# Patient Record
Sex: Female | Born: 1995 | Race: White | Hispanic: No | Marital: Married | Smoking: Never smoker
Health system: Southern US, Community
[De-identification: ages and names within clinical notes are randomized; demographics above are authoritative.]

## PROBLEM LIST (undated history)

## (undated) ENCOUNTER — Inpatient Hospital Stay (HOSPITAL_COMMUNITY): Payer: Self-pay

## (undated) DIAGNOSIS — D649 Anemia, unspecified: Secondary | ICD-10-CM

## (undated) DIAGNOSIS — E282 Polycystic ovarian syndrome: Secondary | ICD-10-CM

## (undated) DIAGNOSIS — E039 Hypothyroidism, unspecified: Secondary | ICD-10-CM

## (undated) DIAGNOSIS — F32A Depression, unspecified: Secondary | ICD-10-CM

## (undated) DIAGNOSIS — O3430 Maternal care for cervical incompetence, unspecified trimester: Secondary | ICD-10-CM

## (undated) DIAGNOSIS — F329 Major depressive disorder, single episode, unspecified: Secondary | ICD-10-CM

## (undated) DIAGNOSIS — O139 Gestational [pregnancy-induced] hypertension without significant proteinuria, unspecified trimester: Secondary | ICD-10-CM

## (undated) DIAGNOSIS — F419 Anxiety disorder, unspecified: Secondary | ICD-10-CM

## (undated) HISTORY — PX: CERVICAL CERCLAGE: SHX1329

## (undated) HISTORY — DX: Anxiety disorder, unspecified: F41.9

## (undated) HISTORY — DX: Depression, unspecified: F32.A

## (undated) HISTORY — PX: OTHER SURGICAL HISTORY: SHX169

## (undated) HISTORY — DX: Major depressive disorder, single episode, unspecified: F32.9

---
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
## 2015-03-19 ENCOUNTER — Encounter
Admit: 2015-03-19 | Disposition: A | Discharge status: Home / Self Care | Payer: Self-pay | Attending: Maternal & Fetal Medicine | Admitting: Maternal & Fetal Medicine

## 2015-03-29 NOTE — Consult Note (Signed)
Referral Information:  Reason for Referral 19 yo gravida 2 para 0111 is referred for preconceptional consultation regarding her history of 26 week preterm delivery.    At [redacted] weeks gestation in that pregnancy, the patient presented to [facility] in [place], [place], with a 2 day history of cramping.  On US, there was funneling with no measurable cervix.  The patient underwent rescue cerclage.  2 days later she ruptured her membranes.  She ultimately delivered at [redacted] weeks gestation.  Her daughter spent 5 and 1/2 months in the NICU, but has little residual - some chronic lung disease.   Referring Physician Dr. Nelson Bingharlie Pickens   Past Obstetrical Hx 03/19/2012 - Sab at "4 weeks" gestation.  No US confirmation.  No D & C 09/20/2015 - spontaneous vaginal delivery at [redacted] weeks gestation of 1'13oz (822 g) female infant.   Home Medications: Medication Instructions Status  metFORMIN 500 mg oral tablet 1 tab(s) orally 2 times a day Active  multivitamin, prenatal 1   once a day Active   Vital Signs/Notes:  Nursing Vital Signs: **Vital Signs.:   21-Apr-16 13:23  Vital Signs Type Routine  Temperature Temperature (F) 98.2  Celsius 36.7  Pulse Pulse 89  Respirations Respirations 18  Systolic BP Systolic BP 123  Diastolic BP (mmHg) Diastolic BP (mmHg) 82  Mean BP 95  Pulse Ox % Pulse Ox % 100  Pulse Ox Activity Level  At rest  Oxygen Delivery Room Air/ 21 %   Perinatal Consult:  PGyn Hx PCOS, oligomenorrhea, hirsutism   Past Medical History cont'd Thyroid disease.  TSH 6.41 on 01/15/2015.  Free T4 was 1.34.  03/03/2015 - TSH was 2.11 Seen by Dr. Tedd SiasSolum yesterday.  TPO antibody ordered  PCOS - Dr. Tedd SiasSolum started the patient on metformin 500 mg po bid yesterday. 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 History of 26 week preterm delivery.  Preterm cervical dilation, but after 2 days of cramping.  PPROM after rescue cerclage 2. PCOS on metformin 3. Obesity with BMI of 32 4. Euthyroid now, followed by Dr. Tedd SiasSolum, with TPO antibody pending   Recommendations 1. History of 26 week preterm delivery.  Preterm cervical dilation, but after 2 days of cramping.  PPROM after rescue cerclage -- I have recommended a uterine cavity evaluation.  Ideally, by sonohysterography (SIS).  Alternatively, by hysterosalpingogram.  I have left a message for Dr. Vergie LivingPickens to call me about the possibility of having this done at [facility].  Alternatively, [facility]. -- In a future pregnancy, I believe that a prophylactic cerclage between 13-14 weeks would be optional.  It is not clear whether cervical incompetence contributed  to her preterm delivery.  She will think about this. -- In a future pregnancy, whether or not she has a prophylactic cerclage, I would recommend serial endovaginal cervical length measurements starting at [redacted] weeks gestation and continuing until [redacted] weeks gestation.   -- We will be happy to see her again during the first trimester of a future pregnancy to review our recommendations -- In a future pregnancy, I would recommend 17OHP injections starting at 16 weeks and continuing until [redacted] weeks  gestation. 2. PCOS on metformin -- I would recommend she remain on metformin after conception -- I would recommend early and routine glucose screening in a future pregnancy 3. Obesity with BMI of 32 -- The patient was counseled by Dr. Tedd Sias and again by me about trying to lose weight  -- During a future pregnancy, weight gain should be limited to 10-15 lbs. 4. Euthyroid now, followed by Dr. Tedd Sias, with TPO antibody pending   Plan:  Comment/Plan Thank you for allowing [place] to participate in her care    Total Time Spent with Patient 45 minutes   >50% of visit spent in couseling/coordination of care yes   Office Use Only [address]  Level 3 ( ) NEW office consult detailed   Coding Description: MATERNAL CONDITIONS/HISTORY INDICATION(S).   History of prior preg w/ preterm labor or complicated by PTL.  Electronic Signatures: Lady Deutscher (MD)  (Signed 21-Apr-16 17:46)  Authored: Referral, Home Medications, Vital Signs/Notes, Consult, Exam, Impression, Plan, Billing, Coding Description   Last Updated: 21-Apr-16 17:46 by Lady Deutscher (MD)

## 2015-05-05 ENCOUNTER — Encounter (HOSPITAL_COMMUNITY): Payer: Self-pay | Admitting: Emergency Medicine

## 2015-05-05 ENCOUNTER — Emergency Department (HOSPITAL_COMMUNITY)
Admission: EM | Admit: 2015-05-05 | Discharge: 2015-05-05 | Disposition: A | Discharge status: Home / Self Care | Payer: Self-pay | Attending: Emergency Medicine | Admitting: Emergency Medicine

## 2015-05-05 DIAGNOSIS — Z8639 Personal history of other endocrine, nutritional and metabolic disease: Secondary | ICD-10-CM | POA: Insufficient documentation

## 2015-05-05 DIAGNOSIS — J039 Acute tonsillitis, unspecified: Secondary | ICD-10-CM | POA: Insufficient documentation

## 2015-05-05 HISTORY — DX: Polycystic ovarian syndrome: E28.2

## 2015-05-05 MED ORDER — AMOXICILLIN 500 MG PO CAPS: 500.0000 mg | ORAL_CAPSULE | Freq: Three times a day (TID) | ORAL | Status: DC

## 2015-05-05 NOTE — ED Provider Notes (Signed)
CSN: [id number]     Arrival date & time 05/05/15  1102 History   First MD Initiated Contact with Patient 05/05/15 1116     Chief Complaint  Patient presents with  . Sore Throat     (Consider location/radiation/quality/duration/timing/severity/associated sxs/prior Treatment) Patient is a 19 y.o. female presenting with pharyngitis. The history is provided by the patient. No language interpreter was used.  Sore Throat This is a new problem. The current episode started yesterday. The problem occurs constantly. The problem has been gradually worsening. Associated symptoms include a sore throat and swollen glands. Nothing aggravates the symptoms. She has tried nothing for the symptoms. The treatment provided moderate relief.    Past Medical History  Diagnosis Date  . PCOS (polycystic ovarian syndrome)    History reviewed. No pertinent past surgical history. History reviewed. No pertinent family history. History  Substance Use Topics  . Smoking status: Never Smoker   . Smokeless tobacco: Not on file  . Alcohol Use: No   OB History    Gravida Para Term Preterm AB TAB SAB Ectopic Multiple Living            1     Review of Systems  HENT: Positive for sore throat.   All other systems reviewed and are negative.     Allergies  Review of patient's allergies indicates no known allergies.  Home Medications   Prior to Admission medications   Medication Sig Start Date End Date Taking? Authorizing Provider  pseudoephedrine-acetaminophen (TYLENOL SINUS) 30-500 MG TABS Take 1 tablet by mouth every 4 (four) hours as needed (sinus congestion).   Yes Historical Provider, MD  amoxicillin (AMOXIL) 500 MG capsule Take 1 capsule (500 mg total) by mouth 3 (three) times daily. 05/05/15   Elson AreasLeslie K Sofia, PA-C   BP 114/79 mmHg  Pulse 118  Temp(Src) 98.3 F (36.8 C) (Oral)  Resp 18  Ht 4\' 11"  (1.499 m)  Wt 150 lb (68.04 kg)  BMI 30.28 kg/m2  SpO2 99%  LMP 04/21/2015 Physical Exam   Constitutional: She is oriented to person, place, and time.  HENT:  Head: Normocephalic.  Right Ear: External ear normal.  Left Ear: External ear normal.  Erythema tonsils and pharynx.  Eyes: Pupils are equal, round, and reactive to light.  Neck: Normal range of motion.  Cardiovascular: Normal rate.   Pulmonary/Chest: Effort normal.  Abdominal: Soft.  Musculoskeletal: Normal range of motion.  Neurological: She is alert and oriented to person, place, and time. She has normal reflexes.  Skin: Skin is warm.  Psychiatric: She has a normal mood and affect.  Nursing note and vitals reviewed.   ED Course  Procedures (including critical care time) Labs Review Labs Reviewed - No data to display  Imaging Review No results found.   EKG Interpretation None      MDM  Co worker had strep   Final diagnoses:  Tonsillitis    amoxicillian AVS    Lonia SkinnerLeslie K North GatesSofia, PA-C 05/05/15 1133  Gilda Creasehristopher J Pollina, MD 05/05/15 1320

## 2015-05-05 NOTE — ED Notes (Signed)
Sore throat since yesterday. With body aches. No NVD, Throat is red and pt sounds sl hoarse.

## 2015-05-05 NOTE — ED Notes (Signed)
PT c/o sore throat x1 day with body aches.

## 2015-05-05 NOTE — Discharge Instructions (Signed)

## 2015-11-25 ENCOUNTER — Emergency Department
Admission: EM | Admit: 2015-11-25 | Discharge: 2015-11-25 | Disposition: A | Discharge status: Home / Self Care | Payer: Self-pay | Attending: Emergency Medicine | Admitting: Emergency Medicine

## 2015-11-25 ENCOUNTER — Emergency Department: Payer: Self-pay

## 2015-11-25 DIAGNOSIS — Z792 Long term (current) use of antibiotics: Secondary | ICD-10-CM | POA: Insufficient documentation

## 2015-11-25 DIAGNOSIS — J029 Acute pharyngitis, unspecified: Secondary | ICD-10-CM

## 2015-11-25 DIAGNOSIS — J40 Bronchitis, not specified as acute or chronic: Secondary | ICD-10-CM | POA: Insufficient documentation

## 2015-11-25 DIAGNOSIS — R51 Headache: Secondary | ICD-10-CM

## 2015-11-25 DIAGNOSIS — R519 Headache, unspecified: Secondary | ICD-10-CM

## 2015-11-25 LAB — POCT RAPID STREP A: STREPTOCOCCUS, GROUP A SCREEN (DIRECT): NEGATIVE

## 2015-11-25 MED ORDER — BUTALBITAL-APAP-CAFFEINE 50-325-40 MG PO TABS
2.0000 | ORAL_TABLET | Freq: Once | ORAL | Status: AC
  Administered 2015-11-25: 2 via ORAL
  Filled 2015-11-25: qty 2

## 2015-11-25 MED ORDER — BUTALBITAL-APAP-CAFFEINE 50-325-40 MG PO TABS: 1.0000 | ORAL_TABLET | Freq: Four times a day (QID) | ORAL | Status: DC | PRN

## 2015-11-25 MED ORDER — BENZONATATE 100 MG PO CAPS: 100.0000 mg | ORAL_CAPSULE | Freq: Four times a day (QID) | ORAL | Status: DC | PRN

## 2015-11-25 MED ORDER — BENZONATATE 100 MG PO CAPS
100.0000 mg | ORAL_CAPSULE | Freq: Once | ORAL | Status: AC
  Administered 2015-11-25: 100 mg via ORAL
  Filled 2015-11-25: qty 1

## 2015-11-25 MED ORDER — ALBUTEROL SULFATE HFA 108 (90 BASE) MCG/ACT IN AERS: 2.0000 | INHALATION_SPRAY | Freq: Four times a day (QID) | RESPIRATORY_TRACT | Status: DC | PRN

## 2015-11-25 MED ORDER — KETOROLAC TROMETHAMINE 60 MG/2ML IM SOLN
60.0000 mg | Freq: Once | INTRAMUSCULAR | Status: AC
  Administered 2015-11-25: 60 mg via INTRAMUSCULAR
  Filled 2015-11-25: qty 2

## 2015-11-25 MED ORDER — IPRATROPIUM-ALBUTEROL 0.5-2.5 (3) MG/3ML IN SOLN
3.0000 mL | Freq: Once | RESPIRATORY_TRACT | Status: AC
  Administered 2015-11-25: 3 mL via RESPIRATORY_TRACT
  Filled 2015-11-25: qty 3

## 2015-11-25 NOTE — ED Notes (Signed)
Pt in with cough, congestion, bodyaches, headaches, since Friday.

## 2015-11-25 NOTE — ED Notes (Signed)
Patient transported to X-ray 

## 2015-11-25 NOTE — Discharge Instructions (Signed)
General Headache Without Cause A headache is pain or discomfort felt around the head or neck area. The specific cause of a headache may not be found. There are many causes and types of headaches. A few common ones are:  Tension headaches.  Migraine headaches.  Cluster headaches.  Chronic daily headaches. HOME CARE INSTRUCTIONS  Watch your condition for any changes. Take these steps to help with your condition: Managing Pain  Take over-the-counter and prescription medicines only as told by your health care provider.  Lie down in a dark, quiet room when you have a headache.  If directed, apply ice to the head and neck area:  Put ice in a plastic bag.  Place a towel between your skin and the bag.  Leave the ice on for 20 minutes, 2-3 times per day.  Use a heating pad or hot shower to apply heat to the head and neck area as told by your health care provider.  Keep lights dim if bright lights bother you or make your headaches worse. Eating and Drinking  Eat meals on a regular schedule.  Limit alcohol use.  Decrease the amount of caffeine you drink, or stop drinking caffeine. General Instructions  Keep all follow-up visits as told by your health care provider. This is important.  Keep a headache journal to help find out what may trigger your headaches. For example, write down:  What you eat and drink.  How much sleep you get.  Any change to your diet or medicines.  Try massage or other relaxation techniques.  Limit stress.  Sit up straight, and do not tense your muscles.  Do not use tobacco products, including cigarettes, chewing tobacco, or e-cigarettes. If you need help quitting, ask your health care provider.  Exercise regularly as told by your health care provider.  Sleep on a regular schedule. Get 7-9 hours of sleep, or the amount recommended by your health care provider. SEEK MEDICAL CARE IF:   Your symptoms are not helped by medicine.  You have a  headache that is different from the usual headache.  You have nausea or you vomit.  You have a fever. SEEK IMMEDIATE MEDICAL CARE IF:   Your headache becomes severe.  You have repeated vomiting.  You have a stiff neck.  You have a loss of vision.  You have problems with speech.  You have pain in the eye or ear.  You have muscular weakness or loss of muscle control.  You lose your balance or have trouble walking.  You feel faint or pass out.  You have confusion.   This information is not intended to replace advice given to you by your health care provider. Make sure you discuss any questions you have with your health care provider.   Document Released: 11/14/2005 Document Revised: 08/05/2015 Document Reviewed: 03/09/2015 Elsevier Interactive Patient Education 2016 Elsevier Inc.  Pharyngitis Pharyngitis is a sore throat (pharynx). There is redness, pain, and swelling of your throat. HOME CARE   Drink enough fluids to keep your pee (urine) clear or pale yellow.  Only take medicine as told by your doctor.  You may get sick again if you do not take medicine as told. Finish your medicines, even if you start to feel better.  Do not take aspirin.  Rest.  Rinse your mouth (gargle) with salt water ( tsp of salt per 1 qt of water) every 1-2 hours. This will help the pain.  If you are not at risk for choking, you  can suck on hard candy or sore throat lozenges. GET HELP IF:  You have large, tender lumps on your neck.  You have a rash.  You cough up green, yellow-brown, or bloody spit. GET HELP RIGHT AWAY IF:   You have a stiff neck.  You drool or cannot swallow liquids.  You throw up (vomit) or are not able to keep medicine or liquids down.  You have very bad pain that does not go away with medicine.  You have problems breathing (not from a stuffy nose). MAKE SURE YOU:   Understand these instructions.  Will watch your condition.  Will get help right away  if you are not doing well or get worse.   This information is not intended to replace advice given to you by your health care provider. Make sure you discuss any questions you have with your health care provider.   Document Released: 05/02/2008 Document Revised: 09/04/2013 Document Reviewed: 07/22/2013 Elsevier Interactive Patient Education 2016 Elsevier Inc.  Upper Respiratory Infection, Adult Most upper respiratory infections (URIs) are caused by a virus. A URI affects the nose, throat, and upper air passages. The most common type of URI is often called "the common cold." HOME CARE   Take medicines only as told by your doctor.  Gargle warm saltwater or take cough drops to comfort your throat as told by your doctor.  Use a warm mist humidifier or inhale steam from a shower to increase air moisture. This may make it easier to breathe.  Drink enough fluid to keep your pee (urine) clear or pale yellow.  Eat soups and other clear broths.  Have a healthy diet.  Rest as needed.  Go back to work when your fever is gone or your doctor says it is okay.  You may need to stay home longer to avoid giving your URI to others.  You can also wear a face mask and wash your hands often to prevent spread of the virus.  Use your inhaler more if you have asthma.  Do not use any tobacco products, including cigarettes, chewing tobacco, or electronic cigarettes. If you need help quitting, ask your doctor. GET HELP IF:  You are getting worse, not better.  Your symptoms are not helped by medicine.  You have chills.  You are getting more short of breath.  You have brown or red mucus.  You have yellow or brown discharge from your nose.  You have pain in your face, especially when you bend forward.  You have a fever.  You have puffy (swollen) neck glands.  You have pain while swallowing.  You have white areas in the back of your throat. GET HELP RIGHT AWAY IF:   You have very bad or  constant:  Headache.  Ear pain.  Pain in your forehead, behind your eyes, and over your cheekbones (sinus pain).  Chest pain.  You have long-lasting (chronic) lung disease and any of the following:  Wheezing.  Long-lasting cough.  Coughing up blood.  A change in your usual mucus.  You have a stiff neck.  You have changes in your:  Vision.  Hearing.  Thinking.  Mood. MAKE SURE YOU:   Understand these instructions.  Will watch your condition.  Will get help right away if you are not doing well or get worse.   This information is not intended to replace advice given to you by your health care provider. Make sure you discuss any questions you have with your health care  provider.   Document Released: 05/02/2008 Document Revised: 03/31/2015 Document Reviewed: 02/19/2014 Elsevier Interactive Patient Education [employer] Elsevier Inc.

## 2015-11-25 NOTE — ED Provider Notes (Signed)
[facility] Emergency Department Provider Note  ____________________________________________  Time seen: Approximately 6:22 AM  I have reviewed the triage vital signs and the nursing notes.   HISTORY  Chief Complaint Cough    HPI Tara Mcmahon is a 19 y.o. female who comes into the hospital today with cough congestion body aches and headaches. The patient reports that she's been having the symptoms since Friday which was approximately 5 days ago. She reports that she's been having coughing that is giving her chest pain when she coughs. The patient is coughing up some intermittent productive yellowish-green sputum. The patient is also had some headache and dizziness with hot flashes. The patient reports that she's felt warm but she has not taken her temperature so she is unsure she's had a fever. The patient has used Chloraseptic to ease her throat painbut has not taken any other medication. The patient reports that her daughter was sick recently. The patient reports that her headache and throat pain or 4 out of 10 in intensity. The patient has also had some shortness of breath with no vomiting. The patient was unsure what to do so she decided to come into the hospital for evaluation.   Past Medical History  Diagnosis Date  . PCOS (polycystic ovarian syndrome)     There are no active problems to display for this patient.   No past surgical history on file.  Current Outpatient Rx  Name  Route  Sig  Dispense  Refill  . albuterol (PROVENTIL HFA;VENTOLIN HFA) 108 (90 Base) MCG/ACT inhaler   Inhalation   Inhale 2 puffs into the lungs every 6 (six) hours as needed.   1 Inhaler   0   . amoxicillin (AMOXIL) 500 MG capsule   Oral   Take 1 capsule (500 mg total) by mouth 3 (three) times daily.   21 capsule   0   . benzonatate (TESSALON PERLES) 100 MG capsule   Oral   Take 1 capsule (100 mg total) by mouth every 6 (six) hours as needed for cough.   15  capsule   0   . butalbital-acetaminophen-caffeine (FIORICET) 50-325-40 MG tablet   Oral   Take 1-2 tablets by mouth every 6 (six) hours as needed for headache.   20 tablet   0   . pseudoephedrine-acetaminophen (TYLENOL SINUS) 30-500 MG TABS   Oral   Take 1 tablet by mouth every 4 (four) hours as needed (sinus congestion).           Allergies Review of patient's allergies indicates no known allergies.  No family history on file.  Social History Social History  Substance Use Topics  . Smoking status: Never Smoker   . Smokeless tobacco: Not on file  . Alcohol Use: No    Review of Systems Constitutional: Hot and cold flashes Eyes: No visual changes. ENT:  sore throat. Cardiovascular: chest pain. Respiratory: Cough and shortness of breath. Gastrointestinal: Nausea with No abdominal pain.   no vomiting.  No diarrhea.  No constipation. Genitourinary: Negative for dysuria. Musculoskeletal: Negative for back pain. Skin: Negative for rash. Neurological: Headache and dizziness  10-point ROS otherwise negative.  ____________________________________________   PHYSICAL EXAM:  VITAL SIGNS: ED Triage Vitals  Enc Vitals Group     BP 11/25/15 0611 120/78 mmHg     Pulse Rate 11/25/15 0611 94     Resp 11/25/15 0611 18     Temp 11/25/15 0611 98.1 F (36.7 C)     Temp Source 11/25/15  [id number] Oral     SpO2 11/25/15 0611 100 %     Weight 11/25/15 0611 160 lb (72.576 kg)     Height 11/25/15 0611 4\' 11"  (1.499 m)     Head Cir --      Peak Flow --      Pain Score 11/25/15 0611 3     Pain Loc --      Pain Edu? --      Excl. in GC? --     Constitutional: Alert and oriented. Well appearing and in mild distress. Eyes: Conjunctivae are normal. PERRL. EOMI. Head: Atraumatic. Nose: No congestion/rhinnorhea. Mouth/Throat: Mucous membranes are moist.  Oropharynx mildly erythematous. Cardiovascular: Normal rate, regular rhythm. Grossly normal heart sounds.  Good peripheral  circulation. Respiratory: Normal respiratory effort.  Coarse expiratory breath sounds throughout all lung fields. Gastrointestinal: Soft and nontender. No distention. Positive bowel sounds Musculoskeletal: No lower extremity tenderness nor edema.   Neurologic:  Normal speech and language.  Skin:  Skin is warm, dry and intact.  Psychiatric: Mood and affect are normal.   ____________________________________________   LABS (all labs ordered are listed, but only abnormal results are displayed)  Labs Reviewed  CULTURE, GROUP A STREP ([facility] ONLY)  POCT RAPID STREP A   ____________________________________________  EKG  None ____________________________________________  RADIOLOGY  Chest x-ray ____________________________________________   PROCEDURES  Procedure(s) performed: None  Critical Care performed: No  ____________________________________________   INITIAL IMPRESSION / ASSESSMENT AND PLAN / ED COURSE  Pertinent labs & imaging results that were available during my care of the patient were reviewed by me and considered in my medical decision making (see chart for details).  This is a 19 year old female who comes into the hospital today with some cough body aches headache and congestion. The patient has not been taking anything for the symptoms. I will give the patient a dose of Toradol, Fioricet and benzonatate. I will also give the patient a breathing treatment as she has some coarse breath sounds. I will check a chest x-ray and a strep test. I will reassess the patient when she's had her medications.  The patient's care will be signed out to Dr. Derrill KayGoodman who will follow up the results of the patient's CXR and reassess her symptoms. ____________________________________________   FINAL CLINICAL IMPRESSION(S) / ED DIAGNOSES  Final diagnoses:  Bronchitis  Pharyngitis  Acute nonintractable headache, unspecified headache type      Rebecka ApleyAllison P Webster, MD 11/25/15  [phone]

## 2015-11-27 LAB — CULTURE, GROUP A STREP (THRC)

## 2015-11-29 NOTE — L&D Delivery Note (Deleted)
Delivery Note At 9:49 PM a viable female was delivered via Vaginal, Spontaneous Delivery (Presentation: cephalic; OA).  APGAR: 6, 7; weight pending.   Placenta status: intact, delivered spontaneously.  Cord: 3 vessel with the following complications: none.  Cord pH: 7.11  Anesthesia: Epidural; local during repair Episiotomy: None Lacerations: 1st degree;Vaginal Suture Repair: 3.0 vicryl Est. Blood Loss (mL): 150  Mom to postpartum.  Baby to Couplet care / Skin to Skin.  Tara Mcmahon 08/30/2016, 10:40 PM

## 2015-11-29 NOTE — L&D Delivery Note (Signed)
Delivery Note At 9:49 PM a viable female was delivered via Vaginal, Spontaneous Delivery (Presentation: cephalic; OA).  APGAR: 6, 7; weight pending .   Placenta status: spontaneous, intact.  Cord: 3 vessel with the following complications: none.  Cord pH: 7.11  Anesthesia: Epidural Episiotomy: None Lacerations: 1st degree;Vaginal Suture Repair: 3.0 vicryl Est. Blood Loss (mL): 150  Mom to postpartum.  Baby to Couplet care / Skin to Skin.  Jinny BlossomKaty D Mayo 08/30/2016, 10:45 PM   OB FELLOW DELIVERY ATTESTATION  I was gloved and present for the delivery in its entirety, and I agree with the above resident's note.    Jen MowElizabeth Mumaw, DO OB Fellow

## 2015-12-27 ENCOUNTER — Emergency Department (HOSPITAL_COMMUNITY)
Admission: EM | Admit: 2015-12-27 | Discharge: 2015-12-27 | Disposition: A | Discharge status: Home / Self Care | Payer: Self-pay | Attending: Emergency Medicine | Admitting: Emergency Medicine

## 2015-12-27 ENCOUNTER — Encounter (HOSPITAL_COMMUNITY): Payer: Self-pay

## 2015-12-27 ENCOUNTER — Emergency Department (HOSPITAL_COMMUNITY): Payer: Self-pay

## 2015-12-27 DIAGNOSIS — R102 Pelvic and perineal pain: Secondary | ICD-10-CM

## 2015-12-27 DIAGNOSIS — Z3A01 Less than 8 weeks gestation of pregnancy: Secondary | ICD-10-CM | POA: Insufficient documentation

## 2015-12-27 DIAGNOSIS — R079 Chest pain, unspecified: Secondary | ICD-10-CM | POA: Insufficient documentation

## 2015-12-27 DIAGNOSIS — R51 Headache: Secondary | ICD-10-CM | POA: Insufficient documentation

## 2015-12-27 DIAGNOSIS — R112 Nausea with vomiting, unspecified: Secondary | ICD-10-CM | POA: Insufficient documentation

## 2015-12-27 DIAGNOSIS — R109 Unspecified abdominal pain: Secondary | ICD-10-CM | POA: Insufficient documentation

## 2015-12-27 DIAGNOSIS — O26899 Other specified pregnancy related conditions, unspecified trimester: Secondary | ICD-10-CM

## 2015-12-27 DIAGNOSIS — R1031 Right lower quadrant pain: Secondary | ICD-10-CM

## 2015-12-27 DIAGNOSIS — Z79899 Other long term (current) drug therapy: Secondary | ICD-10-CM | POA: Insufficient documentation

## 2015-12-27 DIAGNOSIS — O200 Threatened abortion: Secondary | ICD-10-CM | POA: Insufficient documentation

## 2015-12-27 DIAGNOSIS — M545 Low back pain: Secondary | ICD-10-CM | POA: Insufficient documentation

## 2015-12-27 DIAGNOSIS — Z8639 Personal history of other endocrine, nutritional and metabolic disease: Secondary | ICD-10-CM | POA: Insufficient documentation

## 2015-12-27 LAB — CBC WITH DIFFERENTIAL/PLATELET
BASOS PCT: 0 %
Basophils Absolute: 0 10*3/uL (ref 0.0–0.1)
Eosinophils Absolute: 0.1 10*3/uL (ref 0.0–0.7)
Eosinophils Relative: 1 %
HCT: 35.8 % — ABNORMAL LOW (ref 36.0–46.0)
Hemoglobin: 12.4 g/dL (ref 12.0–15.0)
Lymphocytes Relative: 20 %
Lymphs Abs: 1.9 10*3/uL (ref 0.7–4.0)
MCH: 29.2 pg (ref 26.0–34.0)
MCHC: 34.6 g/dL (ref 30.0–36.0)
MCV: 84.4 fL (ref 78.0–100.0)
MONO ABS: 0.6 10*3/uL (ref 0.1–1.0)
MONOS PCT: 6 %
NEUTROS PCT: 73 %
Neutro Abs: 7.1 10*3/uL (ref 1.7–7.7)
Platelets: 325 10*3/uL (ref 150–400)
RBC: 4.24 MIL/uL (ref 3.87–5.11)
RDW: 12.2 % (ref 11.5–15.5)
WBC: 9.7 10*3/uL (ref 4.0–10.5)

## 2015-12-27 LAB — URINALYSIS, ROUTINE W REFLEX MICROSCOPIC
Bilirubin Urine: NEGATIVE
GLUCOSE, UA: NEGATIVE mg/dL
Hgb urine dipstick: NEGATIVE
KETONES UR: NEGATIVE mg/dL
Leukocytes, UA: NEGATIVE
Nitrite: NEGATIVE
PROTEIN: NEGATIVE mg/dL
Specific Gravity, Urine: 1.02 (ref 1.005–1.030)
pH: 7 (ref 5.0–8.0)

## 2015-12-27 LAB — BASIC METABOLIC PANEL
Anion gap: 7 (ref 5–15)
BUN: 15 mg/dL (ref 6–20)
CALCIUM: 9 mg/dL (ref 8.9–10.3)
CO2: 25 mmol/L (ref 22–32)
Chloride: 105 mmol/L (ref 101–111)
Creatinine, Ser: 0.73 mg/dL (ref 0.44–1.00)
GFR calc non Af Amer: >60 mL/min (ref >60)
GLUCOSE: 112 mg/dL — AB (ref 65–99)
Potassium: 3.7 mmol/L (ref 3.5–5.1)
Sodium: 137 mmol/L (ref 135–145)

## 2015-12-27 LAB — HCG, QUANTITATIVE, PREGNANCY: HCG, BETA CHAIN, QUANT, S: 121 m[IU]/mL — AB (ref <5)

## 2015-12-27 LAB — POC URINE PREG, ED: Preg Test, Ur: POSITIVE — AB

## 2015-12-27 NOTE — Discharge Instructions (Signed)
Return for new or worse symptoms. Make an appointment to follow-up with OB/GYN. Continue prenatal vitamins.

## 2015-12-27 NOTE — ED Provider Notes (Signed)
CSN: [id number]     Arrival date & time 12/27/15  1021 History  By signing my name below, I, Gonzella Lex, attest that this documentation has been prepared under the direction and in the presence of Vanetta Mulders, MD. Electronically Signed: Gonzella Lex, Scribe. 12/27/2015. 11:12 AM.   Chief Complaint  Patient presents with  . Abdominal Pain   The history is provided by the patient. No language interpreter was used.   HPI Comments: Tara Mcmahon is a 20 y.o. female with a hx of PCOS, who is currently about [redacted] weeks pregnant and has a GPA score of 3-1-1 who presents to the Emergency Department complaining of sudden onset, 7/10, sharp RLQ abdominal pain which began three days ago. Pt also notes associated nausea, vomiting, HA, chest pain, and mild lower back pain. Her LNMP was  December 3rd, 2016. Pt reported to the ED at [facility] two days ago where they ordered a quantitative HCG which was read to be 70. They did not perform an ultrasound. Pt was given medication for her nausea which she does not take. She has instead been eating mints which mildly relieve her nausea. She also notes that she has been taking prenatal vitamins. Pt has not seen an OBGYN for her PCOS due to lack of insurance. She denies fever, cough, sore throat, rhinorrhea, vaginal discharge, vaginal bleeding, rash, hematuria, dysuria, leg swelling, SOB, and diarrhea.   Past Medical History  Diagnosis Date  . PCOS (polycystic ovarian syndrome)    History reviewed. No pertinent past surgical history. No family history on file. Social History  Substance Use Topics  . Smoking status: Never Smoker   . Smokeless tobacco: None  . Alcohol Use: No   OB History    Gravida Para Term Preterm AB TAB SAB Ectopic Multiple Living   Review of Systems  Constitutional: Negative for fever and chills.  HENT: Negative for rhinorrhea and sore throat.   Eyes: Negative for visual disturbance.  Respiratory:  Negative for cough and shortness of breath.   Cardiovascular: Positive for chest pain. Negative for leg swelling.  Gastrointestinal: Positive for nausea, vomiting and abdominal pain. Negative for diarrhea.  Genitourinary: Negative for dysuria, hematuria, vaginal bleeding and vaginal discharge.  Musculoskeletal: Positive for back pain.  Skin: Negative for rash.  Neurological: Positive for headaches.  Hematological: Does not bruise/bleed easily.  Psychiatric/Behavioral: Negative for confusion.  All other systems reviewed and are negative.  Allergies  Review of patient's allergies indicates no known allergies.  Home Medications   Prior to Admission medications   Medication Sig Start Date End Date Taking? Authorizing Provider  Prenatal Vit-Fe Fumarate-FA (PRENATAL MULTIVITAMIN) TABS tablet Take 1 tablet by mouth daily at 12 noon.   Yes Historical Provider, MD  albuterol (PROVENTIL HFA;VENTOLIN HFA) 108 (90 Base) MCG/ACT inhaler Inhale 2 puffs into the lungs every 6 (six) hours as needed. Patient not taking: Reported on 12/27/2015 11/25/15   Rebecka Apley, MD  benzonatate (TESSALON PERLES) 100 MG capsule Take 1 capsule (100 mg total) by mouth every 6 (six) hours as needed for cough. Patient not taking: Reported on 12/27/2015 11/25/15   Rebecka Apley, MD  butalbital-acetaminophen-caffeine (FIORICET) [phone] MG tablet Take 1-2 tablets by mouth every 6 (six) hours as needed for headache. Patient not taking: Reported on 12/27/2015 11/25/15 11/24/16  Rebecka Apley, MD   BP 128/69 mmHg  Pulse 102  Temp(Src) 98.5 F (  36.9 C) (Oral)  Resp 18  Ht  (1.499 m)  Wt 72.576 kg  BMI 32.30 kg/m2  SpO2 99%  LMP 10/31/2015 Physical Exam  Constitutional: She is oriented to person, place, and time. She appears well-developed and well-nourished. No distress.  HENT:  Head: Normocephalic and atraumatic.  Mouth/Throat: Oropharynx is clear and moist.  Mucous membranes are moist.   Eyes:  Conjunctivae and EOM are normal. Pupils are equal, round, and reactive to light. No scleral icterus.  Cardiovascular: Normal rate, regular rhythm and normal heart sounds.   Pulmonary/Chest: Effort normal and breath sounds normal.  Lungs clear bilaterally  Abdominal: Soft. Bowel sounds are normal. She exhibits no distension. There is no tenderness.  Musculoskeletal: She exhibits no edema.  No swelling in ankles  Neurological: She is alert and oriented to person, place, and time. She has normal reflexes. No cranial nerve deficit. She exhibits normal muscle tone. Coordination normal.  Skin: Skin is warm and dry.  Psychiatric: She has a normal mood and affect.  Nursing note and vitals reviewed.   ED Course  Procedures  DIAGNOSTIC STUDIES:    Oxygen Saturation is 99% on RA, normal by my interpretation.   COORDINATION OF CARE:  10:36 AM Will order ultra sound and quantitative HCG. Discussed treatment plan with pt at bedside and pt agreed to plan.   Results for orders placed or performed during the hospital encounter of 12/27/15  Urinalysis, Routine w reflex microscopic (not at [facility])  Result Value Ref Range   Color, Urine YELLOW YELLOW   APPearance CLEAR CLEAR   Specific Gravity, Urine 1.020 1.005 - 1.030   pH 7.0 5.0 - 8.0   Glucose, UA NEGATIVE NEGATIVE mg/dL   Hgb urine dipstick NEGATIVE NEGATIVE   Bilirubin Urine NEGATIVE NEGATIVE   Ketones, ur NEGATIVE NEGATIVE mg/dL   Protein, ur NEGATIVE NEGATIVE mg/dL   Nitrite NEGATIVE NEGATIVE   Leukocytes, UA NEGATIVE NEGATIVE  Basic metabolic panel  Result Value Ref Range   Sodium 137 135 - 145 mmol/L   Potassium 3.7 3.5 - 5.1 mmol/L   Chloride 105 101 - 111 mmol/L   CO2 25 22 - 32 mmol/L   Glucose, Bld 112 (H) 65 - 99 mg/dL   BUN 15 6 - 20 mg/dL   Creatinine, Ser 1.61 0.44 - 1.00 mg/dL   Calcium 9.0 8.9 - 09.6 mg/dL   GFR calc non Af Amer >60 >60 mL/min   GFR calc Af Amer >60 >60 mL/min   Anion gap 7 5 - 15  CBC with  Differential/Platelet  Result Value Ref Range   WBC 9.7 4.0 - 10.5 K/uL   RBC 4.24 3.87 - 5.11 MIL/uL   Hemoglobin 12.4 12.0 - 15.0 g/dL   HCT 04.5 (L) 40.9 - 81.1 %   MCV 84.4 78.0 - 100.0 fL   MCH 29.2 26.0 - 34.0 pg   MCHC 34.6 30.0 - 36.0 g/dL   RDW 91.4 78.2 - 95.6 %   Platelets 325 150 - 400 K/uL   Neutrophils Relative % 73 %   Neutro Abs 7.1 1.7 - 7.7 K/uL   Lymphocytes Relative 20 %   Lymphs Abs 1.9 0.7 - 4.0 K/uL   Monocytes Relative 6 %   Monocytes Absolute 0.6 0.1 - 1.0 K/uL   Eosinophils Relative 1 %   Eosinophils Absolute 0.1 0.0 - 0.7 K/uL   Basophils Relative 0 %   Basophils Absolute 0.0 0.0 - 0.1 K/uL  hCG, quantitative, pregnancy  Result Value  Ref Range   hCG, Beta Chain, Quant, S 121 (H) <5 mIU/mL  POC urine preg, ED (not at [facility])  Result Value Ref Range   Preg Test, Ur POSITIVE (A) NEGATIVE   US Ob Comp Less 14 Wks  12/27/2015  CLINICAL DATA:  Positive pregnancy test, right lower quadrant pain for 3 days, quantitative beta HCG 121. EXAM: OBSTETRIC <14 WK [place] AND TRANSVAGINAL OB US TECHNIQUE: Both transabdominal and transvaginal ultrasound examinations were performed for complete evaluation of the gestation as well as the maternal uterus, adnexal regions, and pelvic cul-de-sac. Transvaginal technique was performed to assess early pregnancy. COMPARISON:  None. FINDINGS: Intrauterine gestational sac: Not visualized Yolk sac:  Not visualized Embryo:  Not visualized Cardiac Activity: Not detected Subchorionic hemorrhage:  None visualized. Maternal uterus/adnexae: No visualized intrauterine pregnancy or gestational sac. Endometrium is thickened measured 22 mm. Ovaries appear normal. Right ovary measures 4.1 x 2.7 x 2.6 cm. Left ovary measures 3.4 x 2.3 x 2.0 cm. Small amount of free fluid in the cul-de-sac. No definite adnexal abnormality. IMPRESSION: No visualized intrauterine pregnancy or gestational sac. Thickened endometrium measuring 22 mm. Normal ovaries without adnexal  abnormality. Trace pelvic free fluid. Electronically Signed   By: Judie Petit.  Shick M.D.   On: 12/27/2015 12:12   US Ob Transvaginal  12/27/2015  CLINICAL DATA:  Positive pregnancy test, right lower quadrant pain for 3 days, quantitative beta HCG 121. EXAM: OBSTETRIC <14 WK [place] AND TRANSVAGINAL OB US TECHNIQUE: Both transabdominal and transvaginal ultrasound examinations were performed for complete evaluation of the gestation as well as the maternal uterus, adnexal regions, and pelvic cul-de-sac. Transvaginal technique was performed to assess early pregnancy. COMPARISON:  None. FINDINGS: Intrauterine gestational sac: Not visualized Yolk sac:  Not visualized Embryo:  Not visualized Cardiac Activity: Not detected Subchorionic hemorrhage:  None visualized. Maternal uterus/adnexae: No visualized intrauterine pregnancy or gestational sac. Endometrium is thickened measured 22 mm. Ovaries appear normal. Right ovary measures 4.1 x 2.7 x 2.6 cm. Left ovary measures 3.4 x 2.3 x 2.0 cm. Small amount of free fluid in the cul-de-sac. No definite adnexal abnormality. IMPRESSION: No visualized intrauterine pregnancy or gestational sac. Thickened endometrium measuring 22 mm. Normal ovaries without adnexal abnormality. Trace pelvic free fluid. Electronically Signed   By: Judie Petit.  Shick M.D.   On: 12/27/2015 12:12     MDM   Final diagnoses:  Threatened miscarriage in early pregnancy    The patient's quantitative hCG from [facility] from 2 days ago was 70. Today it's up to 121 suggestive of a developing pregnancy very early on. No evidence of any embryo visualization on ultrasound. Patient currently nontoxic no acute distress. Does have abdominal pain but has no abdominal tenderness. No vaginal bleeding. Patient given precautions referral to OB/GYN. Patient's already on prenatal vitamins. Patient understands an ectopic pregnancy has not been ruled out.  I personally performed the services described in this documentation, which was  scribed in my presence. The recorded information has been reviewed and is accurate.      Vanetta Mulders, MD 12/27/15 1319

## 2015-12-27 NOTE — ED Notes (Signed)
Pt reports that she is [redacted] weeks pregnant and started having sharp right sided abdominal pain. No bleeding noted . Vomiting and nausea present

## 2015-12-31 ENCOUNTER — Emergency Department (HOSPITAL_COMMUNITY)
Admission: EM | Admit: 2015-12-31 | Discharge: 2015-12-31 | Disposition: A | Discharge status: Home / Self Care | Payer: Medicaid Other | Attending: Emergency Medicine | Admitting: Emergency Medicine

## 2015-12-31 ENCOUNTER — Encounter (HOSPITAL_COMMUNITY): Payer: Self-pay | Admitting: *Deleted

## 2015-12-31 DIAGNOSIS — Z79899 Other long term (current) drug therapy: Secondary | ICD-10-CM | POA: Insufficient documentation

## 2015-12-31 DIAGNOSIS — Z8639 Personal history of other endocrine, nutritional and metabolic disease: Secondary | ICD-10-CM | POA: Insufficient documentation

## 2015-12-31 DIAGNOSIS — Z3201 Encounter for pregnancy test, result positive: Secondary | ICD-10-CM | POA: Insufficient documentation

## 2015-12-31 DIAGNOSIS — Z09 Encounter for follow-up examination after completed treatment for conditions other than malignant neoplasm: Secondary | ICD-10-CM | POA: Diagnosis present

## 2015-12-31 DIAGNOSIS — Z3490 Encounter for supervision of normal pregnancy, unspecified, unspecified trimester: Secondary | ICD-10-CM

## 2015-12-31 LAB — HCG, QUANTITATIVE, PREGNANCY: HCG, BETA CHAIN, QUANT, S: 562 m[IU]/mL — AB (ref <5)

## 2015-12-31 NOTE — ED Provider Notes (Signed)
CSN: [id number]     Arrival date & time 12/31/15  1355 History   First MD Initiated Contact with Patient 12/31/15 1412     Chief Complaint  Patient presents with  . Follow-up     (Consider location/radiation/quality/duration/timing/severity/associated sxs/prior Treatment) The history is provided by the patient.   20 year old female seen by me on January 29. At that time patient was diagnosed with early pregnancy but ultrasound was not able to distinguish interuterine pregnancy. So patient will have precautions for possible ectopic or threatened miscarriage. Patient's symptoms have resolved no abdominal pain at this point in time. No vaginal bleeding. Patient tried to get follow-up with her quantitative hCG by OB/GYN today and could not get in so she came here to have that checked.  Past Medical History  Diagnosis Date  . PCOS (polycystic ovarian syndrome)    History reviewed. No pertinent past surgical history. History reviewed. No pertinent family history. Social History  Substance Use Topics  . Smoking status: Never Smoker   . Smokeless tobacco: None  . Alcohol Use: No   OB History    Gravida Para Term Preterm AB TAB SAB Ectopic Multiple Living   Review of Systems  Constitutional: Negative for fever.  HENT: Negative for congestion.   Eyes: Negative for visual disturbance.  Respiratory: Negative for shortness of breath.   Cardiovascular: Negative for chest pain.  Gastrointestinal: Negative for nausea, vomiting and abdominal pain.  Genitourinary: Negative for dysuria, vaginal bleeding and vaginal discharge.  Musculoskeletal: Negative for back pain.  Hematological: Does not bruise/bleed easily.  Psychiatric/Behavioral: Negative for confusion.      Allergies  Review of patient's allergies indicates no known allergies.  Home Medications   Prior to Admission medications   Medication Sig Start Date End Date Taking? Authorizing Provider  Prenatal Vit-Fe  Fumarate-FA (PRENATAL MULTIVITAMIN) TABS tablet Take 1 tablet by mouth daily at 12 noon.   Yes Historical Provider, MD  albuterol (PROVENTIL HFA;VENTOLIN HFA) 108 (90 Base) MCG/ACT inhaler Inhale 2 puffs into the lungs every 6 (six) hours as needed. Patient not taking: Reported on 12/27/2015 11/25/15   Rebecka Apley, MD  benzonatate (TESSALON PERLES) 100 MG capsule Take 1 capsule (100 mg total) by mouth every 6 (six) hours as needed for cough. Patient not taking: Reported on 12/27/2015 11/25/15   Rebecka Apley, MD  butalbital-acetaminophen-caffeine (FIORICET) [phone] MG tablet Take 1-2 tablets by mouth every 6 (six) hours as needed for headache. Patient not taking: Reported on 12/27/2015 11/25/15 11/24/16  Rebecka Apley, MD   BP 122/65 mmHg  Pulse 75  Temp(Src) 98.1 F (36.7 C) (Oral)  Resp 16  Ht  (1.499 m)  Wt 72.576 kg  BMI 32.30 kg/m2  SpO2 100%  LMP 10/31/2015 Physical Exam  Constitutional: She is oriented to person, place, and time. She appears well-developed and well-nourished. No distress.  HENT:  Head: Normocephalic and atraumatic.  Mouth/Throat: Oropharynx is clear and moist.  Eyes: Conjunctivae and EOM are normal. Pupils are equal, round, and reactive to light.  Neck: Normal range of motion. Neck supple.  Cardiovascular: Normal rate, regular rhythm and normal heart sounds.   Pulmonary/Chest: Effort normal and breath sounds normal. No respiratory distress.  Abdominal: Soft. Bowel sounds are normal. There is no tenderness.  Musculoskeletal: Normal range of motion.  Neurological: She is alert and oriented to person, place, and time. No cranial nerve deficit. She exhibits normal  muscle tone. Coordination normal.  Skin: Skin is warm.  Nursing note and vitals reviewed.   ED Course  Procedures (including critical care time) Labs Review Labs Reviewed  HCG, QUANTITATIVE, PREGNANCY - Abnormal; Notable for the following:    hCG, Beta Chain, Quant, S 562 (*)     All other components within normal limits   Results for orders placed or performed during the hospital encounter of 12/31/15  hCG, quantitative, pregnancy  Result Value Ref Range   hCG, Beta Chain, Quant, S 562 (H) <5 mIU/mL     Imaging Review No results found. I have personally reviewed and evaluated these images and lab results as part of my medical decision-making.   EKG Interpretation None      MDM   Final diagnoses:  Pregnancy    The patient seen by me a few days ago. Patient returns here for follow-up quantitative hCG was unable to get into OB/GYN office locally. Patient without any vaginal bleeding no significant abdominal pain.  Repeat quantitative hCG shows that it's now 562 was 100 previously all suggestive of developing pregnancy early on. Patient here without any concerns clinically now for threatened miscarriage.    Vanetta Mulders, MD 12/31/15 1726

## 2015-12-31 NOTE — Discharge Instructions (Signed)
Principal hormone number is increasing. Suggestive of early developing pregnancy. As we discussed before return for any significant vaginal bleeding or development of severe abdominal pain. Make appointment to follow-up with the OB/GYN. Continue prenatal vitamins.

## 2015-12-31 NOTE — ED Notes (Signed)
Pt was told to come here for HCG rechecked, here on Sunday and had HCG checked then and was told it was low.  Denies pain or vaginal discharge or vaginal bleeding

## 2016-01-06 ENCOUNTER — Encounter (HOSPITAL_COMMUNITY): Payer: Self-pay | Admitting: Emergency Medicine

## 2016-01-06 ENCOUNTER — Emergency Department (HOSPITAL_COMMUNITY)
Admission: EM | Admit: 2016-01-06 | Discharge: 2016-01-06 | Disposition: A | Discharge status: Home / Self Care | Payer: Medicaid Other | Attending: Emergency Medicine | Admitting: Emergency Medicine

## 2016-01-06 DIAGNOSIS — Z3490 Encounter for supervision of normal pregnancy, unspecified, unspecified trimester: Secondary | ICD-10-CM

## 2016-01-06 DIAGNOSIS — R1030 Lower abdominal pain, unspecified: Secondary | ICD-10-CM | POA: Insufficient documentation

## 2016-01-06 DIAGNOSIS — Z3A01 Less than 8 weeks gestation of pregnancy: Secondary | ICD-10-CM | POA: Insufficient documentation

## 2016-01-06 DIAGNOSIS — R630 Anorexia: Secondary | ICD-10-CM | POA: Insufficient documentation

## 2016-01-06 DIAGNOSIS — O210 Mild hyperemesis gravidarum: Secondary | ICD-10-CM | POA: Diagnosis present

## 2016-01-06 DIAGNOSIS — R112 Nausea with vomiting, unspecified: Secondary | ICD-10-CM

## 2016-01-06 DIAGNOSIS — R102 Pelvic and perineal pain: Secondary | ICD-10-CM | POA: Diagnosis not present

## 2016-01-06 DIAGNOSIS — R6883 Chills (without fever): Secondary | ICD-10-CM | POA: Diagnosis not present

## 2016-01-06 DIAGNOSIS — Z79899 Other long term (current) drug therapy: Secondary | ICD-10-CM | POA: Insufficient documentation

## 2016-01-06 DIAGNOSIS — R197 Diarrhea, unspecified: Secondary | ICD-10-CM | POA: Insufficient documentation

## 2016-01-06 DIAGNOSIS — R39198 Other difficulties with micturition: Secondary | ICD-10-CM | POA: Diagnosis not present

## 2016-01-06 DIAGNOSIS — O9989 Other specified diseases and conditions complicating pregnancy, childbirth and the puerperium: Secondary | ICD-10-CM | POA: Diagnosis not present

## 2016-01-06 DIAGNOSIS — Z8639 Personal history of other endocrine, nutritional and metabolic disease: Secondary | ICD-10-CM | POA: Insufficient documentation

## 2016-01-06 DIAGNOSIS — R34 Anuria and oliguria: Secondary | ICD-10-CM | POA: Diagnosis not present

## 2016-01-06 LAB — BASIC METABOLIC PANEL
ANION GAP: 10 (ref 5–15)
BUN: 13 mg/dL (ref 6–20)
CALCIUM: 8.9 mg/dL (ref 8.9–10.3)
CO2: 23 mmol/L (ref 22–32)
Chloride: 105 mmol/L (ref 101–111)
Creatinine, Ser: 0.77 mg/dL (ref 0.44–1.00)
GFR calc Af Amer: >60 mL/min (ref >60)
GLUCOSE: 91 mg/dL (ref 65–99)
Potassium: 3.6 mmol/L (ref 3.5–5.1)
Sodium: 138 mmol/L (ref 135–145)

## 2016-01-06 LAB — CBC WITH DIFFERENTIAL/PLATELET
BASOS ABS: 0 10*3/uL (ref 0.0–0.1)
Basophils Relative: 0 %
EOS PCT: 0 %
Eosinophils Absolute: 0 10*3/uL (ref 0.0–0.7)
HEMATOCRIT: 38.8 % (ref 36.0–46.0)
HEMOGLOBIN: 13.5 g/dL (ref 12.0–15.0)
LYMPHS PCT: 10 %
Lymphs Abs: 1.2 10*3/uL (ref 0.7–4.0)
MCH: 29.2 pg (ref 26.0–34.0)
MCHC: 34.8 g/dL (ref 30.0–36.0)
MCV: 83.8 fL (ref 78.0–100.0)
MONO ABS: 0.8 10*3/uL (ref 0.1–1.0)
MONOS PCT: 7 %
NEUTROS ABS: 9.8 10*3/uL — AB (ref 1.7–7.7)
Neutrophils Relative %: 83 %
Platelets: 333 10*3/uL (ref 150–400)
RBC: 4.63 MIL/uL (ref 3.87–5.11)
RDW: 12.3 % (ref 11.5–15.5)
WBC: 11.9 10*3/uL — ABNORMAL HIGH (ref 4.0–10.5)

## 2016-01-06 LAB — URINALYSIS, ROUTINE W REFLEX MICROSCOPIC
GLUCOSE, UA: NEGATIVE mg/dL
Hgb urine dipstick: NEGATIVE
KETONES UR: 40 mg/dL — AB
Leukocytes, UA: NEGATIVE
Nitrite: NEGATIVE
PH: 6 (ref 5.0–8.0)
Protein, ur: NEGATIVE mg/dL
Specific Gravity, Urine: >1.03 — ABNORMAL HIGH (ref 1.005–1.030)

## 2016-01-06 LAB — HCG, QUANTITATIVE, PREGNANCY: hCG, Beta Chain, Quant, S: 4330 m[IU]/mL — ABNORMAL HIGH (ref <5)

## 2016-01-06 MED ORDER — PROMETHAZINE HCL 25 MG/ML IJ SOLN
12.5000 mg | Freq: Once | INTRAMUSCULAR | Status: AC
Start: 2016-01-06 — End: 2016-01-06
  Administered 2016-01-06: 12.5 mg via INTRAVENOUS
  Filled 2016-01-06: qty 1

## 2016-01-06 MED ORDER — SODIUM CHLORIDE 0.9 % IV BOLUS (SEPSIS)
1000.0000 mL | Freq: Once | INTRAVENOUS | Status: AC
Start: 2016-01-06 — End: 2016-01-06
  Administered 2016-01-06: 1000 mL via INTRAVENOUS

## 2016-01-06 MED ORDER — PROMETHAZINE HCL 25 MG PO TABS: 25.0000 mg | ORAL_TABLET | Freq: Four times a day (QID) | ORAL | Status: DC | PRN

## 2016-01-06 NOTE — ED Notes (Signed)
Patient given discharge instruction, verbalized understand. IV removed, band aid applied. Patient ambulatory out of the department.  

## 2016-01-06 NOTE — ED Notes (Addendum)
Patient complaining of vomiting starting at 0200 this morning. Patient states she is approximately [redacted] weeks pregnant. Patient states she is unable to keep any fluids down.

## 2016-01-06 NOTE — ED Provider Notes (Signed)
CSN: [id number]     Arrival date & time 01/06/16  1519 History   First MD Initiated Contact with Patient 01/06/16 1640     Chief Complaint  Patient presents with  . Emesis      Patient is a 20 y.o. female presenting with vomiting. The history is provided by the patient.  Emesis Associated symptoms: abdominal pain and chills    patient presents with nausea vomiting diarrhea. Began around 2 in the morning last night. She has lower abdominal cramping also. States she has had decreased oral intake. States she's  Had decreased urination. She is also by weeks pregnant. She was seen in the ER around 1 week ago twice. She had initial beta of 100 and an ultrasound that did not show anything intrauterine. She then had a repeat beta a few days later of 530. She states the GI bug is gone through her house.  Past Medical History  Diagnosis Date  . PCOS (polycystic ovarian syndrome)    History reviewed. No pertinent past surgical history. History reviewed. No pertinent family history. Social History  Substance Use Topics  . Smoking status: Never Smoker   . Smokeless tobacco: None  . Alcohol Use: No   OB History    Gravida Para Term Preterm AB TAB SAB Ectopic Multiple Living   Review of Systems  Constitutional: Positive for chills and appetite change.  Gastrointestinal: Positive for nausea, vomiting and abdominal pain.  Genitourinary: Positive for decreased urine volume and difficulty urinating. Negative for vaginal bleeding and menstrual problem.        Pelvic cramping      Allergies  Review of patient's allergies indicates no known allergies.  Home Medications   Prior to Admission medications   Medication Sig Start Date End Date Taking? Authorizing Provider  Prenatal Vit-Fe Fumarate-FA (PRENATAL MULTIVITAMIN) TABS tablet Take 1 tablet by mouth daily.    Yes Historical Provider, MD  albuterol (PROVENTIL HFA;VENTOLIN HFA) 108 (90 Base) MCG/ACT inhaler Inhale 2 puffs  into the lungs every 6 (six) hours as needed. Patient not taking: Reported on 12/27/2015 11/25/15   Rebecka Apley, MD  benzonatate (TESSALON PERLES) 100 MG capsule Take 1 capsule (100 mg total) by mouth every 6 (six) hours as needed for cough. Patient not taking: Reported on 12/27/2015 11/25/15   Rebecka Apley, MD  butalbital-acetaminophen-caffeine (FIORICET) [phone] MG tablet Take 1-2 tablets by mouth every 6 (six) hours as needed for headache. Patient not taking: Reported on 12/27/2015 11/25/15 11/24/16  Rebecka Apley, MD  promethazine (PHENERGAN) 25 MG tablet Take 1 tablet (25 mg total) by mouth every 6 (six) hours as needed for nausea. 01/06/16   Benjiman Core, MD   BP 121/66 mmHg  Pulse 90  Temp(Src) 99 F (37.2 C) (Oral)  Resp 18  Ht  (1.499 m)  Wt 160 lb (72.576 kg)  BMI 32.30 kg/m2  SpO2 99%  LMP 10/31/2015 Physical Exam  Constitutional:   Patient appears uncomfortable  Cardiovascular: Normal rate.   Pulmonary/Chest: Effort normal.  Abdominal: There is tenderness.   Suprapubic and inferior abdominal tenderness.  Musculoskeletal: Normal range of motion.  Neurological: She is alert.    ED Course  Procedures (including critical care time) Labs Review Labs Reviewed  URINALYSIS, ROUTINE W REFLEX MICROSCOPIC (NOT AT [facility]) - Abnormal; Notable for the following:    Specific Gravity, Urine >1.030 (*)    Bilirubin Urine SMALL (*)  Ketones, ur 40 (*)    All other components within normal limits  CBC WITH DIFFERENTIAL/PLATELET - Abnormal; Notable for the following:    WBC 11.9 (*)    Neutro Abs 9.8 (*)    All other components within normal limits  HCG, QUANTITATIVE, PREGNANCY - Abnormal; Notable for the following:    hCG, Beta Chain, Quant, S 4330 (*)    All other components within normal limits  BASIC METABOLIC PANEL    Imaging Review No results found. I have personally reviewed and evaluated these images and lab results as part of my medical  decision-making.   EKG Interpretation None      MDM   Final diagnoses:  Nausea vomiting and diarrhea  Pregnant     patient with nausea vomiting diarrhea. Likely gastroenteritis. She is pregnant and her beta appears to be going up appropriately. hAs tolerated orals here discharge home.    Benjiman Core, MD 01/06/16 2202

## 2016-01-06 NOTE — Discharge Instructions (Signed)

## 2016-01-18 ENCOUNTER — Emergency Department (HOSPITAL_COMMUNITY)
Admission: EM | Admit: 2016-01-18 | Discharge: 2016-01-19 | Disposition: A | Discharge status: Home / Self Care | Payer: Medicaid Other | Attending: Emergency Medicine | Admitting: Emergency Medicine

## 2016-01-18 ENCOUNTER — Encounter (HOSPITAL_COMMUNITY): Payer: Self-pay | Admitting: Emergency Medicine

## 2016-01-18 DIAGNOSIS — W010XXA Fall on same level from slipping, tripping and stumbling without subsequent striking against object, initial encounter: Secondary | ICD-10-CM | POA: Insufficient documentation

## 2016-01-18 DIAGNOSIS — Z8639 Personal history of other endocrine, nutritional and metabolic disease: Secondary | ICD-10-CM | POA: Diagnosis not present

## 2016-01-18 DIAGNOSIS — Y9389 Activity, other specified: Secondary | ICD-10-CM | POA: Diagnosis not present

## 2016-01-18 DIAGNOSIS — Y998 Other external cause status: Secondary | ICD-10-CM | POA: Diagnosis not present

## 2016-01-18 DIAGNOSIS — Z3A01 Less than 8 weeks gestation of pregnancy: Secondary | ICD-10-CM | POA: Diagnosis not present

## 2016-01-18 DIAGNOSIS — O9A211 Injury, poisoning and certain other consequences of external causes complicating pregnancy, first trimester: Secondary | ICD-10-CM | POA: Diagnosis not present

## 2016-01-18 DIAGNOSIS — S3991XA Unspecified injury of abdomen, initial encounter: Secondary | ICD-10-CM | POA: Insufficient documentation

## 2016-01-18 DIAGNOSIS — S39012A Strain of muscle, fascia and tendon of lower back, initial encounter: Secondary | ICD-10-CM | POA: Diagnosis not present

## 2016-01-18 DIAGNOSIS — Y9289 Other specified places as the place of occurrence of the external cause: Secondary | ICD-10-CM | POA: Insufficient documentation

## 2016-01-18 NOTE — ED Notes (Signed)
Pt tripped and fell, she c/o lower back pain and states she is [redacted] weeks pregnant.

## 2016-01-18 NOTE — Discharge Instructions (Signed)

## 2016-01-19 ENCOUNTER — Ambulatory Visit (INDEPENDENT_AMBULATORY_CARE_PROVIDER_SITE_OTHER): Payer: Medicaid Other

## 2016-01-19 ENCOUNTER — Other Ambulatory Visit: Payer: Self-pay | Admitting: Obstetrics & Gynecology

## 2016-01-19 DIAGNOSIS — O3680X0 Pregnancy with inconclusive fetal viability, not applicable or unspecified: Secondary | ICD-10-CM

## 2016-01-19 DIAGNOSIS — Z3A08 8 weeks gestation of pregnancy: Secondary | ICD-10-CM | POA: Diagnosis not present

## 2016-01-19 NOTE — Progress Notes (Signed)
[place] 7+3wks,single IUP w/ys, pos fht 143 bpm,normal rt ov,unable to see lt ov,crl 12.2 mm

## 2016-01-22 NOTE — ED Provider Notes (Signed)
CSN: [id number]     Arrival date & time 01/18/16  2056 History   First MD Initiated Contact with Patient 01/18/16 2210     Chief Complaint  Patient presents with  . Back Pain     (Consider location/radiation/quality/duration/timing/severity/associated sxs/prior Treatment) HPI  Karlea Mckibbin is a 20 y.o. female who presents to the Emergency Department complaining of low back pain after a fall down several carpeted in-door steps.  Pt is [redacted] weeks pregnant. She reports pain across her lower back.  She has c/o cramping to her lower abdomen.  She was seen here nearly two weeks ago for abdominal pain.  She denies injury to her abdomen, numbness or weakness of the lower extremities, urine or bowel changes, pelvic pain and vaginal bleeding. Also denies head injury or LOC     Past Medical History  Diagnosis Date  . PCOS (polycystic ovarian syndrome)    History reviewed. No pertinent past surgical history. History reviewed. No pertinent family history. Social History  Substance Use Topics  . Smoking status: Never Smoker   . Smokeless tobacco: None  . Alcohol Use: No   OB History    Gravida Para Term Preterm AB TAB SAB Ectopic Multiple Living   Review of Systems  Constitutional: Negative for fever.  Respiratory: Negative for shortness of breath.   Cardiovascular: Negative for chest pain.  Gastrointestinal: Negative for vomiting, abdominal pain (abdominal cramping) and constipation.  Genitourinary: Negative for dysuria, hematuria, flank pain, decreased urine volume, vaginal bleeding, difficulty urinating, vaginal pain and pelvic pain.  Musculoskeletal: Positive for back pain. Negative for joint swelling.  Skin: Negative for rash.  Neurological: Negative for weakness and numbness.  All other systems reviewed and are negative.     Allergies  Review of patient's allergies indicates no known allergies.  Home Medications   Prior to Admission medications    Medication Sig Start Date End Date Taking? Authorizing Provider  Prenatal Vit-Fe Fumarate-FA (PRENATAL MULTIVITAMIN) TABS tablet Take 1 tablet by mouth daily.    Yes Historical Provider, MD  promethazine (PHENERGAN) 25 MG tablet Take 1 tablet (25 mg total) by mouth every 6 (six) hours as needed for nausea. 01/06/16  Yes Benjiman Core, MD  albuterol (PROVENTIL HFA;VENTOLIN HFA) 108 (90 Base) MCG/ACT inhaler Inhale 2 puffs into the lungs every 6 (six) hours as needed. Patient not taking: Reported on 12/27/2015 11/25/15   Rebecka Apley, MD  benzonatate (TESSALON PERLES) 100 MG capsule Take 1 capsule (100 mg total) by mouth every 6 (six) hours as needed for cough. Patient not taking: Reported on 12/27/2015 11/25/15   Rebecka Apley, MD  butalbital-acetaminophen-caffeine (FIORICET) [phone] MG tablet Take 1-2 tablets by mouth every 6 (six) hours as needed for headache. Patient not taking: Reported on 12/27/2015 11/25/15 11/24/16  Rebecka Apley, MD   BP 105/56 mmHg  Pulse 96  Temp(Src) 98.1 F (36.7 C) (Oral)  Resp 18  Ht  (1.499 m)  Wt 72.576 kg  BMI 32.30 kg/m2  SpO2 100%  LMP 10/31/2015 Physical Exam  Constitutional: She is oriented to person, place, and time. She appears well-developed and well-nourished. No distress.  HENT:  Head: Normocephalic and atraumatic.  Neck: Normal range of motion. Neck supple.  Cardiovascular: Normal rate, regular rhythm, normal heart sounds and intact distal pulses.   No murmur heard. Pulmonary/Chest: Effort normal and breath sounds normal. No respiratory distress.  Abdominal: Soft. She exhibits  no distension. There is tenderness. There is no guarding.  Mild diffuse tenderness to lower abdomen.  No guarding .    Musculoskeletal: She exhibits tenderness. She exhibits no edema.       Lumbar back: She exhibits tenderness and pain. She exhibits normal range of motion, no swelling, no deformity, no laceration and normal pulse.  ttp of the  bilateral lumbar paraspinal muscles.  No spinal tenderness.  DP pulses are brisk and symmetrical.  Distal sensation intact.  Pt has 5/5 strength against resistance of bilateral lower extremities.     Neurological: She is alert and oriented to person, place, and time. She has normal strength. No sensory deficit. She exhibits normal muscle tone. Coordination and gait normal.  Reflex Scores:      Patellar reflexes are 2+ on the right side and 2+ on the left side.      Achilles reflexes are 2+ on the right side and 2+ on the left side. Skin: Skin is warm and dry. No rash noted.  Nursing note and vitals reviewed.   ED Course  Procedures (including critical care time) Labs Review Labs Reviewed - No data to display  Imaging Review No results found. I have personally reviewed and evaluated these images and lab results as part of my medical decision-making.   EKG Interpretation None      MDM   Final diagnoses:  Lumbar strain, initial encounter   Pt well appearing.  No focal neuro deficits. Ambulates with a steady gait.  No concerning sx's for emergent neurological process.  Likely lumbar strain.  No bony tenderness.  Pt is [redacted] weeks pregnant, denies vaginal bleeding, she has crampy lower pain. i have discussed pt hx with Dr. Fayrene Fearing.  I have also discussed possibility of threatened AB with the patient and she verbalized understanding.  No indication for emergent [place].  She agrees to close f/u with OB and advised to return for any worsening sx's.    Pt agrees to symptomatic tx and close PMD f/u.  She appears stable for d/c and agrees to plan    Pauline Aus, PA-C 01/22/16 0039  Rolland Porter, MD 01/31/16 [phone]

## 2016-01-26 ENCOUNTER — Telehealth: Payer: Self-pay | Admitting: Obstetrics & Gynecology

## 2016-01-26 NOTE — Telephone Encounter (Signed)
Pt called stating that she is 8 weeks preg. And is having a lot of swelling. Pt states she did not have this the last time she was preg. And is very concerned. Please contact

## 2016-01-26 NOTE — Telephone Encounter (Signed)
Pt c/o lose stools x 1 week, lose stool x 1-2 times daily. Pt informed to push fluids and keep her appt for tomorrow morning with Joellyn Haff, CNM. Pt verbalized understanding.

## 2016-01-27 ENCOUNTER — Ambulatory Visit (INDEPENDENT_AMBULATORY_CARE_PROVIDER_SITE_OTHER): Payer: Medicaid Other | Admitting: Women's Health

## 2016-01-27 ENCOUNTER — Other Ambulatory Visit (HOSPITAL_COMMUNITY)
Admission: RE | Admit: 2016-01-27 | Discharge: 2016-01-27 | Disposition: A | Discharge status: Home / Self Care | Payer: Medicaid Other | Source: Ambulatory Visit | Attending: Obstetrics & Gynecology | Admitting: Obstetrics & Gynecology

## 2016-01-27 ENCOUNTER — Encounter: Payer: Self-pay | Admitting: Women's Health

## 2016-01-27 VITALS — BP 124/68 | HR 72 | Wt 169.0 lb

## 2016-01-27 DIAGNOSIS — Z331 Pregnant state, incidental: Secondary | ICD-10-CM

## 2016-01-27 DIAGNOSIS — E039 Hypothyroidism, unspecified: Secondary | ICD-10-CM

## 2016-01-27 DIAGNOSIS — Z3A09 9 weeks gestation of pregnancy: Secondary | ICD-10-CM | POA: Diagnosis not present

## 2016-01-27 DIAGNOSIS — O09891 Supervision of other high risk pregnancies, first trimester: Secondary | ICD-10-CM

## 2016-01-27 DIAGNOSIS — Z8269 Family history of other diseases of the musculoskeletal system and connective tissue: Secondary | ICD-10-CM | POA: Insufficient documentation

## 2016-01-27 DIAGNOSIS — O09211 Supervision of pregnancy with history of pre-term labor, first trimester: Secondary | ICD-10-CM

## 2016-01-27 DIAGNOSIS — Z124 Encounter for screening for malignant neoplasm of cervix: Secondary | ICD-10-CM

## 2016-01-27 DIAGNOSIS — Z113 Encounter for screening for infections with a predominantly sexual mode of transmission: Secondary | ICD-10-CM | POA: Insufficient documentation

## 2016-01-27 DIAGNOSIS — Z1389 Encounter for screening for other disorder: Secondary | ICD-10-CM

## 2016-01-27 DIAGNOSIS — Z3481 Encounter for supervision of other normal pregnancy, first trimester: Secondary | ICD-10-CM

## 2016-01-27 DIAGNOSIS — Z0283 Encounter for blood-alcohol and blood-drug test: Secondary | ICD-10-CM

## 2016-01-27 DIAGNOSIS — F418 Other specified anxiety disorders: Secondary | ICD-10-CM | POA: Insufficient documentation

## 2016-01-27 DIAGNOSIS — O09291 Supervision of pregnancy with other poor reproductive or obstetric history, first trimester: Secondary | ICD-10-CM

## 2016-01-27 DIAGNOSIS — Z840 Family history of diseases of the skin and subcutaneous tissue: Secondary | ICD-10-CM

## 2016-01-27 DIAGNOSIS — Z3491 Encounter for supervision of normal pregnancy, unspecified, first trimester: Secondary | ICD-10-CM

## 2016-01-27 DIAGNOSIS — Z01419 Encounter for gynecological examination (general) (routine) without abnormal findings: Secondary | ICD-10-CM | POA: Diagnosis not present

## 2016-01-27 DIAGNOSIS — Z3490 Encounter for supervision of normal pregnancy, unspecified, unspecified trimester: Secondary | ICD-10-CM | POA: Insufficient documentation

## 2016-01-27 DIAGNOSIS — E282 Polycystic ovarian syndrome: Secondary | ICD-10-CM | POA: Insufficient documentation

## 2016-01-27 DIAGNOSIS — Z369 Encounter for antenatal screening, unspecified: Secondary | ICD-10-CM

## 2016-01-27 DIAGNOSIS — Z3682 Encounter for antenatal screening for nuchal translucency: Secondary | ICD-10-CM

## 2016-01-27 DIAGNOSIS — E038 Other specified hypothyroidism: Secondary | ICD-10-CM

## 2016-01-27 LAB — POCT URINALYSIS DIPSTICK
Blood, UA: NEGATIVE
Glucose, UA: NEGATIVE
KETONES UA: NEGATIVE
Leukocytes, UA: NEGATIVE
Nitrite, UA: NEGATIVE
PROTEIN UA: NEGATIVE

## 2016-01-27 NOTE — Progress Notes (Addendum)
Subjective:  Tara Mcmahon is a 20 y.o. [phone] Caucasian female at [redacted]w[redacted]d by 7wk u/s, being seen today for her first obstetrical visit.  Her obstetrical history is significant for 26wk PTB: received rescue cerclage at 21.5wks d/t cervical incompetence/bbow, 2d later pprom, began bleeding and laboring @ 26wks and delivered baby w/in , suspected abruption, path of placenta also revealed [place] (in [place]), sab x 1.  Child is 3 now and has no deficits. Pregnancy history fully reviewed. Reports h/o PCOS, depression/anxiety- no meds in few years- was on zoloft  daily and ativan- requests referral to counseling for anxiety d/t feelings towards last birth experience. Denies depression, SI/HI at present time.  States they were going to try to conceive last year, so talked to perinatologist d/t hx and they recommended serial monitoring for SLE during pregnancy (mom and 2 maternal aunts have SLE), serial CL checks, 17P, referral to endocrinologist d/t h/o PCOS, did not recommend prophylactic cerclage.   Patient reports occ loose stools, maybe one/day or every other day. Some nausea, no vomiting. Denies vb, cramping, uti s/s, abnormal/malodorous vag d/c, or vulvovaginal itching/irritation.  BP 124/68 mmHg  Pulse 72  Wt 169 lb (76.658 kg)  LMP 10/31/2015  HISTORY: OB History  Gravida Para Term Preterm AB SAB TAB Ectopic Multiple Living  # Outcome Date GA Lbr Len/2nd Weight Sex Delivery Anes PTL Lv  3 Current           2 Preterm 09/19/12 [redacted]w[redacted]d  1 lb 13 oz (0.822 kg) F Vag-Spont  Y Y  1 SAB              Past Medical History  Diagnosis Date  . PCOS (polycystic ovarian syndrome)   . Depression   . Anxiety    Past Surgical History  Procedure Laterality Date  . Frenulectomy, lower labial    . Cervical cerclage     Family History  Problem Relation Age of Onset  . Arthritis Mother   . Lupus Mother   . Autoimmune disease Mother   . Cancer Maternal Grandmother      breast  . Heart disease Maternal Grandfather   . Heart disease Paternal Grandfather   . Hypertension Paternal Grandfather     Exam   System:     General: Well developed & nourished, no acute distress   Skin: Warm & dry, normal coloration and turgor, no rashes   Neurologic: Alert & oriented, normal mood   Cardiovascular: Regular rate & rhythm   Respiratory: Effort & rate normal, LCTAB, acyanotic   Abdomen: Soft, non tender   Extremities: normal strength, tone   Pelvic Exam:    Perineum: Normal perineum   Vulva: Normal, no lesions   Vagina:  Normal mucosa, normal discharge   Cervix: Normal, bulbous, appears closed   Uterus: Normal size/shape/contour for GA   Thin prep pap smear obtained w/ reflex high risk HPV cotesting FHR: 150s via informal u/s   Assessment:   Pregnancy: [id number] Patient Active Problem List   Diagnosis Date Noted  . Supervision of normal pregnancy 01/27/2016    Priority: High  . History of preterm delivery, currently pregnant 01/27/2016    [redacted]w[redacted]d [id number] New OB visit H/O 26wk PTB d/t cervical incompetence/pprom/labor/abruption/infection Loose stools, not diarrhea PCOS  Plan:  Initial labs drawn Continue prenatal vitamins Problem list reviewed and updated Reviewed n/v relief measures and warning s/s to report Reviewed recommended weight gain based  on pre-gravid BMI Encouraged well-balanced diet Genetic Screening discussed Integrated Screen: requested Cystic fibrosis screening discussed declined Ultrasound discussed; fetal survey: requested Follow up in 4 weeks for 1st IT/NT and visit [facility] completed Offered flu shot, not comfortable taking during 1st trimester- wants at next visit Offered Makena, accepted, Mcaid still pending- will order at next visit (had her sign order form today) Will discuss serial SLE testing and referral to endocrinologist w/ our MDs  Can take GI probiotic for loose stools  Marge Duncans CNM,  [facility] 01/27/2016 9:22 AM     Discussed all w/ [facility], recommends prophylactic cerclage @ 14wks, adding lupus anticoagulant, ANA, and A1C to labs. Does not recommend endocrinologist referral. Called pt and notified- states she also has h/o 'fluctuation in thyroid levels' and was told she had subclinical hypothyroidism, so will also add TSH. Per lab, will need to come back in to have drawn as need different tube for lupus anticoagulant. Pt plans to come today.  Cheral Marker, CNM, [facility] 01/27/2016 3:14 PM

## 2016-01-27 NOTE — Patient Instructions (Signed)

## 2016-01-28 ENCOUNTER — Other Ambulatory Visit: Payer: Self-pay

## 2016-01-28 ENCOUNTER — Telehealth: Payer: Self-pay | Admitting: *Deleted

## 2016-01-28 LAB — URINALYSIS, ROUTINE W REFLEX MICROSCOPIC
BILIRUBIN UA: NEGATIVE
GLUCOSE, UA: NEGATIVE
KETONES UA: NEGATIVE
Leukocytes, UA: NEGATIVE
NITRITE UA: NEGATIVE
Protein, UA: NEGATIVE
RBC, UA: NEGATIVE
SPEC GRAV UA: 1.02 (ref 1.005–1.030)
UUROB: 0.2 mg/dL (ref 0.2–1.0)
pH, UA: 6 (ref 5.0–7.5)

## 2016-01-28 LAB — ABO/RH: Rh Factor: POSITIVE

## 2016-01-28 LAB — CBC
Hematocrit: 37.8 % (ref 34.0–46.6)
Hemoglobin: 12.8 g/dL (ref 11.1–15.9)
MCH: 29.2 pg (ref 26.6–33.0)
MCHC: 33.9 g/dL (ref 31.5–35.7)
MCV: 86 fL (ref 79–97)
PLATELETS: 375 10*3/uL (ref 150–379)
RBC: 4.39 x10E6/uL (ref 3.77–5.28)
RDW: 12.7 % (ref 12.3–15.4)
WBC: 12.1 10*3/uL — ABNORMAL HIGH (ref 3.4–10.8)

## 2016-01-28 LAB — URINE CULTURE: ORGANISM ID, BACTERIA: NO GROWTH

## 2016-01-28 LAB — PMP SCREEN PROFILE (10S), URINE
Amphetamine Screen, Ur: NEGATIVE ng/mL
BARBITURATE SCRN UR: NEGATIVE ng/mL
Benzodiazepine Screen, Urine: NEGATIVE ng/mL
CREATININE(CRT), U: 163.7 mg/dL (ref 20.0–300.0)
Cannabinoids Ur Ql Scn: NEGATIVE ng/mL
Cocaine(Metab.)Screen, Urine: NEGATIVE ng/mL
METHADONE SCREEN, URINE: NEGATIVE ng/mL
Opiate Scrn, Ur: NEGATIVE ng/mL
Oxycodone+Oxymorphone Ur Ql Scn: NEGATIVE ng/mL
PCP Scrn, Ur: NEGATIVE ng/mL
PROPOXYPHENE SCREEN: NEGATIVE ng/mL
Ph of Urine: 5.5 (ref 4.5–8.9)

## 2016-01-28 LAB — VARICELLA ZOSTER ANTIBODY, IGG: VARICELLA: 654 {index} (ref >165)

## 2016-01-28 LAB — ANTIBODY SCREEN: Antibody Screen: NEGATIVE

## 2016-01-28 LAB — RPR: RPR Ser Ql: NONREACTIVE

## 2016-01-28 LAB — HEPATITIS B SURFACE ANTIGEN: HEP B S AG: NEGATIVE

## 2016-01-28 LAB — HIV ANTIBODY (ROUTINE TESTING W REFLEX): HIV Screen 4th Generation wRfx: NONREACTIVE

## 2016-01-28 LAB — RUBELLA SCREEN: Rubella Antibodies, IGG: 1.17 index (ref >0.99)

## 2016-01-28 NOTE — Telephone Encounter (Signed)
Pt states very concerned about getting a prophylactic cerclage for this pregnancy. Pt states she has multiple questions about the risk and benefits. Pt given an appt with Dr. Despina Hidden for tomorrow to discuss in detail.

## 2016-01-29 ENCOUNTER — Encounter: Payer: Self-pay | Admitting: Obstetrics & Gynecology

## 2016-01-29 ENCOUNTER — Other Ambulatory Visit: Payer: Self-pay

## 2016-01-29 LAB — CYTOLOGY - PAP

## 2016-01-29 LAB — HEMOGLOBIN A1C
ESTIMATED AVERAGE GLUCOSE: 105 mg/dL
Hgb A1c MFr Bld: 5.3 % (ref 4.8–5.6)

## 2016-01-29 LAB — LUPUS ANTICOAGULANT PANEL
Dilute Viper Venom Time: 35 s (ref 0.0–44.0)
PTT Lupus Anticoagulant: 35 s (ref 0.0–43.6)

## 2016-01-29 LAB — ANA: Anti Nuclear Antibody(ANA): NEGATIVE

## 2016-01-29 LAB — TSH: TSH: 3.97 u[IU]/mL (ref 0.450–4.500)

## 2016-02-03 ENCOUNTER — Other Ambulatory Visit: Payer: Self-pay | Admitting: Women's Health

## 2016-02-03 ENCOUNTER — Encounter: Payer: Self-pay | Admitting: Women's Health

## 2016-02-03 ENCOUNTER — Ambulatory Visit (INDEPENDENT_AMBULATORY_CARE_PROVIDER_SITE_OTHER): Payer: Medicaid Other | Admitting: Women's Health

## 2016-02-03 ENCOUNTER — Telehealth: Payer: Self-pay | Admitting: *Deleted

## 2016-02-03 VITALS — BP 122/66 | HR 80 | Wt 169.0 lb

## 2016-02-03 DIAGNOSIS — Z331 Pregnant state, incidental: Secondary | ICD-10-CM | POA: Diagnosis not present

## 2016-02-03 DIAGNOSIS — Z3A10 10 weeks gestation of pregnancy: Secondary | ICD-10-CM | POA: Diagnosis not present

## 2016-02-03 DIAGNOSIS — O99341 Other mental disorders complicating pregnancy, first trimester: Secondary | ICD-10-CM

## 2016-02-03 DIAGNOSIS — Z3481 Encounter for supervision of other normal pregnancy, first trimester: Secondary | ICD-10-CM

## 2016-02-03 DIAGNOSIS — O99281 Endocrine, nutritional and metabolic diseases complicating pregnancy, first trimester: Secondary | ICD-10-CM | POA: Diagnosis not present

## 2016-02-03 DIAGNOSIS — E079 Disorder of thyroid, unspecified: Secondary | ICD-10-CM

## 2016-02-03 DIAGNOSIS — Z1389 Encounter for screening for other disorder: Secondary | ICD-10-CM | POA: Diagnosis not present

## 2016-02-03 DIAGNOSIS — F418 Other specified anxiety disorders: Secondary | ICD-10-CM

## 2016-02-03 DIAGNOSIS — Z3491 Encounter for supervision of normal pregnancy, unspecified, first trimester: Secondary | ICD-10-CM

## 2016-02-03 DIAGNOSIS — Z8659 Personal history of other mental and behavioral disorders: Secondary | ICD-10-CM | POA: Insufficient documentation

## 2016-02-03 LAB — POCT URINALYSIS DIPSTICK
Blood, UA: NEGATIVE
GLUCOSE UA: NEGATIVE
KETONES UA: NEGATIVE
Leukocytes, UA: NEGATIVE
Nitrite, UA: NEGATIVE
Protein, UA: NEGATIVE

## 2016-02-03 MED ORDER — LEVOTHYROXINE SODIUM 50 MCG PO TABS: 50.0000 ug | ORAL_TABLET | Freq: Every day | ORAL | Status: DC

## 2016-02-03 MED ORDER — SERTRALINE HCL 25 MG PO TABS: 25.0000 mg | ORAL_TABLET | Freq: Every day | ORAL | Status: DC

## 2016-02-03 NOTE — Progress Notes (Signed)
Work-in Low-risk OB appointment [id number] 5976w4d Estimated Date of Delivery: 09/03/16 BP 122/66 mmHg  Pulse 80  Wt 169 lb (76.658 kg)  LMP 10/31/2015  BP, weight, and urine reviewed.  Refer to obstetrical flow sheet for FH & FHR.  No fm yet. Denies cramping, lof, vb, or uti s/s. States she is concerned b/c her TSH is elevated (3.970) and she has read on internet that it can cause miscarriages, and she has lost all of pregnancy sx since last week. Pt crying/tearful and visually anxious. Does have anxiety/depression- we discussed referral to counselor at last visit, pregnancy Mcaid still pending. Discussed option of medication, states she was put on zoloft after her 26wk delivery d/t severe PPD and suicidal ideations. Denies SI/HI at present, but would like to start on a low dose of zoloft. Rx zoloft 25mg  daily.  Discussed that we will check free T4 today, if low- has hypothyroidism, if normal has subclinical hypothyroidism. Reviewed w/ JVF- will begin synthroid 50mcg daily today b/c we will treat even if subclinical and aim for TSH in 1-2 range. TSH printed off per pt request so she can give to her endocrinologist.  Informal u/s reveals active fetus w/ FHR 169- pt reassured  Reviewed warning s/s to report. Plan:  Continue routine obstetrical care  F/U as scheduled for 1st it/nt and OB appointment w/ [facility] to discuss prophylactic cerclage

## 2016-02-03 NOTE — Telephone Encounter (Signed)
Pt informed of WNL lab results (Lupus Panel, TSH, HgB A1C, Varicella) from 01/27/2016. Pt reviewed lab results on MyChart and these labs were the only labs she had questions about.

## 2016-02-04 ENCOUNTER — Encounter: Payer: Self-pay | Admitting: Women's Health

## 2016-02-04 DIAGNOSIS — E039 Hypothyroidism, unspecified: Secondary | ICD-10-CM | POA: Insufficient documentation

## 2016-02-04 DIAGNOSIS — E038 Other specified hypothyroidism: Secondary | ICD-10-CM | POA: Insufficient documentation

## 2016-02-04 LAB — T4, FREE: FREE T4: 1.12 ng/dL (ref 0.82–1.77)

## 2016-02-18 ENCOUNTER — Telehealth: Payer: Self-pay | Admitting: Advanced Practice Midwife

## 2016-02-18 NOTE — Telephone Encounter (Signed)
Spoke with pt. Pt has a sore throat and stuffy nose. No fever. I advised to gargle warm salt water and can take Benadryl, drowsy precautions. Advised to let us know if this didn't help or if she got worse. Pt voiced understanding. JSY

## 2016-02-24 ENCOUNTER — Ambulatory Visit (INDEPENDENT_AMBULATORY_CARE_PROVIDER_SITE_OTHER): Payer: Medicaid Other | Admitting: Obstetrics & Gynecology

## 2016-02-24 ENCOUNTER — Encounter: Payer: Self-pay | Admitting: Obstetrics & Gynecology

## 2016-02-24 ENCOUNTER — Ambulatory Visit (INDEPENDENT_AMBULATORY_CARE_PROVIDER_SITE_OTHER): Payer: Medicaid Other

## 2016-02-24 VITALS — BP 118/80 | HR 88 | Wt 166.4 lb

## 2016-02-24 DIAGNOSIS — Z3682 Encounter for antenatal screening for nuchal translucency: Secondary | ICD-10-CM

## 2016-02-24 DIAGNOSIS — Z3481 Encounter for supervision of other normal pregnancy, first trimester: Secondary | ICD-10-CM | POA: Diagnosis not present

## 2016-02-24 DIAGNOSIS — O09291 Supervision of pregnancy with other poor reproductive or obstetric history, first trimester: Secondary | ICD-10-CM | POA: Diagnosis not present

## 2016-02-24 DIAGNOSIS — O09292 Supervision of pregnancy with other poor reproductive or obstetric history, second trimester: Secondary | ICD-10-CM

## 2016-02-24 DIAGNOSIS — Z1389 Encounter for screening for other disorder: Secondary | ICD-10-CM | POA: Diagnosis not present

## 2016-02-24 DIAGNOSIS — O3431 Maternal care for cervical incompetence, first trimester: Secondary | ICD-10-CM

## 2016-02-24 DIAGNOSIS — Z36 Encounter for antenatal screening of mother: Secondary | ICD-10-CM | POA: Diagnosis not present

## 2016-02-24 DIAGNOSIS — Z331 Pregnant state, incidental: Secondary | ICD-10-CM | POA: Diagnosis not present

## 2016-02-24 DIAGNOSIS — Z3491 Encounter for supervision of normal pregnancy, unspecified, first trimester: Secondary | ICD-10-CM

## 2016-02-24 DIAGNOSIS — Z3A13 13 weeks gestation of pregnancy: Secondary | ICD-10-CM | POA: Diagnosis not present

## 2016-02-24 DIAGNOSIS — Z3492 Encounter for supervision of normal pregnancy, unspecified, second trimester: Secondary | ICD-10-CM

## 2016-02-24 LAB — POCT URINALYSIS DIPSTICK
Blood, UA: NEGATIVE
GLUCOSE UA: NEGATIVE
Ketones, UA: NEGATIVE
Leukocytes, UA: NEGATIVE
NITRITE UA: NEGATIVE
Protein, UA: NEGATIVE

## 2016-02-24 MED ORDER — PROGESTERONE MICRONIZED 200 MG PO CAPS: ORAL_CAPSULE | ORAL | Status: DC

## 2016-02-24 NOTE — Progress Notes (Signed)
[id number] 6768w4d Estimated Date of Delivery: 09/03/16  Blood pressure 118/80, pulse 88, weight 166 lb 6.4 oz (75.479 kg), last menstrual period 10/31/2015.   BP weight and urine results all reviewed and noted.  Please refer to the obstetrical flow sheet for the fundal height and fetal heart rate documentation:  Patient reports good fetal movement, denies any bleeding and no rupture of membranes symptoms or regular contractions. Patient is without complaints. All questions were answered.  Orders Placed This Encounter  Procedures  . Maternal Screen, Integrated #1    Plan:  Continued routine obstetrical care, 1st IT today, cervical length 4 weeks  Return in about 4 weeks (around 03/23/2016) for cervical length sonogram + , HROB, with Dr Despina HiddenEure.

## 2016-02-24 NOTE — Addendum Note (Signed)
Addended by: Federico FlakeNES, PEGGY A on: 02/24/2016 04:23 PM   Modules accepted: Orders

## 2016-02-24 NOTE — Progress Notes (Signed)
US 12+4 wks,cx 4 cm,post pl gr 0,crl 64.51mm,normal ov's bilat,fhr 156 bpm,NB present,NT 1.377mm,measurements c/w dates

## 2016-02-25 ENCOUNTER — Encounter: Payer: Self-pay | Admitting: Obstetrics & Gynecology

## 2016-02-25 ENCOUNTER — Telehealth: Payer: Self-pay | Admitting: Obstetrics & Gynecology

## 2016-02-25 NOTE — Telephone Encounter (Signed)
Pt called stating that Also, if I opt for the cerclage would a round of preventative antibiotics be possible for during recovery? Please contact pt

## 2016-02-26 LAB — MATERNAL SCREEN, INTEGRATED #1
CROWN RUMP LENGTH MAT SCREEN: 64.1 mm
Gest. Age on Collection Date: 12.7 weeks
Maternal Age at EDD: 22.2 years
Nuchal Translucency (NT): 1.7 mm
Number of Fetuses: 1
PAPP-A Value: 556.3 ng/mL
Weight: 166 [lb_av]

## 2016-03-03 ENCOUNTER — Encounter: Payer: Self-pay | Admitting: Obstetrics & Gynecology

## 2016-03-03 ENCOUNTER — Ambulatory Visit (INDEPENDENT_AMBULATORY_CARE_PROVIDER_SITE_OTHER): Payer: Medicaid Other | Admitting: Obstetrics & Gynecology

## 2016-03-03 VITALS — BP 120/80 | HR 76 | Wt 169.0 lb

## 2016-03-03 DIAGNOSIS — Z3685 Encounter for antenatal screening for Streptococcus B: Secondary | ICD-10-CM

## 2016-03-03 DIAGNOSIS — O09292 Supervision of pregnancy with other poor reproductive or obstetric history, second trimester: Secondary | ICD-10-CM

## 2016-03-03 DIAGNOSIS — O3432 Maternal care for cervical incompetence, second trimester: Secondary | ICD-10-CM | POA: Diagnosis not present

## 2016-03-03 DIAGNOSIS — O09212 Supervision of pregnancy with history of pre-term labor, second trimester: Secondary | ICD-10-CM | POA: Diagnosis not present

## 2016-03-03 DIAGNOSIS — Z3A14 14 weeks gestation of pregnancy: Secondary | ICD-10-CM

## 2016-03-03 DIAGNOSIS — Z01818 Encounter for other preprocedural examination: Secondary | ICD-10-CM

## 2016-03-03 DIAGNOSIS — Z3492 Encounter for supervision of normal pregnancy, unspecified, second trimester: Secondary | ICD-10-CM

## 2016-03-03 DIAGNOSIS — Z331 Pregnant state, incidental: Secondary | ICD-10-CM | POA: Diagnosis not present

## 2016-03-03 DIAGNOSIS — Z1389 Encounter for screening for other disorder: Secondary | ICD-10-CM | POA: Diagnosis not present

## 2016-03-03 LAB — POCT URINALYSIS DIPSTICK
Glucose, UA: NEGATIVE
Ketones, UA: NEGATIVE
Leukocytes, UA: NEGATIVE
NITRITE UA: NEGATIVE
PROTEIN UA: NEGATIVE
RBC UA: NEGATIVE

## 2016-03-03 NOTE — Addendum Note (Signed)
Addended by: Federico FlakeNES, Orva Gwaltney A on: 03/03/2016 02:35 PM   Modules accepted: Orders

## 2016-03-03 NOTE — Progress Notes (Signed)
Preoperative History and Physical  Tara Mcmahon is a 20 y.o. [phone] with Patient's last menstrual period was 10/31/2015. admitted for a placement of McDonald cerclage due to history of incompetent cervix.    PMH:    Past Medical History  Diagnosis Date  . PCOS (polycystic ovarian syndrome)   . Depression   . Anxiety     PSH:     Past Surgical History  Procedure Laterality Date  . Frenulectomy, lower labial    . Cervical cerclage      POb/GynH:      OB History    Gravida Para Term Preterm AB TAB SAB Ectopic Multiple Living   SH:   Social History  Substance Use Topics  . Smoking status: Never Smoker   . Smokeless tobacco: Never Used  . Alcohol Use: No    FH:    Family History  Problem Relation Age of Onset  . Arthritis Mother   . Lupus Mother   . Autoimmune disease Mother   . Cancer Maternal Grandmother     breast  . Heart disease Maternal Grandfather   . Heart disease Paternal Grandfather   . Hypertension Paternal Grandfather      Allergies: No Known Allergies  Medications:       Current outpatient prescriptions:  .  levothyroxine (SYNTHROID) 50 MCG tablet, Take 1 tablet (50 mcg total) by mouth daily before breakfast., Disp: 30 tablet, Rfl: 3 .  Prenatal Vit-Fe Fumarate-FA (PRENATAL MULTIVITAMIN) TABS tablet, Take 1 tablet by mouth daily. , Disp: , Rfl:  .  progesterone (PROMETRIUM) 200 MG capsule, Place 1 capsule nightly, Disp: 30 capsule, Rfl: 11  Review of Systems:   Review of Systems  Constitutional: Negative for fever, chills, weight loss, malaise/fatigue and diaphoresis.  HENT: Negative for hearing loss, ear pain, nosebleeds, congestion, sore throat, neck pain, tinnitus and ear discharge.   Eyes: Negative for blurred vision, double vision, photophobia, pain, discharge and redness.  Respiratory: Negative for cough, hemoptysis, sputum production, shortness of breath, wheezing and stridor.   Cardiovascular: Negative for chest  pain, palpitations, orthopnea, claudication, leg swelling and PND.  Gastrointestinal: Positive for abdominal pain. Negative for heartburn, nausea, vomiting, diarrhea, constipation, blood in stool and melena.  Genitourinary: Negative for dysuria, urgency, frequency, hematuria and flank pain.  Musculoskeletal: Negative for myalgias, back pain, joint pain and falls.  Skin: Negative for itching and rash.  Neurological: Negative for dizziness, tingling, tremors, sensory change, speech change, focal weakness, seizures, loss of consciousness, weakness and headaches.  Endo/Heme/Allergies: Negative for environmental allergies and polydipsia. Does not bruise/bleed easily.  Psychiatric/Behavioral: Negative for depression, suicidal ideas, hallucinations, memory loss and substance abuse. The patient is not nervous/anxious and does not have insomnia.      PHYSICAL EXAM:  Blood pressure 120/80, pulse 76, weight 169 lb (76.658 kg), last menstrual period 10/31/2015.    Vitals reviewed. Constitutional: She is oriented to person, place, and time. She appears well-developed and well-nourished.  HENT:  Head: Normocephalic and atraumatic.  Right Ear: External ear normal.  Left Ear: External ear normal.  Nose: Nose normal.  Mouth/Throat: Oropharynx is clear and moist.  Eyes: Conjunctivae and EOM are normal. Pupils are equal, round, and reactive to light. Right eye exhibits no discharge. Left eye exhibits no discharge. No scleral icterus.  Neck: Normal range of motion. Neck supple. No tracheal deviation present. No thyromegaly present.  Cardiovascular: Normal rate, regular rhythm, normal  heart sounds and intact distal pulses.  Exam reveals no gallop and no friction rub.   No murmur heard. Respiratory: Effort normal and breath sounds normal. No respiratory distress. She has no wheezes. She has no rales. She exhibits no tenderness.  GI: Soft. Bowel sounds are normal. She exhibits no distension and no mass. There  is tenderness. There is no rebound and no guarding.  Genitourinary:       Vulva is normal without lesions Vagina is pink moist without discharge Cervix normal in appearance and pap is normal Uterus is 14 weeks size uterus Adnexa is negative with normal sized ovaries by sonogram  Musculoskeletal: Normal range of motion. She exhibits no edema and no tenderness.  Neurological: She is alert and oriented to person, place, and time. She has normal reflexes. She displays normal reflexes. No cranial nerve deficit. She exhibits normal muscle tone. Coordination normal.  Skin: Skin is warm and dry. No rash noted. No erythema. No pallor.  Psychiatric: She has a normal mood and affect. Her behavior is normal. Judgment and thought content normal.    Labs: Results for orders placed or performed in visit on 03/03/16 (from the past 336 hour(s))  POCT urinalysis dipstick   Collection Time: 03/03/16  1:51 PM  Result Value Ref Range   Color, UA     Clarity, UA     Glucose, UA neg    Bilirubin, UA     Ketones, UA neg    Spec Grav, UA     Blood, UA neg    pH, UA     Protein, UA neg    Urobilinogen, UA     Nitrite, UA neg    Leukocytes, UA Negative Negative  Results for orders placed or performed in visit on 02/24/16 (from the past 336 hour(s))  POCT urinalysis dipstick   Collection Time: 02/24/16  4:22 PM  Result Value Ref Range   Color, UA     Clarity, UA     Glucose, UA neg    Bilirubin, UA     Ketones, UA neg    Spec Grav, UA     Blood, UA neg    pH, UA     Protein, UA neg    Urobilinogen, UA     Nitrite, UA neg    Leukocytes, UA Negative Negative  Maternal Screen, Integrated #1   Collection Time: 02/24/16  4:28 PM  Result Value Ref Range   Results Report    Test Results: Comment    Submit Part 2 Sample Using Date:    Crown Rump Length 64.1 mm   CRL Scan Date:    Sonographer ID# [id number]    Gest. Age on Collection Date 12.7 weeks   [age]D 20.2 [age]   Race Caucasian     Weight 166 lbs   Number of Fetuses 1    Nuchal Translucency (NT) 1.7 mm   PAPP-A Value 556.3 ng/mL   Comments: Comment    Note: Comment     EKG: No orders found for this or any previous visit.  Imaging Studies: [place] Ob Limited  02/24/2016  NUCHAL TRANSLUCENCY FOR INTEGRATED TESTING Shannan Harpershleigh Tuggle is in the office for nuchal translucency sonogram as part of an integrated screen. She is a 20 y.o. year old G3P0111 with Estimated Date of Delivery: 09/03/16 by early ultrasound now at  3160w4d weeks gestation. Thus far the pregnancy has been complicated by poor OB history (cervical insufficiency, PROM, PTD). GESTATION: SINGLETON FETAL ACTIVITY:  Heart rate         156 bpm          The fetus is active. CERVIX: Measures 4.0 cm, closed ADNEXA: The ovaries are normal. GESTATIONAL AGE AND  BIOMETRICS: Gestational criteria: Estimated Date of Delivery: 09/03/16 by early ultrasound now at [redacted]w[redacted]d Previous Scans:1    CROWN RUMP LENGTH           64.1 mm         12+5 weeks NUCHAL TRANSLUCENCY           1.7 mm         normal                                                                   AVERAGE EGA(BY THIS SCAN):  12+5 weeks The fetal nasal bone is identified.  TECHNOLOGIST COMMENTS: [place] 12+4 wks,cx 4 cm,post pl gr 0,crl 64.70mm,normal ov's bilat,fhr 156 bpm,NB present,NT 1.56mm,measurements c/w dates The patient will have the first blood draw of her integrated screening today and the second draw in approximately 4 weeks. Oleh Genin. Stalter, BS, RDMS, RVT 02/24/2016 4:46 PM                                                  US Fetal Nuchal Translucency Measurement  02/24/2016  NUCHAL TRANSLUCENCY FOR INTEGRATED TESTING Baelynn Schmuhl is in the office for nuchal translucency sonogram as part of an integrated screen. She is a 20 y.o. year old G3P0111 with Estimated Date of Delivery: 09/03/16 by early ultrasound now at  [redacted]w[redacted]d weeks gestation. Thus far the pregnancy has been complicated by poor OB history (cervical  insufficiency, PROM, PTD). GESTATION: SINGLETON FETAL ACTIVITY:          Heart rate         156 bpm          The fetus is active. CERVIX: Measures 4.0 cm, closed ADNEXA: The ovaries are normal. GESTATIONAL AGE AND  BIOMETRICS: Gestational criteria: Estimated Date of Delivery: 09/03/16 by early ultrasound now at [redacted]w[redacted]d Previous Scans:1    CROWN RUMP LENGTH           64.1 mm         12+5 weeks NUCHAL TRANSLUCENCY           1.7 mm         normal                                                                   AVERAGE EGA(BY THIS SCAN):  12+5 weeks The fetal nasal bone is identified.  TECHNOLOGIST COMMENTS: [place] 12+4 wks,cx 4 cm,post pl gr 0,crl 64.45mm,normal ov's bilat,fhr 156 bpm,NB present,NT 1.77mm,measurements c/w dates The patient will have the first blood draw of her integrated screening today and the second draw in approximately 4 weeks. Oleh Genin. Stalter, BS, RDMS, RVT 02/24/2016 4:46 PM  Assessment: [redacted]w[redacted]d Estimated Date of Delivery: 09/03/16  Cervical incompetence Patient Active Problem List   Diagnosis Date Noted  . Subclinical hypothyroidism 02/04/2016  . History of suicidal ideation 02/03/2016  . Supervision of normal pregnancy 01/27/2016  . History of preterm delivery, currently pregnant 01/27/2016  . PCOS (polycystic ovarian syndrome) 01/27/2016  . Depression with anxiety 01/27/2016  . Family history of systemic lupus erythematosus (SLE) in mother 01/27/2016    Plan: Placement of McDonald Cerclage  EURE,LUTHER H 03/03/2016 2:25 PM

## 2016-03-04 NOTE — Patient Instructions (Signed)
Tara Mcmahon  03/04/2016     @PREFPERIOPPHARMACY @   Your procedure is scheduled on 03/09/2016.  [facility] at [facility].  Call this number if you have problems the morning of surgery:  [phone]   Remember:  Do not eat food or drink liquids after midnight.  Take these medicines the morning of surgery with A SIP OF WATER    Do not wear jewelry, make-up or nail polish.  Do not wear lotions, powders, or perfumes.  You may wear deodorant.  Do not shave 48 hours prior to surgery.  Men may shave face and neck.  Do not bring valuables to the hospital.  [facility] is not responsible for any belongings or valuables.  Contacts, dentures or bridgework may not be worn into surgery.  Leave your suitcase in the car.  After surgery it may be brought to your room.  For patients admitted to the hospital, discharge time will be determined by your treatment team.  Patients discharged the day of surgery will not be allowed to drive home.   Name and phone number of your driver:   family Special instructions:  none  Please read over the following fact sheets that you were given. Care and Recovery After Surgery    Cervical Cerclage Cervical cerclage is a surgical procedure for an incompetent cervix. An incompetent cervix is a weak cervix that opens up before labor begins. Cervical cerclage is a procedure in which the cervix is sewn closed during pregnancy.  LET [facility] PROVID[facility] KNOW ABOUT:   Any allergies you have.  All medicines you are taking, including vitamins, herbs, eye drops, creams, and over-the-counter medicines.  Previous problems you or members of your family have had with the use of anesthetics.  Any blood disorders you have.  Previous surgeries you have had.  Medical conditions you have.  Any recent colds or infections. RISKS AND COMPLICATIONS  Generally, this is a safe procedure. However, as with any procedure, problems can occur. Possible problems  include:  Infection.  Bleeding.  Rupturing the amniotic sac (membranes).  Going into early labor and delivery.  Problems with the anesthetics.  Infection of the amniotic sac. BEFORE THE PROCEDURE   Ask your health care provider about changing or stopping your medicines.  Do not eat or drink anything for 6-8 hours before the procedure.  Arrange for someone to drive you home after the procedure. PROCEDURE   An IV tube will be placed in your vein. You will be given a sedative to help you relax.  You will be given a medicine that makes you sleep through the procedure (general anesthetic) or a medicine injected into your spine that numbs your body below the waist (spinal or epidural anesthetic). You will be asleep or be numbed through the entire procedure.  A speculum will be placed in your vagina to visualize your cervix.  The cervix is then grasped and stitched closed tightly.  Ultrasound may be used to guide the procedure and monitor the baby. AFTER THE PROCEDURE   You will go to a recovery room where you and your unborn baby are monitored. Once you are awake, stable, and taking fluids well, you will be allowed to return to your room.  You will usually stay in the hospital overnight.  You may get an injection of progesterone to prevent uterine contractions.  You may be given pain-relieving medicines to take with you when you go home.  Have someone drive you home  and stay with you for up to 2 days.   This information is not intended to replace advice given to you by your health care provider. Make sure you discuss any questions you have with your health care provider.   Document Released: 10/27/2008 Document Revised: 11/19/2013 Document Reviewed: 06/05/2013 Elsevier Interactive Patient Education [employer]

## 2016-03-05 LAB — STREP GP B NAA: STREP GROUP B AG: NEGATIVE

## 2016-03-06 ENCOUNTER — Encounter: Payer: Self-pay | Admitting: Obstetrics & Gynecology

## 2016-03-06 ENCOUNTER — Encounter (HOSPITAL_COMMUNITY): Payer: Self-pay | Admitting: *Deleted

## 2016-03-06 ENCOUNTER — Inpatient Hospital Stay (HOSPITAL_COMMUNITY)
Admission: AD | Admit: 2016-03-06 | Discharge: 2016-03-06 | Disposition: A | Discharge status: Home / Self Care | Payer: Medicaid Other | Source: Ambulatory Visit | Attending: Obstetrics & Gynecology | Admitting: Obstetrics & Gynecology

## 2016-03-06 ENCOUNTER — Inpatient Hospital Stay (HOSPITAL_COMMUNITY): Payer: Medicaid Other

## 2016-03-06 ENCOUNTER — Emergency Department (HOSPITAL_COMMUNITY)
Admission: EM | Admit: 2016-03-06 | Discharge: 2016-03-06 | Discharge status: Against Medical Advice | Payer: Medicaid Other | Source: Home / Self Care | Attending: Emergency Medicine | Admitting: Emergency Medicine

## 2016-03-06 DIAGNOSIS — M549 Dorsalgia, unspecified: Secondary | ICD-10-CM

## 2016-03-06 DIAGNOSIS — R1032 Left lower quadrant pain: Secondary | ICD-10-CM | POA: Insufficient documentation

## 2016-03-06 DIAGNOSIS — N949 Unspecified condition associated with female genital organs and menstrual cycle: Secondary | ICD-10-CM

## 2016-03-06 DIAGNOSIS — O26899 Other specified pregnancy related conditions, unspecified trimester: Secondary | ICD-10-CM

## 2016-03-06 DIAGNOSIS — E039 Hypothyroidism, unspecified: Secondary | ICD-10-CM | POA: Insufficient documentation

## 2016-03-06 DIAGNOSIS — Z79899 Other long term (current) drug therapy: Secondary | ICD-10-CM | POA: Diagnosis not present

## 2016-03-06 DIAGNOSIS — F419 Anxiety disorder, unspecified: Secondary | ICD-10-CM | POA: Insufficient documentation

## 2016-03-06 DIAGNOSIS — O9989 Other specified diseases and conditions complicating pregnancy, childbirth and the puerperium: Secondary | ICD-10-CM

## 2016-03-06 DIAGNOSIS — R102 Pelvic and perineal pain: Secondary | ICD-10-CM | POA: Diagnosis not present

## 2016-03-06 DIAGNOSIS — Z3492 Encounter for supervision of normal pregnancy, unspecified, second trimester: Secondary | ICD-10-CM

## 2016-03-06 DIAGNOSIS — R109 Unspecified abdominal pain: Secondary | ICD-10-CM

## 2016-03-06 DIAGNOSIS — Z3A14 14 weeks gestation of pregnancy: Secondary | ICD-10-CM | POA: Insufficient documentation

## 2016-03-06 DIAGNOSIS — O26892 Other specified pregnancy related conditions, second trimester: Secondary | ICD-10-CM | POA: Insufficient documentation

## 2016-03-06 DIAGNOSIS — F329 Major depressive disorder, single episode, unspecified: Secondary | ICD-10-CM | POA: Insufficient documentation

## 2016-03-06 HISTORY — DX: Maternal care for cervical incompetence, unspecified trimester: O34.30

## 2016-03-06 LAB — URINALYSIS, ROUTINE W REFLEX MICROSCOPIC
BILIRUBIN URINE: NEGATIVE
GLUCOSE, UA: NEGATIVE mg/dL
HGB URINE DIPSTICK: NEGATIVE
KETONES UR: NEGATIVE mg/dL
LEUKOCYTES UA: NEGATIVE
Nitrite: NEGATIVE
PH: 7 (ref 5.0–8.0)
Protein, ur: NEGATIVE mg/dL
Specific Gravity, Urine: <1.005 — ABNORMAL LOW (ref 1.005–1.030)

## 2016-03-06 NOTE — ED Notes (Addendum)
Pt c/o lower abdominal pain and lower back pain x 1 hr. Pt states she is [redacted] wks pregnant. EDD 09-03-2016. Pt states she has cervical incompetence and is having a cervical cerclage on Wednesday. Pt reports "standard pregnancy" nausea. This is not a new symptom.

## 2016-03-06 NOTE — MAU Note (Signed)
Started having pelvic pressure and pain after relaxing all day. History of early delivery at 26 weeks and m/c

## 2016-03-06 NOTE — Discharge Instructions (Signed)

## 2016-03-06 NOTE — ED Notes (Addendum)
Pt stated that she may leave AMA once her urine results come back.  Pt initially asking how long it will take and explained to pt about 30 minutes.  Pt stated that since we dont have u/s here to check her cervix that she will call her doctor first thing in morning about it.  EDP made aware of pt may leave AMA.

## 2016-03-06 NOTE — ED Provider Notes (Signed)
CSN: [id number]     Arrival date & time 03/06/16  1956 History  By signing my name below, I, Tara Mcmahon, attest that this documentation has been prepared under the direction and in the presence of Bethann Berkshire, MD. Electronically Signed: Phillis Mcmahon, ED Scribe. 03/06/2016. 8:27 PM.    Chief Complaint  Patient presents with  . Abdominal Pain   Patient is a 20 y.o. female presenting with abdominal pain. The history is provided by the patient. No language interpreter was used.  Abdominal Pain Pain location:  LLQ Pain quality: cramping   Pain radiates to:  Back Pain severity:  Moderate Onset quality:  Sudden Duration:  1 hour Timing:  Constant Progression:  Worsening Chronicity:  New Ineffective treatments:  None tried Associated symptoms: no chest pain, no cough, no diarrhea, no fatigue, no hematuria and no vaginal bleeding   Risk factors: pregnancy   HPI Comments: Tara Mcmahon is a G27P1 20 y.o. Female with a hx of PCOS, cervical incompetence, and preterm birth who is currently [redacted] weeks pregnant who presents to the Emergency Department complaining of cramping lower abdominal pain and lower back pain onset one hour ago. Pt has been evaluated by OB/GYN since being pregnant. She was referred to the ED due to her past pregnancy hx. She denies vaginal bleeding.   Past Medical History  Diagnosis Date  . PCOS (polycystic ovarian syndrome)   . Depression   . Anxiety   . Cervical incompetence during pregnancy    Past Surgical History  Procedure Laterality Date  . Frenulectomy, lower labial    . Cervical cerclage     Family History  Problem Relation Age of Onset  . Arthritis Mother   . Lupus Mother   . Autoimmune disease Mother   . Cancer Maternal Grandmother     breast  . Heart disease Maternal Grandfather   . Heart disease Paternal Grandfather   . Hypertension Paternal Grandfather    Social History  Substance Use Topics  . Smoking status: Never Smoker   . Smokeless  tobacco: Never Used  . Alcohol Use: No   OB History    Gravida Para Term Preterm AB TAB SAB Ectopic Multiple Living   Review of Systems  Constitutional: Negative for appetite change and fatigue.  HENT: Negative for congestion, ear discharge and sinus pressure.   Eyes: Negative for discharge.  Respiratory: Negative for cough.   Cardiovascular: Negative for chest pain.  Gastrointestinal: Positive for abdominal pain. Negative for diarrhea.  Genitourinary: Negative for frequency, hematuria and vaginal bleeding.  Musculoskeletal: Positive for back pain.  Skin: Negative for rash.  Neurological: Negative for seizures and headaches.  Psychiatric/Behavioral: Negative for hallucinations.      Allergies  Review of patient's allergies indicates no known allergies.  Home Medications   Prior to Admission medications   Medication Sig Start Date End Date Taking? Authorizing Provider  levothyroxine (SYNTHROID) 50 MCG tablet Take 1 tablet (50 mcg total) by mouth daily before breakfast. 02/03/16   Cheral Marker, CNM  Prenatal Vit-Fe Fumarate-FA (PRENATAL MULTIVITAMIN) TABS tablet Take 1 tablet by mouth daily.     Historical Provider, MD  progesterone (PROMETRIUM) 200 MG capsule Place 1 capsule nightly 02/24/16   Lazaro Arms, MD   BP 136/82 mmHg  Pulse 113  Temp(Src) 98.4 F (36.9 C) (Oral)  Resp 16  Ht  (1.499 m)  Wt 166 lb (75.297 kg)  BMI 33.51 kg/m2  SpO2 100%  LMP 10/31/2015 Physical Exam  Constitutional: She is oriented to person, place, and time. She appears well-developed.  HENT:  Head: Normocephalic.  Eyes: Conjunctivae and EOM are normal. No scleral icterus.  Neck: Neck supple. No thyromegaly present.  Cardiovascular: Normal rate and regular rhythm.  Exam reveals no gallop and no friction rub.   No murmur heard. Pulmonary/Chest: No stridor. She has no wheezes. She has no rales. She exhibits no tenderness.  Abdominal: She exhibits no distension.  There is tenderness in the suprapubic area. There is no rebound.  Mild suprapubic tenderness  Musculoskeletal: Normal range of motion. She exhibits no edema.  Lymphadenopathy:    She has no cervical adenopathy.  Neurological: She is oriented to person, place, and time. She exhibits normal muscle tone. Coordination normal.  Skin: No rash noted. No erythema.  Psychiatric: She has a normal mood and affect. Her behavior is normal.    ED Course  Procedures (including critical care time) DIAGNOSTIC STUDIES: Oxygen Saturation is 100% on RA, normal by my interpretation.    COORDINATION OF CARE: 8:23 PM-Discussed treatment plan which includes IV fluids, labs, and pelvic exam with pt at bedside and pt agreed to plan.    Labs Review Labs Reviewed  URINALYSIS, ROUTINE W REFLEX MICROSCOPIC (NOT AT [facility])    Imaging Review No results found. I have personally reviewed and evaluated these images and lab results as part of my medical decision-making.   EKG Interpretation None      MDM   Final diagnoses:  None   The patient is [redacted] weeks pregnant and having suprapubic pain. She has lost a pregnancy before and is scheduled for cerclage in a couple days. The patient wanted an ultrasound of her cervix done tonight total we didn't have that available. I told her we would check urine and blood work and I would call the GYN doctor speak to him. The GYN doctor on-call is her doctor. The patient decided she would leave AMA   The chart was scribed for me under my direct supervision.  I personally performed the history, physical, and medical decision making and all procedures in the evaluation of this patient..  The chart was scribed for me under my direct supervision.  I personally performed the history, physical, and medical decision making and all procedures in the evaluation of this patient.Bethann Berkshire.   Dontea Corlew, MD 03/06/16 2055

## 2016-03-06 NOTE — MAU Provider Note (Signed)
History   [id number]   Chief Complaint  Patient presents with  . Abdominal Pain    HPI Tara Mcmahon is a 20 y.o. female  [id number] at 4747w1d IUP here with report of lower pelvic intermittent pain that started yesterday.  Also reports feeling increased "air" in vagina.  "Feels like something there".  Denies vaginal bleeding or abnormal discharge.  Hx of incompetent cervix with rescue cerclage placed at 21 weeks, with PPROM two days later.  Delivered at 26 weeks.  Cerclage scheduled to be placed on 03/09/16 by Dr. Despina HiddenEure.    Patient's last menstrual period was 10/31/2015.  OB History  Gravida Para Term Preterm AB SAB TAB Ectopic Multiple Living  3 1  1 1 1    1     # Outcome Date GA Lbr Len/2nd Weight Sex Delivery Anes PTL Lv  3 Current           2 Preterm 09/19/12 162w0d  0.822 kg (1 lb 13 oz) F Vag-Spont  Y Y  1 SAB               Past Medical History  Diagnosis Date  . PCOS (polycystic ovarian syndrome)   . Depression   . Anxiety   . Cervical incompetence during pregnancy     Family History  Problem Relation Age of Onset  . Arthritis Mother   . Lupus Mother   . Autoimmune disease Mother   . Cancer Maternal Grandmother     breast  . Heart disease Maternal Grandfather   . Heart disease Paternal Grandfather   . Hypertension Paternal Grandfather     Social History   Social History  . Marital Status: Married    Spouse Name: N/A  . Number of Children: N/A  . Years of Education: N/A   Social History Main Topics  . Smoking status: Never Smoker   . Smokeless tobacco: Never Used  . Alcohol Use: No  . Drug Use: No  . Sexual Activity: Not Currently   Other Topics Concern  . None   Social History Narrative    No Known Allergies  No current facility-administered medications on file prior to encounter.   Current Outpatient Prescriptions on File Prior to Encounter  Medication Sig Dispense Refill  . levothyroxine (SYNTHROID) 50 MCG tablet Take 1 tablet (50 mcg total)  by mouth daily before breakfast. 30 tablet 3  . Prenatal Vit-Fe Fumarate-FA (PRENATAL MULTIVITAMIN) TABS tablet Take 1 tablet by mouth daily.     . progesterone (PROMETRIUM) 200 MG capsule Place 1 capsule nightly 30 capsule 11     Review of Systems  Genitourinary: Positive for pelvic pain (intermittent sharp pain; pelvic pressure). Negative for vaginal bleeding and vaginal discharge.  All other systems reviewed and are negative.    Physical Exam   Filed Vitals:   03/06/16 2150  BP: 136/77  Pulse: 114  Temp: 98.7 F (37.1 C)  Resp: 18  Height: 4\' 11"  (1.499 m)  Weight: 77.111 kg (170 lb)    Physical Exam  Constitutional: She is oriented to person, place, and time. She appears well-developed and well-nourished. No distress.  HENT:  Head: Normocephalic.  Neck: Normal range of motion. Neck supple.  Cardiovascular: Normal rate, regular rhythm and normal heart sounds.   Respiratory: Effort normal and breath sounds normal. No respiratory distress.  GI: Soft. She exhibits no mass. There is no tenderness. There is no rebound and no guarding.  Genitourinary: Uterus is enlarged. Right adnexum displays no mass, no  tenderness and no fullness. Left adnexum displays no mass, no tenderness and no fullness. No bleeding in the vagina. Vaginal discharge (mucus discharge) found.  Nothing visually seen in vagina  Musculoskeletal: Normal range of motion.  Neurological: She is alert and oriented to person, place, and time.  Skin: Skin is warm and dry.    MAU Course  Procedures Results for orders placed or performed during the hospital encounter of 03/06/16 (from the past 24 hour(s))  Urinalysis, Routine w reflex microscopic (not at [facility])     Status: Abnormal   Collection Time: 03/06/16  8:18 PM  Result Value Ref Range   Color, Urine YELLOW YELLOW   APPearance CLEAR CLEAR   Specific Gravity, Urine <1.005 (L) 1.005 - 1.030   pH 7.0 5.0 - 8.0   Glucose, UA NEGATIVE NEGATIVE mg/dL   Hgb urine  dipstick NEGATIVE NEGATIVE   Bilirubin Urine NEGATIVE NEGATIVE   Ketones, ur NEGATIVE NEGATIVE mg/dL   Protein, ur NEGATIVE NEGATIVE mg/dL   Nitrite NEGATIVE NEGATIVE   Leukocytes, UA NEGATIVE NEGATIVE   Ultrasound (preliminary): Cervical length 3.4 cm  Assessment and Plan  19 y.o. [id number] at [redacted]w[redacted]d IUP  Pelvic Pain in Pregnancy - normal exam/ultrasound  Plan: Discharge home Provided reassurance Cerclage placement on 03/09/16 Reviewed pregnancy precautions  [name], CNM 03/06/2016 11:27 PM

## 2016-03-06 NOTE — ED Notes (Signed)
Pt. Refused to have labs drawn, went in room and pt was getting dressed. Stated she was leaving [facility]

## 2016-03-06 NOTE — ED Notes (Signed)
MD at bedside. 

## 2016-03-07 ENCOUNTER — Encounter (HOSPITAL_COMMUNITY)
Admission: RE | Admit: 2016-03-07 | Discharge: 2016-03-07 | Disposition: A | Discharge status: Home / Self Care | Payer: Medicaid Other | Source: Ambulatory Visit | Attending: Obstetrics & Gynecology | Admitting: Obstetrics & Gynecology

## 2016-03-07 ENCOUNTER — Encounter (HOSPITAL_COMMUNITY): Payer: Self-pay

## 2016-03-07 DIAGNOSIS — Z01812 Encounter for preprocedural laboratory examination: Secondary | ICD-10-CM | POA: Diagnosis present

## 2016-03-07 HISTORY — DX: Hypothyroidism, unspecified: E03.9

## 2016-03-07 LAB — COMPREHENSIVE METABOLIC PANEL
ALBUMIN: 3.4 g/dL — AB (ref 3.5–5.0)
ALT: 29 U/L (ref 14–54)
AST: 25 U/L (ref 15–41)
Alkaline Phosphatase: 47 U/L (ref 38–126)
Anion gap: 8 (ref 5–15)
BUN: 7 mg/dL (ref 6–20)
CHLORIDE: 104 mmol/L (ref 101–111)
CO2: 22 mmol/L (ref 22–32)
CREATININE: 0.52 mg/dL (ref 0.44–1.00)
Calcium: 8.6 mg/dL — ABNORMAL LOW (ref 8.9–10.3)
GFR calc Af Amer: >60 mL/min (ref >60)
GFR calc non Af Amer: >60 mL/min (ref >60)
GLUCOSE: 114 mg/dL — AB (ref 65–99)
POTASSIUM: 3.9 mmol/L (ref 3.5–5.1)
Sodium: 134 mmol/L — ABNORMAL LOW (ref 135–145)
Total Bilirubin: 0.3 mg/dL (ref 0.3–1.2)
Total Protein: 6.6 g/dL (ref 6.5–8.1)

## 2016-03-07 LAB — URINALYSIS, ROUTINE W REFLEX MICROSCOPIC
BILIRUBIN URINE: NEGATIVE
GLUCOSE, UA: NEGATIVE mg/dL
HGB URINE DIPSTICK: NEGATIVE
Ketones, ur: NEGATIVE mg/dL
Leukocytes, UA: NEGATIVE
Nitrite: NEGATIVE
PH: 5.5 (ref 5.0–8.0)
Protein, ur: NEGATIVE mg/dL

## 2016-03-07 LAB — CBC
HEMATOCRIT: 29.9 % — AB (ref 36.0–46.0)
Hemoglobin: 10.7 g/dL — ABNORMAL LOW (ref 12.0–15.0)
MCH: 29.8 pg (ref 26.0–34.0)
MCHC: 35.8 g/dL (ref 30.0–36.0)
MCV: 83.3 fL (ref 78.0–100.0)
PLATELETS: 314 10*3/uL (ref 150–400)
RBC: 3.59 MIL/uL — AB (ref 3.87–5.11)
RDW: 12.8 % (ref 11.5–15.5)
WBC: 10.8 10*3/uL — AB (ref 4.0–10.5)

## 2016-03-07 NOTE — Pre-Procedure Instructions (Signed)
Patient given information to sign up for my chart at home. 

## 2016-03-09 ENCOUNTER — Encounter (HOSPITAL_COMMUNITY): Payer: Self-pay | Admitting: *Deleted

## 2016-03-09 ENCOUNTER — Ambulatory Visit (HOSPITAL_COMMUNITY): Payer: Medicaid Other | Admitting: Anesthesiology

## 2016-03-09 ENCOUNTER — Ambulatory Visit (HOSPITAL_COMMUNITY)
Admission: RE | Admit: 2016-03-09 | Discharge: 2016-03-09 | Disposition: A | Discharge status: Home / Self Care | Payer: Medicaid Other | Source: Ambulatory Visit | Attending: Obstetrics & Gynecology | Admitting: Obstetrics & Gynecology

## 2016-03-09 ENCOUNTER — Encounter (HOSPITAL_COMMUNITY)
Admission: RE | Disposition: A | Discharge status: Home / Self Care | Payer: Self-pay | Source: Ambulatory Visit | Attending: Obstetrics & Gynecology

## 2016-03-09 DIAGNOSIS — F329 Major depressive disorder, single episode, unspecified: Secondary | ICD-10-CM | POA: Insufficient documentation

## 2016-03-09 DIAGNOSIS — N883 Incompetence of cervix uteri: Secondary | ICD-10-CM | POA: Diagnosis not present

## 2016-03-09 DIAGNOSIS — F419 Anxiety disorder, unspecified: Secondary | ICD-10-CM | POA: Insufficient documentation

## 2016-03-09 DIAGNOSIS — O3432 Maternal care for cervical incompetence, second trimester: Secondary | ICD-10-CM | POA: Diagnosis present

## 2016-03-09 DIAGNOSIS — Z79899 Other long term (current) drug therapy: Secondary | ICD-10-CM | POA: Insufficient documentation

## 2016-03-09 DIAGNOSIS — Z8759 Personal history of other complications of pregnancy, childbirth and the puerperium: Secondary | ICD-10-CM | POA: Insufficient documentation

## 2016-03-09 DIAGNOSIS — E039 Hypothyroidism, unspecified: Secondary | ICD-10-CM | POA: Diagnosis not present

## 2016-03-09 HISTORY — PX: CERVICAL CERCLAGE: SHX1329

## 2016-03-09 SURGERY — CERCLAGE, CERVIX, VAGINAL APPROACH
Anesthesia: Spinal | Site: Cervix

## 2016-03-09 MED ORDER — FENTANYL CITRATE (PF) 100 MCG/2ML IJ SOLN
INTRAMUSCULAR | Status: AC
  Filled 2016-03-09: qty 2

## 2016-03-09 MED ORDER — SODIUM CHLORIDE 0.9 % IJ SOLN
INTRAMUSCULAR | Status: AC
  Filled 2016-03-09: qty 10

## 2016-03-09 MED ORDER — FENTANYL CITRATE (PF) 100 MCG/2ML IJ SOLN
INTRAMUSCULAR | Status: DC | PRN
  Administered 2016-03-09 (×2): 25 ug via INTRAVENOUS

## 2016-03-09 MED ORDER — ONDANSETRON HCL 4 MG/2ML IJ SOLN: 4.0000 mg | Freq: Once | INTRAMUSCULAR | Status: DC | PRN

## 2016-03-09 MED ORDER — MIDAZOLAM HCL 2 MG/2ML IJ SOLN
INTRAMUSCULAR | Status: AC
  Filled 2016-03-09: qty 2

## 2016-03-09 MED ORDER — BUPIVACAINE IN DEXTROSE 0.75-8.25 % IT SOLN
INTRATHECAL | Status: AC
  Filled 2016-03-09: qty 2

## 2016-03-09 MED ORDER — MIDAZOLAM HCL 2 MG/2ML IJ SOLN
1.0000 mg | INTRAMUSCULAR | Status: DC | PRN
  Administered 2016-03-09 (×2): 1 mg via INTRAVENOUS

## 2016-03-09 MED ORDER — CEFAZOLIN SODIUM-DEXTROSE 2-4 GM/100ML-% IV SOLN
INTRAVENOUS | Status: AC
  Filled 2016-03-09: qty 100

## 2016-03-09 MED ORDER — ONDANSETRON HCL 8 MG PO TABS: 8.0000 mg | ORAL_TABLET | Freq: Three times a day (TID) | ORAL | Status: DC | PRN

## 2016-03-09 MED ORDER — FENTANYL CITRATE (PF) 100 MCG/2ML IJ SOLN
25.0000 ug | INTRAMUSCULAR | Status: AC
  Administered 2016-03-09: 25 ug via INTRAVENOUS

## 2016-03-09 MED ORDER — CEFAZOLIN SODIUM-DEXTROSE 2-4 GM/100ML-% IV SOLN
2.0000 g | INTRAVENOUS | Status: AC
  Administered 2016-03-09: 2 g via INTRAVENOUS

## 2016-03-09 MED ORDER — FENTANYL CITRATE (PF) 100 MCG/2ML IJ SOLN: 25.0000 ug | INTRAMUSCULAR | Status: DC | PRN

## 2016-03-09 MED ORDER — 0.9 % SODIUM CHLORIDE (POUR BTL) OPTIME
TOPICAL | Status: DC | PRN
  Administered 2016-03-09: 1000 mL

## 2016-03-09 MED ORDER — EPHEDRINE SULFATE 50 MG/ML IJ SOLN
INTRAMUSCULAR | Status: AC
  Filled 2016-03-09: qty 1

## 2016-03-09 MED ORDER — MIDAZOLAM HCL 5 MG/5ML IJ SOLN
INTRAMUSCULAR | Status: DC | PRN
  Administered 2016-03-09: 1 mg via INTRAVENOUS

## 2016-03-09 MED ORDER — PROPOFOL 10 MG/ML IV BOLUS
INTRAVENOUS | Status: AC
  Filled 2016-03-09: qty 20

## 2016-03-09 MED ORDER — LACTATED RINGERS IV SOLN
INTRAVENOUS | Status: DC
  Administered 2016-03-09: 11:00:00 via INTRAVENOUS

## 2016-03-09 MED ORDER — HYDROCODONE-ACETAMINOPHEN 5-325 MG PO TABS: 1.0000 | ORAL_TABLET | Freq: Four times a day (QID) | ORAL | Status: DC | PRN

## 2016-03-09 SURGICAL SUPPLY — 24 items
BAG HAMPER (MISCELLANEOUS) ×2 IMPLANT
CLOTH BEACON ORANGE TIMEOUT ST (SAFETY) ×2 IMPLANT
COVER LIGHT HANDLE STERIS (MISCELLANEOUS) ×4 IMPLANT
GAUZE SPONGE 4X4 16PLY XRAY LF (GAUZE/BANDAGES/DRESSINGS) ×2 IMPLANT
GLOVE BIOGEL PI IND STRL 7.0 (GLOVE) ×2 IMPLANT
GLOVE BIOGEL PI IND STRL 8 (GLOVE) ×1 IMPLANT
GLOVE BIOGEL PI INDICATOR 7.0 (GLOVE) ×2
GLOVE BIOGEL PI INDICATOR 8 (GLOVE) ×1
GLOVE ECLIPSE 6.5 STRL STRAW (GLOVE) ×2 IMPLANT
GLOVE ECLIPSE 8.0 STRL XLNG CF (GLOVE) ×2 IMPLANT
GLOVE EXAM NITRILE MD LF STRL (GLOVE) ×2 IMPLANT
GOWN STRL REUS W/TWL LRG LVL3 (GOWN DISPOSABLE) ×6 IMPLANT
GOWN STRL REUS W/TWL XL LVL3 (GOWN DISPOSABLE) ×2 IMPLANT
KIT ROOM TURNOVER APOR (KITS) ×2 IMPLANT
MANIFOLD NEPTUNE II (INSTRUMENTS) ×2 IMPLANT
NS IRRIG 1000ML POUR BTL (IV SOLUTION) ×2 IMPLANT
PACK PERI GYN (CUSTOM PROCEDURE TRAY) ×2 IMPLANT
PAD ARMBOARD 7.5X6 YLW CONV (MISCELLANEOUS) ×2 IMPLANT
SET BASIN LINEN APH (SET/KITS/TRAYS/PACK) ×2 IMPLANT
SUT MERSILENE FIBER S 5 CTX 12 (SUTURE) ×2 IMPLANT
SUT PROLENE 1 CT 1 30 (SUTURE) ×2 IMPLANT
SUT SILK 2 0 SH (SUTURE) ×2 IMPLANT
TOWEL OR 17X26 4PK STRL BLUE (TOWEL DISPOSABLE) ×2 IMPLANT
TRAY FOLEY CATH SILVER 16FR (SET/KITS/TRAYS/PACK) ×2 IMPLANT

## 2016-03-09 NOTE — Transfer of Care (Signed)
Immediate Anesthesia Transfer of Care Note  Patient: Tara Mcmahon  Procedure(s) Performed: Procedure(s): MCDONALD CERCLAGE PLACEMENT (N/A)  Patient Location: PACU  Anesthesia Type:General  Level of Consciousness: awake, alert  and oriented  Airway & Oxygen Therapy: Patient Spontanous Breathing  Post-op Assessment: Report given to RN  Post vital signs: Reviewed and stable  Last Vitals:  Filed Vitals:   03/09/16 1105 03/09/16 1110  BP: 107/62 102/65  Pulse:    Temp:    Resp: 19 14    Complications: No apparent anesthesia complications

## 2016-03-09 NOTE — Anesthesia Postprocedure Evaluation (Signed)
Anesthesia Post Note  Patient: Public house managerAshleigh Brookover  Procedure(s) Performed: Procedure(s) (LRB): MCDONALD CERCLAGE PLACEMENT (N/A)  Patient location during evaluation: PACU Anesthesia Type: Spinal Level of consciousness: awake and alert and oriented Pain management: pain level controlled Vital Signs Assessment: post-procedure vital signs reviewed and stable Respiratory status: spontaneous breathing Cardiovascular status: blood pressure returned to baseline Postop Assessment: patient able to bend at knees, no signs of nausea or vomiting and no headache Anesthetic complications: no    Last Vitals:  Filed Vitals:   03/09/16 1300 03/09/16 1315  BP: 115/72 106/62  Pulse: 83 87  Temp:    Resp: 12 13    Last Pain: There were no vitals filed for this visit.               Liandro Thelin

## 2016-03-09 NOTE — Progress Notes (Signed)
Able to stand and take steps without difficulty.

## 2016-03-09 NOTE — Progress Notes (Signed)
FHR 148

## 2016-03-09 NOTE — Progress Notes (Addendum)
FHR 147 per Dr Despina HiddenEure.

## 2016-03-09 NOTE — Op Note (Signed)
Preoperative diagnosis:  1.  Intrauterine pregnancy at 8768w4d  weeks gestation                                         2.  history of cervical incompetence with previous pregnancy, opts for elective Cerclage placement  Postoperative diagnosis:  Same as above  Procedure:  Placement of a McDonald cerclage, using 5 mm Mersilene tape  Surgeon:  Rockne CoonsLuther H Madysun Thall Jr MD  Anesthesia:  Spinal using Marcaine  Findings:  Tara HarperAshleigh Imm  is 568w4d  weeks pregnant with her second pregnancy.  She experienced cervical incompetence with her last pregnancy resulting in an emergency cerclage placement and ultimately PPROM with delivery.   Today intraoperatively her cervix is patulous soft mucousy and bleeds easily.  It does appear shortened at least to the level of the bladder reflection.  I was able to get a good 3 length circumferentially tied down to a tight fingertip.  Description of operation:  Patient was taken to the operating room where she underwent a spinal anesthetic in the sitting position.  She was then placed in the dorsal lithotomy position using candycane stirrups.  I then prepped and draped in the lower abdomen inner thighs perineum perirectal and vaginal area.  A Foley catheter was placed.  A medium length weighted speculum was placed posteriorly.  2 Deaver retractors were used anteriorly and laterally for retraction and visualization.  A 5 mm Mersilene tape was employed.  It was placed in a counterclockwise fashion at 5 points circumferentially about the cervix.  It was then tied down to a tight fingertip tension at 12:00.  To facilitate future removal a 2-0 silk suture was placed above the knot and tied down.  The Mersilene tape and were then cut.  Because her cervix was  patulous it bled easily.  Pressure and was used to control the bleeding.  It was all from the needle points of placement   The patient tolerated the procedure well.  She experienced approximately 25 cc of blood loss.  She was taken  to the PACU in good stable condition with all counts being correct.  She received a gram of Ancef preoperatively prophylactically. She will be seen in the office next week for her postoperative visit  Lazaro ArmsEURE,Marris Frontera H, MD Attending Physician for the [facility] 03/09/2016 12:14 PM

## 2016-03-09 NOTE — Anesthesia Preprocedure Evaluation (Addendum)
Anesthesia Evaluation  Patient identified by MRN, date of birth, ID band Patient awake    Reviewed: Allergy & Precautions, NPO status , Patient's Chart, lab work & pertinent test results  Airway Mallampati: II  TM Distance: >3 FB     Dental  (+) Teeth Intact, Dental Advisory Given   Pulmonary neg pulmonary ROS,    breath sounds clear to auscultation       Cardiovascular negative cardio ROS   Rhythm:Regular Rate:Normal     Neuro/Psych PSYCHIATRIC DISORDERS Anxiety Depression    GI/Hepatic negative GI ROS,   Endo/Other  Hypothyroidism   Renal/GU      Musculoskeletal   Abdominal   Peds  Hematology   Anesthesia Other Findings   Reproductive/Obstetrics (+) Pregnancy (IUG at 14 wks)                            Anesthesia Physical Anesthesia Plan  ASA: II  Anesthesia Plan: Spinal   Post-op Pain Management:    Induction:   Airway Management Planned: Simple Face Mask  Additional Equipment:   Intra-op Plan:   Post-operative Plan:   Informed Consent: I have reviewed the patients History and Physical, chart, labs and discussed the procedure including the risks, benefits and alternatives for the proposed anesthesia with the patient or authorized representative who has indicated his/her understanding and acceptance.     Plan Discussed with:   Anesthesia Plan Comments:         Anesthesia Quick Evaluation

## 2016-03-09 NOTE — Discharge Instructions (Signed)
Cervical Cerclage, Care After °Refer to this sheet in the next few weeks. These instructions provide you with information on caring for yourself after your procedure. Your health care provider may also give you more specific instructions. Your treatment has been planned according to current medical practices, but problems sometimes occur. Call your health care provider if you have any problems or questions after your procedure. °WHAT TO EXPECT AFTER THE PROCEDURE  °After your procedure, it is typical to have the following: °· Abdominal cramping. °· Vaginal spotting. °HOME CARE INSTRUCTIONS  °· Only take over-the-counter or prescription medicines for pain, discomfort, or fever as directed by your health care provider. °· Avoid physical activities and exercise until your health care provider says it is okay. °· Do not douche or have sexual intercourse until your health care provider tells you it is okay. °· Keep your follow-up surgical and prenatal appointments with your health care provider. °SEEK MEDICAL CARE IF:  °· You have abnormal vaginal discharge. °· You have a rash. °· You become lightheaded or feel faint. °· You have abdominal pain that is not controlled with pain medicine. °SEEK IMMEDIATE MEDICAL CARE IF:  °· You develop vaginal bleeding. °· You are leaking fluid or have a gush of fluid from the vagina. °· You have a fever. °· You faint. °· You have uterine contractions. °· You feel your baby is not moving as much as usual, or you cannot feel your baby move. °· You have chest pain or shortness of breath. °  °This information is not intended to replace advice given to you by your health care provider. Make sure you discuss any questions you have with your health care provider. °  °Document Released: 09/04/2013 Document Revised: 11/19/2013 Document Reviewed: 09/04/2013 °Elsevier Interactive Patient Education ©2016 [employer] ° °

## 2016-03-09 NOTE — OR Nursing (Signed)
Heart ate 125 via ultrasound doppler  ,  Rate 125,  Reported to Dr. Despina HiddenEure

## 2016-03-09 NOTE — Progress Notes (Signed)
FHR144

## 2016-03-09 NOTE — Progress Notes (Addendum)
Able to stand without assistance. Right leg strong but left leg still weak. Assisted back to stretcher. FHR 147.

## 2016-03-09 NOTE — H&P (Signed)
Preoperative History and Physical  Tara Mcmahon is a 20 y.o. [phone] with Patient's last menstrual period was 10/31/2015. admitted for a placement of McDonald cerclage due to history of incompetent cervix.    PMH:  Past Medical History  Diagnosis Date  . PCOS (polycystic ovarian syndrome)   . Depression   . Anxiety     PSH:  Past Surgical History  Procedure Laterality Date  . Frenulectomy, lower labial    . Cervical cerclage      POb/GynH:  OB History    Gravida Para Term Preterm AB TAB SAB Ectopic Multiple Living   SH:  Social History  Substance Use Topics  . Smoking status: Never Smoker   . Smokeless tobacco: Never Used  . Alcohol Use: No    FH:  Family History  Problem Relation Age of Onset  . Arthritis Mother   . Lupus Mother   . Autoimmune disease Mother   . Cancer Maternal Grandmother     breast  . Heart disease Maternal Grandfather   . Heart disease Paternal Grandfather   . Hypertension Paternal Grandfather      Allergies: No Known Allergies  Medications:  Current outpatient prescriptions:  . levothyroxine (SYNTHROID) 50 MCG tablet, Take 1 tablet (50 mcg total) by mouth daily before breakfast., Disp: 30 tablet, Rfl: 3 . Prenatal Vit-Fe Fumarate-FA (PRENATAL MULTIVITAMIN) TABS tablet, Take 1 tablet by mouth daily. , Disp: , Rfl:  . progesterone (PROMETRIUM) 200 MG capsule, Place 1 capsule nightly, Disp: 30 capsule, Rfl: 11  Review of Systems:   Review of Systems  Constitutional: Negative for fever, chills, weight loss, malaise/fatigue and diaphoresis.  HENT: Negative for hearing loss, ear pain, nosebleeds, congestion, sore throat, neck pain, tinnitus and ear discharge.  Eyes: Negative for blurred vision, double vision, photophobia, pain, discharge and redness.  Respiratory: Negative for cough, hemoptysis,  sputum production, shortness of breath, wheezing and stridor.  Cardiovascular: Negative for chest pain, palpitations, orthopnea, claudication, leg swelling and PND.  Gastrointestinal: Positive for abdominal pain. Negative for heartburn, nausea, vomiting, diarrhea, constipation, blood in stool and melena.  Genitourinary: Negative for dysuria, urgency, frequency, hematuria and flank pain.  Musculoskeletal: Negative for myalgias, back pain, joint pain and falls.  Skin: Negative for itching and rash.  Neurological: Negative for dizziness, tingling, tremors, sensory change, speech change, focal weakness, seizures, loss of consciousness, weakness and headaches.  Endo/Heme/Allergies: Negative for environmental allergies and polydipsia. Does not bruise/bleed easily.  Psychiatric/Behavioral: Negative for depression, suicidal ideas, hallucinations, memory loss and substance abuse. The patient is not nervous/anxious and does not have insomnia.     PHYSICAL EXAM:  Blood pressure 120/80, pulse 76, weight 169 lb (76.658 kg), last menstrual period 10/31/2015.   Vitals reviewed. Constitutional: She is oriented to person, place, and time. She appears well-developed and well-nourished.  HENT:  Head: Normocephalic and atraumatic.  Right Ear: External ear normal.  Left Ear: External ear normal.  Nose: Nose normal.  Mouth/Throat: Oropharynx is clear and moist.  Eyes: Conjunctivae and EOM are normal. Pupils are equal, round, and reactive to light. Right eye exhibits no discharge. Left eye exhibits no discharge. No scleral icterus.  Neck: Normal range of motion. Neck supple. No tracheal deviation present. No thyromegaly present.  Cardiovascular: Normal rate, regular rhythm, normal heart sounds and intact distal pulses. Exam reveals no gallop and no friction rub.  No murmur heard. Respiratory: Effort normal and breath sounds  normal. No respiratory distress. She has no wheezes. She has no rales. She  exhibits no tenderness.  GI: Soft. Bowel sounds are normal. She exhibits no distension and no mass. There is tenderness. There is no rebound and no guarding.  Genitourinary:   Vulva is normal without lesions Vagina is pink moist without discharge Cervix normal in appearance and pap is normal Uterus is 14 weeks size uterus Adnexa is negative with normal sized ovaries by sonogram  Musculoskeletal: Normal range of motion. She exhibits no edema and no tenderness.  Neurological: She is alert and oriented to person, place, and time. She has normal reflexes. She displays normal reflexes. No cranial nerve deficit. She exhibits normal muscle tone. Coordination normal.  Skin: Skin is warm and dry. No rash noted. No erythema. No pallor.  Psychiatric: She has a normal mood and affect. Her behavior is normal. Judgment and thought content normal.    Labs: Results for orders placed or performed in visit on 03/03/16 (from the past 336 hour(s))  POCT urinalysis dipstick   Collection Time: 03/03/16 1:51 PM  Result Value Ref Range   Color, UA     Clarity, UA     Glucose, UA neg    Bilirubin, UA     Ketones, UA neg    Spec Grav, UA     Blood, UA neg    pH, UA     Protein, UA neg    Urobilinogen, UA     Nitrite, UA neg    Leukocytes, UA Negative Negative  Results for orders placed or performed in visit on 02/24/16 (from the past 336 hour(s))  POCT urinalysis dipstick   Collection Time: 02/24/16 4:22 PM  Result Value Ref Range   Color, UA     Clarity, UA     Glucose, UA neg    Bilirubin, UA     Ketones, UA neg    Spec Grav, UA     Blood, UA neg    pH, UA     Protein, UA neg    Urobilinogen, UA     Nitrite, UA neg    Leukocytes, UA Negative Negative  Maternal Screen, Integrated #1   Collection Time: 02/24/16 4:28 PM  Result Value Ref Range   Results Report     Test Results: Comment    Submit Part 2 Sample Using Date:    Crown Rump Length 64.1 mm   CRL Scan Date:    Sonographer ID# [id number]    Gest. Age on Collection Date 12.7 weeks   Maternal [age] at EDD 22.2 years   Race Caucasian    Weight 166 lbs   Number of Fetuses 1    Nuchal Translucency (NT) 1.7 mm   PAPP-A Value 556.3 ng/mL   Comments: Comment    Note: Comment     EKG: No orders found for this or any previous visit.  Imaging Studies:  Imaging Results    US Ob Limited  02/24/2016 NUCHAL TRANSLUCENCY FOR INTEGRATED TESTING Devika Dragovich is in the office for nuchal translucency sonogram as part of an integrated screen. She is a 21 y.o. year old G3P0111 with Estimated Date of Delivery: 09/03/16 by early ultrasound now at [redacted]w[redacted]d weeks gestation. Thus far the pregnancy has been complicated by poor OB history (cervical insufficiency, PROM, PTD). GESTATION: SINGLETON FETAL ACTIVITY: Heart rate 156 bpm The fetus is active. CERVIX: Measures 4.0 cm, closed ADNEXA: The ovaries are normal. GESTATIONAL AGE AND BIOMETRICS: Gestational criteria: Estimated Date of Delivery: 09/03/16  by early ultrasound now at 5472w4d Previous Scans:1 CROWN RUMP LENGTH 64.1 mm 12+5 weeks NUCHAL TRANSLUCENCY 1.7 mm normal  AVERAGE EGA(BY THIS SCAN): 12+5 weeks The fetal nasal bone is identified. TECHNOLOGIST COMMENTS: US 12+4 wks,cx 4 cm,post pl gr 0,crl 64.241mm,normal ov's bilat,fhr 156 bpm,NB present,NT 1.27mm,measurements c/w dates The patient will have the first blood draw of her integrated screening today and the second draw in approximately 4 weeks. Oleh Geninarrie M. Stalter, BS, RDMS, RVT 02/24/2016 4:46 PM   [place] Fetal Nuchal Translucency Measurement  02/24/2016 NUCHAL TRANSLUCENCY FOR INTEGRATED  TESTING Shannan Harpershleigh Tanksley is in the office for nuchal translucency sonogram as part of an integrated screen. She is a 20 y.o. year old G3P0111 with Estimated Date of Delivery: 09/03/16 by early ultrasound now at 6972w4d weeks gestation. Thus far the pregnancy has been complicated by poor OB history (cervical insufficiency, PROM, PTD). GESTATION: SINGLETON FETAL ACTIVITY: Heart rate 156 bpm The fetus is active. CERVIX: Measures 4.0 cm, closed ADNEXA: The ovaries are normal. GESTATIONAL AGE AND BIOMETRICS: Gestational criteria: Estimated Date of Delivery: 09/03/16 by early ultrasound now at 4572w4d Previous Scans:1 CROWN RUMP LENGTH 64.1 mm 12+5 weeks NUCHAL TRANSLUCENCY 1.7 mm normal  AVERAGE EGA(BY THIS SCAN): 12+5 weeks The fetal nasal bone is identified. TECHNOLOGIST COMMENTS: US 12+4 wks,cx 4 cm,post pl gr 0,crl 64.361mm,normal ov's bilat,fhr 156 bpm,NB present,NT 1.317mm,measurements c/w dates The patient will have the first blood draw of her integrated screening today and the second draw in approximately 4 weeks. Oleh Geninarrie M. Stalter, BS, RDMS, RVT 02/24/2016 4:46 PM       Assessment: 6974w5d Estimated Date of Delivery: 09/03/16  Cervical incompetence Patient Active Problem List   Diagnosis Date Noted  . Subclinical hypothyroidism 02/04/2016  . History of suicidal ideation 02/03/2016  . Supervision of normal pregnancy 01/27/2016  . History of preterm delivery, currently pregnant 01/27/2016  . PCOS (polycystic ovarian syndrome) 01/27/2016  . Depression with anxiety 01/27/2016  . Family history of systemic lupus erythematosus (SLE) in mother 01/27/2016    Plan: Placement of McDonald Cerclage  EURE,LUTHER H 03/03/2016 2:25 PM

## 2016-03-09 NOTE — Interval H&P Note (Signed)
History and Physical Interval Note:  03/09/2016 11:05 AM  Tara Mcmahon  has presented today for surgery, with the diagnosis of incompetent cervix  The various methods of treatment have been discussed with the patient and family. After consideration of risks, benefits and other options for treatment, the patient has consented to  Procedure(s): CERCLAGE CERVICAL (N/A) as a surgical intervention .  The patient's history has been reviewed, patient examined, no change in status, stable for surgery.  I have reviewed the patient's chart and labs.  Questions were answered to the patient's satisfaction.     Orah Sonnen H

## 2016-03-09 NOTE — Anesthesia Procedure Notes (Addendum)
Spinal Patient location during procedure: OR Start time: 03/09/2016 11:30 AM Staffing Resident/CRNA: Glynn OctaveANIEL, Cain Fitzhenry E Preanesthetic Checklist Completed: patient identified, site marked, surgical consent, pre-op evaluation, timeout performed, IV checked, risks and benefits discussed and monitors and equipment checked Spinal Block Patient position: sitting Prep: DuraPrep Patient monitoring: heart rate, cardiac monitor, continuous pulse ox and blood pressure Approach: midline Location: L3-4 Injection technique: single-shot Needle Needle type: Sprotte  Needle gauge: 24 G Needle length: 9 cm Assessment Sensory level: T4 Additional Notes Tray #[id number] Expires12/2017 CRNA attempted x1, Dr. Jayme CloudGonzalez successful with first attempt.

## 2016-03-10 ENCOUNTER — Encounter (HOSPITAL_COMMUNITY): Payer: Self-pay | Admitting: Obstetrics & Gynecology

## 2016-03-10 ENCOUNTER — Encounter: Payer: Self-pay | Admitting: Obstetrics & Gynecology

## 2016-03-14 ENCOUNTER — Telehealth: Payer: Self-pay | Admitting: Women's Health

## 2016-03-14 NOTE — Telephone Encounter (Signed)
Pt informed per Dr.Eure, normal to have the watery discharge due to cerclage (foreign body) inserted 03/09/2016. Discharge should improve with time, pt to keep her appt for 03/16/2016 at 2:30 pm.

## 2016-03-14 NOTE — Telephone Encounter (Signed)
Per our conversation.

## 2016-03-14 NOTE — Telephone Encounter (Signed)
Pt states had Cerclage procedure last Wednesday, vaginal bleeding resolved but c/o watery discharge. Pt states "not  enough watery discharge to wear a pany liner but is enough to cause discomfort." Pt states is taking the Prometrium and did not have as much discharge with this med before Cerclage. Please advise.

## 2016-03-16 ENCOUNTER — Ambulatory Visit (INDEPENDENT_AMBULATORY_CARE_PROVIDER_SITE_OTHER): Payer: Medicaid Other | Admitting: Obstetrics & Gynecology

## 2016-03-16 ENCOUNTER — Encounter: Payer: Self-pay | Admitting: Obstetrics & Gynecology

## 2016-03-16 VITALS — BP 110/60 | HR 80 | Wt 168.0 lb

## 2016-03-16 DIAGNOSIS — O09292 Supervision of pregnancy with other poor reproductive or obstetric history, second trimester: Secondary | ICD-10-CM

## 2016-03-16 DIAGNOSIS — Z1389 Encounter for screening for other disorder: Secondary | ICD-10-CM

## 2016-03-16 DIAGNOSIS — Z331 Pregnant state, incidental: Secondary | ICD-10-CM

## 2016-03-16 DIAGNOSIS — O0992 Supervision of high risk pregnancy, unspecified, second trimester: Secondary | ICD-10-CM

## 2016-03-16 LAB — POCT URINALYSIS DIPSTICK
GLUCOSE UA: NEGATIVE
Ketones, UA: NEGATIVE
Leukocytes, UA: NEGATIVE
Nitrite, UA: NEGATIVE
PROTEIN UA: NEGATIVE
RBC UA: NEGATIVE

## 2016-03-16 NOTE — Progress Notes (Signed)
Fetal Surveillance Testing today:  FHR   High Risk Pregnancy Diagnosis(es):   Incompetent cervix  [id number] 1854w4d Estimated Date of Delivery: 09/03/16  Blood pressure 110/60, pulse 80, weight 168 lb (76.204 kg), last menstrual period 10/31/2015.  Urinalysis: Negative   HPI: The patient is being seen today for ongoing management of incompetent cervix. Today she reports vaginal discharge   BP weight and urine results all reviewed and noted. Patient reports good fetal movement, denies any bleeding and no rupture of membranes symptoms or regular contractions.  Fundal Height:  na Fetal Heart rate:  165 Edema:  none  Patient is without complaints other than noted in her HPI. All questions were answered.  All lab and sonogram results have been reviewed. Comments:    Assessment:  1.  Pregnancy at 7954w4d,  Estimated Date of Delivery: 09/03/16 :                          2.  Cervical incompetency, s/p elective cerclage placement                        3.    Medication(s) Plans:  prometrium qhs, begin 17P next week  Treatment Plan:  cervical length baseline next week then will recheck in 2 week intervals  No Follow-up on file. for appointment for high risk OB care  No orders of the defined types were placed in this encounter.   Orders Placed This Encounter  Procedures  . POCT urinalysis dipstick

## 2016-03-21 ENCOUNTER — Encounter (HOSPITAL_COMMUNITY): Payer: Self-pay | Admitting: *Deleted

## 2016-03-21 ENCOUNTER — Telehealth: Payer: Self-pay | Admitting: *Deleted

## 2016-03-21 ENCOUNTER — Other Ambulatory Visit: Payer: Self-pay | Admitting: Obstetrics & Gynecology

## 2016-03-21 ENCOUNTER — Inpatient Hospital Stay (HOSPITAL_COMMUNITY)
Admission: AD | Admit: 2016-03-21 | Discharge: 2016-03-21 | Disposition: A | Discharge status: Home / Self Care | Payer: Medicaid Other | Source: Ambulatory Visit | Attending: Obstetrics & Gynecology | Admitting: Obstetrics & Gynecology

## 2016-03-21 DIAGNOSIS — O99612 Diseases of the digestive system complicating pregnancy, second trimester: Secondary | ICD-10-CM | POA: Diagnosis not present

## 2016-03-21 DIAGNOSIS — K6289 Other specified diseases of anus and rectum: Secondary | ICD-10-CM | POA: Diagnosis present

## 2016-03-21 DIAGNOSIS — O99282 Endocrine, nutritional and metabolic diseases complicating pregnancy, second trimester: Secondary | ICD-10-CM | POA: Insufficient documentation

## 2016-03-21 DIAGNOSIS — Z3A16 16 weeks gestation of pregnancy: Secondary | ICD-10-CM | POA: Diagnosis not present

## 2016-03-21 DIAGNOSIS — O3432 Maternal care for cervical incompetence, second trimester: Secondary | ICD-10-CM | POA: Insufficient documentation

## 2016-03-21 DIAGNOSIS — K5902 Outlet dysfunction constipation: Secondary | ICD-10-CM | POA: Diagnosis not present

## 2016-03-21 DIAGNOSIS — E282 Polycystic ovarian syndrome: Secondary | ICD-10-CM | POA: Diagnosis not present

## 2016-03-21 DIAGNOSIS — E039 Hypothyroidism, unspecified: Secondary | ICD-10-CM | POA: Insufficient documentation

## 2016-03-21 DIAGNOSIS — O26892 Other specified pregnancy related conditions, second trimester: Secondary | ICD-10-CM | POA: Diagnosis not present

## 2016-03-21 DIAGNOSIS — O99342 Other mental disorders complicating pregnancy, second trimester: Secondary | ICD-10-CM | POA: Diagnosis not present

## 2016-03-21 DIAGNOSIS — F418 Other specified anxiety disorders: Secondary | ICD-10-CM | POA: Insufficient documentation

## 2016-03-21 LAB — WET PREP, GENITAL
CLUE CELLS WET PREP: NONE SEEN
Sperm: NONE SEEN
Trich, Wet Prep: NONE SEEN
YEAST WET PREP: NONE SEEN

## 2016-03-21 MED ORDER — DOCUSATE SODIUM 250 MG PO CAPS: 250.0000 mg | ORAL_CAPSULE | Freq: Two times a day (BID) | ORAL | Status: DC

## 2016-03-21 NOTE — Discharge Instructions (Signed)

## 2016-03-21 NOTE — Telephone Encounter (Signed)
Pt c/o rectal pressure starting this am, no vaginal bleeding, no gush of fluids. Pt states she was on her feet more this past weekend. Per Joellyn HaffKim Booker, CNM, pt can be given an appt for tomorrow [facility] Pt states she felt it would be ok to wait and be seen at our office tomorrow. Pt advised if gush of fluids, vaginal bleeding, or rectal pressure increase between now and her scheduled appt tomorrow [facility]. Pt verbalized understanding.

## 2016-03-21 NOTE — MAU Note (Signed)
Feeling pressure and pain  in pelvis/rectum- started this morning..  Had a cerclage placed on 04/12. No bleeding or leaking.

## 2016-03-21 NOTE — MAU Provider Note (Signed)
History   [id number] @ 16.2 wks in with c/o rectal pressure. Pt has circlauge placed on 4/12 for incompetent cervix.   CSN: [id number]  Arrival date & time 03/21/16  1814   First Provider Initiated Contact with Patient 03/21/16 1914      Chief Complaint  Patient presents with  . Rectal Pain    HPI  Past Medical History  Diagnosis Date  . PCOS (polycystic ovarian syndrome)   . Depression   . Anxiety   . Cervical incompetence during pregnancy   . Hypothyroidism     Past Surgical History  Procedure Laterality Date  . Cervical cerclage    . Labial reduction    . Cervical cerclage N/A 03/09/2016    [facility];  Surgeon: Lazaro ArmsLuther H Eure, MD;  Location: [facility];  Service: Gynecology;  Laterality: N/A;    Family History  Problem Relation Age of Onset  . Arthritis Mother   . Lupus Mother   . Autoimmune disease Mother   . Cancer Maternal Grandmother     breast  . Heart disease Maternal Grandfather   . Heart disease Paternal Grandfather   . Hypertension Paternal Grandfather     Social History  Substance Use Topics  . Smoking status: Never Smoker   . Smokeless tobacco: Never Used  . Alcohol Use: No    OB History    Gravida Para Term Preterm AB TAB SAB Ectopic Multiple Living   3 1  1 1  1   1       Review of Systems  Constitutional: Negative.   HENT: Negative.   Eyes: Negative.   Respiratory: Negative.   Cardiovascular: Negative.   Gastrointestinal:       Rectal pressure  Endocrine: Negative.   Genitourinary: Negative.   Musculoskeletal: Negative.   Skin: Negative.   Allergic/Immunologic: Negative.   Neurological: Negative.   Hematological: Negative.     Allergies  Review of patient's allergies indicates no known allergies.  Home Medications  No current outpatient prescriptions on file.  BP 126/67 mmHg  Pulse 85  Temp(Src) 98.2 F (36.8 C) (Oral)  Resp 20  LMP 10/31/2015  Physical Exam  Constitutional: She is oriented to  person, place, and time. She appears well-developed and well-nourished.  HENT:  Head: Normocephalic.  Eyes: Pupils are equal, round, and reactive to light.  Neck: Normal range of motion.  Cardiovascular: Normal rate, regular rhythm, normal heart sounds and intact distal pulses.   Pulmonary/Chest: Effort normal and breath sounds normal.  Abdominal: Soft. Bowel sounds are normal.  Genitourinary: Vagina normal and uterus normal.  Musculoskeletal: Normal range of motion.  Neurological: She is alert and oriented to person, place, and time. She has normal reflexes.  Skin: Skin is warm and dry.  Psychiatric: She has a normal mood and affect. Her behavior is normal. Judgment and thought content normal.    MAU Course  Procedures (including critical care time)  Labs Reviewed  WET PREP, GENITAL  GC/CHLAMYDIA PROBE AMP () NOT [facility]   No results found.   No diagnosis found.    MDM  Wet prep, chla and GC obtained. SVE no tension on stitch, cervix still long and stitch intact. Rectum noted to be full of stool with vag exam. Discussed importance of not getting constipated. Will start stool softners. D/c home

## 2016-03-22 ENCOUNTER — Encounter: Payer: Self-pay | Admitting: Obstetrics & Gynecology

## 2016-03-22 LAB — GC/CHLAMYDIA PROBE AMP (~~LOC~~) NOT AT ARMC
CHLAMYDIA, DNA PROBE: NEGATIVE
NEISSERIA GONORRHEA: NEGATIVE

## 2016-03-23 ENCOUNTER — Encounter: Payer: Self-pay | Admitting: Obstetrics & Gynecology

## 2016-03-23 ENCOUNTER — Ambulatory Visit (INDEPENDENT_AMBULATORY_CARE_PROVIDER_SITE_OTHER): Payer: Medicaid Other

## 2016-03-23 ENCOUNTER — Ambulatory Visit (INDEPENDENT_AMBULATORY_CARE_PROVIDER_SITE_OTHER): Payer: Medicaid Other | Admitting: Obstetrics & Gynecology

## 2016-03-23 VITALS — BP 120/60 | HR 98 | Wt 167.0 lb

## 2016-03-23 DIAGNOSIS — Z3A17 17 weeks gestation of pregnancy: Secondary | ICD-10-CM

## 2016-03-23 DIAGNOSIS — O3432 Maternal care for cervical incompetence, second trimester: Secondary | ICD-10-CM | POA: Diagnosis not present

## 2016-03-23 DIAGNOSIS — Z1389 Encounter for screening for other disorder: Secondary | ICD-10-CM | POA: Diagnosis not present

## 2016-03-23 DIAGNOSIS — O09292 Supervision of pregnancy with other poor reproductive or obstetric history, second trimester: Secondary | ICD-10-CM | POA: Diagnosis not present

## 2016-03-23 DIAGNOSIS — Z3491 Encounter for supervision of normal pregnancy, unspecified, first trimester: Secondary | ICD-10-CM

## 2016-03-23 DIAGNOSIS — Z331 Pregnant state, incidental: Secondary | ICD-10-CM | POA: Diagnosis not present

## 2016-03-23 DIAGNOSIS — O09212 Supervision of pregnancy with history of pre-term labor, second trimester: Secondary | ICD-10-CM | POA: Diagnosis not present

## 2016-03-23 DIAGNOSIS — O0992 Supervision of high risk pregnancy, unspecified, second trimester: Secondary | ICD-10-CM

## 2016-03-23 DIAGNOSIS — Z369 Encounter for antenatal screening, unspecified: Secondary | ICD-10-CM

## 2016-03-23 LAB — POCT URINALYSIS DIPSTICK
Blood, UA: NEGATIVE
GLUCOSE UA: NEGATIVE
KETONES UA: NEGATIVE
LEUKOCYTES UA: NEGATIVE
Nitrite, UA: NEGATIVE
Protein, UA: NEGATIVE

## 2016-03-23 MED ORDER — HYDROXYPROGESTERONE CAPROATE 250 MG/ML IM OIL
250.0000 mg | TOPICAL_OIL | Freq: Once | INTRAMUSCULAR | Status: AC
  Administered 2016-03-23: 250 mg via INTRAMUSCULAR

## 2016-03-23 NOTE — Addendum Note (Signed)
Addended by: Federico FlakeNES, PEGGY A on: 03/23/2016 04:02 PM   Modules accepted: Orders

## 2016-03-23 NOTE — Addendum Note (Signed)
Addended by: Federico FlakeNES, PEGGY A on: 03/23/2016 04:17 PM   Modules accepted: Orders

## 2016-03-23 NOTE — Addendum Note (Signed)
Addended by: Federico FlakeNES, PEGGY A on: 03/23/2016 04:24 PM   Modules accepted: Orders

## 2016-03-23 NOTE — Progress Notes (Signed)
[id number] 5663w4d Estimated Date of Delivery: 09/03/16  Blood pressure 120/60, pulse 98, weight 167 lb (75.751 kg), last menstrual period 10/31/2015.   BP weight and urine results all reviewed and noted.  Please refer to the obstetrical flow sheet for the fundal height and fetal heart rate documentation:  Patient reports good fetal movement, denies any bleeding and no rupture of membranes symptoms or regular contractions. Patient is without complaints. All questions were answered.  Orders Placed This Encounter  Procedures  . POCT urinalysis dipstick    Plan:  Continued routine obstetrical care,   Cerclage in place 17P given 2nd IT today 20 week scn in 3 weeks  No Follow-up on file.

## 2016-03-23 NOTE — Progress Notes (Signed)
US TV 16+4 wks,post pl gr 0, cx 4.4 cm to cerclage w and w/o pressure,svp of fluid 4.2 cm,normal ov's bilat,fhr 149 bpm,

## 2016-03-25 LAB — MATERNAL SCREEN, INTEGRATED #2
ADSF: 1.38
AFP MoM: 0.98
Alpha-Fetoprotein: 31.2 ng/mL
CROWN RUMP LENGTH: 64.1 mm
DIA MOM: 1.6
DIA VALUE: 260.6 pg/mL
ESTRIOL UNCONJUGATED: 1.27 ng/mL
Gest. Age on Collection Date: 12.7 weeks
Gestational Age: 16.7 weeks
Maternal Age at EDD: 22.2 years
NUCHAL TRANSLUCENCY (NT): 1.7 mm
NUCHAL TRANSLUCENCY MOM: 1.06
NUMBER OF FETUSES: 1
PAPP-A MoM: 0.61
PAPP-A VALUE: 556.3 ng/mL
TEST RESULTS: NEGATIVE
WEIGHT: 166 [lb_av]
Weight: 166 [lb_av]
hCG MoM: 2.12
hCG Value: 61.2 IU/mL

## 2016-03-30 ENCOUNTER — Encounter: Payer: Self-pay | Admitting: Obstetrics and Gynecology

## 2016-03-30 ENCOUNTER — Encounter: Payer: Self-pay | Admitting: *Deleted

## 2016-03-30 ENCOUNTER — Ambulatory Visit (INDEPENDENT_AMBULATORY_CARE_PROVIDER_SITE_OTHER): Payer: Medicaid Other | Admitting: Obstetrics and Gynecology

## 2016-03-30 ENCOUNTER — Ambulatory Visit (INDEPENDENT_AMBULATORY_CARE_PROVIDER_SITE_OTHER): Payer: Medicaid Other | Admitting: *Deleted

## 2016-03-30 VITALS — BP 112/60 | HR 98 | Wt 170.0 lb

## 2016-03-30 VITALS — BP 112/60 | HR 98 | Ht 59.0 in | Wt 170.0 lb

## 2016-03-30 DIAGNOSIS — O0992 Supervision of high risk pregnancy, unspecified, second trimester: Secondary | ICD-10-CM

## 2016-03-30 DIAGNOSIS — Z3491 Encounter for supervision of normal pregnancy, unspecified, first trimester: Secondary | ICD-10-CM

## 2016-03-30 DIAGNOSIS — O09212 Supervision of pregnancy with history of pre-term labor, second trimester: Secondary | ICD-10-CM | POA: Diagnosis not present

## 2016-03-30 DIAGNOSIS — O2340 Unspecified infection of urinary tract in pregnancy, unspecified trimester: Secondary | ICD-10-CM | POA: Insufficient documentation

## 2016-03-30 DIAGNOSIS — O3432 Maternal care for cervical incompetence, second trimester: Secondary | ICD-10-CM

## 2016-03-30 DIAGNOSIS — N898 Other specified noninflammatory disorders of vagina: Secondary | ICD-10-CM

## 2016-03-30 DIAGNOSIS — Z331 Pregnant state, incidental: Secondary | ICD-10-CM | POA: Diagnosis not present

## 2016-03-30 DIAGNOSIS — O2342 Unspecified infection of urinary tract in pregnancy, second trimester: Secondary | ICD-10-CM

## 2016-03-30 DIAGNOSIS — Z1389 Encounter for screening for other disorder: Secondary | ICD-10-CM

## 2016-03-30 DIAGNOSIS — O09892 Supervision of other high risk pregnancies, second trimester: Secondary | ICD-10-CM

## 2016-03-30 LAB — POCT WET PREP WITH KOH
KOH PREP POC: NEGATIVE
TRICHOMONAS UA: NEGATIVE

## 2016-03-30 LAB — POCT URINALYSIS DIPSTICK
Glucose, UA: NEGATIVE
KETONES UA: NEGATIVE
Leukocytes, UA: NEGATIVE
Nitrite, UA: NEGATIVE

## 2016-03-30 MED ORDER — NITROFURANTOIN MONOHYD MACRO 100 MG PO CAPS: 100.0000 mg | ORAL_CAPSULE | Freq: Two times a day (BID) | ORAL | Status: DC

## 2016-03-30 MED ORDER — HYDROXYPROGESTERONE CAPROATE 250 MG/ML IM OIL
250.0000 mg | TOPICAL_OIL | Freq: Once | INTRAMUSCULAR | Status: AC
  Administered 2016-03-30: 250 mg via INTRAMUSCULAR

## 2016-03-30 NOTE — Progress Notes (Signed)
Pt here for 17P. Pt tolerated shot well. Pt complains of burning with urination, back pain and cramping x 2 days. Pt has cerclage. Pt wanted to be seen. Pt to be worked in to see Dr. Emelda FearFerguson. JSY

## 2016-03-30 NOTE — Progress Notes (Addendum)
Patient ID: Tara Mcmahon, female   DOB: 10/02/1996, 20 y.o.   MRN: [id number]  [place]  [id number] 2514w4d Estimated Date of Delivery: 09/03/16  Blood pressure 112/60, pulse 98, weight 170 lb (77.111 kg), last menstrual period 10/31/2015.   refer to the ob flow sheet for FH and FHR, also BP, Wt, Urine results:notable for trace blood, trace protein  Patient reports  + good fetal movement, denies any bleeding and no rupture of membranes symptoms or regular contractions.  Patient complaints: Dysuria for 2 days with associated lower back pain. Denies fever, chills, nausea, emesis, vaginal itching. Pt reports h/o silent yeast with oldest child, delivered preterm.   Bedside transvaginal US shows nearly 5cm cervix with 3cm closed cervix above the cerclage. No funneling.  Cervix: long, closed, firm tissue texture   Questions were answered. Assessment: LROB [id number] @ 3714w4d  Cerclage in place, NO cervical changes 17P given  Wet prep/KOH obtained; negative aside from moderate amount of WBCs   Plan:   Continued routine obstetrical care Will rx 100mg  bid Macrobid   And send culture.  F/u in 2 weeks for 20 week scan, as scheduled     By signing my name below, I, Doreatha MartinEva Mathews, attest that this documentation has been prepared under the direction and in the presence of Tilda BurrowJohn Marvene Strohm V, MD. Electronically Signed: Doreatha MartinEva Mathews, ED Scribe. 03/30/2016. 10:42 AM.  I personally performed the services described in this documentation, which was SCRIBED in my presence. The recorded information has been reviewed and considered accurate. It has been edited as necessary during review. Tilda BurrowFERGUSON,Nekhi Liwanag V, MD

## 2016-03-30 NOTE — Progress Notes (Signed)
TRANSVAGINAL ULTRASOUND BY JVF: Cervical length by u/s shows cervix 5.0 cm length , curved with over 2.6 cm of completely closed cervix above the McDonald Cerclage which remains securely in place. Breech presentation, normal fluid volume

## 2016-03-30 NOTE — Addendum Note (Signed)
Addended by: Richardson ChiquitoRAVIS, Jacen Carlini M on: 03/30/2016 11:13 AM   Modules accepted: Orders

## 2016-03-31 LAB — URINE CULTURE

## 2016-04-02 ENCOUNTER — Encounter (HOSPITAL_COMMUNITY): Payer: Self-pay | Admitting: *Deleted

## 2016-04-02 ENCOUNTER — Inpatient Hospital Stay (HOSPITAL_COMMUNITY)
Admission: AD | Admit: 2016-04-02 | Discharge: 2016-04-02 | Disposition: A | Discharge status: Home / Self Care | Payer: Medicaid Other | Source: Ambulatory Visit | Attending: Obstetrics & Gynecology | Admitting: Obstetrics & Gynecology

## 2016-04-02 DIAGNOSIS — F419 Anxiety disorder, unspecified: Secondary | ICD-10-CM | POA: Insufficient documentation

## 2016-04-02 DIAGNOSIS — O99282 Endocrine, nutritional and metabolic diseases complicating pregnancy, second trimester: Secondary | ICD-10-CM | POA: Insufficient documentation

## 2016-04-02 DIAGNOSIS — F329 Major depressive disorder, single episode, unspecified: Secondary | ICD-10-CM | POA: Diagnosis not present

## 2016-04-02 DIAGNOSIS — O99342 Other mental disorders complicating pregnancy, second trimester: Secondary | ICD-10-CM | POA: Insufficient documentation

## 2016-04-02 DIAGNOSIS — Z803 Family history of malignant neoplasm of breast: Secondary | ICD-10-CM | POA: Diagnosis not present

## 2016-04-02 DIAGNOSIS — Z3A18 18 weeks gestation of pregnancy: Secondary | ICD-10-CM | POA: Diagnosis not present

## 2016-04-02 DIAGNOSIS — Z8489 Family history of other specified conditions: Secondary | ICD-10-CM | POA: Insufficient documentation

## 2016-04-02 DIAGNOSIS — Z8249 Family history of ischemic heart disease and other diseases of the circulatory system: Secondary | ICD-10-CM | POA: Diagnosis not present

## 2016-04-02 DIAGNOSIS — E039 Hypothyroidism, unspecified: Secondary | ICD-10-CM | POA: Diagnosis not present

## 2016-04-02 DIAGNOSIS — O3432 Maternal care for cervical incompetence, second trimester: Secondary | ICD-10-CM

## 2016-04-02 DIAGNOSIS — Z8261 Family history of arthritis: Secondary | ICD-10-CM | POA: Diagnosis not present

## 2016-04-02 DIAGNOSIS — R109 Unspecified abdominal pain: Secondary | ICD-10-CM | POA: Diagnosis present

## 2016-04-02 LAB — AMNISURE RUPTURE OF MEMBRANE (ROM) NOT AT ARMC: Amnisure ROM: NEGATIVE

## 2016-04-02 NOTE — MAU Note (Signed)
Pt states she was having some back pain and cramping around 1400.  Pt states she has a cerclage placed on April 12th.  Pt states she started leaking around 1500.  Pt states she has not seen any leaking since so she is unsure if it may have been urine.

## 2016-04-02 NOTE — MAU Provider Note (Signed)
History     CSN: [id number]  Arrival date and time: 04/02/16 1555   First Provider Initiated Contact with Patient 04/02/16 1642      Chief Complaint  Patient presents with  . Abdominal Cramping  . Rupture of Membranes   HPI Tara Mcmahon 20 y.o. 7660w0d  Client has a cerclage and uses progesterone suppositories.  Thought today she might have leaking and is very worried that her water has broken.  Is tearful.  Has not had a big gush of fluid and has considered she is leaking urine but has not done that before.  Denies any fever or lower abdominal pain.  Reports she was seen on Wednesday in the office and the cerclage was stable and her cervix was 5 cm in length.    OB History    Gravida Para Term Preterm AB TAB SAB Ectopic Multiple Living   3 1  1 1  1   1       Past Medical History  Diagnosis Date  . PCOS (polycystic ovarian syndrome)   . Depression   . Anxiety   . Cervical incompetence during pregnancy   . Hypothyroidism     Past Surgical History  Procedure Laterality Date  . Cervical cerclage    . Labial reduction    . Cervical cerclage N/A 03/09/2016    [facility];  Surgeon: Lazaro ArmsLuther H Eure, MD;  Location: [facility];  Service: Gynecology;  Laterality: N/A;    Family History  Problem Relation Age of Onset  . Arthritis Mother   . Lupus Mother   . Autoimmune disease Mother   . Cancer Maternal Grandmother     breast  . Heart disease Maternal Grandfather   . Heart disease Paternal Grandfather   . Hypertension Paternal Grandfather     Social History  Substance Use Topics  . Smoking status: Never Smoker   . Smokeless tobacco: Never Used  . Alcohol Use: No    Allergies: No Known Allergies  Prescriptions prior to admission  Medication Sig Dispense Refill Last Dose  . docusate sodium (COLACE) 250 MG capsule Take 250 mg by mouth 2 (two) times daily as needed for constipation.   Past Week at Unknown time  . levothyroxine (SYNTHROID) 50 MCG  tablet Take 1 tablet (50 mcg total) by mouth daily before breakfast. 30 tablet 3 04/01/2016 at Unknown time  . nitrofurantoin, macrocrystal-monohydrate, (MACROBID) 100 MG capsule Take 1 capsule (100 mg total) by mouth 2 (two) times daily. For uti 14 capsule 0 04/02/2016 at Unknown time  . Prenatal Vit-Fe Fumarate-FA (PRENATAL MULTIVITAMIN) TABS tablet Take 1 tablet by mouth at bedtime.    04/01/2016 at Unknown time  . progesterone (PROMETRIUM) 200 MG capsule Place 200 mg vaginally at bedtime.   04/01/2016 at Unknown time    Review of Systems  Constitutional: Negative for fever.  Gastrointestinal: Negative for nausea, vomiting and abdominal pain.  Genitourinary:       No vaginal discharge. No vaginal bleeding. No dysuria. Leaking of fluid?   Physical Exam   Blood pressure 131/88, pulse 105, temperature 98.1 F (36.7 C), temperature source Oral, resp. rate 18, height 4\' 11"  (1.499 m), weight 169 lb 3.2 oz (76.749 kg), last menstrual period 10/31/2015.  Physical Exam  Nursing note and vitals reviewed. Constitutional: She is oriented to person, place, and time. She appears well-developed and well-nourished.  HENT:  Head: Normocephalic.  Eyes: EOM are normal.  Neck: Neck supple.  GI: Soft. There is no tenderness.  Genitourinary:  Speculum exam: Perineum - dry, no fluid seen leaking Vagina - Small amount of creamy discharge, no odor, no pooling of fluid Cervix - No contact bleeding, no active leaking, cerclage appears intact, cervix appears closed Amnisure done Chaperone present for exam.  Musculoskeletal: Normal range of motion.  Neurological: She is alert and oriented to person, place, and time.  Skin: Skin is warm and dry.  Psychiatric: She has a normal mood and affect.    MAU Course  Procedures Results for orders placed or performed during the hospital encounter of 04/02/16 (from the past 24 hour(s))  Amnisure rupture of membrane (rom)not at [facility]     Status: None   Collection Time:  04/02/16  4:45 PM  Result Value Ref Range   Amnisure ROM NEGATIVE     MDM   Assessment and Plan  Cervical cerclage in place, second trimester  Plan Keep the appointments you have in the office Return if you develop fever or further leaking.   BURLESON,TERRI 04/02/2016, 5:18 PM

## 2016-04-02 NOTE — Discharge Instructions (Signed)
Keep your appointments as scheduled. Return if you develop fever, body aches, or have continued leaking of fluid. Keep all of your appointments as scheduled.

## 2016-04-06 ENCOUNTER — Ambulatory Visit (INDEPENDENT_AMBULATORY_CARE_PROVIDER_SITE_OTHER): Payer: Medicaid Other | Admitting: *Deleted

## 2016-04-06 ENCOUNTER — Ambulatory Visit: Payer: Medicaid Other

## 2016-04-06 ENCOUNTER — Encounter: Payer: Self-pay | Admitting: *Deleted

## 2016-04-06 VITALS — BP 130/70 | HR 102 | Ht 59.0 in | Wt 169.5 lb

## 2016-04-06 DIAGNOSIS — O09212 Supervision of pregnancy with history of pre-term labor, second trimester: Secondary | ICD-10-CM | POA: Diagnosis not present

## 2016-04-06 DIAGNOSIS — Z1389 Encounter for screening for other disorder: Secondary | ICD-10-CM | POA: Diagnosis not present

## 2016-04-06 DIAGNOSIS — Z331 Pregnant state, incidental: Secondary | ICD-10-CM | POA: Diagnosis not present

## 2016-04-06 DIAGNOSIS — Z3A19 19 weeks gestation of pregnancy: Secondary | ICD-10-CM

## 2016-04-06 DIAGNOSIS — Z3491 Encounter for supervision of normal pregnancy, unspecified, first trimester: Secondary | ICD-10-CM

## 2016-04-06 DIAGNOSIS — O09892 Supervision of other high risk pregnancies, second trimester: Secondary | ICD-10-CM

## 2016-04-06 LAB — POCT URINALYSIS DIPSTICK
Blood, UA: NEGATIVE
Glucose, UA: NEGATIVE
KETONES UA: NEGATIVE
Leukocytes, UA: NEGATIVE
Nitrite, UA: NEGATIVE
PROTEIN UA: NEGATIVE

## 2016-04-06 MED ORDER — HYDROXYPROGESTERONE CAPROATE 250 MG/ML IM OIL
250.0000 mg | TOPICAL_OIL | Freq: Once | INTRAMUSCULAR | Status: AC
  Administered 2016-04-06: 250 mg via INTRAMUSCULAR

## 2016-04-06 NOTE — Progress Notes (Signed)
Pt here for 17P. Pt tolerated shot well. Return in 1 week for next shot. JSY 

## 2016-04-07 ENCOUNTER — Ambulatory Visit: Payer: Medicaid Other

## 2016-04-09 ENCOUNTER — Encounter (HOSPITAL_COMMUNITY): Payer: Self-pay | Admitting: *Deleted

## 2016-04-09 ENCOUNTER — Inpatient Hospital Stay (HOSPITAL_COMMUNITY)
Admission: AD | Admit: 2016-04-09 | Discharge: 2016-04-09 | Disposition: A | Discharge status: Home / Self Care | Payer: Medicaid Other | Source: Ambulatory Visit | Attending: Obstetrics & Gynecology | Admitting: Obstetrics & Gynecology

## 2016-04-09 DIAGNOSIS — W109XXA Fall (on) (from) unspecified stairs and steps, initial encounter: Secondary | ICD-10-CM | POA: Insufficient documentation

## 2016-04-09 DIAGNOSIS — O9A219 Injury, poisoning and certain other consequences of external causes complicating pregnancy, unspecified trimester: Secondary | ICD-10-CM

## 2016-04-09 DIAGNOSIS — R109 Unspecified abdominal pain: Secondary | ICD-10-CM | POA: Insufficient documentation

## 2016-04-09 DIAGNOSIS — Z3A19 19 weeks gestation of pregnancy: Secondary | ICD-10-CM | POA: Insufficient documentation

## 2016-04-09 DIAGNOSIS — Z8249 Family history of ischemic heart disease and other diseases of the circulatory system: Secondary | ICD-10-CM | POA: Insufficient documentation

## 2016-04-09 DIAGNOSIS — Z3491 Encounter for supervision of normal pregnancy, unspecified, first trimester: Secondary | ICD-10-CM

## 2016-04-09 DIAGNOSIS — O9A212 Injury, poisoning and certain other consequences of external causes complicating pregnancy, second trimester: Secondary | ICD-10-CM

## 2016-04-09 DIAGNOSIS — W108XXA Fall (on) (from) other stairs and steps, initial encounter: Secondary | ICD-10-CM

## 2016-04-09 DIAGNOSIS — Z8261 Family history of arthritis: Secondary | ICD-10-CM | POA: Diagnosis not present

## 2016-04-09 DIAGNOSIS — S3992XA Unspecified injury of lower back, initial encounter: Secondary | ICD-10-CM | POA: Diagnosis not present

## 2016-04-09 DIAGNOSIS — Z803 Family history of malignant neoplasm of breast: Secondary | ICD-10-CM | POA: Diagnosis not present

## 2016-04-09 DIAGNOSIS — E039 Hypothyroidism, unspecified: Secondary | ICD-10-CM | POA: Diagnosis not present

## 2016-04-09 DIAGNOSIS — E282 Polycystic ovarian syndrome: Secondary | ICD-10-CM | POA: Insufficient documentation

## 2016-04-09 LAB — URINE MICROSCOPIC-ADD ON

## 2016-04-09 LAB — URINALYSIS, ROUTINE W REFLEX MICROSCOPIC
BILIRUBIN URINE: NEGATIVE
Glucose, UA: NEGATIVE mg/dL
Hgb urine dipstick: NEGATIVE
KETONES UR: NEGATIVE mg/dL
Nitrite: NEGATIVE
Protein, ur: NEGATIVE mg/dL
Specific Gravity, Urine: 1.015 (ref 1.005–1.030)
pH: 7 (ref 5.0–8.0)

## 2016-04-09 NOTE — Discharge Instructions (Signed)
What Do I Need to Know About Injuries During Pregnancy? °Injuries can happen during pregnancy. Minor falls and accidents usually do not harm you or your baby. However, any injury should be reported to your doctor. °WHAT CAN I DO TO PROTECT MYSELF FROM INJURIES? °· Remove rugs and loose objects on the floor. °· Wear comfortable shoes that have a good grip. Do not wear high-heeled shoes. °· Always wear your seat belt. The lap belt should be below your belly. Always practice safe driving. °· Do not ride on a motorcycle. °· Do not participate in high-impact activities or sports. °· Avoid: °¨ Walking on wet or slippery floors. °¨ Fires. °¨ Starting fires. °¨ Lifting heavy pots of boiling or hot liquids. °¨ Fixing electrical problems. °· Only take medicine as told by your doctor. °· Know your blood type and the blood type of the baby's father. °· Call your local emergency services (911 in the [place] if you are a victim of domestic violence or assault. For help and support, contact the National Domestic Violence Hotline. °WHEN SHOULD I GET HELP RIGHT AWAY? °· You fall on your belly or have any high-impact accident or injury. °· You have been a victim of domestic violence or any kind of violence. °· You have been in a car accident. °· You have bleeding from your vagina. °· Fluid is leaking from your vagina. °· You start to have belly cramping (contractions) or pain. °· You feel weak or pass out (faint). °· You start to throw up (vomit) after an injury. °· You have been burned. °· You have a stiff neck or neck pain. °· You get a headache or have vision problems after an injury. °· You do not feel the baby move or the baby is not moving as much as normal. °  °This information is not intended to replace advice given to you by your health care provider. Make sure you discuss any questions you have with your health care provider. °  °Document Released: 12/17/2010 Document Revised: 12/05/2014 Document Reviewed:  08/21/2013 °Elsevier Interactive Patient Education ©2016 [employer] ° °

## 2016-04-09 NOTE — MAU Provider Note (Signed)
MAU HISTORY AND PHYSICAL  Chief Complaint:  fall  Tara Mcmahon is a 20 y.o.  [phone] with IUP at [redacted]w[redacted]d presenting for fall.  Happened noon today. Fell down carpeted stairs. Fell down 6 stairs. Hit tailbone and then slid down. No head trauma or loc, no belly trauma. Pain low back. Initially had abdominal cramping, that is resolved. No vaginal bleeding, no lof. No weakness or numbness, no loss of bowel or bladder.    Past Medical History  Diagnosis Date  . PCOS (polycystic ovarian syndrome)   . Depression   . Anxiety   . Cervical incompetence during pregnancy   . Hypothyroidism     Past Surgical History  Procedure Laterality Date  . Cervical cerclage    . Labial reduction    . Cervical cerclage N/A 03/09/2016    Procedure: [facility] CERCLAGE PLACEMENT;  Surgeon: Lazaro Arms, MD;  Location: [facility];  Service: Gynecology;  Laterality: N/A;    Family History  Problem Relation Age of Onset  . Arthritis Mother   . Lupus Mother   . Autoimmune disease Mother   . Cancer Maternal Grandmother     breast  . Heart disease Maternal Grandfather   . Heart disease Paternal Grandfather   . Hypertension Paternal Grandfather     Social History  Substance Use Topics  . Smoking status: Never Smoker   . Smokeless tobacco: Never Used  . Alcohol Use: No    No Known Allergies  Prescriptions prior to admission  Medication Sig Dispense Refill Last Dose  . docusate sodium (COLACE) 250 MG capsule Take 250 mg by mouth 2 (two) times daily as needed for constipation.   Past Month at Unknown time  . HYDROXYprogesterone Caproate (MAKENA IM) Inject 1 mL into the muscle.   Past Week at Unknown time  . levothyroxine (SYNTHROID) 50 MCG tablet Take 1 tablet (50 mcg total) by mouth daily before breakfast. 30 tablet 3 04/08/2016 at Unknown time  . Prenatal Vit-Fe Fumarate-FA (PRENATAL MULTIVITAMIN) TABS tablet Take 1 tablet by mouth at bedtime.    04/08/2016 at Unknown time  . progesterone (PROMETRIUM)  200 MG capsule Place 200 mg vaginally at bedtime.   04/08/2016 at Unknown time  . nitrofurantoin, macrocrystal-monohydrate, (MACROBID) 100 MG capsule Take 1 capsule (100 mg total) by mouth 2 (two) times daily. For uti (Patient not taking: Reported on 04/09/2016) 14 capsule 0 Completed Course at Unknown time    Review of Systems - Negative except for what is mentioned in HPI.  Physical Exam  Blood pressure 129/74, pulse 106, temperature 98.1 F (36.7 C), temperature source Oral, resp. rate 18, height  (1.499 m), weight 171 lb (77.565 kg), last menstrual period 10/31/2015. GENERAL: Well-developed, well-nourished female in no acute distress.  LUNGS: Clear to auscultation bilaterally.  HEART: Regular rate and rhythm. ABDOMEN: Soft, nontender, nondistended, gravid.  EXTREMITIES: Nontender, no edema, 2+ distal pulses. Cervical Exam: deferred FHT:  140s   Labs: Results for orders placed or performed during the hospital encounter of 04/09/16 (from the past 24 hour(s))  Urinalysis, Routine w reflex microscopic (not at [facility])   Collection Time: 04/09/16  1:15 PM  Result Value Ref Range   Color, Urine YELLOW YELLOW   APPearance CLEAR CLEAR   Specific Gravity, Urine 1.015 1.005 - 1.030   pH 7.0 5.0 - 8.0   Glucose, UA NEGATIVE NEGATIVE mg/dL   Hgb urine dipstick NEGATIVE NEGATIVE   Bilirubin Urine NEGATIVE NEGATIVE   Ketones, ur NEGATIVE NEGATIVE mg/dL  Protein, ur NEGATIVE NEGATIVE mg/dL   Nitrite NEGATIVE NEGATIVE   Leukocytes, UA SMALL (A) NEGATIVE  Urine microscopic-add on   Collection Time: 04/09/16  1:15 PM  Result Value Ref Range   Squamous Epithelial / LPF 6-30 (A) NONE SEEN   WBC, UA 6-30 0 - 5 WBC/hpf   RBC / HPF 0-5 0 - 5 RBC/hpf   Bacteria, UA MANY (A) NONE SEEN    Imaging Studies:  [place] Ob Transvaginal  03/23/2016  FOLLOW UP SONOGRAM Tara Mcmahon is in the office for a follow up sonogram for CX length. She is a 20 y.o. year old G3P0111 with Estimated Date of  Delivery: 09/03/16 by early ultrasound now at  843w4d weeks gestation. Thus far the pregnancy has been complicated by PCOS,hypothyroidism,hx of preterm delivery..s/p cerclage GESTATION: SINGLETON PRESENTATION: breech FETAL ACTIVITY:          Heart rate         149          The fetus is active. AMNIOTIC FLUID: The amniotic fluid volume is  normal, 4.2 cm.svp PLACENTA LOCALIZATION:  posterior GRADE 0 CERVIX: Measures 4.4 cm w/and w/o pressure ADNEXA: The ovaries are normal. GESTATIONAL AGE AND  BIOMETRICS: Gestational criteria: Estimated Date of Delivery: 09/03/16 by early ultrasound now at 2143w4d Previous Scans:2 SUSPECTED ABNORMALITIES:  no QUALITY OF SCAN: satisfactory TECHNICIAN COMMENTS: US TV 16+4 wks,post pl gr 0, cx 4.4 cm to cerclage w and w/o pressure,svp of fluid 4.2 cm,normal ov's bilat,fhr 149 bpm, A copy of this report including all images has been saved and backed up to a second source for retrieval if needed. All measures and details of the anatomical scan, placentation, fluid volume and pelvic anatomy are contained in that report. [name] 03/23/2016 3:14 PM Clinical Impression and recommendations: I have reviewed the sonogram results above, combined with the patient's current clinical course, below are my impressions and any appropriate recommendations for management based on the sonographic findings. 7843w4d normal with excellent cervical length s/p cerclage 20 week scan in 3 weeks Recommend routine care unless otherwise clinically indicated EURE,LUTHER H 03/23/2016 3:43 PM    Assessment: Tara Mcmahon is  20 y.o. [id number] at 3242w0d presents with fall. Low impact, did not hit head or abdomen. At home had some abdominal cramping but resolved by the time she arrived. On exam uterus non-tender, no vaginal bleeding, normal fetal heart tones. Monitored 3 hours with serial FHTs and abdominal exams and exam remained unchanged. Original plan to monitor for 4 but patient requested d/c after 3 and said that's  reasonable. No symptoms of pprom.  Plan: - d/c home with abruption and ptl return precautions  Silvano Bilisoah B Bradley Bostelman 5/13/20174:37 PM

## 2016-04-09 NOTE — MAU Note (Signed)
Fell down stairs. crampy but most pain in lower back

## 2016-04-13 ENCOUNTER — Encounter: Payer: Medicaid Other | Admitting: Obstetrics and Gynecology

## 2016-04-13 ENCOUNTER — Other Ambulatory Visit: Payer: Medicaid Other

## 2016-04-14 ENCOUNTER — Ambulatory Visit (INDEPENDENT_AMBULATORY_CARE_PROVIDER_SITE_OTHER): Payer: Medicaid Other

## 2016-04-14 ENCOUNTER — Other Ambulatory Visit: Payer: Self-pay | Admitting: Obstetrics & Gynecology

## 2016-04-14 ENCOUNTER — Ambulatory Visit (INDEPENDENT_AMBULATORY_CARE_PROVIDER_SITE_OTHER): Payer: Medicaid Other | Admitting: Obstetrics & Gynecology

## 2016-04-14 VITALS — BP 120/60 | HR 114 | Wt 170.0 lb

## 2016-04-14 DIAGNOSIS — O3432 Maternal care for cervical incompetence, second trimester: Secondary | ICD-10-CM

## 2016-04-14 DIAGNOSIS — O0992 Supervision of high risk pregnancy, unspecified, second trimester: Secondary | ICD-10-CM

## 2016-04-14 DIAGNOSIS — Z1389 Encounter for screening for other disorder: Secondary | ICD-10-CM | POA: Diagnosis not present

## 2016-04-14 DIAGNOSIS — Z3492 Encounter for supervision of normal pregnancy, unspecified, second trimester: Secondary | ICD-10-CM

## 2016-04-14 DIAGNOSIS — Z8751 Personal history of pre-term labor: Secondary | ICD-10-CM

## 2016-04-14 DIAGNOSIS — Z3A20 20 weeks gestation of pregnancy: Secondary | ICD-10-CM | POA: Diagnosis not present

## 2016-04-14 DIAGNOSIS — O09292 Supervision of pregnancy with other poor reproductive or obstetric history, second trimester: Secondary | ICD-10-CM

## 2016-04-14 DIAGNOSIS — O09212 Supervision of pregnancy with history of pre-term labor, second trimester: Secondary | ICD-10-CM

## 2016-04-14 DIAGNOSIS — Z331 Pregnant state, incidental: Secondary | ICD-10-CM | POA: Diagnosis not present

## 2016-04-14 LAB — POCT URINALYSIS DIPSTICK
Glucose, UA: NEGATIVE
KETONES UA: NEGATIVE
LEUKOCYTES UA: NEGATIVE
NITRITE UA: NEGATIVE
PROTEIN UA: NEGATIVE

## 2016-04-14 MED ORDER — HYDROXYPROGESTERONE CAPROATE 250 MG/ML IM OIL
250.0000 mg | TOPICAL_OIL | Freq: Once | INTRAMUSCULAR | Status: AC
  Administered 2016-04-14: 250 mg via INTRAMUSCULAR

## 2016-04-14 NOTE — Progress Notes (Signed)
US w/TV 19+5 wks,measurements c/w dates,normal ov's bilat,post pl gr 0,svp of fluid 6.7 cm,fhr 149 bpm,cx 5 cm w/and w/o pressure,anatomy complete,no obvious abnormalities seen

## 2016-04-14 NOTE — Progress Notes (Signed)
[id number] 3349w5d Estimated Date of Delivery: 09/03/16  Blood pressure 120/60, pulse 114, weight 170 lb (77.111 kg), last menstrual period 10/31/2015.   BP weight and urine results all reviewed and noted.  Please refer to the obstetrical flow sheet for the fundal height and fetal heart rate documentation:  Patient reports good fetal movement, denies any bleeding and no rupture of membranes symptoms or regular contractions. Patient is without complaints. All questions were answered.  Orders Placed This Encounter  Procedures  . POCT urinalysis dipstick    Plan:  Continued routine obstetrical care, sonogram is normal with excellent cervical length  No Follow-up on file.

## 2016-04-21 ENCOUNTER — Encounter: Payer: Self-pay | Admitting: *Deleted

## 2016-04-21 ENCOUNTER — Telehealth: Payer: Self-pay | Admitting: *Deleted

## 2016-04-21 ENCOUNTER — Ambulatory Visit (INDEPENDENT_AMBULATORY_CARE_PROVIDER_SITE_OTHER): Payer: Medicaid Other | Admitting: *Deleted

## 2016-04-21 VITALS — BP 130/68 | HR 80 | Wt 170.0 lb

## 2016-04-21 DIAGNOSIS — Z3A21 21 weeks gestation of pregnancy: Secondary | ICD-10-CM | POA: Diagnosis not present

## 2016-04-21 DIAGNOSIS — O09212 Supervision of pregnancy with history of pre-term labor, second trimester: Secondary | ICD-10-CM

## 2016-04-21 DIAGNOSIS — Z1389 Encounter for screening for other disorder: Secondary | ICD-10-CM

## 2016-04-21 DIAGNOSIS — Z331 Pregnant state, incidental: Secondary | ICD-10-CM

## 2016-04-21 DIAGNOSIS — O09892 Supervision of other high risk pregnancies, second trimester: Secondary | ICD-10-CM

## 2016-04-21 LAB — POCT URINALYSIS DIPSTICK
Blood, UA: NEGATIVE
Glucose, UA: NEGATIVE
KETONES UA: NEGATIVE
Nitrite, UA: NEGATIVE
Protein, UA: NEGATIVE

## 2016-04-21 MED ORDER — HYDROXYPROGESTERONE CAPROATE 250 MG/ML IM OIL
250.0000 mg | TOPICAL_OIL | Freq: Once | INTRAMUSCULAR | Status: AC
  Administered 2016-04-21: 250 mg via INTRAMUSCULAR

## 2016-04-21 NOTE — Telephone Encounter (Signed)
Benadryl OTC for anxiety  And  Yes we can 1 more sonogram

## 2016-04-21 NOTE — Telephone Encounter (Signed)
Dr. Despina HiddenEure, when Tara Mcmahon was here for her 17P she was wanting to know if there was anything you could suggest for anxiety other than medications.  She states that as it gets closer to the time in this pregnancy that things went wrong in her last one she having a lot of anxiety.  She also thinks it will help if she could get at least one more U/S to measure her cervical length around 22 - 23 wks just ho give her some reassurance.  Please advise

## 2016-04-21 NOTE — Progress Notes (Signed)
Pt here today for her weekly 17P injection.  Pt reports no problems at this time but she is wanting suggestions for help with anxiety as it gets close to time that her last pregnancy went wrong.  Informed pt I would route a message to Dr. Despina HiddenEure asking for suggestions.  She wants to see if we can measure her cervix at least one more time at about 22 - 23 wks.  Pt tolerated injection well and was told to f/u in 1 wk for next injection and that we would give her a call back once Dr. Despina HiddenEure addresses her concerns.

## 2016-04-21 NOTE — Telephone Encounter (Signed)
Pt informed per Dr. Despina HiddenEure to try using Benadryl for the anxiety and he states she can get one more U/S for reassurance.  Pt informed to call back tomorrow and speak with the front office staff to schedule the U/S.  Pt verbalized understanding.

## 2016-04-26 ENCOUNTER — Encounter: Payer: Self-pay | Admitting: Obstetrics and Gynecology

## 2016-04-28 ENCOUNTER — Ambulatory Visit (INDEPENDENT_AMBULATORY_CARE_PROVIDER_SITE_OTHER): Payer: Medicaid Other | Admitting: *Deleted

## 2016-04-28 ENCOUNTER — Encounter: Payer: Self-pay | Admitting: *Deleted

## 2016-04-28 ENCOUNTER — Encounter: Payer: Self-pay | Admitting: Obstetrics & Gynecology

## 2016-04-28 ENCOUNTER — Ambulatory Visit (INDEPENDENT_AMBULATORY_CARE_PROVIDER_SITE_OTHER): Payer: Medicaid Other | Admitting: Obstetrics & Gynecology

## 2016-04-28 VITALS — BP 120/80 | HR 100 | Ht 59.0 in | Wt 174.0 lb

## 2016-04-28 DIAGNOSIS — R102 Pelvic and perineal pain: Secondary | ICD-10-CM | POA: Diagnosis not present

## 2016-04-28 DIAGNOSIS — O09892 Supervision of other high risk pregnancies, second trimester: Secondary | ICD-10-CM

## 2016-04-28 DIAGNOSIS — M533 Sacrococcygeal disorders, not elsewhere classified: Secondary | ICD-10-CM

## 2016-04-28 DIAGNOSIS — Z3492 Encounter for supervision of normal pregnancy, unspecified, second trimester: Secondary | ICD-10-CM

## 2016-04-28 DIAGNOSIS — O26899 Other specified pregnancy related conditions, unspecified trimester: Secondary | ICD-10-CM | POA: Diagnosis not present

## 2016-04-28 DIAGNOSIS — Z1389 Encounter for screening for other disorder: Secondary | ICD-10-CM | POA: Diagnosis not present

## 2016-04-28 DIAGNOSIS — Z3A22 22 weeks gestation of pregnancy: Secondary | ICD-10-CM

## 2016-04-28 DIAGNOSIS — Z331 Pregnant state, incidental: Secondary | ICD-10-CM

## 2016-04-28 DIAGNOSIS — O09212 Supervision of pregnancy with history of pre-term labor, second trimester: Secondary | ICD-10-CM | POA: Diagnosis not present

## 2016-04-28 LAB — POCT URINALYSIS DIPSTICK
GLUCOSE UA: NEGATIVE
KETONES UA: NEGATIVE
Leukocytes, UA: NEGATIVE
Nitrite, UA: NEGATIVE
PROTEIN UA: NEGATIVE
RBC UA: NEGATIVE

## 2016-04-28 MED ORDER — HYDROXYPROGESTERONE CAPROATE 250 MG/ML IM OIL
250.0000 mg | TOPICAL_OIL | Freq: Once | INTRAMUSCULAR | Status: AC
  Administered 2016-04-28: 250 mg via INTRAMUSCULAR

## 2016-04-28 NOTE — Progress Notes (Signed)
Patient ID: Tara Mcmahon, female   DOB: Dec 05, 1995, 20 y.o.   MRN: [id number] Pt here today for 17P injection and complains of terrible pelvic pain. Pt states that it is worse with movement and bending. Pt denies any bleeding or contractions.

## 2016-04-28 NOTE — Progress Notes (Signed)
Work in [employer]       Chief Complaint  Patient presents with  . work-in-ob    pain pubic/ tale born    Last menstrual period 10/31/2015.  20 y.o. G3P0111 Patient's last menstrual period was 10/31/2015. The current method of family planning is none. shes pregnant  Subjective Pt fell on her tail about 2 weeks ago and began to have coccygeal pain at that time which began radiating to her pubis last evening when she moved to get up No bleeding or contractions or pressure per se  Objective Some hard stool in rectum Cervix long thick closed cerclage intact Point tenderness of coccyx and down from the tip Pubis tender in midline  Pertinent ROS Per HPI  Labs or studies none    Impression Diagnoses this Encounter::   ICD-9-CM ICD-10-CM   1. Coccyx pain 724.79 M53.3    Specifically pubococcygeal ligament injury Established relevant diagnosis(es): Cervical incompetence  Plan/Recommendations: No orders of the defined types were placed in this encounter.    Labs or Scans Ordered: No orders of the defined types were placed in this encounter.    Management:: Prenatal cradle and an inflatable donut acetaminophen  Follow up Return for keep scheduled.         All questions were answered.

## 2016-05-02 ENCOUNTER — Other Ambulatory Visit: Payer: Self-pay | Admitting: Obstetrics & Gynecology

## 2016-05-02 DIAGNOSIS — Z0375 Encounter for suspected cervical shortening ruled out: Secondary | ICD-10-CM

## 2016-05-05 ENCOUNTER — Encounter: Payer: Self-pay | Admitting: Obstetrics & Gynecology

## 2016-05-05 ENCOUNTER — Ambulatory Visit (INDEPENDENT_AMBULATORY_CARE_PROVIDER_SITE_OTHER): Payer: Medicaid Other | Admitting: Obstetrics & Gynecology

## 2016-05-05 ENCOUNTER — Ambulatory Visit (INDEPENDENT_AMBULATORY_CARE_PROVIDER_SITE_OTHER): Payer: Medicaid Other

## 2016-05-05 VITALS — BP 110/60 | HR 88 | Wt 173.0 lb

## 2016-05-05 DIAGNOSIS — O09292 Supervision of pregnancy with other poor reproductive or obstetric history, second trimester: Secondary | ICD-10-CM | POA: Diagnosis not present

## 2016-05-05 DIAGNOSIS — O09212 Supervision of pregnancy with history of pre-term labor, second trimester: Secondary | ICD-10-CM | POA: Diagnosis not present

## 2016-05-05 DIAGNOSIS — Z3492 Encounter for supervision of normal pregnancy, unspecified, second trimester: Secondary | ICD-10-CM | POA: Diagnosis not present

## 2016-05-05 DIAGNOSIS — O3432 Maternal care for cervical incompetence, second trimester: Secondary | ICD-10-CM | POA: Diagnosis not present

## 2016-05-05 DIAGNOSIS — O09892 Supervision of other high risk pregnancies, second trimester: Secondary | ICD-10-CM

## 2016-05-05 DIAGNOSIS — Z1389 Encounter for screening for other disorder: Secondary | ICD-10-CM

## 2016-05-05 DIAGNOSIS — Z331 Pregnant state, incidental: Secondary | ICD-10-CM

## 2016-05-05 DIAGNOSIS — Z0375 Encounter for suspected cervical shortening ruled out: Secondary | ICD-10-CM

## 2016-05-05 DIAGNOSIS — Z3A23 23 weeks gestation of pregnancy: Secondary | ICD-10-CM

## 2016-05-05 LAB — POCT URINALYSIS DIPSTICK
Blood, UA: NEGATIVE
Glucose, UA: NEGATIVE
KETONES UA: NEGATIVE
LEUKOCYTES UA: NEGATIVE
Nitrite, UA: NEGATIVE

## 2016-05-05 MED ORDER — HYDROXYPROGESTERONE CAPROATE 250 MG/ML IM OIL
250.0000 mg | TOPICAL_OIL | Freq: Once | INTRAMUSCULAR | Status: AC
  Administered 2016-05-05: 250 mg via INTRAMUSCULAR

## 2016-05-05 NOTE — Addendum Note (Signed)
Addended by: Federico FlakeNES, PEGGY A on: 05/05/2016 03:01 PM   Modules accepted: Orders

## 2016-05-05 NOTE — Progress Notes (Signed)
US 22+5 wks,post pl,cephalic,svp of fluid 5.9 cm,fhr 160 bpm,normal ov's bilat,cx length 5.4 with and w/o pressure

## 2016-05-05 NOTE — Progress Notes (Signed)
[id number] 3572w5d Estimated Date of Delivery: 09/03/16  Blood pressure 110/60, pulse 88, weight 173 lb (78.472 kg), last menstrual period 10/31/2015.   BP weight and urine results all reviewed and noted.  Please refer to the obstetrical flow sheet for the fundal height and fetal heart rate documentation:  Patient reports good fetal movement, denies any bleeding and no rupture of membranes symptoms or regular contractions. Patient is without complaints. All questions were answered.  Orders Placed This Encounter  Procedures  . POCT urinalysis dipstick    Plan:  Continued routine obstetrical care, sonogram reveals cervical length 5.4 cm cerclage in place  No Follow-up on file.

## 2016-05-12 ENCOUNTER — Encounter: Payer: Self-pay | Admitting: *Deleted

## 2016-05-12 ENCOUNTER — Ambulatory Visit (INDEPENDENT_AMBULATORY_CARE_PROVIDER_SITE_OTHER): Payer: Medicaid Other | Admitting: *Deleted

## 2016-05-12 VITALS — BP 120/60 | HR 90 | Ht 59.0 in | Wt 176.0 lb

## 2016-05-12 DIAGNOSIS — Z331 Pregnant state, incidental: Secondary | ICD-10-CM

## 2016-05-12 DIAGNOSIS — O09212 Supervision of pregnancy with history of pre-term labor, second trimester: Secondary | ICD-10-CM | POA: Diagnosis not present

## 2016-05-12 DIAGNOSIS — Z3492 Encounter for supervision of normal pregnancy, unspecified, second trimester: Secondary | ICD-10-CM

## 2016-05-12 DIAGNOSIS — Z1389 Encounter for screening for other disorder: Secondary | ICD-10-CM

## 2016-05-12 DIAGNOSIS — O09892 Supervision of other high risk pregnancies, second trimester: Secondary | ICD-10-CM

## 2016-05-12 LAB — POCT URINALYSIS DIPSTICK
Glucose, UA: NEGATIVE
Ketones, UA: NEGATIVE
LEUKOCYTES UA: NEGATIVE
NITRITE UA: NEGATIVE
PROTEIN UA: NEGATIVE
RBC UA: NEGATIVE

## 2016-05-12 MED ORDER — HYDROXYPROGESTERONE CAPROATE 250 MG/ML IM OIL
250.0000 mg | TOPICAL_OIL | Freq: Once | INTRAMUSCULAR | Status: AC
  Administered 2016-05-12: 250 mg via INTRAMUSCULAR

## 2016-05-12 NOTE — Progress Notes (Signed)
Pt here for 17P. Pt tolerated shot well. Return in 1 week for next shot. JSY 

## 2016-05-16 ENCOUNTER — Telehealth: Payer: Self-pay | Admitting: Obstetrics & Gynecology

## 2016-05-16 NOTE — Telephone Encounter (Signed)
Pt states that she has a cerclage and is having a stinging sensation after the baby moves and wants to know at what point should she be concerned with the pain.  I spoke with Dr. Despina HiddenEure while the patient was still on the phone and he said to tell her that  "she is still growing another human being inside her" and not to worry.  Informed pt of his statement.  Also informed her that she should not worry unless has a flow of blood or passing clots, sudden gush of fluids or contractions that are getting stronger and closer together.  Pt verbalized understanding.

## 2016-05-19 ENCOUNTER — Encounter: Payer: Self-pay | Admitting: *Deleted

## 2016-05-19 ENCOUNTER — Ambulatory Visit (INDEPENDENT_AMBULATORY_CARE_PROVIDER_SITE_OTHER): Payer: Medicaid Other | Admitting: *Deleted

## 2016-05-19 ENCOUNTER — Encounter: Payer: Self-pay | Admitting: Obstetrics & Gynecology

## 2016-05-19 VITALS — BP 122/78 | HR 118 | Ht 59.0 in | Wt 178.5 lb

## 2016-05-19 DIAGNOSIS — O09892 Supervision of other high risk pregnancies, second trimester: Secondary | ICD-10-CM

## 2016-05-19 DIAGNOSIS — O09212 Supervision of pregnancy with history of pre-term labor, second trimester: Secondary | ICD-10-CM | POA: Diagnosis not present

## 2016-05-19 DIAGNOSIS — Z1389 Encounter for screening for other disorder: Secondary | ICD-10-CM

## 2016-05-19 DIAGNOSIS — Z3492 Encounter for supervision of normal pregnancy, unspecified, second trimester: Secondary | ICD-10-CM

## 2016-05-19 DIAGNOSIS — Z331 Pregnant state, incidental: Secondary | ICD-10-CM

## 2016-05-19 LAB — POCT URINALYSIS DIPSTICK
Ketones, UA: NEGATIVE
NITRITE UA: NEGATIVE
RBC UA: NEGATIVE

## 2016-05-19 MED ORDER — HYDROXYPROGESTERONE CAPROATE 250 MG/ML IM OIL
250.0000 mg | TOPICAL_OIL | Freq: Once | INTRAMUSCULAR | Status: AC
  Administered 2016-05-19: 250 mg via INTRAMUSCULAR

## 2016-05-19 NOTE — Progress Notes (Signed)
Pt here for 17P. Pt tolerated shot well. Return in 1 week for next shot. JSY 

## 2016-05-21 ENCOUNTER — Inpatient Hospital Stay (HOSPITAL_COMMUNITY)
Admission: AD | Admit: 2016-05-21 | Discharge: 2016-05-22 | Disposition: A | Discharge status: Home / Self Care | Payer: Medicaid Other | Source: Ambulatory Visit | Attending: Obstetrics and Gynecology | Admitting: Obstetrics and Gynecology

## 2016-05-21 ENCOUNTER — Encounter (HOSPITAL_COMMUNITY): Payer: Self-pay

## 2016-05-21 DIAGNOSIS — Z3A25 25 weeks gestation of pregnancy: Secondary | ICD-10-CM

## 2016-05-21 DIAGNOSIS — E039 Hypothyroidism, unspecified: Secondary | ICD-10-CM | POA: Diagnosis not present

## 2016-05-21 DIAGNOSIS — O99344 Other mental disorders complicating childbirth: Secondary | ICD-10-CM | POA: Diagnosis not present

## 2016-05-21 DIAGNOSIS — F418 Other specified anxiety disorders: Secondary | ICD-10-CM | POA: Insufficient documentation

## 2016-05-21 DIAGNOSIS — N949 Unspecified condition associated with female genital organs and menstrual cycle: Secondary | ICD-10-CM | POA: Diagnosis not present

## 2016-05-21 DIAGNOSIS — O9989 Other specified diseases and conditions complicating pregnancy, childbirth and the puerperium: Secondary | ICD-10-CM | POA: Diagnosis not present

## 2016-05-21 DIAGNOSIS — R102 Pelvic and perineal pain: Secondary | ICD-10-CM | POA: Diagnosis not present

## 2016-05-21 DIAGNOSIS — E282 Polycystic ovarian syndrome: Secondary | ICD-10-CM | POA: Diagnosis not present

## 2016-05-21 DIAGNOSIS — O99282 Endocrine, nutritional and metabolic diseases complicating pregnancy, second trimester: Secondary | ICD-10-CM | POA: Insufficient documentation

## 2016-05-21 DIAGNOSIS — O26892 Other specified pregnancy related conditions, second trimester: Secondary | ICD-10-CM | POA: Insufficient documentation

## 2016-05-21 DIAGNOSIS — R109 Unspecified abdominal pain: Secondary | ICD-10-CM | POA: Diagnosis present

## 2016-05-21 LAB — URINALYSIS, ROUTINE W REFLEX MICROSCOPIC
BILIRUBIN URINE: NEGATIVE
GLUCOSE, UA: NEGATIVE mg/dL
HGB URINE DIPSTICK: NEGATIVE
KETONES UR: NEGATIVE mg/dL
Nitrite: NEGATIVE
PH: 7 (ref 5.0–8.0)
PROTEIN: NEGATIVE mg/dL
Specific Gravity, Urine: <1.005 — ABNORMAL LOW (ref 1.005–1.030)

## 2016-05-21 LAB — URINE MICROSCOPIC-ADD ON: RBC / HPF: NONE SEEN RBC/hpf (ref 0–5)

## 2016-05-21 NOTE — MAU Provider Note (Signed)
MAU HISTORY AND PHYSICAL  Chief Complaint:  Abdominal Pain   Tara Mcmahon is a 20 y.o.  [phone] with IUP at 1235w0d presenting for Abdominal Pain Pt reports right RLQ tenderness, constant, intense. Denies any fever, chills, n/v, pain with walking or positional Patient states she has been having  none contractions, none vaginal bleeding, intact membranes, with active fetal movement.    Past Medical History  Diagnosis Date  . PCOS (polycystic ovarian syndrome)   . Depression   . Anxiety   . Cervical incompetence during pregnancy   . Hypothyroidism     Past Surgical History  Procedure Laterality Date  . Cervical cerclage    . Labial reduction    . Cervical cerclage N/A 03/09/2016    [facility];  Surgeon: Lazaro ArmsLuther H Eure, MD;  Location: [facility];  Service: Gynecology;  Laterality: N/A;    Family History  Problem Relation Age of Onset  . Arthritis Mother   . Lupus Mother   . Autoimmune disease Mother   . Cancer Maternal Grandmother     breast  . Heart disease Maternal Grandfather   . Heart disease Paternal Grandfather   . Hypertension Paternal Grandfather     Social History  Substance Use Topics  . Smoking status: Never Smoker   . Smokeless tobacco: Never Used  . Alcohol Use: No    No Known Allergies  Prescriptions prior to admission  Medication Sig Dispense Refill Last Dose  . docusate sodium (COLACE) 250 MG capsule Take 250 mg by mouth 2 (two) times daily as needed for constipation.   05/20/2016 at Unknown time  . HYDROXYprogesterone Caproate (MAKENA IM) Inject 1 mL into the muscle.   Past Week at Unknown time  . levothyroxine (SYNTHROID) 50 MCG tablet Take 1 tablet (50 mcg total) by mouth daily before breakfast. 30 tablet 3 05/20/2016 at Unknown time  . Prenatal Vit-Fe Fumarate-FA (PRENATAL MULTIVITAMIN) TABS tablet Take 1 tablet by mouth at bedtime.    05/20/2016 at Unknown time  . progesterone (PROMETRIUM) 200 MG capsule Place 200 mg vaginally  at bedtime.   05/20/2016 at Unknown time    Review of Systems - Negative except for what is mentioned in HPI.  Physical Exam  Blood pressure 137/76, pulse 101, temperature 98.4 F (36.9 C), resp. rate 18, height 4\' 11"  (1.499 m), weight 178 lb 3.2 oz (80.831 kg), last menstrual period 10/31/2015. GENERAL: Well-developed, well-nourished female in no acute distress.  LUNGS: Clear to auscultation bilaterally.  HEART: Regular rate and rhythm. ABDOMEN: Soft, nontender, nondistended, gravid.  EXTREMITIES: Nontender, no edema, 2+ distal pulses. Cervical Exam: Deferred Presentation: Unknown FHT:  Baseline 145, mod variability, +acels, no decels-reactive Contractions: None   Labs: Results for orders placed or performed during the hospital encounter of 05/21/16 (from the past 24 hour(s))  Urinalysis, Routine w reflex microscopic ([facility])   Collection Time: 05/21/16 11:05 PM  Result Value Ref Range   Color, Urine YELLOW YELLOW   APPearance CLEAR CLEAR   Specific Gravity, Urine <1.005 (L) 1.005 - 1.030   pH 7.0 5.0 - 8.0   Glucose, UA NEGATIVE NEGATIVE mg/dL   Hgb urine dipstick NEGATIVE NEGATIVE   Bilirubin Urine NEGATIVE NEGATIVE   Ketones, ur NEGATIVE NEGATIVE mg/dL   Protein, ur NEGATIVE NEGATIVE mg/dL   Nitrite NEGATIVE NEGATIVE   Leukocytes, UA SMALL (A) NEGATIVE  Urine microscopic-add on   Collection Time: 05/21/16 11:05 PM  Result Value Ref Range   Squamous Epithelial / LPF 0-5 (  A) NONE SEEN   WBC, UA 6-30 0 - 5 WBC/hpf   RBC / HPF NONE SEEN 0 - 5 RBC/hpf   Bacteria, UA FEW (A) NONE SEEN    Imaging Studies:  [place] Ob Transvaginal  05/05/2016  FOLLOW UP SONOGRAM Tara Mcmahon is in the office for a follow up sonogram for cervical length. She is a 20 y.o. year old G3P0111 with Estimated Date of Delivery: 09/03/16 by early ultrasound now at  7132w5d weeks gestation. Thus far the pregnancy has been complicated by hx of preterm delivery,cerclage.. GESTATION: SINGLETON PRESENTATION:  cephalic FETAL ACTIVITY:          Heart rate         160          The fetus is active. AMNIOTIC FLUID: The amniotic fluid volume is  normal, 5.9 cm.svp PLACENTA LOCALIZATION:  posterior GRADE 0 CERVIX: Measures 5.4 cm ADNEXA: The ovaries are normal. GESTATIONAL AGE AND  BIOMETRICS: Gestational criteria: Estimated Date of Delivery: 09/03/16 by LMP now at 4232w5d Previous Scans:4 SUSPECTED ABNORMALITIES:  no QUALITY OF SCAN: satisfactory TECHNICIAN COMMENTS: US 22+5 wks,post pl,cephalic,svp of fluid 5.9 cm,fhr 160 bpm,normal ov's bilat,cx length 5.4 with and w/o pressure A copy of this report including all images has been saved and backed up to a second source for retrieval if needed. All measures and details of the anatomical scan, placentation, fluid volume and pelvic anatomy are contained in that report. [name] 05/05/2016 2:12 PM Clinical Impression and recommendations: I have reviewed the sonogram results above, combined with the patient's current clinical course, below are my impressions and any appropriate recommendations for management based on the sonographic findings. 1.  [id number] Estimated Date of Delivery: 09/03/16 by serial sonographic evaluations 2.  Fetal sonographic surveillance findings: a). Normal fluid volume B). Cervical length is excellent, 5.4 cm 3.  Normal general sonographic findings Recommend continued prenatal evaluations and care based on this sonogram and as clinically indicated from the patient's clinical course. Lazaro ArmsURE,LUTHER H 05/05/2016 2:39 PM    Assessment: Tara Mcmahon is  20 y.o. [id number] at 7062w0d presents with Abdominal Pain .  Plan: #Round ligament pain -SVE showed closed cervix -D/c to home with preterm labor precautions  Olena LeatherwoodKelly M Aguilar 6/24/201711:30 PM  I spoke with and examined patient and agree with resident/PA/SNM's note and plan of care.  Pt w/ report of constant RLQ pulling/tenderness-worse w/ movement tonight w/ some cramping earlier. Reports good fm, denies  vb, lof, abnormal d/c, itching/odor/irritation, or uti s/s. Denies fever/chills, n/v.  H/O 26wk PTB d/t pprom after cervical incompetence previous pregnancy, has prophylactic cerclage this pregnancy, on vaginal progesterone, and weekly Makena. CL 5.4cm on 6/8 @ 22wks.  Abd non-tender to palpation Spec exam: cx visually closed, long, no tension noted on cerclage FHR Cat 1 No uc's on efm C/W RLP D/C home, keep next appt on 7/6 at [facility] as scheduled Reviewed ptl s/s, fm, reasons to return Cheral MarkerKimberly R. Drevin Ortner, CNM, [facility]NP-BC 05/22/2016 12:26 AM

## 2016-05-21 NOTE — MAU Note (Addendum)
Hx 26wk delivery with abruption after PPROM. Has cerclage. No bleeding. Having abdominal tenderness tonight R side which is worse with movement. Occ braxton hicks ctx.

## 2016-05-22 DIAGNOSIS — Z3A25 25 weeks gestation of pregnancy: Secondary | ICD-10-CM | POA: Diagnosis not present

## 2016-05-22 DIAGNOSIS — O9989 Other specified diseases and conditions complicating pregnancy, childbirth and the puerperium: Secondary | ICD-10-CM | POA: Diagnosis not present

## 2016-05-22 DIAGNOSIS — N949 Unspecified condition associated with female genital organs and menstrual cycle: Secondary | ICD-10-CM | POA: Diagnosis not present

## 2016-05-22 NOTE — Discharge Instructions (Signed)
Second Trimester of Pregnancy The second trimester is from week 13 through week 28, months 4 through 6. The second trimester is often a time when you feel your best. Your body has also adjusted to being pregnant, and you begin to feel better physically. Usually, morning sickness has lessened or quit completely, you may have more energy, and you may have an increase in appetite. The second trimester is also a time when the fetus is growing rapidly. At the end of the sixth month, the fetus is about 9 inches long and weighs about 1 pounds. You will likely begin to feel the baby move (quickening) between 18 and 20 weeks of the pregnancy. BODY CHANGES Your body goes through many changes during pregnancy. The changes vary from woman to woman.   Your weight will continue to increase. You will notice your lower abdomen bulging out.  You may begin to get stretch marks on your hips, abdomen, and breasts.  You may develop headaches that can be relieved by medicines approved by your health care provider.  You may urinate more often because the fetus is pressing on your bladder.  You may develop or continue to have heartburn as a result of your pregnancy.  You may develop constipation because certain hormones are causing the muscles that push waste through your intestines to slow down.  You may develop hemorrhoids or swollen, bulging veins (varicose veins).  You may have back pain because of the weight gain and pregnancy hormones relaxing your joints between the bones in your pelvis and as a result of a shift in weight and the muscles that support your balance.  Your breasts will continue to grow and be tender.  Your gums may bleed and may be sensitive to brushing and flossing.  Dark spots or blotches (chloasma, mask of pregnancy) may develop on your face. This will likely fade after the baby is born.  A dark line from your belly button to the pubic area (linea nigra) may appear. This will likely fade  after the baby is born.  You may have changes in your hair. These can include thickening of your hair, rapid growth, and changes in texture. Some women also have hair loss during or after pregnancy, or hair that feels dry or thin. Your hair will most likely return to normal after your baby is born. WHAT TO EXPECT AT YOUR PRENATAL VISITS During a routine prenatal visit:  You will be weighed to make sure you and the fetus are growing normally.  Your blood pressure will be taken.  Your abdomen will be measured to track your baby's growth.  The fetal heartbeat will be listened to.  Any test results from the previous visit will be discussed. Your health care provider may ask you:  How you are feeling.  If you are feeling the baby move.  If you have had any abnormal symptoms, such as leaking fluid, bleeding, severe headaches, or abdominal cramping.  If you are using any tobacco products, including cigarettes, chewing tobacco, and electronic cigarettes.  If you have any questions. Other tests that may be performed during your second trimester include:  Blood tests that check for:  Low iron levels (anemia).  Gestational diabetes (between 24 and 28 weeks).  Rh antibodies.  Urine tests to check for infections, diabetes, or protein in the urine.  An ultrasound to confirm the proper growth and development of the baby.  An amniocentesis to check for possible genetic problems.  Fetal screens for spina bifida  and Down syndrome.  HIV (human immunodeficiency virus) testing. Routine prenatal testing includes screening for HIV, unless you choose not to have this test. HOME CARE INSTRUCTIONS   Avoid all smoking, herbs, alcohol, and unprescribed drugs. These chemicals affect the formation and growth of the baby.  Do not use any tobacco products, including cigarettes, chewing tobacco, and electronic cigarettes. If you need help quitting, ask your health care provider. You may receive  counseling support and other resources to help you quit.  Follow your health care provider's instructions regarding medicine use. There are medicines that are either safe or unsafe to take during pregnancy.  Exercise only as directed by your health care provider. Experiencing uterine cramps is a good sign to stop exercising.  Continue to eat regular, healthy meals.  Wear a good support bra for breast tenderness.  Do not use hot tubs, steam rooms, or saunas.  Wear your seat belt at all times when driving.  Avoid raw meat, uncooked cheese, cat litter boxes, and soil used by cats. These carry germs that can cause birth defects in the baby.  Take your prenatal vitamins.  Take 1500-2000 mg of calcium daily starting at the 20th week of pregnancy until you deliver your baby.  Try taking a stool softener (if your health care provider approves) if you develop constipation. Eat more high-fiber foods, such as fresh vegetables or fruit and whole grains. Drink plenty of fluids to keep your urine clear or pale yellow.  Take warm sitz baths to soothe any pain or discomfort caused by hemorrhoids. Use hemorrhoid cream if your health care provider approves.  If you develop varicose veins, wear support hose. Elevate your feet for 15 minutes, 3-4 times a day. Limit salt in your diet.  Avoid heavy lifting, wear low heel shoes, and practice good posture.  Rest with your legs elevated if you have leg cramps or low back pain.  Visit your dentist if you have not gone yet during your pregnancy. Use a soft toothbrush to brush your teeth and be gentle when you floss.  A sexual relationship may be continued unless your health care provider directs you otherwise.  Continue to go to all your prenatal visits as directed by your health care provider. SEEK MEDICAL CARE IF:   You have dizziness.  You have mild pelvic cramps, pelvic pressure, or nagging pain in the abdominal area.  You have persistent nausea,  vomiting, or diarrhea.  You have a bad smelling vaginal discharge.  You have pain with urination. SEEK IMMEDIATE MEDICAL CARE IF:   You have a fever.  You are leaking fluid from your vagina.  You have spotting or bleeding from your vagina.  You have severe abdominal cramping or pain.  You have rapid weight gain or loss.  You have shortness of breath with chest pain.  You notice sudden or extreme swelling of your face, hands, ankles, feet, or legs.  You have not felt your baby move in over an hour.  You have severe headaches that do not go away with medicine.  You have vision changes.   This information is not intended to replace advice given to you by your health care provider. Make sure you discuss any questions you have with your health care provider.   Document Released: 11/08/2001 Document Revised: 12/05/2014 Document Reviewed: 01/15/2013 Elsevier Interactive Patient Education 2016 Else[employer] Inc[employer] Round Ligament Pain The round ligament is a cord of muscle and tissue that helps to support the uterus. It can become  a source of pain during pregnancy if it becomes stretched or twisted as the baby grows. The pain usually begins in the second trimester of pregnancy, and it can come and go until the baby is delivered. It is not a serious problem, and it does not cause harm to the baby. Round ligament pain is usually a short, sharp, and pinching pain, but it can also be a dull, lingering, and aching pain. The pain is felt in the lower side of the abdomen or in the groin. It usually starts deep in the groin and moves up to the outside of the hip area. Pain can occur with:  A sudden change in position.  Rolling over in bed.  Coughing or sneezing.  Physical activity. HOME CARE INSTRUCTIONS Watch your condition for any changes. Take these steps to help with your pain:  When the pain starts, relax. Then try:  Sitting down.  Flexing your knees up to your abdomen.  Lying on  your side with one pillow under your abdomen and another pillow between your legs.  Sitting in a warm bath for 15-20 minutes or until the pain goes away.  Take over-the-counter and prescription medicines only as told by your health care provider.  Move slowly when you sit and stand.  Avoid long walks if they cause pain.  Stop or lessen your physical activities if they cause pain. SEEK MEDICAL CARE IF:  Your pain does not go away with treatment.  You feel pain in your back that you did not have before.  Your medicine is not helping. SEEK IMMEDIATE MEDICAL CARE IF:  You develop a fever or chills.  You develop uterine contractions.  You develop vaginal bleeding.  You develop nausea or vomiting.  You develop diarrhea.  You have pain when you urinate.   This information is not intended to replace advice given to you by your health care provider. Make sure you discuss any questions you have with your health care provider.   Document Released: 08/23/2008 Document Revised: 02/06/2012 Document Reviewed: 01/21/2015 Elsevier Interactive Patient Education [employer]

## 2016-05-26 ENCOUNTER — Ambulatory Visit: Payer: Medicaid Other

## 2016-05-26 ENCOUNTER — Ambulatory Visit (INDEPENDENT_AMBULATORY_CARE_PROVIDER_SITE_OTHER): Payer: Medicaid Other | Admitting: *Deleted

## 2016-05-26 ENCOUNTER — Encounter: Payer: Self-pay | Admitting: *Deleted

## 2016-05-26 VITALS — BP 120/70 | HR 98 | Ht 59.0 in | Wt 179.0 lb

## 2016-05-26 DIAGNOSIS — O09892 Supervision of other high risk pregnancies, second trimester: Secondary | ICD-10-CM

## 2016-05-26 DIAGNOSIS — O09212 Supervision of pregnancy with history of pre-term labor, second trimester: Secondary | ICD-10-CM | POA: Diagnosis not present

## 2016-05-26 DIAGNOSIS — Z3492 Encounter for supervision of normal pregnancy, unspecified, second trimester: Secondary | ICD-10-CM

## 2016-05-26 DIAGNOSIS — Z331 Pregnant state, incidental: Secondary | ICD-10-CM

## 2016-05-26 DIAGNOSIS — Z1389 Encounter for screening for other disorder: Secondary | ICD-10-CM

## 2016-05-26 LAB — POCT URINALYSIS DIPSTICK
Blood, UA: NEGATIVE
Glucose, UA: NEGATIVE
KETONES UA: NEGATIVE
Nitrite, UA: NEGATIVE
Protein, UA: NEGATIVE

## 2016-05-26 MED ORDER — HYDROXYPROGESTERONE CAPROATE 250 MG/ML IM OIL
250.0000 mg | TOPICAL_OIL | Freq: Once | INTRAMUSCULAR | Status: AC
  Administered 2016-05-26: 250 mg via INTRAMUSCULAR

## 2016-05-26 NOTE — Progress Notes (Signed)
Pt here for 17P. Pt tolerated shot well. Return in 1 week for next shot. JSY 

## 2016-06-02 ENCOUNTER — Ambulatory Visit (INDEPENDENT_AMBULATORY_CARE_PROVIDER_SITE_OTHER): Payer: Medicaid Other | Admitting: Obstetrics & Gynecology

## 2016-06-02 ENCOUNTER — Other Ambulatory Visit: Payer: Medicaid Other

## 2016-06-02 ENCOUNTER — Encounter: Payer: Self-pay | Admitting: Obstetrics & Gynecology

## 2016-06-02 VITALS — BP 100/60 | HR 76 | Wt 178.4 lb

## 2016-06-02 DIAGNOSIS — O09212 Supervision of pregnancy with history of pre-term labor, second trimester: Secondary | ICD-10-CM | POA: Diagnosis not present

## 2016-06-02 DIAGNOSIS — Z3492 Encounter for supervision of normal pregnancy, unspecified, second trimester: Secondary | ICD-10-CM

## 2016-06-02 DIAGNOSIS — Z3A27 27 weeks gestation of pregnancy: Secondary | ICD-10-CM

## 2016-06-02 DIAGNOSIS — Z3482 Encounter for supervision of other normal pregnancy, second trimester: Secondary | ICD-10-CM

## 2016-06-02 DIAGNOSIS — Z331 Pregnant state, incidental: Secondary | ICD-10-CM

## 2016-06-02 DIAGNOSIS — Z1389 Encounter for screening for other disorder: Secondary | ICD-10-CM

## 2016-06-02 DIAGNOSIS — Z369 Encounter for antenatal screening, unspecified: Secondary | ICD-10-CM

## 2016-06-02 DIAGNOSIS — O3432 Maternal care for cervical incompetence, second trimester: Secondary | ICD-10-CM | POA: Diagnosis not present

## 2016-06-02 LAB — POCT URINALYSIS DIPSTICK
Blood, UA: NEGATIVE
Glucose, UA: NEGATIVE
KETONES UA: NEGATIVE
LEUKOCYTES UA: NEGATIVE
NITRITE UA: NEGATIVE

## 2016-06-02 MED ORDER — HYDROXYPROGESTERONE CAPROATE 250 MG/ML IM OIL
250.0000 mg | TOPICAL_OIL | Freq: Once | INTRAMUSCULAR | Status: AC
  Administered 2016-06-02: 250 mg via INTRAMUSCULAR

## 2016-06-02 NOTE — Addendum Note (Signed)
Addended by: Richardson ChiquitoRAVIS, Aracelia Brinson M on: 06/02/2016 10:36 AM   Modules accepted: Orders

## 2016-06-02 NOTE — Progress Notes (Signed)
[id number] 2218w5d Estimated Date of Delivery: 09/03/16  Blood pressure 100/60, pulse 76, weight 178 lb 6.4 oz (80.922 kg), last menstrual period 10/31/2015.   BP weight and urine results all reviewed and noted.  Please refer to the obstetrical flow sheet for the fundal height and fetal heart rate documentation:  Patient reports good fetal movement, denies any bleeding and no rupture of membranes symptoms or regular contractions. Patient is without complaints. All questions were answered.  Orders Placed This Encounter  Procedures  . POCT urinalysis dipstick    Plan:  Continued routine obstetrical care, threw up glucola, likes twizzlers, consume 10 in 5 minutes for 50 gram load  No Follow-up on file.

## 2016-06-03 ENCOUNTER — Inpatient Hospital Stay (HOSPITAL_COMMUNITY)
Admission: AD | Admit: 2016-06-03 | Discharge: 2016-06-04 | Disposition: A | Discharge status: Home / Self Care | Payer: Medicaid Other | Source: Ambulatory Visit | Attending: Obstetrics & Gynecology | Admitting: Obstetrics & Gynecology

## 2016-06-03 ENCOUNTER — Encounter (HOSPITAL_COMMUNITY): Payer: Self-pay | Admitting: *Deleted

## 2016-06-03 DIAGNOSIS — Z3A27 27 weeks gestation of pregnancy: Secondary | ICD-10-CM | POA: Insufficient documentation

## 2016-06-03 DIAGNOSIS — O3432 Maternal care for cervical incompetence, second trimester: Secondary | ICD-10-CM | POA: Diagnosis not present

## 2016-06-03 DIAGNOSIS — O4703 False labor before 37 completed weeks of gestation, third trimester: Secondary | ICD-10-CM

## 2016-06-03 DIAGNOSIS — O4702 False labor before 37 completed weeks of gestation, second trimester: Secondary | ICD-10-CM | POA: Insufficient documentation

## 2016-06-03 DIAGNOSIS — O26893 Other specified pregnancy related conditions, third trimester: Secondary | ICD-10-CM | POA: Insufficient documentation

## 2016-06-03 DIAGNOSIS — R102 Pelvic and perineal pain: Secondary | ICD-10-CM

## 2016-06-03 DIAGNOSIS — N949 Unspecified condition associated with female genital organs and menstrual cycle: Secondary | ICD-10-CM

## 2016-06-03 LAB — CBC
HEMATOCRIT: 32.8 % — AB (ref 34.0–46.6)
Hemoglobin: 11 g/dL — ABNORMAL LOW (ref 11.1–15.9)
MCH: 29.6 pg (ref 26.6–33.0)
MCHC: 33.5 g/dL (ref 31.5–35.7)
MCV: 88 fL (ref 79–97)
Platelets: 357 10*3/uL (ref 150–379)
RBC: 3.71 x10E6/uL — AB (ref 3.77–5.28)
RDW: 13.1 % (ref 12.3–15.4)
WBC: 14.1 10*3/uL — AB (ref 3.4–10.8)

## 2016-06-03 LAB — HIV ANTIBODY (ROUTINE TESTING W REFLEX): HIV Screen 4th Generation wRfx: NONREACTIVE

## 2016-06-03 LAB — RPR: RPR Ser Ql: NONREACTIVE

## 2016-06-03 LAB — ANTIBODY SCREEN: ANTIBODY SCREEN: NEGATIVE

## 2016-06-03 NOTE — MAU Note (Signed)
Vaginal pressure for 2 hrs. Denies LOF or bleeding. Hx of 26 wk delivery

## 2016-06-04 ENCOUNTER — Inpatient Hospital Stay (HOSPITAL_COMMUNITY): Payer: Medicaid Other

## 2016-06-04 ENCOUNTER — Encounter: Payer: Self-pay | Admitting: Obstetrics & Gynecology

## 2016-06-04 DIAGNOSIS — O4702 False labor before 37 completed weeks of gestation, second trimester: Secondary | ICD-10-CM | POA: Diagnosis not present

## 2016-06-04 DIAGNOSIS — O4703 False labor before 37 completed weeks of gestation, third trimester: Secondary | ICD-10-CM | POA: Diagnosis not present

## 2016-06-04 DIAGNOSIS — Z3A27 27 weeks gestation of pregnancy: Secondary | ICD-10-CM

## 2016-06-04 LAB — URINALYSIS, ROUTINE W REFLEX MICROSCOPIC
Bilirubin Urine: NEGATIVE
GLUCOSE, UA: NEGATIVE mg/dL
Hgb urine dipstick: NEGATIVE
Ketones, ur: 15 mg/dL — AB
Nitrite: NEGATIVE
PROTEIN: NEGATIVE mg/dL
Specific Gravity, Urine: 1.01 (ref 1.005–1.030)
pH: 6.5 (ref 5.0–8.0)

## 2016-06-04 LAB — URINE MICROSCOPIC-ADD ON

## 2016-06-04 MED ORDER — NIFEDIPINE 10 MG PO CAPS: 10.0000 mg | ORAL_CAPSULE | ORAL | Status: DC | PRN

## 2016-06-04 MED ORDER — NIFEDIPINE 10 MG PO CAPS
10.0000 mg | ORAL_CAPSULE | ORAL | Status: DC | PRN
  Administered 2016-06-04 (×2): 10 mg via ORAL
  Filled 2016-06-04 (×2): qty 1

## 2016-06-04 NOTE — Discharge Instructions (Signed)
Cervical Insufficiency Cervical insufficiency is when the cervix is weak and starts to open (dilate) and thin (efface) before the pregnancy is at term and without labor starting. This is also called incompetent cervix. It can happen in the second or third trimester when the fetus starts putting pressure on the cervix. Cervical insufficiency can lead to a miscarriage, preterm premature rupture of the membranes (PPROM), or having the baby early (preterm birth).  RISK FACTORS You may be more likely to develop cervical insufficiency if:  You have a shorter cervix than normal.  Damage or injury occurred to your cervix from a past pregnancy or surgery.  You were born with a cervical defect.  You have had a procedure done on the cervix, such as cervical biopsy.  You have a history of cervical insufficiency.  You have a history of PPROM.  You have ended several past pregnancies through abortion.  You were exposed to the drug diethylstilbestrol (DES). SYMPTOMS Often times, women do not have any symptoms. Other times, women may only have mild symptoms that often start between week 14 through 20. The symptoms may last several days or weeks. These symptoms include:  Light spotting or bleeding from the vagina.  Pelvic pressure.  A change in vaginal discharge, such as discharge that changes from clear, white, or light yellow to pink or tan.  Back pain.  Abdominal pain or cramping. DIAGNOSIS Cervical insufficiency cannot be diagnosed before you become pregnant. Once you are pregnant, your health care provider will ask about your medical history and if you have had any problems in past pregnancies. Tell your health care provider about any procedures performed on your cervix or if you have a history of miscarriages or cervical insufficiency. If your health care provider thinks you are at high risk for cervical insufficiency or show signs of cervical insufficiency, he or she may:  Perform a pelvic  exam. This will check for:  The presence of the membranes (amniotic sac) coming out of the cervix.  Cervical abnormalities.  Cervical injuries.  The presence of contractions.  Perform an ultrasonography (commonly called ultrasound) to measure the length and thickness of the cervix. TREATMENT If you have been diagnosed with cervical insufficiency, your health care provider may recommend:  Limiting physical activity.  Bed rest at home or in the hospital.  Pelvic rest, which means no sexual intercourse or placing anything in the vagina.  Cerclage to sew the cervix closed and prevent it from opening too early. The stitches (sutures) are removed between weeks 36 and 38 to avoid problems during labor. Cerclage may be recommended during pregnancy if you have had a history of miscarriages or preterm births without a known cause. It may also be recommended if you have a short cervix that was identified by ultrasound or if your health care provider has found that your cervix has dilated before 24 weeks of pregnancy. Limiting physical activity and bed rest may or may not help prevent a preterm birth. WHEN SHOULD YOU SEEK IMMEDIATE MEDICAL CARE?  Seek immediate medical care if you show any symptoms of cervical insufficiency. You will need to go to the hospital to get checked immediately.   This information is not intended to replace advice given to you by your health care provider. Make sure you discuss any questions you have with your health care provider.   Document Released: 11/14/2005 Document Revised: 12/05/2014 Document Reviewed: 01/21/2013 Elsevier Interactive Patient Education 2016 [employer] Preterm Birth Preterm birth is a birth that  happens before 37 weeks of pregnancy. Most pregnancies last about 39-41 weeks. Every week in the womb is important and is beneficial to the health of the infant. Infants born before 37 weeks of pregnancy are at a higher risk for complications. Depending  on when the infant was born, he or she may be:  Late preterm. Born between 32 weeks and 37 weeks of pregnancy.  Very preterm. Born at less than 32 weeks of pregnancy.  Extremely preterm. Born at less than 25 weeks of pregnancy. The earlier a baby is born, the more likely the child will have issues related to prematurity. Complications and problems that can be seen in infants born too early include:  Problems breathing (respiratory distress syndrome).  Low birth weight.  Problems feeding.  Sleeping problems.  Yellowing of the skin (jaundice).  Infections such as pneumonia. Babies born very preterm or extremely preterm are at risk for more serious medical issues. These include:  More severe breathing issues.  Eyesight issues.  Brain development issues (intraventricular hemorrhage).  Behavioral and emotional development issues.  Growth and developmental delays.  Cerebral palsy.  Serious feeding or bowel complications (necrotizing enterocolitis). CAUSES  There are two broad categories of preterm birth.  Spontaneous preterm birth. This is a birth resulting from preterm labor (not medically induced) or preterm premature rupture of membranes (PPROM).  Indicated preterm birth. This is a birth resulting from labor being medically induced due to health, personal, or social reasons. RISK FACTORS Preterm birth may be related to certain medical conditions, lifestyle factors, or demographic factors encountered by the mother or fetus.  Medical conditions include:  Multiple gestations (twins, triplets, and so on).  Infection.  Diabetes.  Heart disease.  Kidney disease.  Cervical or uterine abnormalities.  Being underweight.  High blood pressure or preeclampsia.  Premature rupture of membranes (PROM).  Birth defects in the fetus.  Lifestyle factors include:  Poor prenatal care.  Poor nutrition or anemia.  Cigarette smoking.  Consuming alcohol.  High levels  of stress and lack of social or emotional support.  Exposure to chemical or environmental toxins.  Substance abuse.  Demographic factors include:  African-American ethnicity.  Age (younger than 55 or older than 22 years of age).  Low socioeconomic status. Women with a history of preterm labor or who become pregnant within 23 months of giving birth are also at increased risk for preterm birth. DIAGNOSIS  Your health care provider may request additional tests to diagnose underlying complications resulting from preterm birth. Tests on the infant may include:  Physical exam.  Blood tests.  Chest X-rays.  Heart-lung monitoring. TREATMENT  After birth, special care will be taken to assess any problems or complications for the infant. Supportive care will be provided for the infant. Treatment depends on what problems are present and any complications that develop. Some preterm infants are cared for in a neonatal intensive care unit. In general, care may include:  Maintaining temperature and oxygen in a clear heated box (baby isolette).  Monitoring the infant's heart rate, breathing, and level of oxygen in the blood.  Monitoring for signs of infection and, if needed, giving IV antibiotic medicine.  Inserting a feeding tube (nose, mouth) or giving IV nutrition if unable to feed.  Inserting a breathing tube (ventilation).  Respiration support (continuous positive airway pressure [CPAP] or oxygen). Treatment will change as the infant builds up strength and is able to breathe and eat on his or her own. For some infants, no special treatment  is necessary. Parents may be educated on the potential health risks of prematurity to the infant. HOME CARE INSTRUCTIONS  Understand your infant's special conditions and needs. It may be reassuring to learn about infant CPR.  Monitor your infant in the car seat until he or she grows and matures. Infant car seats can cause breathing difficulties for  preterm infants.  Keep your infant warm. Dress your infant in layers and keep him or her away from drafts, especially in cold months of the year.  Wash your hands thoroughly after going to the bathroom or changing a diaper. Late preterm infants may be more prone to infection.  Follow all your health care provider's instructions for providing support and care to your preterm infant.  Get support from organizations and groups that understand your challenges.  Follow up with your infant's health care provider as directed. Prevention There are some things you can do to help lower your risk of having a preterm infant in the future. These include:  Good prenatal care throughout the entire pregnancy. See a health care provider regularly for advice and tests.  Management of underlying medical conditions.  Proper self-care and lifestyle changes.  Proper diet and weight control.  Watching for signs of various infections. SEEK MEDICAL CARE IF:  Your infant has feeding difficulties.  Your infant has sleeping difficulties.  Your infant has breathing difficulties.  Your infant's skin starts to look yellow.  Your infant shows signs of infection, such as a stuffy nose, fever, crying, or bluish color of the skin. FOR MORE INFORMATION [employer]: www.marchofdimes.com Prematurity.org: www.prematurity.org   This information is not intended to replace advice given to you by your health care provider. Make sure you discuss any questions you have with your health care provider.   Document Released: 02/04/2004 Document Revised: 09/04/2013 Document Reviewed: 06/13/2013 Elsevier Interactive Patient Education [employer]

## 2016-06-04 NOTE — MAU Provider Note (Signed)
History     CSN: [id number]  Arrival date and time: 06/03/16 2301   First Provider Initiated Contact with Patient 06/04/16 0050      Chief Complaint  Patient presents with  . Vaginal Pain   HPI [name] is a [id number] at [redacted]w[redacted]d with history of 26wk delivery and cerclage with this pregnancy (placed prophylactically at [redacted]w[redacted]d) who presents with constant vaginal pressure since about 9 pm. She also reports of intermittent tightness/contractions while in the MAU. She has not tried any treatments and reports her cramping is unchanged since onset. Denies LOF, VB, or vaginal discharge. Endorses fetal movement. She also is on weekly 17-P injections and vaginal Prometrium.      OB History    Gravida Para Term Preterm AB TAB SAB Ectopic Multiple Living   Past Medical History  Diagnosis Date  . PCOS (polycystic ovarian syndrome)   . Depression   . Anxiety   . Cervical incompetence during pregnancy   . Hypothyroidism     Past Surgical History  Procedure Laterality Date  . Cervical cerclage    . Labial reduction    . Cervical cerclage N/A 03/09/2016    Procedure: [facility] CERCLAGE PLACEMENT;  Surgeon: Lazaro Arms, MD;  Location: [facility];  Service: Gynecology;  Laterality: N/A;    Family History  Problem Relation Age of Onset  . Arthritis Mother   . Lupus Mother   . Autoimmune disease Mother   . Cancer Maternal Grandmother     breast  . Heart disease Maternal Grandfather   . Heart disease Paternal Grandfather   . Hypertension Paternal Grandfather     Social History  Substance Use Topics  . Smoking status: Never Smoker   . Smokeless tobacco: Never Used  . Alcohol Use: No    Allergies: No Known Allergies  Prescriptions prior to admission  Medication Sig Dispense Refill Last Dose  . docusate sodium (COLACE) 250 MG capsule Take 250 mg by mouth 2 (two) times daily as needed for constipation.   Taking  . HYDROXYprogesterone Caproate (MAKENA IM) Inject 1  mL into the muscle.   Taking  . levothyroxine (SYNTHROID) 50 MCG tablet Take 1 tablet (50 mcg total) by mouth daily before breakfast. 30 tablet 3 Taking  . Prenatal Vit-Fe Fumarate-FA (PRENATAL MULTIVITAMIN) TABS tablet Take 1 tablet by mouth at bedtime.    Taking  . progesterone (PROMETRIUM) 200 MG capsule Place 200 mg vaginally at bedtime.   Taking    ROS Physical Exam   Blood pressure 117/73, pulse 81, temperature 98.4 F (36.9 C), resp. rate 18, height  (1.499 m), weight 80.559 kg (177 lb 9.6 oz), last menstrual period 10/31/2015.  Physical Exam  Constitutional: She is oriented to person, place, and time. No distress.  Cardiovascular: Normal rate and regular rhythm.  Exam reveals no gallop and no friction rub.   No murmur heard. Respiratory: Effort normal and breath sounds normal.  GI: Soft. Bowel sounds are normal.  Genitourinary: Vagina normal.  Speculum exam: cervical os normal in appearance. White thin discharge.  Cerclage in place and not visualized through the os.  Cervical exam: closed   Neurological: She is alert and oriented to person, place, and time.  Skin: Skin is warm and dry. She is not diaphoretic.    MAU Course  Procedures  MDM Will obtain US to evaluate cervical length EFM: HR 140, mod variability, at least  10x10 CTX: q5-6 min, mild   US: with cervical length 3.7cm   Assessment and Plan    Tara Mcmahon 06/04/2016, 12:51 AM   MDM: SSE with normal appearing cervix, cerclage in place.  SVE with cervix FT external os, closed internally and 20-30% effaced and firm.  Because of pt history of incompetent cervix and preterm birth, [place] ordered to measure cervical length.  Cervix 3.7 cm, which is shorter than 5 cm previously seen on US but still wnl.  Pt worried about cramping while in MAU, reporting tightening that is unchanged since onset.  Procardia 10 mg x 2 doses 20 minutes apart given. Toco with irritability/contractions mild to palpation Q 4-5  minutes prior to Procardia, irregular/rare after Procardia given.  Pt reports decreased cramping and declines cervical exam.  D/C home with preterm labor precautions. Rx for Procardia 10 mg Q 4 hours PRN. F/U as scheduled in office or return to MAU as needed for emergencies.    A: 1. Threatened preterm labor, third trimester   2. Pelvic pressure in pregnancy, antepartum, third trimester   3. Cervical cerclage suture present in second trimester   4. [redacted] weeks gestation of pregnancy     P: D/C home with PTL precautions  Follow-up Information    Follow up with [facility].   Why:  As scheduled, Return to MAU as needed for emergencies   Contact information:   [address] [place] [phone]

## 2016-06-05 LAB — CULTURE, OB URINE

## 2016-06-08 ENCOUNTER — Telehealth: Payer: Self-pay | Admitting: Obstetrics & Gynecology

## 2016-06-08 NOTE — Telephone Encounter (Signed)
Pt called stating that she had the glucose test the last time and throw up and they Dr. Despina HiddenEure told her to eat the gummy and she throw that up to. Pt states that she has a can of soda that she could drink for the test that has the amount of sugar we need to conduct the test. Please contact pt

## 2016-06-08 NOTE — Telephone Encounter (Signed)
Just relax drink plenty of fluids and diminish activity, should resolve

## 2016-06-08 NOTE — Telephone Encounter (Signed)
Pt informed per Dr. Despina HiddenEure keep her next appt and will discuss at that time what she needs to do to have the Glucose Tolerance test. Pt verbalized understanding.

## 2016-06-09 ENCOUNTER — Ambulatory Visit (INDEPENDENT_AMBULATORY_CARE_PROVIDER_SITE_OTHER): Payer: Medicaid Other | Admitting: *Deleted

## 2016-06-09 ENCOUNTER — Other Ambulatory Visit: Payer: Medicaid Other

## 2016-06-09 ENCOUNTER — Encounter: Payer: Self-pay | Admitting: *Deleted

## 2016-06-09 VITALS — BP 140/80 | HR 102 | Ht 59.0 in | Wt 179.0 lb

## 2016-06-09 DIAGNOSIS — Z1389 Encounter for screening for other disorder: Secondary | ICD-10-CM | POA: Diagnosis not present

## 2016-06-09 DIAGNOSIS — O09893 Supervision of other high risk pregnancies, third trimester: Secondary | ICD-10-CM

## 2016-06-09 DIAGNOSIS — O09213 Supervision of pregnancy with history of pre-term labor, third trimester: Secondary | ICD-10-CM

## 2016-06-09 DIAGNOSIS — Z331 Pregnant state, incidental: Secondary | ICD-10-CM

## 2016-06-09 DIAGNOSIS — Z3492 Encounter for supervision of normal pregnancy, unspecified, second trimester: Secondary | ICD-10-CM

## 2016-06-09 LAB — POCT URINALYSIS DIPSTICK
Glucose, UA: NEGATIVE
Ketones, UA: NEGATIVE
NITRITE UA: NEGATIVE
Protein, UA: NEGATIVE
RBC UA: NEGATIVE

## 2016-06-09 MED ORDER — HYDROXYPROGESTERONE CAPROATE 250 MG/ML IM OIL
250.0000 mg | TOPICAL_OIL | Freq: Once | INTRAMUSCULAR | Status: AC
  Administered 2016-06-09: 250 mg via INTRAMUSCULAR

## 2016-06-09 NOTE — Progress Notes (Signed)
Pt here for 17P. Pt tolerated shot well. Return in 1 week for next shot. JSY 

## 2016-06-09 NOTE — Patient Instructions (Signed)
Fetal Movement Counts  Patient Name: __________________________________________________ Patient Due Date: ____________________  Performing a fetal movement count is highly recommended in high-risk pregnancies, but it is good for every pregnant woman to do. Your health care provider may ask you to start counting fetal movements at 28 weeks of the pregnancy. Fetal movements often increase:  · After eating a full meal.  · After physical activity.  · After eating or drinking something sweet or cold.  · At rest.  Pay attention to when you feel the baby is most active. This will help you notice a pattern of your baby's sleep and wake cycles and what factors contribute to an increase in fetal movement. It is important to perform a fetal movement count at the same time each day when your baby is normally most active.   HOW TO COUNT FETAL MOVEMENTS  1. Find a quiet and comfortable area to sit or lie down on your left side. Lying on your left side provides the best blood and oxygen circulation to your baby.  2. Write down the day and time on a sheet of paper or in a journal.  3. Start counting kicks, flutters, swishes, rolls, or jabs in a 2-hour period. You should feel at least 10 movements within 2 hours.  4. If you do not feel 10 movements in 2 hours, wait 2-3 hours and count again. Look for a change in the pattern or not enough counts in 2 hours.  SEEK MEDICAL CARE IF:  · You feel less than 10 counts in 2 hours, tried twice.  · There is no movement in over an hour.  · The pattern is changing or taking longer each day to reach 10 counts in 2 hours.  · You feel the baby is not moving as he or she usually does.  Date: ____________ Movements: ____________ Start time: ____________ Finish time: ____________   Date: ____________ Movements: ____________ Start time: ____________ Finish time: ____________  Date: ____________ Movements: ____________ Start time: ____________ Finish time: ____________  Date: ____________ Movements:  ____________ Start time: ____________ Finish time: ____________  Date: ____________ Movements: ____________ Start time: ____________ Finish time: ____________  Date: ____________ Movements: ____________ Start time: ____________ Finish time: ____________  Date: ____________ Movements: ____________ Start time: ____________ Finish time: ____________  Date: ____________ Movements: ____________ Start time: ____________ Finish time: ____________   Date: ____________ Movements: ____________ Start time: ____________ Finish time: ____________  Date: ____________ Movements: ____________ Start time: ____________ Finish time: ____________  Date: ____________ Movements: ____________ Start time: ____________ Finish time: ____________  Date: ____________ Movements: ____________ Start time: ____________ Finish time: ____________  Date: ____________ Movements: ____________ Start time: ____________ Finish time: ____________  Date: ____________ Movements: ____________ Start time: ____________ Finish time: ____________  Date: ____________ Movements: ____________ Start time: ____________ Finish time: ____________   Date: ____________ Movements: ____________ Start time: ____________ Finish time: ____________  Date: ____________ Movements: ____________ Start time: ____________ Finish time: ____________  Date: ____________ Movements: ____________ Start time: ____________ Finish time: ____________  Date: ____________ Movements: ____________ Start time: ____________ Finish time: ____________  Date: ____________ Movements: ____________ Start time: ____________ Finish time: ____________  Date: ____________ Movements: ____________ Start time: ____________ Finish time: ____________  Date: ____________ Movements: ____________ Start time: ____________ Finish time: ____________   Date: ____________ Movements: ____________ Start time: ____________ Finish time: ____________  Date: ____________ Movements: ____________ Start time: ____________ Finish  time: ____________  Date: ____________ Movements: ____________ Start time: ____________ Finish time: ____________  Date: ____________ Movements: ____________ Start time:   ____________ Finish time: ____________  Date: ____________ Movements: ____________ Start time: ____________ Finish time: ____________  Date: ____________ Movements: ____________ Start time: ____________ Finish time: ____________  Date: ____________ Movements: ____________ Start time: ____________ Finish time: ____________   Date: ____________ Movements: ____________ Start time: ____________ Finish time: ____________  Date: ____________ Movements: ____________ Start time: ____________ Finish time: ____________  Date: ____________ Movements: ____________ Start time: ____________ Finish time: ____________  Date: ____________ Movements: ____________ Start time: ____________ Finish time: ____________  Date: ____________ Movements: ____________ Start time: ____________ Finish time: ____________  Date: ____________ Movements: ____________ Start time: ____________ Finish time: ____________  Date: ____________ Movements: ____________ Start time: ____________ Finish time: ____________   Date: ____________ Movements: ____________ Start time: ____________ Finish time: ____________  Date: ____________ Movements: ____________ Start time: ____________ Finish time: ____________  Date: ____________ Movements: ____________ Start time: ____________ Finish time: ____________  Date: ____________ Movements: ____________ Start time: ____________ Finish time: ____________  Date: ____________ Movements: ____________ Start time: ____________ Finish time: ____________  Date: ____________ Movements: ____________ Start time: ____________ Finish time: ____________  Date: ____________ Movements: ____________ Start time: ____________ Finish time: ____________   Date: ____________ Movements: ____________ Start time: ____________ Finish time: ____________  Date: ____________  Movements: ____________ Start time: ____________ Finish time: ____________  Date: ____________ Movements: ____________ Start time: ____________ Finish time: ____________  Date: ____________ Movements: ____________ Start time: ____________ Finish time: ____________  Date: ____________ Movements: ____________ Start time: ____________ Finish time: ____________  Date: ____________ Movements: ____________ Start time: ____________ Finish time: ____________  Date: ____________ Movements: ____________ Start time: ____________ Finish time: ____________   Date: ____________ Movements: ____________ Start time: ____________ Finish time: ____________  Date: ____________ Movements: ____________ Start time: ____________ Finish time: ____________  Date: ____________ Movements: ____________ Start time: ____________ Finish time: ____________  Date: ____________ Movements: ____________ Start time: ____________ Finish time: ____________  Date: ____________ Movements: ____________ Start time: ____________ Finish time: ____________  Date: ____________ Movements: ____________ Start time: ____________ Finish time: ____________     This information is not intended to replace advice given to you by your health care provider. Make sure you discuss any questions you have with your health care provider.     Document Released: 12/14/2006 Document Revised: 12/05/2014 Document Reviewed: 09/10/2012  Elsevier Interactive Patient Education ©2016 Elsevier Inc.

## 2016-06-12 ENCOUNTER — Inpatient Hospital Stay (HOSPITAL_COMMUNITY)
Admission: AD | Admit: 2016-06-12 | Discharge: 2016-06-12 | Disposition: A | Discharge status: Home / Self Care | Payer: Medicaid Other | Source: Ambulatory Visit | Attending: Obstetrics & Gynecology | Admitting: Obstetrics & Gynecology

## 2016-06-12 ENCOUNTER — Encounter (HOSPITAL_COMMUNITY): Payer: Self-pay | Admitting: Certified Nurse Midwife

## 2016-06-12 ENCOUNTER — Inpatient Hospital Stay (HOSPITAL_COMMUNITY): Payer: Medicaid Other

## 2016-06-12 DIAGNOSIS — O99283 Endocrine, nutritional and metabolic diseases complicating pregnancy, third trimester: Secondary | ICD-10-CM | POA: Insufficient documentation

## 2016-06-12 DIAGNOSIS — O99343 Other mental disorders complicating pregnancy, third trimester: Secondary | ICD-10-CM | POA: Diagnosis not present

## 2016-06-12 DIAGNOSIS — F329 Major depressive disorder, single episode, unspecified: Secondary | ICD-10-CM | POA: Diagnosis not present

## 2016-06-12 DIAGNOSIS — E039 Hypothyroidism, unspecified: Secondary | ICD-10-CM | POA: Insufficient documentation

## 2016-06-12 DIAGNOSIS — Z3493 Encounter for supervision of normal pregnancy, unspecified, third trimester: Secondary | ICD-10-CM

## 2016-06-12 DIAGNOSIS — O368190 Decreased fetal movements, unspecified trimester, not applicable or unspecified: Secondary | ICD-10-CM

## 2016-06-12 DIAGNOSIS — O368130 Decreased fetal movements, third trimester, not applicable or unspecified: Secondary | ICD-10-CM | POA: Insufficient documentation

## 2016-06-12 DIAGNOSIS — Z3A28 28 weeks gestation of pregnancy: Secondary | ICD-10-CM | POA: Diagnosis not present

## 2016-06-12 DIAGNOSIS — F419 Anxiety disorder, unspecified: Secondary | ICD-10-CM | POA: Diagnosis not present

## 2016-06-12 NOTE — MAU Note (Signed)
Noted change in movement pattern yesterday. Not as intense or as often.  Today she missed  Her "2 active phases today".

## 2016-06-12 NOTE — Discharge Instructions (Signed)
Fetal Movement Counts  Patient Name: __________________________________________________ Patient Due Date: ____________________  Performing a fetal movement count is highly recommended in high-risk pregnancies, but it is good for every pregnant woman to do. Your health care provider may ask you to start counting fetal movements at 28 weeks of the pregnancy. Fetal movements often increase:  · After eating a full meal.  · After physical activity.  · After eating or drinking something sweet or cold.  · At rest.  Pay attention to when you feel the baby is most active. This will help you notice a pattern of your baby's sleep and wake cycles and what factors contribute to an increase in fetal movement. It is important to perform a fetal movement count at the same time each day when your baby is normally most active.   HOW TO COUNT FETAL MOVEMENTS  1. Find a quiet and comfortable area to sit or lie down on your left side. Lying on your left side provides the best blood and oxygen circulation to your baby.  2. Write down the day and time on a sheet of paper or in a journal.  3. Start counting kicks, flutters, swishes, rolls, or jabs in a 2-hour period. You should feel at least 10 movements within 2 hours.  4. If you do not feel 10 movements in 2 hours, wait 2-3 hours and count again. Look for a change in the pattern or not enough counts in 2 hours.  SEEK MEDICAL CARE IF:  · You feel less than 10 counts in 2 hours, tried twice.  · There is no movement in over an hour.  · The pattern is changing or taking longer each day to reach 10 counts in 2 hours.  · You feel the baby is not moving as he or she usually does.  Date: ____________ Movements: ____________ Start time: ____________ Finish time: ____________   Date: ____________ Movements: ____________ Start time: ____________ Finish time: ____________  Date: ____________ Movements: ____________ Start time: ____________ Finish time: ____________  Date: ____________ Movements:  ____________ Start time: ____________ Finish time: ____________  Date: ____________ Movements: ____________ Start time: ____________ Finish time: ____________  Date: ____________ Movements: ____________ Start time: ____________ Finish time: ____________  Date: ____________ Movements: ____________ Start time: ____________ Finish time: ____________  Date: ____________ Movements: ____________ Start time: ____________ Finish time: ____________   Date: ____________ Movements: ____________ Start time: ____________ Finish time: ____________  Date: ____________ Movements: ____________ Start time: ____________ Finish time: ____________  Date: ____________ Movements: ____________ Start time: ____________ Finish time: ____________  Date: ____________ Movements: ____________ Start time: ____________ Finish time: ____________  Date: ____________ Movements: ____________ Start time: ____________ Finish time: ____________  Date: ____________ Movements: ____________ Start time: ____________ Finish time: ____________  Date: ____________ Movements: ____________ Start time: ____________ Finish time: ____________   Date: ____________ Movements: ____________ Start time: ____________ Finish time: ____________  Date: ____________ Movements: ____________ Start time: ____________ Finish time: ____________  Date: ____________ Movements: ____________ Start time: ____________ Finish time: ____________  Date: ____________ Movements: ____________ Start time: ____________ Finish time: ____________  Date: ____________ Movements: ____________ Start time: ____________ Finish time: ____________  Date: ____________ Movements: ____________ Start time: ____________ Finish time: ____________  Date: ____________ Movements: ____________ Start time: ____________ Finish time: ____________   Date: ____________ Movements: ____________ Start time: ____________ Finish time: ____________  Date: ____________ Movements: ____________ Start time: ____________ Finish  time: ____________  Date: ____________ Movements: ____________ Start time: ____________ Finish time: ____________  Date: ____________ Movements: ____________ Start time:   ____________ Finish time: ____________  Date: ____________ Movements: ____________ Start time: ____________ Finish time: ____________  Date: ____________ Movements: ____________ Start time: ____________ Finish time: ____________  Date: ____________ Movements: ____________ Start time: ____________ Finish time: ____________   Date: ____________ Movements: ____________ Start time: ____________ Finish time: ____________  Date: ____________ Movements: ____________ Start time: ____________ Finish time: ____________  Date: ____________ Movements: ____________ Start time: ____________ Finish time: ____________  Date: ____________ Movements: ____________ Start time: ____________ Finish time: ____________  Date: ____________ Movements: ____________ Start time: ____________ Finish time: ____________  Date: ____________ Movements: ____________ Start time: ____________ Finish time: ____________  Date: ____________ Movements: ____________ Start time: ____________ Finish time: ____________   Date: ____________ Movements: ____________ Start time: ____________ Finish time: ____________  Date: ____________ Movements: ____________ Start time: ____________ Finish time: ____________  Date: ____________ Movements: ____________ Start time: ____________ Finish time: ____________  Date: ____________ Movements: ____________ Start time: ____________ Finish time: ____________  Date: ____________ Movements: ____________ Start time: ____________ Finish time: ____________  Date: ____________ Movements: ____________ Start time: ____________ Finish time: ____________  Date: ____________ Movements: ____________ Start time: ____________ Finish time: ____________   Date: ____________ Movements: ____________ Start time: ____________ Finish time: ____________  Date: ____________  Movements: ____________ Start time: ____________ Finish time: ____________  Date: ____________ Movements: ____________ Start time: ____________ Finish time: ____________  Date: ____________ Movements: ____________ Start time: ____________ Finish time: ____________  Date: ____________ Movements: ____________ Start time: ____________ Finish time: ____________  Date: ____________ Movements: ____________ Start time: ____________ Finish time: ____________  Date: ____________ Movements: ____________ Start time: ____________ Finish time: ____________   Date: ____________ Movements: ____________ Start time: ____________ Finish time: ____________  Date: ____________ Movements: ____________ Start time: ____________ Finish time: ____________  Date: ____________ Movements: ____________ Start time: ____________ Finish time: ____________  Date: ____________ Movements: ____________ Start time: ____________ Finish time: ____________  Date: ____________ Movements: ____________ Start time: ____________ Finish time: ____________  Date: ____________ Movements: ____________ Start time: ____________ Finish time: ____________     This information is not intended to replace advice given to you by your health care provider. Make sure you discuss any questions you have with your health care provider.     Document Released: 12/14/2006 Document Revised: 12/05/2014 Document Reviewed: 09/10/2012  Elsevier Interactive Patient Education ©2016 [employer]

## 2016-06-12 NOTE — MAU Provider Note (Signed)
History   [id number] @ 28.1 wks in with decreased fetal movement. States since being put on EFM is now feeling movement.  CSN: [id number]  Arrival date & time 06/12/16  1411   None     Chief Complaint  Patient presents with  . Decreased Fetal Movement    HPI  Past Medical History  Diagnosis Date  . PCOS (polycystic ovarian syndrome)   . Depression   . Anxiety   . Cervical incompetence during pregnancy   . Hypothyroidism     Past Surgical History  Procedure Laterality Date  . Cervical cerclage    . Labial reduction    . Cervical cerclage N/A 03/09/2016    [facility];  Surgeon: Lazaro ArmsLuther H Eure, MD;  Location: [facility];  Service: Gynecology;  Laterality: N/A;    Family History  Problem Relation Age of Onset  . Arthritis Mother   . Lupus Mother   . Autoimmune disease Mother   . Cancer Maternal Grandmother     breast  . Heart disease Maternal Grandfather   . Heart disease Paternal Grandfather   . Hypertension Paternal Grandfather     Social History  Substance Use Topics  . Smoking status: Never Smoker   . Smokeless tobacco: Never Used  . Alcohol Use: No    OB History    Gravida Para Term Preterm AB TAB SAB Ectopic Multiple Living   3 1  1 1  1   1       Review of Systems  Constitutional: Negative.   HENT: Negative.   Eyes: Negative.   Respiratory: Negative.   Cardiovascular: Negative.   Gastrointestinal: Negative.   Endocrine: Negative.   Genitourinary: Negative.   Musculoskeletal: Negative.   Skin: Negative.   Allergic/Immunologic: Negative.   Neurological: Negative.   Hematological: Negative.   Psychiatric/Behavioral: Negative.     Allergies  Review of patient's allergies indicates no known allergies.  Home Medications  No current outpatient prescriptions on file.  BP 125/72 mmHg  Pulse 124  Temp(Src) 98.4 F (36.9 C) (Oral)  Resp 18  LMP 10/31/2015  Physical Exam  Constitutional: She is oriented to person, place,  and time. She appears well-developed and well-nourished.  HENT:  Head: Normocephalic.  Eyes: Pupils are equal, round, and reactive to light.  Neck: Normal range of motion.  Cardiovascular: Normal rate, regular rhythm, normal heart sounds and intact distal pulses.   Pulmonary/Chest: Effort normal and breath sounds normal.  Abdominal: Soft. Bowel sounds are normal.  Genitourinary: Vagina normal and uterus normal.  Musculoskeletal: Normal range of motion.  Neurological: She is alert and oriented to person, place, and time. She has normal reflexes.  Skin: Skin is warm and dry.  Psychiatric: She has a normal mood and affect. Her behavior is normal. Judgment and thought content normal.    MAU Course  Procedures (including critical care time)  Labs Reviewed - No data to display No results found.   1. Supervision of normal pregnancy, third trimester       MDM  FHR pattern reassuring, will d/c home with reassurring FHR pattern.

## 2016-06-14 ENCOUNTER — Encounter: Payer: Self-pay | Admitting: Obstetrics & Gynecology

## 2016-06-16 ENCOUNTER — Encounter: Payer: Self-pay | Admitting: *Deleted

## 2016-06-16 ENCOUNTER — Ambulatory Visit (INDEPENDENT_AMBULATORY_CARE_PROVIDER_SITE_OTHER): Payer: Medicaid Other | Admitting: *Deleted

## 2016-06-16 VITALS — BP 110/70 | HR 100 | Ht 59.0 in | Wt 180.5 lb

## 2016-06-16 DIAGNOSIS — Z331 Pregnant state, incidental: Secondary | ICD-10-CM

## 2016-06-16 DIAGNOSIS — O09893 Supervision of other high risk pregnancies, third trimester: Secondary | ICD-10-CM

## 2016-06-16 DIAGNOSIS — Z1389 Encounter for screening for other disorder: Secondary | ICD-10-CM

## 2016-06-16 DIAGNOSIS — Z3493 Encounter for supervision of normal pregnancy, unspecified, third trimester: Secondary | ICD-10-CM

## 2016-06-16 DIAGNOSIS — O09213 Supervision of pregnancy with history of pre-term labor, third trimester: Secondary | ICD-10-CM | POA: Diagnosis not present

## 2016-06-16 LAB — POCT URINALYSIS DIPSTICK
Blood, UA: NEGATIVE
GLUCOSE UA: NEGATIVE
KETONES UA: NEGATIVE
Nitrite, UA: NEGATIVE
PROTEIN UA: NEGATIVE

## 2016-06-16 MED ORDER — HYDROXYPROGESTERONE CAPROATE 250 MG/ML IM OIL
250.0000 mg | TOPICAL_OIL | Freq: Once | INTRAMUSCULAR | Status: AC
  Administered 2016-06-16: 250 mg via INTRAMUSCULAR

## 2016-06-16 NOTE — Progress Notes (Signed)
Pt here for 17P. Pt tolerated shot well. Return in 1 week for next shot. JSY 

## 2016-06-23 ENCOUNTER — Encounter: Payer: Medicaid Other | Admitting: Advanced Practice Midwife

## 2016-06-24 ENCOUNTER — Ambulatory Visit (INDEPENDENT_AMBULATORY_CARE_PROVIDER_SITE_OTHER): Payer: Medicaid Other | Admitting: Obstetrics & Gynecology

## 2016-06-24 VITALS — BP 142/80 | HR 90 | Wt 186.0 lb

## 2016-06-24 DIAGNOSIS — O3433 Maternal care for cervical incompetence, third trimester: Secondary | ICD-10-CM

## 2016-06-24 DIAGNOSIS — O09213 Supervision of pregnancy with history of pre-term labor, third trimester: Secondary | ICD-10-CM | POA: Diagnosis not present

## 2016-06-24 DIAGNOSIS — Z331 Pregnant state, incidental: Secondary | ICD-10-CM | POA: Diagnosis not present

## 2016-06-24 DIAGNOSIS — Z3A30 30 weeks gestation of pregnancy: Secondary | ICD-10-CM

## 2016-06-24 DIAGNOSIS — Z3493 Encounter for supervision of normal pregnancy, unspecified, third trimester: Secondary | ICD-10-CM

## 2016-06-24 DIAGNOSIS — Z1389 Encounter for screening for other disorder: Secondary | ICD-10-CM | POA: Diagnosis not present

## 2016-06-24 DIAGNOSIS — O09893 Supervision of other high risk pregnancies, third trimester: Secondary | ICD-10-CM

## 2016-06-24 DIAGNOSIS — O3432 Maternal care for cervical incompetence, second trimester: Secondary | ICD-10-CM

## 2016-06-24 DIAGNOSIS — Z8751 Personal history of pre-term labor: Secondary | ICD-10-CM

## 2016-06-24 LAB — POCT URINALYSIS DIPSTICK
GLUCOSE UA: NEGATIVE
Ketones, UA: NEGATIVE
LEUKOCYTES UA: NEGATIVE
NITRITE UA: NEGATIVE
Protein, UA: NEGATIVE
RBC UA: NEGATIVE

## 2016-06-24 MED ORDER — HYDROXYPROGESTERONE CAPROATE 250 MG/ML IM OIL
250.0000 mg | TOPICAL_OIL | Freq: Once | INTRAMUSCULAR | Status: AC
  Administered 2016-06-24: 250 mg via INTRAMUSCULAR

## 2016-06-24 NOTE — Progress Notes (Signed)
[id number] [redacted]w[redacted]d Estimated Date of Delivery: 09/03/16  Blood pressure (!) 142/80, pulse 90, weight 186 lb (84.4 kg), last menstrual period 10/31/2015.   BP weight and urine results all reviewed and noted.  Please refer to the obstetrical flow sheet for the fundal height and fetal heart rate documentation:  Patient reports good fetal movement, denies any bleeding and no rupture of membranes symptoms or regular contractions. Patient is without complaints. All questions were answered.  Orders Placed This Encounter  Procedures  . POCT urinalysis dipstick    Plan:  Continued routine obstetrical care, continue weekly 17P  Return for 1 week 17P, PN2.

## 2016-07-01 ENCOUNTER — Other Ambulatory Visit: Payer: Medicaid Other

## 2016-07-01 ENCOUNTER — Encounter: Payer: Self-pay | Admitting: *Deleted

## 2016-07-01 ENCOUNTER — Ambulatory Visit (INDEPENDENT_AMBULATORY_CARE_PROVIDER_SITE_OTHER): Payer: Medicaid Other | Admitting: *Deleted

## 2016-07-01 VITALS — BP 132/62 | HR 112 | Ht 59.0 in | Wt 185.0 lb

## 2016-07-01 DIAGNOSIS — Z331 Pregnant state, incidental: Secondary | ICD-10-CM

## 2016-07-01 DIAGNOSIS — O09213 Supervision of pregnancy with history of pre-term labor, third trimester: Secondary | ICD-10-CM

## 2016-07-01 DIAGNOSIS — Z1389 Encounter for screening for other disorder: Secondary | ICD-10-CM

## 2016-07-01 LAB — POCT URINALYSIS DIPSTICK
Glucose, UA: NEGATIVE
Ketones, UA: NEGATIVE
NITRITE UA: NEGATIVE
Protein, UA: NEGATIVE
RBC UA: NEGATIVE

## 2016-07-01 MED ORDER — HYDROXYPROGESTERONE CAPROATE 250 MG/ML IM OIL
250.0000 mg | TOPICAL_OIL | Freq: Once | INTRAMUSCULAR | Status: AC
  Administered 2016-07-01: 250 mg via INTRAMUSCULAR

## 2016-07-01 NOTE — Progress Notes (Signed)
Pt here for 17P. Pt tolerated shot well. Return in 1 week for next shot. JSY 

## 2016-07-02 LAB — GLUCOSE TOLERANCE, 2 HOURS W/ 1HR
GLUCOSE, 1 HOUR: 124 mg/dL (ref 65–179)
GLUCOSE, 2 HOUR: 124 mg/dL (ref 65–152)
Glucose, Fasting: 87 mg/dL (ref 65–91)

## 2016-07-08 ENCOUNTER — Encounter: Payer: Self-pay | Admitting: *Deleted

## 2016-07-08 ENCOUNTER — Ambulatory Visit (INDEPENDENT_AMBULATORY_CARE_PROVIDER_SITE_OTHER): Payer: Medicaid Other | Admitting: *Deleted

## 2016-07-08 VITALS — BP 136/78 | HR 88 | Ht 59.0 in | Wt 186.0 lb

## 2016-07-08 DIAGNOSIS — O09213 Supervision of pregnancy with history of pre-term labor, third trimester: Secondary | ICD-10-CM

## 2016-07-08 DIAGNOSIS — Z1389 Encounter for screening for other disorder: Secondary | ICD-10-CM

## 2016-07-08 DIAGNOSIS — Z8751 Personal history of pre-term labor: Secondary | ICD-10-CM

## 2016-07-08 DIAGNOSIS — Z331 Pregnant state, incidental: Secondary | ICD-10-CM

## 2016-07-08 LAB — POCT URINALYSIS DIPSTICK
Blood, UA: NEGATIVE
Glucose, UA: NEGATIVE
KETONES UA: NEGATIVE
LEUKOCYTES UA: NEGATIVE
Nitrite, UA: NEGATIVE

## 2016-07-08 MED ORDER — HYDROXYPROGESTERONE CAPROATE 250 MG/ML IM OIL
250.0000 mg | TOPICAL_OIL | Freq: Once | INTRAMUSCULAR | Status: AC
  Administered 2016-07-08: 250 mg via INTRAMUSCULAR

## 2016-07-08 NOTE — Progress Notes (Signed)
Hydroxyprogesterone Caproate 250 mg IM given right ventrogluteal with no complications. Pt states already has her 1 week appt for next visit.

## 2016-07-15 ENCOUNTER — Ambulatory Visit (INDEPENDENT_AMBULATORY_CARE_PROVIDER_SITE_OTHER): Payer: Medicaid Other | Admitting: Obstetrics & Gynecology

## 2016-07-15 ENCOUNTER — Encounter: Payer: Self-pay | Admitting: Obstetrics & Gynecology

## 2016-07-15 VITALS — BP 128/70 | HR 74 | Wt 188.0 lb

## 2016-07-15 DIAGNOSIS — Z331 Pregnant state, incidental: Secondary | ICD-10-CM | POA: Diagnosis not present

## 2016-07-15 DIAGNOSIS — Z3A33 33 weeks gestation of pregnancy: Secondary | ICD-10-CM | POA: Diagnosis not present

## 2016-07-15 DIAGNOSIS — O3433 Maternal care for cervical incompetence, third trimester: Secondary | ICD-10-CM

## 2016-07-15 DIAGNOSIS — Z1389 Encounter for screening for other disorder: Secondary | ICD-10-CM | POA: Diagnosis not present

## 2016-07-15 DIAGNOSIS — Z3493 Encounter for supervision of normal pregnancy, unspecified, third trimester: Secondary | ICD-10-CM

## 2016-07-15 DIAGNOSIS — O09893 Supervision of other high risk pregnancies, third trimester: Secondary | ICD-10-CM

## 2016-07-15 DIAGNOSIS — O09213 Supervision of pregnancy with history of pre-term labor, third trimester: Secondary | ICD-10-CM

## 2016-07-15 LAB — POCT URINALYSIS DIPSTICK
Glucose, UA: NEGATIVE
KETONES UA: NEGATIVE
Leukocytes, UA: NEGATIVE
Nitrite, UA: NEGATIVE
RBC UA: NEGATIVE

## 2016-07-15 MED ORDER — OMEPRAZOLE 20 MG PO CPDR: 20.0000 mg | DELAYED_RELEASE_CAPSULE | Freq: Every day | ORAL | 6 refills | Status: DC

## 2016-07-15 MED ORDER — HYDROXYPROGESTERONE CAPROATE 250 MG/ML IM OIL
250.0000 mg | TOPICAL_OIL | Freq: Once | INTRAMUSCULAR | Status: AC
  Administered 2016-07-15: 250 mg via INTRAMUSCULAR

## 2016-07-15 NOTE — Addendum Note (Signed)
Addended by: Federico FlakeNES, PEGGY A on: 07/15/2016 10:41 AM   Modules accepted: Orders

## 2016-07-15 NOTE — Progress Notes (Signed)
[id number] 822w6d Estimated Date of Delivery: 09/03/16  Blood pressure 128/70, pulse 74, weight 188 lb (85.3 kg), last menstrual period 10/31/2015.   BP weight and urine results all reviewed and noted.  Please refer to the obstetrical flow sheet for the fundal height and fetal heart rate documentation:  Patient reports good fetal movement, denies any bleeding and no rupture of membranes symptoms or regular contractions. Patient is without complaints. All questions were answered.  Orders Placed This Encounter  Procedures  . POCT urinalysis dipstick    Plan:  Continued routine obstetrical care, having heartburn, will start prilosec  Meds ordered this encounter  Medications  . omeprazole (PRILOSEC) 20 MG capsule    Sig: Take 1 capsule (20 mg total) by mouth daily. 1 tablet a day    Dispense:  30 capsule    Refill:  6     Return in about 2 weeks (around 07/29/2016).

## 2016-07-18 ENCOUNTER — Inpatient Hospital Stay (HOSPITAL_COMMUNITY)
Admission: AD | Admit: 2016-07-18 | Discharge: 2016-07-19 | DRG: 778 | Disposition: A | Discharge status: Home / Self Care | Payer: Medicaid Other | Source: Ambulatory Visit | Attending: Family Medicine | Admitting: Family Medicine

## 2016-07-18 ENCOUNTER — Telehealth: Payer: Self-pay | Admitting: Obstetrics & Gynecology

## 2016-07-18 ENCOUNTER — Encounter (HOSPITAL_COMMUNITY): Payer: Self-pay

## 2016-07-18 DIAGNOSIS — O3433 Maternal care for cervical incompetence, third trimester: Secondary | ICD-10-CM | POA: Diagnosis present

## 2016-07-18 DIAGNOSIS — Z3493 Encounter for supervision of normal pregnancy, unspecified, third trimester: Secondary | ICD-10-CM

## 2016-07-18 DIAGNOSIS — O99283 Endocrine, nutritional and metabolic diseases complicating pregnancy, third trimester: Secondary | ICD-10-CM | POA: Diagnosis present

## 2016-07-18 DIAGNOSIS — O4700 False labor before 37 completed weeks of gestation, unspecified trimester: Secondary | ICD-10-CM | POA: Diagnosis present

## 2016-07-18 DIAGNOSIS — O4703 False labor before 37 completed weeks of gestation, third trimester: Secondary | ICD-10-CM

## 2016-07-18 DIAGNOSIS — Z3A33 33 weeks gestation of pregnancy: Secondary | ICD-10-CM

## 2016-07-18 DIAGNOSIS — O479 False labor, unspecified: Secondary | ICD-10-CM | POA: Diagnosis present

## 2016-07-18 DIAGNOSIS — Z8249 Family history of ischemic heart disease and other diseases of the circulatory system: Secondary | ICD-10-CM | POA: Diagnosis not present

## 2016-07-18 DIAGNOSIS — E039 Hypothyroidism, unspecified: Secondary | ICD-10-CM | POA: Diagnosis present

## 2016-07-18 LAB — URINALYSIS, ROUTINE W REFLEX MICROSCOPIC
BILIRUBIN URINE: NEGATIVE
Glucose, UA: NEGATIVE mg/dL
Hgb urine dipstick: NEGATIVE
KETONES UR: NEGATIVE mg/dL
NITRITE: NEGATIVE
PROTEIN: NEGATIVE mg/dL
Specific Gravity, Urine: 1.01 (ref 1.005–1.030)
pH: 6 (ref 5.0–8.0)

## 2016-07-18 LAB — URINE MICROSCOPIC-ADD ON
Bacteria, UA: NONE SEEN
RBC / HPF: NONE SEEN RBC/hpf (ref 0–5)

## 2016-07-18 MED ORDER — CALCIUM CARBONATE ANTACID 500 MG PO CHEW: 2.0000 | CHEWABLE_TABLET | ORAL | Status: DC | PRN

## 2016-07-18 MED ORDER — BETAMETHASONE SOD PHOS & ACET 6 (3-3) MG/ML IJ SUSP
12.0000 mg | Freq: Once | INTRAMUSCULAR | Status: AC
  Administered 2016-07-18: 12 mg via INTRAMUSCULAR
  Filled 2016-07-18: qty 2

## 2016-07-18 MED ORDER — MAGNESIUM SULFATE BOLUS VIA INFUSION
4.0000 g | Freq: Once | INTRAVENOUS | Status: AC
  Administered 2016-07-18: 4 g via INTRAVENOUS
  Filled 2016-07-18: qty 500

## 2016-07-18 MED ORDER — PROGESTERONE MICRONIZED 200 MG PO CAPS
200.0000 mg | ORAL_CAPSULE | Freq: Every day | ORAL | Status: DC
  Administered 2016-07-18: 200 mg via VAGINAL
  Filled 2016-07-18: qty 1

## 2016-07-18 MED ORDER — DOCUSATE SODIUM 100 MG PO CAPS
100.0000 mg | ORAL_CAPSULE | Freq: Every day | ORAL | Status: DC
  Administered 2016-07-19: 100 mg via ORAL
  Filled 2016-07-18 (×2): qty 1

## 2016-07-18 MED ORDER — NIFEDIPINE 10 MG PO CAPS
10.0000 mg | ORAL_CAPSULE | Freq: Once | ORAL | Status: AC
  Administered 2016-07-18: 10 mg via ORAL
  Filled 2016-07-18: qty 1

## 2016-07-18 MED ORDER — ACETAMINOPHEN 325 MG PO TABS: 650.0000 mg | ORAL_TABLET | ORAL | Status: DC | PRN

## 2016-07-18 MED ORDER — LACTATED RINGERS IV SOLN: INTRAVENOUS | Status: DC

## 2016-07-18 MED ORDER — ZOLPIDEM TARTRATE 5 MG PO TABS: 5.0000 mg | ORAL_TABLET | Freq: Every evening | ORAL | Status: DC | PRN

## 2016-07-18 MED ORDER — NIFEDIPINE 10 MG PO CAPS
20.0000 mg | ORAL_CAPSULE | Freq: Once | ORAL | Status: AC
  Administered 2016-07-18: 20 mg via ORAL
  Filled 2016-07-18: qty 2

## 2016-07-18 MED ORDER — LACTATED RINGERS IV SOLN
INTRAVENOUS | Status: DC
  Administered 2016-07-18 – 2016-07-19 (×2): via INTRAVENOUS

## 2016-07-18 MED ORDER — HYDROXYPROGESTERONE CAPROATE 250 MG/ML IM OIL: 250.0000 mg | TOPICAL_OIL | INTRAMUSCULAR | Status: DC

## 2016-07-18 MED ORDER — LACTATED RINGERS IV BOLUS (SEPSIS)
1000.0000 mL | Freq: Once | INTRAVENOUS | Status: AC
  Administered 2016-07-18: 1000 mL via INTRAVENOUS

## 2016-07-18 MED ORDER — MAGNESIUM SULFATE 50 % IJ SOLN
2.0000 g/h | INTRAMUSCULAR | Status: AC
  Administered 2016-07-18: 2 g/h via INTRAVENOUS
  Filled 2016-07-18: qty 80

## 2016-07-18 MED ORDER — PRENATAL MULTIVITAMIN CH
1.0000 | ORAL_TABLET | Freq: Every day | ORAL | Status: DC
  Administered 2016-07-19: 1 via ORAL
  Filled 2016-07-18 (×2): qty 1

## 2016-07-18 MED ORDER — ACETAMINOPHEN 500 MG PO TABS
1000.0000 mg | ORAL_TABLET | Freq: Once | ORAL | Status: AC
  Administered 2016-07-18: 1000 mg via ORAL
  Filled 2016-07-18: qty 2

## 2016-07-18 NOTE — Telephone Encounter (Signed)
Pt states having painful contractions 7-10 minutes apart x 1 hour, history of preterm labor and pt has a cerclage. [facility]. Pt verbalized understanding.

## 2016-07-18 NOTE — MAU Provider Note (Signed)
History     CSN: [id number]  Arrival date and time: 07/18/16 1723   First Provider Initiated Contact with Patient 07/18/16 1822      Chief Complaint  Patient presents with  . Contractions   HPI   Ms.Tara Mcmahon is a 20 y.o. female [phone] @ 2663w2d with cerclage in place,  with a history of preterm labor/delivery @ 26 weeks with her last pregnancy. She is here today with contractions that are now every 3 mins. The contractions started at 3 pm and were 7-9 mins apart. She is taking procardia every 4 hours at needed, last dose was around 3PM.   She currently rates her pain 5/10 "its not bad pain, it just hurts to talk".  Denies vaginal bleeding No recent intercourse, she is using vaginal progesterone and 17P.   OB History    Gravida Para Term Preterm AB Living   3 1   1 1 1    SAB TAB Ectopic Multiple Live Births   1       1      Past Medical History:  Diagnosis Date  . Anxiety   . Cervical incompetence during pregnancy   . Depression   . Hypothyroidism   . PCOS (polycystic ovarian syndrome)     Past Surgical History:  Procedure Laterality Date  . CERVICAL CERCLAGE    . CERVICAL CERCLAGE N/A 03/09/2016   [facility];  Surgeon: Lazaro ArmsLuther H Eure, MD;  Location: [facility];  Service: Gynecology;  Laterality: N/A;  . labial reduction      Family History  Problem Relation Age of Onset  . Arthritis Mother   . Lupus Mother   . Autoimmune disease Mother   . Cancer Maternal Grandmother     breast  . Heart disease Maternal Grandfather   . Heart disease Paternal Grandfather   . Hypertension Paternal Grandfather     Social History  Substance Use Topics  . Smoking status: Never Smoker  . Smokeless tobacco: Never Used  . Alcohol use No    Allergies: No Known Allergies  Prescriptions Prior to Admission  Medication Sig Dispense Refill Last Dose  . docusate sodium (COLACE) 250 MG capsule Take 250 mg by mouth 2 (two) times daily as needed for  constipation.   Taking  . hydroxyprogesterone caproate (MAKENA) 250 mg/mL OIL injection Inject 250 mg into the muscle once a week.   Taking  . levothyroxine (SYNTHROID) 50 MCG tablet Take 1 tablet (50 mcg total) by mouth daily before breakfast. 30 tablet 3 Taking  . NIFEdipine (PROCARDIA) 10 MG capsule Take 10 mg by mouth every 4 (four) hours as needed (for contractions).   Taking  . omeprazole (PRILOSEC) 20 MG capsule Take 1 capsule (20 mg total) by mouth daily. 1 tablet a day 30 capsule 6   . Prenatal Vit-Fe Fumarate-FA (PRENATAL MULTIVITAMIN) TABS tablet Take 1 tablet by mouth at bedtime.    Taking  . progesterone (PROMETRIUM) 200 MG capsule Place 200 mg vaginally at bedtime.   Taking   Results for orders placed or performed during the hospital encounter of 07/18/16 (from the past 48 hour(s))  Urinalysis, Routine w reflex microscopic ([facility])     Status: Abnormal   Collection Time: 07/18/16  5:45 PM  Result Value Ref Range   Color, Urine YELLOW YELLOW   APPearance HAZY (A) CLEAR   Specific Gravity, Urine 1.010 1.005 - 1.030   pH 6.0 5.0 - 8.0   Glucose, UA NEGATIVE NEGATIVE  mg/dL   Hgb urine dipstick NEGATIVE NEGATIVE   Bilirubin Urine NEGATIVE NEGATIVE   Ketones, ur NEGATIVE NEGATIVE mg/dL   Protein, ur NEGATIVE NEGATIVE mg/dL   Nitrite NEGATIVE NEGATIVE   Leukocytes, UA SMALL (A) NEGATIVE  Urine microscopic-add on     Status: Abnormal   Collection Time: 07/18/16  5:45 PM  Result Value Ref Range   Squamous Epithelial / LPF 0-5 (A) NONE SEEN   WBC, UA 0-5 0 - 5 WBC/hpf   RBC / HPF NONE SEEN 0 - 5 RBC/hpf   Bacteria, UA NONE SEEN NONE SEEN    Review of Systems  Constitutional: Negative for chills and fever.  Gastrointestinal: Positive for nausea. Negative for vomiting.  Genitourinary: Negative for dysuria.   Physical Exam   Blood pressure 135/83, pulse (!) 135, temperature 98.1 F (36.7 C), temperature source Oral, resp. rate 18, height 4\' 11"  (1.499 m), weight 188 lb  (85.3 kg), last menstrual period 10/31/2015.  Patient Vitals for the past 24 hrs:  BP Temp Temp src Pulse Resp SpO2 Height Weight  07/18/16 1919 - - - (!) 148 - 99 % - -  07/18/16 1914 - - - (!) 146 - 99 % - -  07/18/16 1909 - - - (!) 131 - 100 % - -  07/18/16 1904 - - - 118 - 100 % - -  07/18/16 1900 - - - 119 - 100 % - -  07/18/16 1854 - - - 118 - 99 % - -  07/18/16 1849 - - - 119 - 100 % - -  07/18/16 1844 - - - 113 - 99 % - -  07/18/16 1839 - - - (!) 125 - 100 % - -  07/18/16 1834 - - - (!) 138 - 97 % - -  07/18/16 1814 135/83 - - (!) 135 - - 4\' 11"  (1.499 m) 188 lb (85.3 kg)  07/18/16 1810 135/83 - - (!) 135 - - - -  07/18/16 1740 122/78 98.1 F (36.7 C) Oral 119 18 - - -    Physical Exam  Constitutional: She appears well-developed and well-nourished. No distress.  HENT:  Head: Normocephalic.  Eyes: Pupils are equal, round, and reactive to light.  Respiratory: Effort normal.  Genitourinary:  Genitourinary Comments: Speculum exam: Vagina - Small amount of creamy discharge, no odor Cervix - No contact bleeding, cervix appears closed. Cerclage noted. Bimanual exam: Cervix closed, thick posterior, cerclage palpated  Chaperone present for exam.  Skin: She is not diaphoretic.  Psychiatric: Her mood appears anxious.   Fetal Tracing: Baseline: 145 bpm  Variability: Moderate  Accelerations: 15x15 Decelerations: None Toco: Q2-4 mins   MAU Course  Procedures  None  MDM  Procardia 10 mg PO X 2 LR bolus  EKG: patient's HR remains in the 130's-140's Report given to Thressa ShellerHeather Ronald Londo CNM who resumes care of the patient  Patient feels better, however reports HA. Tylenol 1 gram given  Duane LopeJennifer I Rasch, NP 07/18/2016 8:23 PM  2125: Recheck cervix: closed and thick. Contractions q2-3 mins despite, fluids/procardia. Will give one more dose of procardia and start BMZ series.   2209:Contractions have stopped at this time. D/W Dr. Adrian BlackwaterStinson, will dc home. Patient to return in 24  hours for repeat BMZ.   2249: Contractions have returned. Dr. Adrian BlackwaterStinson notified. Will admit to ante for magnesium.  Assessment and Plan   1. Preterm contractions, third trimester   2. Supervision of normal pregnancy, third trimester    Admit to ante  Magnesium BMZ

## 2016-07-18 NOTE — Telephone Encounter (Signed)
Pt called stating that she is having some contraction and would like to speak with a nurse. Please contact pt

## 2016-07-18 NOTE — MAU Note (Signed)
Pt took Procardia at 1525.

## 2016-07-18 NOTE — MAU Note (Signed)
Pt states she has been having uc's since 1500, becoming closer together, painful.  Denies bleeding or LOF.  Pt has cerclage.

## 2016-07-18 NOTE — MAU Note (Addendum)
Contractions that started at 3pm, were 9 minutes apart.  Have gradually gotten closer together.  Now states they are 2-3 minutes apart.

## 2016-07-19 ENCOUNTER — Encounter (HOSPITAL_COMMUNITY): Payer: Self-pay | Admitting: *Deleted

## 2016-07-19 DIAGNOSIS — O4703 False labor before 37 completed weeks of gestation, third trimester: Secondary | ICD-10-CM | POA: Diagnosis not present

## 2016-07-19 LAB — CBC
HCT: 30.9 % — ABNORMAL LOW (ref 36.0–46.0)
HEMOGLOBIN: 11 g/dL — AB (ref 12.0–15.0)
MCH: 29.6 pg (ref 26.0–34.0)
MCHC: 35.6 g/dL (ref 30.0–36.0)
MCV: 83.1 fL (ref 78.0–100.0)
Platelets: 337 10*3/uL (ref 150–400)
RBC: 3.72 MIL/uL — AB (ref 3.87–5.11)
RDW: 13 % (ref 11.5–15.5)
WBC: 19.6 10*3/uL — ABNORMAL HIGH (ref 4.0–10.5)

## 2016-07-19 LAB — TYPE AND SCREEN
ABO/RH(D): O POS
Antibody Screen: NEGATIVE

## 2016-07-19 LAB — ABO/RH: ABO/RH(D): O POS

## 2016-07-19 MED ORDER — NIFEDIPINE ER 30 MG PO TB24: 30.0000 mg | ORAL_TABLET | Freq: Two times a day (BID) | ORAL | 0 refills | Status: DC

## 2016-07-19 MED ORDER — BETAMETHASONE SOD PHOS & ACET 6 (3-3) MG/ML IJ SUSP
12.0000 mg | Freq: Once | INTRAMUSCULAR | Status: AC
  Administered 2016-07-19: 12 mg via INTRAMUSCULAR
  Filled 2016-07-19: qty 2

## 2016-07-19 MED ORDER — NIFEDIPINE ER OSMOTIC RELEASE 30 MG PO TB24: 30.0000 mg | ORAL_TABLET | Freq: Every day | ORAL | Status: DC

## 2016-07-19 MED ORDER — NIFEDIPINE ER OSMOTIC RELEASE 30 MG PO TB24
30.0000 mg | ORAL_TABLET | Freq: Two times a day (BID) | ORAL | Status: DC
  Administered 2016-07-19 (×2): 30 mg via ORAL
  Filled 2016-07-19 (×2): qty 1

## 2016-07-19 NOTE — H&P (Signed)
History     CSN: [id number]  Arrival date and time: 07/18/16 1723   First Provider Initiated Contact with Patient 07/18/16 1822      Chief Complaint  Patient presents with  . Contractions   HPI   Ms.Tara Mcmahon is a 20 y.o. female [phone] @ 2663w2d with cerclage in place,  with a history of preterm labor/delivery @ 26 weeks with her last pregnancy. She is here today with contractions that are now every 3 mins. The contractions started at 3 pm and were 7-9 mins apart. She is taking procardia every 4 hours at needed, last dose was around 3PM.   She currently rates her pain 5/10 "its not bad pain, it just hurts to talk".  Denies vaginal bleeding No recent intercourse, she is using vaginal progesterone and 17P.   OB History    Gravida Para Term Preterm AB Living   3 1   1 1 1    SAB TAB Ectopic Multiple Live Births   1       1      Past Medical History:  Diagnosis Date  . Anxiety   . Cervical incompetence during pregnancy   . Depression   . Hypothyroidism   . PCOS (polycystic ovarian syndrome)     Past Surgical History:  Procedure Laterality Date  . CERVICAL CERCLAGE    . CERVICAL CERCLAGE N/A 03/09/2016   [facility];  Surgeon: Lazaro ArmsLuther H Eure, MD;  Location: [facility];  Service: Gynecology;  Laterality: N/A;  . labial reduction      Family History  Problem Relation Age of Onset  . Arthritis Mother   . Lupus Mother   . Autoimmune disease Mother   . Cancer Maternal Grandmother     breast  . Heart disease Maternal Grandfather   . Heart disease Paternal Grandfather   . Hypertension Paternal Grandfather     Social History  Substance Use Topics  . Smoking status: Never Smoker  . Smokeless tobacco: Never Used  . Alcohol use No    Allergies: No Known Allergies  Prescriptions Prior to Admission  Medication Sig Dispense Refill Last Dose  . docusate sodium (COLACE) 250 MG capsule Take 250 mg by mouth 2 (two) times daily as needed for  constipation.   Taking  . hydroxyprogesterone caproate (MAKENA) 250 mg/mL OIL injection Inject 250 mg into the muscle once a week.   Taking  . levothyroxine (SYNTHROID) 50 MCG tablet Take 1 tablet (50 mcg total) by mouth daily before breakfast. 30 tablet 3 Taking  . NIFEdipine (PROCARDIA) 10 MG capsule Take 10 mg by mouth every 4 (four) hours as needed (for contractions).   Taking  . omeprazole (PRILOSEC) 20 MG capsule Take 1 capsule (20 mg total) by mouth daily. 1 tablet a day 30 capsule 6   . Prenatal Vit-Fe Fumarate-FA (PRENATAL MULTIVITAMIN) TABS tablet Take 1 tablet by mouth at bedtime.    Taking  . progesterone (PROMETRIUM) 200 MG capsule Place 200 mg vaginally at bedtime.   Taking   Results for orders placed or performed during the hospital encounter of 07/18/16 (from the past 48 hour(s))  Urinalysis, Routine w reflex microscopic (not [facility])     Status: Abnormal   Collection Time: 07/18/16  5:45 PM  Result Value Ref Range   Color, Urine YELLOW YELLOW   APPearance HAZY (A) CLEAR   Specific Gravity, Urine 1.010 1.005 - 1.030   pH 6.0 5.0 - 8.0   Glucose, UA NEGATIVE NEGATIVE  mg/dL   Hgb urine dipstick NEGATIVE NEGATIVE   Bilirubin Urine NEGATIVE NEGATIVE   Ketones, ur NEGATIVE NEGATIVE mg/dL   Protein, ur NEGATIVE NEGATIVE mg/dL   Nitrite NEGATIVE NEGATIVE   Leukocytes, UA SMALL (A) NEGATIVE  Urine microscopic-add on     Status: Abnormal   Collection Time: 07/18/16  5:45 PM  Result Value Ref Range   Squamous Epithelial / LPF 0-5 (A) NONE SEEN   WBC, UA 0-5 0 - 5 WBC/hpf   RBC / HPF NONE SEEN 0 - 5 RBC/hpf   Bacteria, UA NONE SEEN NONE SEEN    Review of Systems  Constitutional: Negative for chills and fever.  Gastrointestinal: Positive for nausea. Negative for vomiting.  Genitourinary: Negative for dysuria.   Physical Exam   Blood pressure 135/83, pulse (!) 135, temperature 98.1 F (36.7 C), temperature source Oral, resp. rate 18, height 4\' 11"  (1.499 m), weight 188 lb  (85.3 kg), last menstrual period 10/31/2015.  Patient Vitals for the past 24 hrs:  BP Temp Temp src Pulse Resp SpO2 Height Weight  07/18/16 1919 - - - (!) 148 - 99 % - -  07/18/16 1914 - - - (!) 146 - 99 % - -  07/18/16 1909 - - - (!) 131 - 100 % - -  07/18/16 1904 - - - 118 - 100 % - -  07/18/16 1900 - - - 119 - 100 % - -  07/18/16 1854 - - - 118 - 99 % - -  07/18/16 1849 - - - 119 - 100 % - -  07/18/16 1844 - - - 113 - 99 % - -  07/18/16 1839 - - - (!) 125 - 100 % - -  07/18/16 1834 - - - (!) 138 - 97 % - -  07/18/16 1814 135/83 - - (!) 135 - - 4\' 11"  (1.499 m) 188 lb (85.3 kg)  07/18/16 1810 135/83 - - (!) 135 - - - -  07/18/16 1740 122/78 98.1 F (36.7 C) Oral 119 18 - - -    Physical Exam  Constitutional: She appears well-developed and well-nourished. No distress.  HENT:  Head: Normocephalic.  Eyes: Pupils are equal, round, and reactive to light.  Respiratory: Effort normal.  Genitourinary:  Genitourinary Comments: Speculum exam: Vagina - Small amount of creamy discharge, no odor Cervix - No contact bleeding, cervix appears closed. Cerclage noted. Bimanual exam: Cervix closed, thick posterior, cerclage palpated  Chaperone present for exam.  Skin: She is not diaphoretic.  Psychiatric: Her mood appears anxious.   Fetal Tracing: Baseline: 145 bpm  Variability: Moderate  Accelerations: 15x15 Decelerations: None Toco: Q2-4 mins   MAU Course  Procedures  None  MDM  Procardia 10 mg PO X 2 LR bolus  EKG: patient's HR remains in the 130's-140's Report given to Thressa ShellerHeather Hogan CNM who resumes care of the patient  Patient feels better, however reports HA. Tylenol 1 gram given  Duane LopeJennifer I Rasch, NP 07/18/2016 8:23 PM  2125: Recheck cervix: closed and thick. Contractions q2-3 mins despite, fluids/procardia. Will give one more dose of procardia and start BMZ series.   2209:Contractions have stopped at this time. D/W Dr. Adrian BlackwaterStinson, will dc home. Patient to return in 24  hours for repeat BMZ.   2249: Contractions have returned. Dr. Adrian BlackwaterStinson notified. Will admit to ante for magnesium.  Assessment and Plan   1. Preterm contractions, third trimester   2. Supervision of normal pregnancy, third trimester    Admit to ante  Magnesium BMZ    Attestation of Attending Supervision of Advanced Practitioner (PA/CNM/NP): Evaluation and management procedures were performed by the Advanced Practitioner under my supervision and collaboration.  I have reviewed the Advanced Practitioner's note and chart, and I agree with the management and plan.  Candelaria CelesteJacob Harrold Fitchett, DO Attending Physician Faculty Practice, [facility]

## 2016-07-19 NOTE — Discharge Instructions (Signed)

## 2016-07-19 NOTE — Progress Notes (Signed)
Patient ID: Tara Mcmahon, female   DOB: 09-16-1996, 20 y.o.   MRN: [id number]  FACULTY PRACTICE ANTEPARTUM NOTE  Tara Mcmahon is a 20 y.o. [id number] at 5271w3d  who is admitted for preterm contractions.   Fetal presentation is cephalic. Length of Stay:  1  Days  Subjective: No contractions since 3am.  On mag.  No abdnormal discharge.   Patient reports good fetal movement.   She reports no uterine contractions She reports no bleeding  She reports no loss of fluid per vagina.  Vitals:  Blood pressure (!) 108/53, pulse (!) 119, temperature 98.2 F (36.8 C), temperature source Oral, resp. rate 18, height 4\' 11"  (1.499 m), weight 188 lb (85.3 kg), last menstrual period 10/31/2015, SpO2 100 %. Physical Examination:  General appearance - alert, well appearing, and in no distress Abdomen - soft, nontender, nondistended, no masses or organomegaly Fundal Height:  size equals dates Extremities: extremities normal, atraumatic, no cyanosis or edema  Membranes:intact  Fetal Monitoring:  Baseline: 130s bpm, Variability: Good {> 6 bpm), Accelerations: Reactive and Decelerations: Absent  Labs:  Results for orders placed or performed during the hospital encounter of 07/18/16 (from the past 24 hour(s))  Urinalysis, Routine w reflex microscopic (not at [facility])   Collection Time: 07/18/16  5:45 PM  Result Value Ref Range   Color, Urine YELLOW YELLOW   APPearance HAZY (A) CLEAR   Specific Gravity, Urine 1.010 1.005 - 1.030   pH 6.0 5.0 - 8.0   Glucose, UA NEGATIVE NEGATIVE mg/dL   Hgb urine dipstick NEGATIVE NEGATIVE   Bilirubin Urine NEGATIVE NEGATIVE   Ketones, ur NEGATIVE NEGATIVE mg/dL   Protein, ur NEGATIVE NEGATIVE mg/dL   Nitrite NEGATIVE NEGATIVE   Leukocytes, UA SMALL (A) NEGATIVE  Urine microscopic-add on   Collection Time: 07/18/16  5:45 PM  Result Value Ref Range   Squamous Epithelial / LPF 0-5 (A) NONE SEEN   WBC, UA 0-5 0 - 5 WBC/hpf   RBC / HPF NONE SEEN 0 - 5 RBC/hpf   Bacteria, UA NONE SEEN NONE SEEN  CBC   Collection Time: 07/19/16 12:17 AM  Result Value Ref Range   WBC 19.6 (H) 4.0 - 10.5 K/uL   RBC 3.72 (L) 3.87 - 5.11 MIL/uL   Hemoglobin 11.0 (L) 12.0 - 15.0 g/dL   HCT 29.530.9 (L) 62.136.0 - 30.846.0 %   MCV 83.1 78.0 - 100.0 fL   MCH 29.6 26.0 - 34.0 pg   MCHC 35.6 30.0 - 36.0 g/dL   RDW 65.713.0 84.611.5 - 96.215.5 %   Platelets 337 150 - 400 K/uL  Type and screen [facility]   Collection Time: 07/19/16 12:17 AM  Result Value Ref Range   ABO/RH(D) O POS    Antibody Screen NEG    Sample Expiration 07/22/2016   ABO/Rh   Collection Time: 07/19/16 12:17 AM  Result Value Ref Range   ABO/RH(D) O POS     Imaging Studies:       Medications:  Scheduled . betamethasone acetate-betamethasone sodium phosphate  12 mg Intramuscular Once  . docusate sodium  100 mg Oral Daily  . [START ON 07/22/2016] hydroxyprogesterone caproate  250 mg Intramuscular Weekly  . prenatal multivitamin  1 tablet Oral Q1200  . progesterone  200 mg Vaginal QHS   I have reviewed the patient's current medications.  ASSESSMENT: Patient Active Problem List   Diagnosis Date Noted  . Preterm contractions 07/18/2016  . UTI (urinary tract infection) during pregnancy 03/30/2016  .  Subclinical hypothyroidism 02/04/2016  . History of suicidal ideation 02/03/2016  . Supervision of normal pregnancy 01/27/2016  . History of preterm delivery, currently pregnant 01/27/2016  . PCOS (polycystic ovarian syndrome) 01/27/2016  . Depression with anxiety 01/27/2016  . Family history of systemic lupus erythematosus (SLE) in mother 01/27/2016    PLAN: 1.  Preterm Contractions  Stop mag at 1000  Give procardia XR 30mg  bid starting at 0900.  Second dose of betamethasone tonight.  Progesterone 200mg  vaginally  Possibly home later tonight. Continue routine antenatal care.   Levie HeritageJacob J Maurion Walkowiak, DO 07/19/2016,6:36 AM

## 2016-07-19 NOTE — Discharge Summary (Signed)
Antenatal Physician Discharge Summary  Patient ID: Tara Mcmahon MRN: [id number] DOB/AGE: November 26, 1996 20 y.o.  Admit date: 07/18/2016 Discharge date: 07/19/2016  Admission Diagnoses:Active Problems:   Preterm contractions    Discharge Diagnoses: Same  Prenatal Procedures: NST  Consults: Neonatology, Maternal Fetal Medicine  Hospital Course:  This is a 20 y.o. [id number] with IUP at 259w3d admitted for contractions. She was admitted with contractions, noted to have a cervical exam of closed but cerclage in place.  No leaking of fluid and no bleeding.  She was initially started on magnesium sulfate for tocolysis and neuroprotection and also received betamethasone x 2 doses.  Her tocolysis was transitioned to Procardia. She was observed, fetal heart rate monitoring remained reassuring, and she had no signs/symptoms of progressing preterm labor or other maternal-fetal concerns.  Her cervical exam was unchanged from admission.  She was deemed stable for discharge to home with outpatient follow up.  Discharge Exam: Temp:  [98.1 F (36.7 C)-98.7 F (37.1 C)] 98.7 F (37.1 C) (08/22 1226) Pulse Rate:  [113-148] 115 (08/22 1226) Resp:  [16-18] 16 (08/22 1226) BP: (108-135)/(53-83) 128/76 (08/22 1226) SpO2:  [97 %-100 %] 100 % (08/21 2119) Weight:  [188 lb (85.3 kg)] 188 lb (85.3 kg) (08/21 1814) Physical Examination: CONSTITUTIONAL: Well-developed, well-nourished female in no acute distress.  HENT:  Normocephalic, atraumatic, External right and left ear normal. Oropharynx is clear and moist NECK: Normal range of motion, supple, no masses judgment and thought content. CARDIOVASCULAR: Normal heart rate noted, regular rhythm RESPIRATORY: Effort and breath sounds normal, no problems with respiration noted MUSCULOSKELETAL: Normal range of motion. No edema and no tenderness. 2+ distal pulses. ABDOMEN: Soft, nontender, nondistended, gravid. CERVIX: Dilation: Closed Effacement (%): Thick Exam  by:: Thressa ShellerHeather Hogan NP  Fetal monitoring: FHR: 145 bpm, Variability: moderate, Accelerations: Present, Decelerations: Absent  Uterine activity: 0 contractions per hour  Significant Diagnostic Studies:  Results for orders placed or performed during the hospital encounter of 07/18/16 (from the past 168 hour(s))  Urinalysis, Routine w reflex microscopic (not at [facility])   Collection Time: 07/18/16  5:45 PM  Result Value Ref Range   Color, Urine YELLOW YELLOW   APPearance HAZY (A) CLEAR   Specific Gravity, Urine 1.010 1.005 - 1.030   pH 6.0 5.0 - 8.0   Glucose, UA NEGATIVE NEGATIVE mg/dL   Hgb urine dipstick NEGATIVE NEGATIVE   Bilirubin Urine NEGATIVE NEGATIVE   Ketones, ur NEGATIVE NEGATIVE mg/dL   Protein, ur NEGATIVE NEGATIVE mg/dL   Nitrite NEGATIVE NEGATIVE   Leukocytes, UA SMALL (A) NEGATIVE  Urine microscopic-add on   Collection Time: 07/18/16  5:45 PM  Result Value Ref Range   Squamous Epithelial / LPF 0-5 (A) NONE SEEN   WBC, UA 0-5 0 - 5 WBC/hpf   RBC / HPF NONE SEEN 0 - 5 RBC/hpf   Bacteria, UA NONE SEEN NONE SEEN  CBC   Collection Time: 07/19/16 12:17 AM  Result Value Ref Range   WBC 19.6 (H) 4.0 - 10.5 K/uL   RBC 3.72 (L) 3.87 - 5.11 MIL/uL   Hemoglobin 11.0 (L) 12.0 - 15.0 g/dL   HCT 14.730.9 (L) 82.936.0 - 56.246.0 %   MCV 83.1 78.0 - 100.0 fL   MCH 29.6 26.0 - 34.0 pg   MCHC 35.6 30.0 - 36.0 g/dL   RDW 13.013.0 86.511.5 - 78.415.5 %   Platelets 337 150 - 400 K/uL  Type and screen [facility]   Collection Time: 07/19/16 12:17 AM  Result Value  Ref Range   ABO/RH(D) O POS    Antibody Screen NEG    Sample Expiration 07/22/2016   ABO/Rh   Collection Time: 07/19/16 12:17 AM  Result Value Ref Range   ABO/RH(D) O POS   Results for orders placed or performed in visit on 07/15/16 (from the past 168 hour(s))  POCT urinalysis dipstick   Collection Time: 07/15/16 10:09 AM  Result Value Ref Range   Color, UA     Clarity, UA     Glucose, UA neg    Bilirubin, UA      Ketones, UA neg    Spec Grav, UA     Blood, UA neg    pH, UA     Protein, UA trace    Urobilinogen, UA     Nitrite, UA neg    Leukocytes, UA Negative Negative    Discharge Condition: Stable  Disposition: 01-Home or Self Care   Discharge Instructions    Discharge activity:  Up to eat    Complete by:  As directed   Discharge diet:  No restrictions    Complete by:  As directed   Do not have sex or do anything that might make you have an orgasm    Complete by:  As directed   Fetal Kick Count:  Lie on our left side for one hour after a meal, and count the number of times your baby kicks.  If it is less than 5 times, get up, move around and drink some juice.  Repeat the test 30 minutes later.  If it is still less than 5 kicks in an hour, notify your doctor.    Complete by:  As directed   Notify physician for a general feeling that "something is not right"    Complete by:  As directed   Notify physician for increase or change in vaginal discharge    Complete by:  As directed   Notify physician for intestinal cramps, with or without diarrhea, sometimes described as "gas pain"    Complete by:  As directed   Notify physician for leaking of fluid    Complete by:  As directed   Notify physician for low, dull backache, unrelieved by heat or Tylenol    Complete by:  As directed   Notify physician for menstrual like cramps    Complete by:  As directed   Notify physician for pelvic pressure    Complete by:  As directed   Notify physician for uterine contractions.  These may be painless and feel like the uterus is tightening or the baby is  "balling up"    Complete by:  As directed   Notify physician for vaginal bleeding    Complete by:  As directed   PRETERM LABOR:  Includes any of the follwing symptoms that occur between 20 - [redacted] weeks gestation.  If these symptoms are not stopped, preterm labor can result in preterm delivery, placing your baby at risk    Complete by:  As directed       Medication  List    STOP taking these medications   NIFEdipine 10 MG capsule Commonly known as:  PROCARDIA Replaced by:  NIFEdipine 30 MG 24 hr tablet     TAKE these medications   docusate sodium 250 MG capsule Commonly known as:  COLACE Take 250 mg by mouth 2 (two) times daily as needed for constipation.   levothyroxine 50 MCG tablet Commonly known as:  SYNTHROID Take 1 tablet (50  mcg total) by mouth daily before breakfast.   MAKENA 250 mg/mL Oil injection Generic drug:  hydroxyprogesterone caproate Inject 250 mg into the muscle once a week.   NIFEdipine 30 MG 24 hr tablet Commonly known as:  PROCARDIA-XL/ADALAT CC Take 1 tablet (30 mg total) by mouth 2 (two) times daily. Replaces:  NIFEdipine 10 MG capsule   omeprazole 20 MG capsule Commonly known as:  PRILOSEC Take 1 capsule (20 mg total) by mouth daily. 1 tablet a day   prenatal multivitamin Tabs tablet Take 1 tablet by mouth at bedtime.   progesterone 200 MG capsule Commonly known as:  PROMETRIUM Place 200 mg vaginally at bedtime.        Signed: Reva Boresanya S Jadaya Sommerfield M.D. 07/19/2016, 5:23 PM

## 2016-07-22 ENCOUNTER — Encounter: Payer: Self-pay | Admitting: *Deleted

## 2016-07-22 ENCOUNTER — Ambulatory Visit (INDEPENDENT_AMBULATORY_CARE_PROVIDER_SITE_OTHER): Payer: Medicaid Other | Admitting: *Deleted

## 2016-07-22 VITALS — BP 130/62 | HR 110 | Ht 59.0 in | Wt 189.5 lb

## 2016-07-22 DIAGNOSIS — O09213 Supervision of pregnancy with history of pre-term labor, third trimester: Secondary | ICD-10-CM | POA: Diagnosis not present

## 2016-07-22 DIAGNOSIS — Z1389 Encounter for screening for other disorder: Secondary | ICD-10-CM

## 2016-07-22 DIAGNOSIS — Z331 Pregnant state, incidental: Secondary | ICD-10-CM

## 2016-07-22 DIAGNOSIS — O09893 Supervision of other high risk pregnancies, third trimester: Secondary | ICD-10-CM

## 2016-07-22 LAB — POCT URINALYSIS DIPSTICK
GLUCOSE UA: NEGATIVE
Ketones, UA: NEGATIVE
Nitrite, UA: NEGATIVE
PROTEIN UA: NEGATIVE
RBC UA: NEGATIVE

## 2016-07-22 MED ORDER — HYDROXYPROGESTERONE CAPROATE 250 MG/ML IM OIL
250.0000 mg | TOPICAL_OIL | Freq: Once | INTRAMUSCULAR | Status: AC
  Administered 2016-07-22: 250 mg via INTRAMUSCULAR

## 2016-07-22 NOTE — Progress Notes (Signed)
Pt here for 17P. Pt tolerated shot well. Return in 1 week for next shot. JSY 

## 2016-07-27 ENCOUNTER — Telehealth: Payer: Self-pay | Admitting: Obstetrics & Gynecology

## 2016-07-27 NOTE — Telephone Encounter (Signed)
Pt c/o vaginal spotting when she wipes, vaginal pain and pressure, +FM.  Per Dr. Despina HiddenEure, nothing to be alarmed, about keep appt for Friday. [facility]. Pt informed of preterm labor precautions and [facility] any [facility]. Pt verbalized understanding.

## 2016-07-29 ENCOUNTER — Ambulatory Visit (INDEPENDENT_AMBULATORY_CARE_PROVIDER_SITE_OTHER): Payer: Medicaid Other | Admitting: Obstetrics and Gynecology

## 2016-07-29 ENCOUNTER — Encounter: Payer: Self-pay | Admitting: Obstetrics and Gynecology

## 2016-07-29 VITALS — BP 140/70 | HR 106 | Wt 190.5 lb

## 2016-07-29 DIAGNOSIS — O09213 Supervision of pregnancy with history of pre-term labor, third trimester: Secondary | ICD-10-CM | POA: Diagnosis not present

## 2016-07-29 DIAGNOSIS — Z8659 Personal history of other mental and behavioral disorders: Secondary | ICD-10-CM

## 2016-07-29 DIAGNOSIS — Z3483 Encounter for supervision of other normal pregnancy, third trimester: Secondary | ICD-10-CM

## 2016-07-29 DIAGNOSIS — Z1389 Encounter for screening for other disorder: Secondary | ICD-10-CM | POA: Diagnosis not present

## 2016-07-29 DIAGNOSIS — Z3A35 35 weeks gestation of pregnancy: Secondary | ICD-10-CM

## 2016-07-29 DIAGNOSIS — O09893 Supervision of other high risk pregnancies, third trimester: Secondary | ICD-10-CM

## 2016-07-29 DIAGNOSIS — O3433 Maternal care for cervical incompetence, third trimester: Secondary | ICD-10-CM | POA: Diagnosis not present

## 2016-07-29 DIAGNOSIS — O9989 Other specified diseases and conditions complicating pregnancy, childbirth and the puerperium: Secondary | ICD-10-CM

## 2016-07-29 DIAGNOSIS — Z3493 Encounter for supervision of normal pregnancy, unspecified, third trimester: Secondary | ICD-10-CM

## 2016-07-29 DIAGNOSIS — O99891 Other specified diseases and conditions complicating pregnancy: Secondary | ICD-10-CM

## 2016-07-29 DIAGNOSIS — O3430 Maternal care for cervical incompetence, unspecified trimester: Secondary | ICD-10-CM | POA: Insufficient documentation

## 2016-07-29 DIAGNOSIS — Z331 Pregnant state, incidental: Secondary | ICD-10-CM

## 2016-07-29 LAB — POCT URINALYSIS DIPSTICK
GLUCOSE UA: NEGATIVE
KETONES UA: NEGATIVE
Nitrite, UA: NEGATIVE
Protein, UA: NEGATIVE

## 2016-07-29 MED ORDER — HYDROXYPROGESTERONE CAPROATE 250 MG/ML IM OIL
250.0000 mg | TOPICAL_OIL | Freq: Once | INTRAMUSCULAR | Status: AC
  Administered 2016-07-29: 250 mg via INTRAMUSCULAR

## 2016-07-29 NOTE — Progress Notes (Signed)
Pt received 17P. Pt tolerated shot well. Return in 1 week for LAST 17P. JSY

## 2016-07-29 NOTE — Progress Notes (Addendum)
[id number]  Estimated Date of Delivery: 09/03/16 LROB 4513w6d  Blood pressure 140/70, pulse (!) 106, weight 190 lb 8 oz (86.4 kg), last menstrual period 10/31/2015.   Urine results:notable for trace blood and moderate leukocytes   Chief Complaint  Patient presents with   High Risk Gestation    17P; still having contractions with Procardia every 15-20 minutes; lasts all day; + pressure    Patient complaints: having contractions every 15-20 min for the last 2 days.She was checked recently when the contractions were more frequent and the cervix was long and closed. Patient reports   good fetal movement,                           denies any bleeding , rupture of membranes,or regular contractions.   refer to the ob flow sheet for FH and FHR, ,                          Physical Examination: General appearance - alert, well appearing, and in no distress and oriented to person, place, and time                                      Abdomen - FH 34 cm ,                                                         -FHR 149                                                         Pelvic - examination declined by patient after                                                                       discussion                                            Patient is concerned for postpartum depression due to having this with prior pregnancy. She would like to start taking anti-depressants after delivery as prophylaxis. Questions were answered. Assessment: LROB [id number] @ 9113w6d                           At risk for postpartum depression Plan:  Continued routine obstetrical care,           We will plan on prophylactic postpartum depression treatment upon delivery          Patient aware that since this baby will be term or near-term that the likelihood of depression may be less, she'll be less stressed them with her previously  extreme preterm birth infant F/u in 1 week for routine prenatal visit.    By signing my name  below, I, Sonum Patel, attest that this documentation has been prepared under the direction and in the presence of Tilda Burrow, MD. Electronically Signed: Sonum Patel, Neurosurgeon. 07/29/16. 1:13 PM.  I personally performed the services described in this documentation, which was SCRIBED in my presence. The recorded information has been reviewed and considered accurate. It has been edited as necessary during review. Tilda Burrow, MD

## 2016-07-29 NOTE — Progress Notes (Signed)
[id number]  Estimated Date of Delivery: 09/03/16 LROB 3789w6d  Blood pressure 140/70, pulse (!) 106, weight 190 lb 8 oz (86.4 kg), last menstrual period 10/31/2015.   Urine results:notable for trace blood and moderate leukocytes   Chief Complaint  Patient presents with  . High Risk Gestation    17P; still having contractions with Procardia every 15-20 minutes; lasts all day; + pressure    Patient complaints: having contractions every 15-20 min for the last 2 days.She was checked recently when the contractions were more frequent and the cervix was long and closed. Patient reports   good fetal movement,                           denies any bleeding , rupture of membranes,or regular contractions.   refer to the ob flow sheet for FH and FHR, ,                          Physical Examination: General appearance - alert, well appearing, and in no distress and oriented to person, place, and time                                      Abdomen - FH 34 cm ,                                                         -FHR 149                                                         Pelvic - examination declined by patient after                                                                       discussion                                            Patient is concerned for postpartum depression due to having this with prior pregnancy. She would like to start taking anti-depressants after delivery as prophylaxis. Questions were answered. Assessment: HROB [id number] @ 3089w6d                           At risk for postpartum depression                          Preterm retractions, on Procardia XL 30 twice a day                          History of preterm  birth 26 weeks after cervical incompetency and rescue cerclage Plan:  Continued routine obstetrical care,           We will plan on prophylactic postpartum depression treatment upon delivery          Patient aware that since this baby will be term or near-term that the  likelihood of depression may be less, she'll be less stressed them with her previously extreme preterm birth infant F/u in 1 week for routine prenatal visit.    By signing my name below, I, Sonum Patel, attest that this documentation has been prepared under the direction and in the presence of Tilda Burrow, MD. Electronically Signed: Sonum Patel, Neurosurgeon. 07/29/16. 1:13 PM.  I personally performed the services described in this documentation, which was SCRIBED in my presence. The recorded information has been reviewed and considered accurate. It has been edited as necessary during review. Tilda Burrow, MD

## 2016-07-30 ENCOUNTER — Encounter (HOSPITAL_COMMUNITY): Payer: Self-pay

## 2016-07-30 ENCOUNTER — Inpatient Hospital Stay (HOSPITAL_COMMUNITY)
Admission: AD | Admit: 2016-07-30 | Discharge: 2016-07-31 | DRG: 778 | Disposition: A | Discharge status: Home / Self Care | Payer: Medicaid Other | Source: Ambulatory Visit | Attending: Obstetrics & Gynecology | Admitting: Obstetrics & Gynecology

## 2016-07-30 DIAGNOSIS — O3433 Maternal care for cervical incompetence, third trimester: Secondary | ICD-10-CM | POA: Diagnosis present

## 2016-07-30 DIAGNOSIS — Z3493 Encounter for supervision of normal pregnancy, unspecified, third trimester: Secondary | ICD-10-CM

## 2016-07-30 DIAGNOSIS — Z3A35 35 weeks gestation of pregnancy: Secondary | ICD-10-CM

## 2016-07-30 DIAGNOSIS — O479 False labor, unspecified: Secondary | ICD-10-CM | POA: Diagnosis present

## 2016-07-30 DIAGNOSIS — O4700 False labor before 37 completed weeks of gestation, unspecified trimester: Secondary | ICD-10-CM | POA: Diagnosis present

## 2016-07-30 LAB — CBC
HEMATOCRIT: 33.2 % — AB (ref 36.0–46.0)
HEMOGLOBIN: 11.7 g/dL — AB (ref 12.0–15.0)
MCH: 29.8 pg (ref 26.0–34.0)
MCHC: 35.2 g/dL (ref 30.0–36.0)
MCV: 84.5 fL (ref 78.0–100.0)
Platelets: 294 10*3/uL (ref 150–400)
RBC: 3.93 MIL/uL (ref 3.87–5.11)
RDW: 13.1 % (ref 11.5–15.5)
WBC: 16.1 10*3/uL — AB (ref 4.0–10.5)

## 2016-07-30 LAB — URINALYSIS, ROUTINE W REFLEX MICROSCOPIC
Bilirubin Urine: NEGATIVE
Glucose, UA: NEGATIVE mg/dL
Hgb urine dipstick: NEGATIVE
KETONES UR: NEGATIVE mg/dL
NITRITE: NEGATIVE
PROTEIN: NEGATIVE mg/dL
Specific Gravity, Urine: 1.02 (ref 1.005–1.030)
pH: 5.5 (ref 5.0–8.0)

## 2016-07-30 LAB — OB RESULTS CONSOLE GBS: GBS: NEGATIVE

## 2016-07-30 LAB — URINE MICROSCOPIC-ADD ON: RBC / HPF: NONE SEEN RBC/hpf (ref 0–5)

## 2016-07-30 LAB — OB RESULTS CONSOLE GC/CHLAMYDIA: Gonorrhea: NEGATIVE

## 2016-07-30 LAB — TYPE AND SCREEN
ABO/RH(D): O POS
ANTIBODY SCREEN: NEGATIVE

## 2016-07-30 LAB — WET PREP, GENITAL
Clue Cells Wet Prep HPF POC: NONE SEEN
SPERM: NONE SEEN
Trich, Wet Prep: NONE SEEN
Yeast Wet Prep HPF POC: NONE SEEN

## 2016-07-30 MED ORDER — ACETAMINOPHEN 325 MG PO TABS
650.0000 mg | ORAL_TABLET | ORAL | Status: DC | PRN
  Administered 2016-07-31: 650 mg via ORAL
  Filled 2016-07-30 (×2): qty 2

## 2016-07-30 MED ORDER — ONDANSETRON HCL 4 MG/2ML IJ SOLN
4.0000 mg | Freq: Four times a day (QID) | INTRAMUSCULAR | Status: DC | PRN
Start: 2016-07-30 — End: 2016-07-31

## 2016-07-30 MED ORDER — PENICILLIN G POTASSIUM 5000000 UNITS IJ SOLR
5.0000 10*6.[IU] | Freq: Once | INTRAVENOUS | Status: AC
  Administered 2016-07-31: 5 10*6.[IU] via INTRAVENOUS
  Filled 2016-07-30: qty 5

## 2016-07-30 MED ORDER — FLEET ENEMA 7-19 GM/118ML RE ENEM: 1.0000 | ENEMA | RECTAL | Status: DC | PRN

## 2016-07-30 MED ORDER — LACTATED RINGERS IV SOLN: 500.0000 mL | INTRAVENOUS | Status: DC | PRN

## 2016-07-30 MED ORDER — OXYTOCIN BOLUS FROM INFUSION: 500.0000 mL | Freq: Once | INTRAVENOUS | Status: DC

## 2016-07-30 MED ORDER — LIDOCAINE HCL (PF) 1 % IJ SOLN: 30.0000 mL | INTRAMUSCULAR | Status: DC | PRN

## 2016-07-30 MED ORDER — LACTATED RINGERS IV SOLN
INTRAVENOUS | Status: DC
  Administered 2016-07-31: via INTRAVENOUS

## 2016-07-30 MED ORDER — OXYCODONE-ACETAMINOPHEN 5-325 MG PO TABS: 2.0000 | ORAL_TABLET | ORAL | Status: DC | PRN

## 2016-07-30 MED ORDER — NIFEDIPINE ER OSMOTIC RELEASE 30 MG PO TB24
30.0000 mg | ORAL_TABLET | Freq: Once | ORAL | Status: AC
  Administered 2016-07-30: 30 mg via ORAL
  Filled 2016-07-30: qty 1

## 2016-07-30 MED ORDER — OXYCODONE-ACETAMINOPHEN 5-325 MG PO TABS
1.0000 | ORAL_TABLET | ORAL | Status: DC | PRN
Start: 2016-07-30 — End: 2016-07-31

## 2016-07-30 MED ORDER — SOD CITRATE-CITRIC ACID 500-334 MG/5ML PO SOLN: 30.0000 mL | ORAL | Status: DC | PRN

## 2016-07-30 MED ORDER — LACTATED RINGERS IV BOLUS (SEPSIS)
1000.0000 mL | Freq: Once | INTRAVENOUS | Status: AC
  Administered 2016-07-30: 1000 mL via INTRAVENOUS

## 2016-07-30 MED ORDER — OXYTOCIN 40 UNITS IN LACTATED RINGERS INFUSION - SIMPLE MED: 2.5000 [IU]/h | INTRAVENOUS | Status: DC

## 2016-07-30 MED ORDER — PENICILLIN G POTASSIUM 5000000 UNITS IJ SOLR
2.5000 10*6.[IU] | INTRAVENOUS | Status: DC
  Administered 2016-07-31: 2.5 10*6.[IU] via INTRAVENOUS
  Filled 2016-07-30 (×5): qty 2.5

## 2016-07-30 NOTE — MAU Note (Signed)
Contracting for hour and half and feel "pulling on cervical cerclage". Denies LOF or bleeding

## 2016-07-31 ENCOUNTER — Encounter (HOSPITAL_COMMUNITY): Payer: Self-pay

## 2016-07-31 LAB — RPR: RPR Ser Ql: NONREACTIVE

## 2016-07-31 LAB — GROUP B STREP BY PCR: GROUP B STREP BY PCR: NEGATIVE

## 2016-07-31 NOTE — Discharge Instructions (Signed)
Preterm Labor Information Preterm labor is when labor starts before you are [redacted] weeks pregnant. The normal length of pregnancy is 39 to 41 weeks.  CAUSES  The cause of preterm labor is not often known. The most common known cause is infection. RISK FACTORS  Having a history of preterm labor.  Having your water break before it should.  Having a placenta that covers the opening of the cervix.  Having a placenta that breaks away from the uterus.  Having a cervix that is too weak to hold the baby in the uterus.  Having too much fluid in the amniotic sac.  Taking drugs or smoking while pregnant.  Not gaining enough weight while pregnant.  Being younger than 67 and older than 22 years old.  Having a low income.  Being African American. SYMPTOMS  Period-like cramps, belly (abdominal) pain, or back pain.  Contractions that are regular, as often as six in an hour. They may be mild or painful.  Contractions that start at the top of the belly. They then move to the lower belly and back.  Lower belly pressure that seems to get stronger.  Bleeding from the vagina.  Fluid leaking from the vagina. TREATMENT  Treatment depends on:  Your condition.  The condition of your baby.  How many weeks pregnant you are. Your doctor may have you:  Take medicine to stop contractions.  Stay in bed except to use the restroom (bed rest).  Stay in the hospital. WHAT SHOULD YOU DO IF YOU THINK YOU ARE IN PRETERM LABOR? Call your doctor right away. You need to go to the hospital right away.  HOW CAN YOU PREVENT PRETERM LABOR IN FUTURE PREGNANCIES?  Stop smoking, if you smoke.  Maintain healthy weight gain.  Do not take drugs or be around chemicals that are not needed.  Tell your doctor if you think you have an infection.  Tell your doctor if you had a preterm labor before.   This information is not intended to replace advice given to you by your health care provider. Make sure you  discuss any questions you have with your health care provider.   Document Released: 02/10/2009 Document Revised: 03/31/2015 Document Reviewed: 12/17/2012 Elsevier Interactive Patient Education 2016 Elsevier Inc.   [employer] of the uterus can occur throughout pregnancy. Contractions are not always a sign that you are in labor.  WHAT ARE BRAXTON HICKS CONTRACTIONS?  Contractions that occur before labor are called Braxton Hicks contractions, or false labor. Toward the end of pregnancy (32-34 weeks), these contractions can develop more often and may become more forceful. This is not true labor because these contractions do not result in opening (dilatation) and thinning of the cervix. They are sometimes difficult to tell apart from true labor because these contractions can be forceful and people have different pain tolerances. You should not feel embarrassed if you go to the hospital with false labor. Sometimes, the only way to tell if you are in true labor is for your health care provider to look for changes in the cervix. If there are no prenatal problems or other health problems associated with the pregnancy, it is completely safe to be sent home with false labor and await the onset of true labor. HOW CAN YOU TELL THE DIFFERENCE BETWEEN TRUE AND FALSE LABOR? False Labor  The contractions of false labor are usually shorter and not as hard as those of true labor.   The contractions are usually irregular.  The contractions are often felt in the front of the lower abdomen and in the groin.   The contractions may go away when you walk around or change positions while lying down.   The contractions get weaker and are shorter lasting as time goes on.   The contractions do not usually become progressively stronger, regular, and closer together as with true labor.  True Labor  Contractions in true labor last 30-70 seconds, become very regular, usually become  more intense, and increase in frequency.   The contractions do not go away with walking.   The discomfort is usually felt in the top of the uterus and spreads to the lower abdomen and low back.   True labor can be determined by your health care provider with an exam. This will show that the cervix is dilating and getting thinner.  WHAT TO REMEMBER  Keep up with your usual exercises and follow other instructions given by your health care provider.   Take medicines as directed by your health care provider.   Keep your regular prenatal appointments.   Eat and drink lightly if you think you are going into labor.   If Braxton Hicks contractions are making you uncomfortable:   Change your position from lying down or resting to walking, or from walking to resting.   Sit and rest in a tub of warm water.   Drink 2-3 glasses of water. Dehydration may cause these contractions.   Do slow and deep breathing several times an hour.  WHEN SHOULD I SEEK IMMEDIATE MEDICAL CARE? Seek immediate medical care if:  Your contractions become stronger, more regular, and closer together.   You have fluid leaking or gushing from your vagina.   You have a fever.   You pass blood-tinged mucus.   You have vaginal bleeding.   You have continuous abdominal pain.   You have low back pain that you never had before.   You feel your baby's head pushing down and causing pelvic pressure.   Your baby is not moving as much as it used to.    This information is not intended to replace advice given to you by your health care provider. Make sure you discuss any questions you have with your health care provider.   Document Released: 11/14/2005 Document Revised: 11/19/2013 Document Reviewed: 08/26/2013 Elsevier Interactive Patient Education [employer]2016 Elsevier Inc.

## 2016-08-01 ENCOUNTER — Encounter (HOSPITAL_COMMUNITY): Payer: Self-pay | Admitting: *Deleted

## 2016-08-01 ENCOUNTER — Inpatient Hospital Stay (HOSPITAL_COMMUNITY)
Admission: AD | Admit: 2016-08-01 | Discharge: 2016-08-01 | Disposition: A | Discharge status: Home / Self Care | Payer: Medicaid Other | Source: Ambulatory Visit | Attending: Obstetrics & Gynecology | Admitting: Obstetrics & Gynecology

## 2016-08-01 DIAGNOSIS — O4703 False labor before 37 completed weeks of gestation, third trimester: Secondary | ICD-10-CM | POA: Diagnosis not present

## 2016-08-01 DIAGNOSIS — O479 False labor, unspecified: Secondary | ICD-10-CM

## 2016-08-01 DIAGNOSIS — O4700 False labor before 37 completed weeks of gestation, unspecified trimester: Secondary | ICD-10-CM

## 2016-08-01 DIAGNOSIS — Z3A35 35 weeks gestation of pregnancy: Secondary | ICD-10-CM | POA: Diagnosis not present

## 2016-08-01 DIAGNOSIS — Z3493 Encounter for supervision of normal pregnancy, unspecified, third trimester: Secondary | ICD-10-CM

## 2016-08-01 NOTE — MAU Provider Note (Signed)
History     CSN: [id number]  Arrival date and time: 08/01/16 2042   First Provider Initiated Contact with Patient 08/01/16 2152      Chief Complaint  Patient presents with  . Contractions   Tara Mcmahon is a 20 y.o. [id number] at 8026w2d who presents today with contractions. She was here on antenatal and had her cerclage removed. She states that she has had some cramping off and on. She states that the contractions have stopped now. She denies any VB or LOF. She confirms normal fetal movement.      Past Medical History:  Diagnosis Date  . Anxiety   . Cervical incompetence during pregnancy   . Depression   . Hypothyroidism   . PCOS (polycystic ovarian syndrome)     Past Surgical History:  Procedure Laterality Date  . CERVICAL CERCLAGE    . CERVICAL CERCLAGE N/A 03/09/2016   [facility];  Surgeon: Lazaro ArmsLuther H Eure, MD;  Location: [facility];  Service: Gynecology;  Laterality: N/A;  . labial reduction      Family History  Problem Relation Age of Onset  . Arthritis Mother   . Lupus Mother   . Autoimmune disease Mother   . Cancer Maternal Grandmother     breast  . Heart disease Maternal Grandfather   . Heart disease Paternal Grandfather   . Hypertension Paternal Grandfather     Social History  Substance Use Topics  . Smoking status: Never Smoker  . Smokeless tobacco: Never Used  . Alcohol use No    Allergies: No Known Allergies  Prescriptions Prior to Admission  Medication Sig Dispense Refill Last Dose  . calcium carbonate (TUMS - DOSED IN MG ELEMENTAL CALCIUM) 500 MG chewable tablet Chew 1 tablet by mouth 5 (five) times daily as needed for indigestion or heartburn.   07/31/2016 at Unknown time  . docusate sodium (COLACE) 250 MG capsule Take 250 mg by mouth 2 (two) times daily as needed for constipation.   Past Week at Unknown time  . hydroxyprogesterone caproate (MAKENA) 250 mg/mL OIL injection Inject 250 mg into the muscle once a week.   Past  Week at Unknown time  . levothyroxine (SYNTHROID) 50 MCG tablet Take 1 tablet (50 mcg total) by mouth daily before breakfast. 30 tablet 3 Past Week at Unknown time  . NIFEdipine (PROCARDIA-XL/ADALAT CC) 30 MG 24 hr tablet Take 1 tablet (30 mg total) by mouth 2 (two) times daily. 60 tablet 0 07/31/2016 at Unknown time  . omeprazole (PRILOSEC) 20 MG capsule Take 1 capsule (20 mg total) by mouth daily. 1 tablet a day 30 capsule 6 Past Week at Unknown time  . Prenatal Vit-Fe Fumarate-FA (PRENATAL MULTIVITAMIN) TABS tablet Take 1 tablet by mouth at bedtime.    07/31/2016 at Unknown time  . progesterone (PROMETRIUM) 200 MG capsule Place 200 mg vaginally at bedtime.   07/31/2016 at Unknown time    Review of Systems  Constitutional: Negative for chills and fever.  Gastrointestinal: Positive for abdominal pain. Negative for constipation, diarrhea, nausea and vomiting.  Genitourinary: Negative for dysuria, frequency and urgency.   Physical Exam   Blood pressure 131/77, pulse (!) 121, temperature 98.3 F (36.8 C), temperature source Oral, resp. rate 18, last menstrual period 10/31/2015, SpO2 98 %.  Physical Exam  Nursing note and vitals reviewed. Constitutional: She is oriented to person, place, and time. She appears well-developed and well-nourished. No distress.  HENT:  Head: Normocephalic.  Cardiovascular: Normal rate.   Respiratory: Effort  normal.  GI: Soft. There is no tenderness. There is no rebound.  Neurological: She is alert and oriented to person, place, and time.  Skin: Skin is warm and dry.  Psychiatric: She has a normal mood and affect.   FHT: 150 moderate with 15x15 accels, no decels Toco: no UCs  MAU Course  Procedures  MDM No cervical change from last visit here No contractions on monitor. Patient reports that contractions have stopped  Assessment and Plan   1. Threatened premature labor, antepartum   2. Supervision of normal pregnancy, third trimester   3. Braxton Hicks  contractions   4. [redacted] weeks gestation of pregnancy    DC home Comfort measures reviewed  3rd Trimester precautions  PTL precautions  Fetal kick counts RX: none  Return to MAU as needed FU with OB as planned  Follow-up Information    [facility] .   Specialty:  Obstetrics and Gynecology Contact information: [address] [place] [address] [phone]           Tawnya Crook 08/01/2016, 9:58 PM

## 2016-08-01 NOTE — Discharge Instructions (Signed)

## 2016-08-01 NOTE — MAU Note (Signed)
PT  SAYS SHE HAS STRONG  UC  SINCE  712PM.     REMOVED  CIRCLAGE ON  SAT  WAS ADMITTED  TO L/D - THEN SENT  HOME.   .    VE ON SAT  2-3  CM.

## 2016-08-02 LAB — GC/CHLAMYDIA PROBE AMP (~~LOC~~) NOT AT ARMC
CHLAMYDIA, DNA PROBE: NEGATIVE
NEISSERIA GONORRHEA: NEGATIVE

## 2016-08-03 ENCOUNTER — Telehealth: Payer: Self-pay | Admitting: Women's Health

## 2016-08-03 NOTE — Telephone Encounter (Signed)
Pt states she loss her mucus plug had a little pink tinge with it. Is this normal. Pt informed can be normal, if cramping, gush of fluids, vaginal bleeding, +FM, call our office or [facility]. Pt verbalized understanding.

## 2016-08-05 ENCOUNTER — Ambulatory Visit (INDEPENDENT_AMBULATORY_CARE_PROVIDER_SITE_OTHER): Payer: Medicaid Other | Admitting: Obstetrics and Gynecology

## 2016-08-05 VITALS — BP 126/80 | HR 90 | Wt 191.6 lb

## 2016-08-05 DIAGNOSIS — Z3A36 36 weeks gestation of pregnancy: Secondary | ICD-10-CM

## 2016-08-05 DIAGNOSIS — O3433 Maternal care for cervical incompetence, third trimester: Secondary | ICD-10-CM

## 2016-08-05 DIAGNOSIS — O0993 Supervision of high risk pregnancy, unspecified, third trimester: Secondary | ICD-10-CM

## 2016-08-05 DIAGNOSIS — Z331 Pregnant state, incidental: Secondary | ICD-10-CM | POA: Diagnosis not present

## 2016-08-05 DIAGNOSIS — O09213 Supervision of pregnancy with history of pre-term labor, third trimester: Secondary | ICD-10-CM | POA: Diagnosis not present

## 2016-08-05 DIAGNOSIS — O0990 Supervision of high risk pregnancy, unspecified, unspecified trimester: Secondary | ICD-10-CM

## 2016-08-05 DIAGNOSIS — Z1389 Encounter for screening for other disorder: Secondary | ICD-10-CM

## 2016-08-05 LAB — POCT URINALYSIS DIPSTICK
GLUCOSE UA: NEGATIVE
KETONES UA: NEGATIVE
Leukocytes, UA: NEGATIVE
Nitrite, UA: NEGATIVE
Protein, UA: NEGATIVE
RBC UA: NEGATIVE

## 2016-08-05 NOTE — Progress Notes (Signed)
[id number] 6034w6d Estimated Date of Delivery: 09/03/16 LROB  Patient reports good fetal movement, denies any bleeding and no rupture of membranes symptoms or regular contractions. Patient complaints: braxton hicks, that she would like further evalutated. Pt notes that she is still taking the betamethasone. Pt denies any other symptoms.   Blood pressure 126/80, pulse 90, weight 191 lb 9.6 oz (86.9 kg), last menstrual period 10/31/2015. refer to the ob flow sheet for FH and FHR, also BP, Wt, Urine results:notable for negative                           Physical Examination:  General appearance - alert, well appearing, and in no distress Abdomen - FHR  140  soft, nontender, nondistended, no masses or organomegaly  Pelvic - normal external genitalia, vulva, vagina, cervix, uterus and adnexa. Cervix: 1 cm, long,                                            Questions were answered. Assessment: LROB [id number] @ 2534w6d, no contractions  Plan:  Continued routine obstetrical care, d/c procardia   F/u in 1 week for routine OB    By signing my name below, I, Soijett Blue, attest that this documentation has been prepared under the direction and in the presence of Tilda BurrowJohn V Teyon Odette, MD. Electronically Signed: Soijett Blue, ED Scribe. 08/05/16. 1:08 PM.  I personally performed the services described in this documentation, which was SCRIBED in my presence. The recorded information has been reviewed and considered accurate. It has been edited as necessary during review. Tilda BurrowFERGUSON,Martyn Timme V, MD

## 2016-08-19 ENCOUNTER — Encounter: Payer: Self-pay | Admitting: Obstetrics & Gynecology

## 2016-08-19 ENCOUNTER — Ambulatory Visit (INDEPENDENT_AMBULATORY_CARE_PROVIDER_SITE_OTHER): Payer: Medicaid Other | Admitting: Obstetrics & Gynecology

## 2016-08-19 VITALS — BP 120/90 | HR 78 | Wt 191.0 lb

## 2016-08-19 DIAGNOSIS — Z3493 Encounter for supervision of normal pregnancy, unspecified, third trimester: Secondary | ICD-10-CM

## 2016-08-19 DIAGNOSIS — O3433 Maternal care for cervical incompetence, third trimester: Secondary | ICD-10-CM

## 2016-08-19 DIAGNOSIS — Z3A38 38 weeks gestation of pregnancy: Secondary | ICD-10-CM

## 2016-08-19 DIAGNOSIS — Z1389 Encounter for screening for other disorder: Secondary | ICD-10-CM

## 2016-08-19 DIAGNOSIS — O3430 Maternal care for cervical incompetence, unspecified trimester: Secondary | ICD-10-CM

## 2016-08-19 DIAGNOSIS — O09213 Supervision of pregnancy with history of pre-term labor, third trimester: Secondary | ICD-10-CM | POA: Diagnosis not present

## 2016-08-19 DIAGNOSIS — O368131 Decreased fetal movements, third trimester, fetus 1: Secondary | ICD-10-CM | POA: Diagnosis not present

## 2016-08-19 DIAGNOSIS — Z331 Pregnant state, incidental: Secondary | ICD-10-CM | POA: Diagnosis not present

## 2016-08-19 DIAGNOSIS — O09893 Supervision of other high risk pregnancies, third trimester: Secondary | ICD-10-CM | POA: Diagnosis not present

## 2016-08-19 LAB — POCT URINALYSIS DIPSTICK
Glucose, UA: NEGATIVE
Ketones, UA: NEGATIVE
Leukocytes, UA: NEGATIVE
NITRITE UA: NEGATIVE
PROTEIN UA: NEGATIVE
RBC UA: NEGATIVE

## 2016-08-21 ENCOUNTER — Encounter (HOSPITAL_COMMUNITY): Payer: Self-pay | Admitting: Certified Nurse Midwife

## 2016-08-21 ENCOUNTER — Inpatient Hospital Stay (HOSPITAL_COMMUNITY)
Admission: AD | Admit: 2016-08-21 | Discharge: 2016-08-21 | Disposition: A | Discharge status: Home / Self Care | Payer: Medicaid Other | Source: Ambulatory Visit | Attending: Obstetrics & Gynecology | Admitting: Obstetrics & Gynecology

## 2016-08-21 DIAGNOSIS — O368130 Decreased fetal movements, third trimester, not applicable or unspecified: Secondary | ICD-10-CM | POA: Diagnosis not present

## 2016-08-21 DIAGNOSIS — Z3A38 38 weeks gestation of pregnancy: Secondary | ICD-10-CM | POA: Diagnosis not present

## 2016-08-21 DIAGNOSIS — E039 Hypothyroidism, unspecified: Secondary | ICD-10-CM | POA: Diagnosis not present

## 2016-08-21 DIAGNOSIS — O99283 Endocrine, nutritional and metabolic diseases complicating pregnancy, third trimester: Secondary | ICD-10-CM | POA: Insufficient documentation

## 2016-08-21 DIAGNOSIS — O3433 Maternal care for cervical incompetence, third trimester: Secondary | ICD-10-CM | POA: Insufficient documentation

## 2016-08-21 NOTE — Discharge Instructions (Signed)
Fetal Movement Counts    Performing a fetal movement count is highly recommended in high-risk pregnancies, but it is good for every pregnant woman to do. Your health care provider may ask you to start counting fetal movements at 28 weeks of the pregnancy. Fetal movements often increase:  · After eating a full meal.  · After physical activity.  · After eating or drinking something sweet or cold.  · At rest.  Pay attention to when you feel the baby is most active. This will help you notice a pattern of your baby's sleep and wake cycles and what factors contribute to an increase in fetal movement. It is important to perform a fetal movement count at the same time each day when your baby is normally most active.   HOW TO COUNT FETAL MOVEMENTS  1. Find a quiet and comfortable area to sit or lie down on your left side. Lying on your left side provides the best blood and oxygen circulation to your baby.  2. Write down the day and time on a sheet of paper or in a journal.  3. Start counting kicks, flutters, swishes, rolls, or jabs in a 2-hour period. You should feel at least 10 movements within 2 hours.  4. If you do not feel 10 movements in 2 hours, wait 2-3 hours and count again. Look for a change in the pattern or not enough counts in 2 hours.  SEEK MEDICAL CARE IF:  · You feel less than 10 counts in 2 hours, tried twice.  · There is no movement in over an hour.  · The pattern is changing or taking longer each day to reach 10 counts in 2 hours.  · You feel the baby is not moving as he or she usually does.  This information is not intended to replace advice given to you by your health care provider. Make sure you discuss any questions you have with your health care provider.     Document Released: 12/14/2006 Document Revised: 12/05/2014 Document Reviewed: 09/10/2012  Elsevier Interactive Patient Education ©2016 Elsevier Inc.

## 2016-08-21 NOTE — MAU Note (Signed)
Pt states baby has not moved much today. Pt states that she is feeling cramping and pressure. Pt denies LOF or vaginal bleeding.

## 2016-08-21 NOTE — MAU Provider Note (Signed)
History     CSN: [id number]  Arrival date and time: 08/21/16 2010   None     Chief Complaint  Patient presents with  . Decreased Fetal Movement   Tara Mcmahon is a 20 y.o. [id number] at 584w1d who presents with c/o decreased fetal movements today. States has felt some movement every 1-2hours but today felt like it was less, usually would feel several movements every 30min. Has had some floaters and hand swelling over the last 1 week but denies HA, no h/o BP in this pregnancy. Denies LOF, vaginal bleeding. Currently feels good fetal movement.     OB History    Gravida Para Term Preterm AB Living   3 1   1 1 1    SAB TAB Ectopic Multiple Live Births   1       1      Past Medical History:  Diagnosis Date  . Anxiety   . Cervical incompetence during pregnancy   . Depression   . Hypothyroidism   . PCOS (polycystic ovarian syndrome)     Past Surgical History:  Procedure Laterality Date  . CERVICAL CERCLAGE    . CERVICAL CERCLAGE N/A 03/09/2016   [facility];  Surgeon: Lazaro ArmsLuther H Eure, MD;  Location: [facility];  Service: Gynecology;  Laterality: N/A;  . labial reduction      Family History  Problem Relation Age of Onset  . Arthritis Mother   . Lupus Mother   . Autoimmune disease Mother   . Cancer Maternal Grandmother     breast  . Heart disease Maternal Grandfather   . Heart disease Paternal Grandfather   . Hypertension Paternal Grandfather     Social History  Substance Use Topics  . Smoking status: Never Smoker  . Smokeless tobacco: Never Used  . Alcohol use No    Allergies: No Known Allergies  Prescriptions Prior to Admission  Medication Sig Dispense Refill Last Dose  . calcium carbonate (TUMS - DOSED IN MG ELEMENTAL CALCIUM) 500 MG chewable tablet Chew 1 tablet by mouth 5 (five) times daily as needed for indigestion or heartburn.   Taking  . levothyroxine (SYNTHROID) 50 MCG tablet Take 1 tablet (50 mcg total) by mouth daily before  breakfast. 30 tablet 3 Taking  . omeprazole (PRILOSEC) 20 MG capsule Take 1 capsule (20 mg total) by mouth daily. 1 tablet a day 30 capsule 6 Taking  . Prenatal Vit-Fe Fumarate-FA (PRENATAL MULTIVITAMIN) TABS tablet Take 1 tablet by mouth at bedtime.    Taking    Review of Systems  Eyes: Negative for blurred vision, double vision and photophobia.       Sees occasional floaters  Respiratory: Negative for shortness of breath.   Cardiovascular: Negative for chest pain.  Gastrointestinal: Negative for abdominal pain, nausea and vomiting.  Genitourinary: Negative for dysuria, frequency and urgency.  Musculoskeletal: Negative for back pain.  Neurological: Negative for headaches.   Physical Exam   Blood pressure 139/77, pulse 116, temperature 98.1 F (36.7 C), temperature source Oral, resp. rate 18, last menstrual period 10/31/2015.  Physical Exam  Constitutional: She is oriented to person, place, and time. She appears well-developed and well-nourished. No distress.  HENT:  Head: Normocephalic and atraumatic.  Eyes: Conjunctivae and EOM are normal.  Cardiovascular: Normal rate, regular rhythm and normal heart sounds.   No murmur heard. Respiratory: Effort normal and breath sounds normal. No respiratory distress. She has no wheezes.  GI: Soft. Bowel sounds are normal. There is no tenderness.  There is no rebound and no guarding.  Genitourinary: Vagina normal. No vaginal discharge found.  Musculoskeletal: Normal range of motion.  Neurological: She is alert and oriented to person, place, and time.  Skin: Skin is warm and dry.  Psychiatric: She has a normal mood and affect.    MAU Course  Procedures  MDM NST reactive and cervical exam 0.5/thick/high, posterior and firm  Assessment and Plan  IUP@38 .1 -given return precautions and labor precautions.  Leland Her PGY-1 08/21/2016, 9:27 PM

## 2016-08-25 ENCOUNTER — Encounter (HOSPITAL_COMMUNITY): Payer: Self-pay | Admitting: *Deleted

## 2016-08-25 ENCOUNTER — Inpatient Hospital Stay (HOSPITAL_COMMUNITY)
Admission: AD | Admit: 2016-08-25 | Discharge: 2016-08-25 | Disposition: A | Discharge status: Home / Self Care | Payer: Medicaid Other | Source: Ambulatory Visit | Attending: Obstetrics & Gynecology | Admitting: Obstetrics & Gynecology

## 2016-08-25 DIAGNOSIS — Z3A38 38 weeks gestation of pregnancy: Secondary | ICD-10-CM | POA: Diagnosis not present

## 2016-08-25 DIAGNOSIS — O368130 Decreased fetal movements, third trimester, not applicable or unspecified: Secondary | ICD-10-CM | POA: Insufficient documentation

## 2016-08-25 DIAGNOSIS — Z3493 Encounter for supervision of normal pregnancy, unspecified, third trimester: Secondary | ICD-10-CM

## 2016-08-25 NOTE — MAU Note (Signed)
Urine sent to lab 

## 2016-08-25 NOTE — MAU Note (Signed)
Pt C/O decreased FM today, was seen in MAU on Sunday for same thing.  Has felt 3 movements in last 2 hours.  Has mild contractions, denies bleeding or LOF.

## 2016-08-25 NOTE — MAU Provider Note (Signed)
None     Chief Complaint:  Decreased Fetal Movement   Tara Mcmahon is  20 y.o. [phone] at [redacted]w[redacted]d presents complaining of Decreased Fetal Movement .She has been to the MAU twice this week for the same complaint.  Audible FM on EFM is not felt by pt. She is very very anxious/nervous about fetal well being.   Obstetrical/Gynecological History: OB History    Gravida Para Term Preterm AB Living   3 1   1 1 1    SAB TAB Ectopic Multiple Live Births   1       1     Past Medical History: Past Medical History:  Diagnosis Date  . Anxiety   . Cervical incompetence during pregnancy   . Depression   . Hypothyroidism   . PCOS (polycystic ovarian syndrome)     Past Surgical History: Past Surgical History:  Procedure Laterality Date  . CERVICAL CERCLAGE    . CERVICAL CERCLAGE N/A 03/09/2016   Procedure: [facility] CERCLAGE PLACEMENT;  Surgeon: Lazaro Arms, MD;  Location: [facility];  Service: Gynecology;  Laterality: N/A;  . labial reduction      Family History: Family History  Problem Relation Age of Onset  . Arthritis Mother   . Lupus Mother   . Autoimmune disease Mother   . Cancer Maternal Grandmother     breast  . Heart disease Maternal Grandfather   . Heart disease Paternal Grandfather   . Hypertension Paternal Grandfather     Social History: Social History  Substance Use Topics  . Smoking status: Never Smoker  . Smokeless tobacco: Never Used  . Alcohol use No    Allergies: No Known Allergies  Meds:  Prescriptions Prior to Admission  Medication Sig Dispense Refill Last Dose  . calcium carbonate (TUMS - DOSED IN MG ELEMENTAL CALCIUM) 500 MG chewable tablet Chew 1 tablet by mouth 5 (five) times daily as needed for indigestion or heartburn.   08/24/2016 at Unknown time  . levothyroxine (SYNTHROID) 50 MCG tablet Take 1 tablet (50 mcg total) by mouth daily before breakfast. 30 tablet 3 Past Week at Unknown time  . Prenatal Vit-Fe Fumarate-FA (PRENATAL MULTIVITAMIN) TABS  tablet Take 1 tablet by mouth at bedtime.    08/24/2016 at Unknown time  . omeprazole (PRILOSEC) 20 MG capsule Take 1 capsule (20 mg total) by mouth daily. 1 tablet a day (Patient not taking: Reported on 08/25/2016) 30 capsule 6 Not Taking at Unknown time    Review of Systems   Constitutional: Negative for fever and chills Eyes: Negative for visual disturbances Respiratory: Negative for shortness of breath, dyspnea Cardiovascular: Negative for chest pain or palpitations  Gastrointestinal: Negative for vomiting, diarrhea and constipation Genitourinary: Negative for dysuria and urgency Musculoskeletal: Negative for back pain, joint pain, myalgias.  Normal ROM  Neurological: Negative for dizziness and headaches    Physical Exam  Blood pressure 135/83, pulse 102, temperature 98 F (36.7 C), temperature source Oral, resp. rate 18, last menstrual period 10/31/2015. GENERAL: Well-developed, well-nourished female in no acute distress.  LUNGS: Clear to auscultation bilaterally.  HEART: Regular rate and rhythm. ABDOMEN: Soft, nontender, nondistended, gravid.  EXTREMITIES: Nontender, no edema, 2+ distal pulses. DTR's 2+ FHT:  Baseline rate 145 bpm   Variability moderate  Accelerations absent   Decelerations none Contractions: Every 0 mins   Labs: No results found for this or any previous visit (from the past 24 hour(s)). Imaging Studies:  No results found.  Assessment: Tara Mcmahon is  20 y.o.  [id number] at 2861w5d presents with reactive NST.  Plan: DC home.  Has appt tomorrow w/JVF.  US ordered for BPP/dopplers d/t persistent decreased FM (pt to come at 11:30).   CRESENZO-DISHMAN,Tara Mcmahon 9/28/20177:46 PM

## 2016-08-26 ENCOUNTER — Ambulatory Visit (INDEPENDENT_AMBULATORY_CARE_PROVIDER_SITE_OTHER): Payer: Medicaid Other | Admitting: Obstetrics and Gynecology

## 2016-08-26 ENCOUNTER — Other Ambulatory Visit: Payer: Self-pay | Admitting: Obstetrics and Gynecology

## 2016-08-26 ENCOUNTER — Encounter: Payer: Self-pay | Admitting: Obstetrics and Gynecology

## 2016-08-26 ENCOUNTER — Ambulatory Visit (INDEPENDENT_AMBULATORY_CARE_PROVIDER_SITE_OTHER): Payer: Medicaid Other

## 2016-08-26 VITALS — BP 110/80 | HR 84 | Wt 194.4 lb

## 2016-08-26 DIAGNOSIS — Z3A39 39 weeks gestation of pregnancy: Secondary | ICD-10-CM | POA: Diagnosis not present

## 2016-08-26 DIAGNOSIS — Z1389 Encounter for screening for other disorder: Secondary | ICD-10-CM

## 2016-08-26 DIAGNOSIS — Z331 Pregnant state, incidental: Secondary | ICD-10-CM | POA: Diagnosis not present

## 2016-08-26 DIAGNOSIS — O09893 Supervision of other high risk pregnancies, third trimester: Secondary | ICD-10-CM | POA: Diagnosis not present

## 2016-08-26 DIAGNOSIS — O368130 Decreased fetal movements, third trimester, not applicable or unspecified: Secondary | ICD-10-CM

## 2016-08-26 DIAGNOSIS — Z3493 Encounter for supervision of normal pregnancy, unspecified, third trimester: Secondary | ICD-10-CM

## 2016-08-26 LAB — POCT URINALYSIS DIPSTICK
Glucose, UA: NEGATIVE
Ketones, UA: NEGATIVE
Leukocytes, UA: NEGATIVE
NITRITE UA: NEGATIVE
Protein, UA: NEGATIVE
RBC UA: NEGATIVE

## 2016-08-26 NOTE — Progress Notes (Addendum)
Tara Mcmahon is a 20 y.o. female  G3P0111  Estimated Date of Delivery: 09/03/16 LROB [redacted]w[redacted]d  Blood pressure 110/80, pulse 84, weight 194 lb 6.4 oz (88.2 kg), last menstrual period 10/31/2015.   Urine results:notable for negative  Chief Complaint  Patient presents with  . Routine Prenatal Visit    ultrasound/ c/o vaginal discharge and odor.  Patient is a self described "nervous Nellie" Tara Mcmahon is very frightened as she is reach this point in the pregnancy, successfully, very much worried that something that'll happen. She's been unable to identify fetal movement twice over the last week and has been seen at [facility] in the MAU 2 with reactive NSTs Patient complaints: abnormal vaginal discharge and  odor which she notices after washing.  Patient reports dereased fetal movement for a few days but notes she does feel movement. She was recently admitted at [facility] for decreased fetal movement; NSTs were reactive. She denies any bleeding, rupture of membranes,or regular contractions.   refer to the ob flow sheet for FH and FHR, please review the ultrasound which is notable for normal growth 61 percentile with normal fluid volume and BPP 8 out of 8. The placenta is graded as a grade 3 placenta, and resistance index is at the 95th percentile which is a little bit of a concern. This is discussed with maternal fetal medicine, combined with  the question over decrease  decreased fetal movement ,                          Physical Examination: General appearance - alert, well appearing, and in no distress and oriented to person, place, and time                                      Abdomen - FH 38 ,                                                         -FHR 135                                                         soft, nontender                                      Pelvic - normal external genitalia, vulva, vagina, cervix, uterus and adnexa; Cervix 2 cm dilation, long, anterior, somewhat  favorable                                            Questions were answered. Assessment: LROB [id number] @ [redacted]w[redacted]d, no contractions  12:45 PM Discussed case with maternal fetal medicine who recommends picking induction for persistent decreased fetal movement As schedule permits, after 39 weeks.   Plan:  Induction past 39 weeks. Pt is apprehensive regarding Cytotec, requesting foley bulb instead of  Cytotec  F/u in 4 weeks for post partum visit  By signing my name below, I, Tara Mcmahon, attest that this documentation has been prepared under the direction and in the presence of Tara BurrowJohn Mcmahon Kealii Thueson, MD . Electronically Signed: Freida Busmaniana Mcmahon, Scribe. 08/26/2016. 12:49 PM. I personally performed the services described in this documentation, which was SCRIBED in my presence. The recorded information has been reviewed and considered accurate. It has been edited as necessary during review. Tara Mcmahon,Tara Rood V, MD The provider spent over 25 minutes with the visit , including previsit review, and documentation,with >than 50% spent in counseling and coordination of care.

## 2016-08-26 NOTE — H&P (Signed)
Tara Mcmahon is a 20 y.o. female G3P0111  AT 27 W 2 D as of Monday presenting for induction of labor for persistent decreased fetal movements . She is in same the MAU twice in the past week complaining of decreased fetal movement has had reactive NSTs. She was seen in the office at [facility] 08/26/2016 were BPP was 8 out of 8 with normal fluid volume of course and Doppler flow studies were performed which showed resistance index elevated at the 95th percentile for gestational age. She still says that she can't feel any fetal movement and so after discussion with maternal fetal medicine on call the recommendation was received to proceed with induction after she reaches 39 weeks.. Prenatal course has been notable for a prophylactic cerclage which was removed at 35 weeks when she presented complaining of contractions she's nonetheless had no evidence of preterm labor course. Cerclage was history indicated that she delivered her first infant at 26 weeks after awakening feeling contractions and was 6 cm dilated upon arrival the baby did well OB History    Gravida Para Term Preterm AB Living   3 1   1 1 1    SAB TAB Ectopic Multiple Live Births   1       1     Past Medical History:  Diagnosis Date  . Anxiety   . Cervical incompetence during pregnancy   . Depression   . Hypothyroidism   . PCOS (polycystic ovarian syndrome)    Past Surgical History:  Procedure Laterality Date  . CERVICAL CERCLAGE    . CERVICAL CERCLAGE N/A 03/09/2016   Procedure: [facility] CERCLAGE PLACEMENT;  Surgeon: Lazaro Arms, MD;  Location: [facility];  Service: Gynecology;  Laterality: N/A;  . labial reduction     Family History: family history includes Arthritis in her mother; Autoimmune disease in her mother; Cancer in her maternal grandmother; Heart disease in her maternal grandfather and paternal grandfather; Hypertension in her paternal grandfather; Lupus in her mother. Social History:  reports that she has  never smoked. She has never used smokeless tobacco. She reports that she does not drink alcohol or use drugs.     Maternal Diabetes: No Genetic Screening: Normal Maternal Ultrasounds/Referrals: Normal Fetal Ultrasounds or other Referrals:  None Maternal Substance Abuse:  No Significant Maternal Medications:  Meds include: Other:  Synthroid 50 g daily Significant Maternal Lab Results:  Lab values include: Group B Strep negative Other Comments:  None  ROS History see history of present illness   Last menstrual period 10/31/2015. Exam Physical Exam  Constitutional: She appears well-developed and well-nourished.  HENT:  Head: Normocephalic.  Eyes: Pupils are equal, round, and reactive to light.  Neck: Normal range of motion.  Cardiovascular: Normal rate and regular rhythm.   Respiratory: Effort normal.  GI: Soft.  Gravid uterus consistent with dates estimated fetal weight 61st percentile on ultrasound 08/26/2016. Additionally ultrasound showed normal fluid volume vertex presentation and BPP 8 out of 8. Doppler showed resistance index of 0.75, 95th percentile for gestational age   LMP 10/31/2015 height 4 feet 11 weight 203    Prenatal labs: ABO, Rh: --/--/O POS (09/02 2235) Antibody: NEG (09/02 2235) Rubella: 1.17 (03/01 0934) RPR: Non Reactive (09/02 2235)  HBsAg: Negative (03/01 0934)  HIV: Non Reactive (07/06 1033)  GBS: Negative (04/06 1500)   Assessment/Plan: Pregnancy 39 weeks 2 days, persistent recurrent decreased fetal movement with normal fetal testing This discussed with maternal fetal medicine will proceed with induction of  labor after 39 weeks   Shaira Sova V 08/26/2016, 5:21 PM

## 2016-08-26 NOTE — Progress Notes (Signed)
US 38+6 wks,cephalic,fhr 124 bpm,bilat adnexa's wnl,BPP 8/8,RI .74,.76, RI'S >95TH percentile,post pl gr 3,afi 23.4 cm,EFW 3482 g 61%

## 2016-08-29 ENCOUNTER — Inpatient Hospital Stay (HOSPITAL_COMMUNITY)
Admission: RE | Admit: 2016-08-29 | Discharge: 2016-09-01 | DRG: 775 | Disposition: A | Discharge status: Home / Self Care | Payer: Medicaid Other | Source: Ambulatory Visit | Attending: Obstetrics and Gynecology | Admitting: Obstetrics and Gynecology

## 2016-08-29 ENCOUNTER — Encounter (HOSPITAL_COMMUNITY): Payer: Self-pay

## 2016-08-29 DIAGNOSIS — E039 Hypothyroidism, unspecified: Secondary | ICD-10-CM | POA: Diagnosis present

## 2016-08-29 DIAGNOSIS — Z3A39 39 weeks gestation of pregnancy: Secondary | ICD-10-CM

## 2016-08-29 DIAGNOSIS — O99284 Endocrine, nutritional and metabolic diseases complicating childbirth: Secondary | ICD-10-CM | POA: Diagnosis present

## 2016-08-29 DIAGNOSIS — Z8249 Family history of ischemic heart disease and other diseases of the circulatory system: Secondary | ICD-10-CM | POA: Diagnosis not present

## 2016-08-29 DIAGNOSIS — O3433 Maternal care for cervical incompetence, third trimester: Secondary | ICD-10-CM | POA: Diagnosis present

## 2016-08-29 DIAGNOSIS — O134 Gestational [pregnancy-induced] hypertension without significant proteinuria, complicating childbirth: Secondary | ICD-10-CM | POA: Diagnosis not present

## 2016-08-29 DIAGNOSIS — O139 Gestational [pregnancy-induced] hypertension without significant proteinuria, unspecified trimester: Secondary | ICD-10-CM | POA: Diagnosis present

## 2016-08-29 DIAGNOSIS — O368130 Decreased fetal movements, third trimester, not applicable or unspecified: Secondary | ICD-10-CM | POA: Diagnosis present

## 2016-08-29 DIAGNOSIS — O368190 Decreased fetal movements, unspecified trimester, not applicable or unspecified: Secondary | ICD-10-CM | POA: Diagnosis present

## 2016-08-29 LAB — COMPREHENSIVE METABOLIC PANEL
ALK PHOS: 185 U/L — AB (ref 38–126)
ALT: 15 U/L (ref 14–54)
AST: 19 U/L (ref 15–41)
Albumin: 2.6 g/dL — ABNORMAL LOW (ref 3.5–5.0)
Anion gap: 10 (ref 5–15)
BILIRUBIN TOTAL: 0.4 mg/dL (ref 0.3–1.2)
BUN: 9 mg/dL (ref 6–20)
CALCIUM: 9.2 mg/dL (ref 8.9–10.3)
CO2: 21 mmol/L — AB (ref 22–32)
CREATININE: 0.56 mg/dL (ref 0.44–1.00)
Chloride: 104 mmol/L (ref 101–111)
Glucose, Bld: 104 mg/dL — ABNORMAL HIGH (ref 65–99)
Potassium: 4 mmol/L (ref 3.5–5.1)
SODIUM: 135 mmol/L (ref 135–145)
TOTAL PROTEIN: 6.4 g/dL — AB (ref 6.5–8.1)

## 2016-08-29 LAB — TYPE AND SCREEN
ABO/RH(D): O POS
Antibody Screen: NEGATIVE

## 2016-08-29 LAB — CBC
HCT: 35.1 % — ABNORMAL LOW (ref 36.0–46.0)
Hemoglobin: 12.2 g/dL (ref 12.0–15.0)
MCH: 29.1 pg (ref 26.0–34.0)
MCHC: 34.8 g/dL (ref 30.0–36.0)
MCV: 83.8 fL (ref 78.0–100.0)
PLATELETS: 323 10*3/uL (ref 150–400)
RBC: 4.19 MIL/uL (ref 3.87–5.11)
RDW: 12.9 % (ref 11.5–15.5)
WBC: 16.1 10*3/uL — ABNORMAL HIGH (ref 4.0–10.5)

## 2016-08-29 LAB — PROTEIN / CREATININE RATIO, URINE
Creatinine, Urine: 106 mg/dL
PROTEIN CREATININE RATIO: 0.13 mg/mg{creat} (ref 0.00–0.15)
TOTAL PROTEIN, URINE: 14 mg/dL

## 2016-08-29 LAB — RPR: RPR: NONREACTIVE

## 2016-08-29 MED ORDER — OXYTOCIN BOLUS FROM INFUSION
500.0000 mL | Freq: Once | INTRAVENOUS | Status: AC
  Administered 2016-08-30: 500 mL via INTRAVENOUS

## 2016-08-29 MED ORDER — INFLUENZA VAC SPLIT QUAD 0.5 ML IM SUSY
0.5000 mL | PREFILLED_SYRINGE | INTRAMUSCULAR | Status: DC
  Filled 2016-08-29: qty 0.5

## 2016-08-29 MED ORDER — OXYTOCIN 40 UNITS IN LACTATED RINGERS INFUSION - SIMPLE MED
1.0000 m[IU]/min | INTRAVENOUS | Status: DC
  Administered 2016-08-29: 2 m[IU]/min via INTRAVENOUS

## 2016-08-29 MED ORDER — FAMOTIDINE 20 MG PO TABS
20.0000 mg | ORAL_TABLET | Freq: Once | ORAL | Status: AC
  Administered 2016-08-29: 20 mg via ORAL
  Filled 2016-08-29: qty 1

## 2016-08-29 MED ORDER — OXYTOCIN 40 UNITS IN LACTATED RINGERS INFUSION - SIMPLE MED
1.0000 m[IU]/min | INTRAVENOUS | Status: DC
  Filled 2016-08-29: qty 1000

## 2016-08-29 MED ORDER — LIDOCAINE HCL (PF) 1 % IJ SOLN
30.0000 mL | INTRAMUSCULAR | Status: DC | PRN
  Administered 2016-08-30: 30 mL via SUBCUTANEOUS
  Filled 2016-08-29: qty 30

## 2016-08-29 MED ORDER — FENTANYL CITRATE (PF) 100 MCG/2ML IJ SOLN: 100.0000 ug | INTRAMUSCULAR | Status: DC | PRN

## 2016-08-29 MED ORDER — ACETAMINOPHEN 325 MG PO TABS: 650.0000 mg | ORAL_TABLET | ORAL | Status: DC | PRN

## 2016-08-29 MED ORDER — FENTANYL 2.5 MCG/ML BUPIVACAINE 1/10 % EPIDURAL INFUSION (WH - ANES)
14.0000 mL/h | INTRAMUSCULAR | Status: DC | PRN
  Administered 2016-08-30 (×4): 14 mL/h via EPIDURAL
  Filled 2016-08-29 (×3): qty 125

## 2016-08-29 MED ORDER — OXYCODONE-ACETAMINOPHEN 5-325 MG PO TABS: 2.0000 | ORAL_TABLET | ORAL | Status: DC | PRN

## 2016-08-29 MED ORDER — EPHEDRINE 5 MG/ML INJ
10.0000 mg | INTRAVENOUS | Status: DC | PRN
  Filled 2016-08-29: qty 4

## 2016-08-29 MED ORDER — SOD CITRATE-CITRIC ACID 500-334 MG/5ML PO SOLN
30.0000 mL | ORAL | Status: DC | PRN
  Administered 2016-08-29: 30 mL via ORAL
  Filled 2016-08-29: qty 15

## 2016-08-29 MED ORDER — FLEET ENEMA 7-19 GM/118ML RE ENEM: 1.0000 | ENEMA | RECTAL | Status: DC | PRN

## 2016-08-29 MED ORDER — RANITIDINE HCL 150 MG/10ML PO SYRP: 150.0000 mg | ORAL_SOLUTION | Freq: Once | ORAL | Status: DC

## 2016-08-29 MED ORDER — LACTATED RINGERS IV SOLN
500.0000 mL | Freq: Once | INTRAVENOUS | Status: AC
  Administered 2016-08-30: 500 mL via INTRAVENOUS

## 2016-08-29 MED ORDER — ONDANSETRON HCL 4 MG/2ML IJ SOLN
4.0000 mg | Freq: Four times a day (QID) | INTRAMUSCULAR | Status: DC | PRN
  Administered 2016-08-30 (×2): 4 mg via INTRAVENOUS
  Filled 2016-08-29 (×2): qty 2

## 2016-08-29 MED ORDER — TERBUTALINE SULFATE 1 MG/ML IJ SOLN
0.2500 mg | Freq: Once | INTRAMUSCULAR | Status: DC | PRN
  Filled 2016-08-29: qty 1

## 2016-08-29 MED ORDER — CALCIUM CARBONATE ANTACID 500 MG PO CHEW: 1.0000 | CHEWABLE_TABLET | Freq: Every day | ORAL | Status: DC

## 2016-08-29 MED ORDER — PHENYLEPHRINE 40 MCG/ML (10ML) SYRINGE FOR IV PUSH (FOR BLOOD PRESSURE SUPPORT)
80.0000 ug | PREFILLED_SYRINGE | INTRAVENOUS | Status: DC | PRN
  Filled 2016-08-29: qty 5

## 2016-08-29 MED ORDER — DIPHENHYDRAMINE HCL 50 MG/ML IJ SOLN: 12.5000 mg | INTRAMUSCULAR | Status: DC | PRN

## 2016-08-29 MED ORDER — LACTATED RINGERS IV SOLN
INTRAVENOUS | Status: DC
Start: 2016-08-29 — End: 2016-08-31
  Administered 2016-08-29 – 2016-08-30 (×5): via INTRAVENOUS

## 2016-08-29 MED ORDER — OXYTOCIN 40 UNITS IN LACTATED RINGERS INFUSION - SIMPLE MED: 2.5000 [IU]/h | INTRAVENOUS | Status: DC

## 2016-08-29 MED ORDER — OXYCODONE-ACETAMINOPHEN 5-325 MG PO TABS: 1.0000 | ORAL_TABLET | ORAL | Status: DC | PRN

## 2016-08-29 MED ORDER — LACTATED RINGERS IV SOLN
500.0000 mL | INTRAVENOUS | Status: DC | PRN
  Administered 2016-08-30: 250 mL via INTRAVENOUS
  Administered 2016-08-30: 500 mL via INTRAVENOUS

## 2016-08-29 MED ORDER — PHENYLEPHRINE 40 MCG/ML (10ML) SYRINGE FOR IV PUSH (FOR BLOOD PRESSURE SUPPORT)
80.0000 ug | PREFILLED_SYRINGE | INTRAVENOUS | Status: DC | PRN
  Filled 2016-08-29: qty 10
  Filled 2016-08-29: qty 5

## 2016-08-29 NOTE — H&P (Signed)
Patient ID: Tara Mcmahon, female   DOB: 1996-11-13, 20 y.o.   MRN: [id number]  LABOR AND DELIVERY ADMISSION HISTORY AND PHYSICAL NOTE  Tara Mcmahon is a 20 y.o. female [id number] with IUP at [redacted]w[redacted]d by LMP presenting for IOL for gHTN and decreased fetal movement(MFM recommended induction at approximately 39wks).  She reports positive fetal movement. She denies leakage of fluid or vaginal bleeding.  Prenatal History/Complications:  Past Medical History: Past Medical History:  Diagnosis Date  . Anxiety   . Cervical incompetence during pregnancy   . Depression   . Hypothyroidism   . PCOS (polycystic ovarian syndrome)     Past Surgical History: Past Surgical History:  Procedure Laterality Date  . CERVICAL CERCLAGE    . CERVICAL CERCLAGE N/A 03/09/2016   Procedure: [facility] CERCLAGE PLACEMENT;  Surgeon: Lazaro Arms, MD;  Location: [facility];  Service: Gynecology;  Laterality: N/A;  . labial reduction      Obstetrical History: OB History    Gravida Para Term Preterm AB Living   3 1   1 1 1    SAB TAB Ectopic Multiple Live Births   1       1      Social History: Social History   Social History  . Marital status: Married    Spouse name: N/A  . Number of children: N/A  . Years of education: N/A   Social History Main Topics  . Smoking status: Never Smoker  . Smokeless tobacco: Never Used  . Alcohol use No  . Drug use: No  . Sexual activity: Not Currently    Birth control/ protection: None   Other Topics Concern  . None   Social History Narrative  . None    Family History: Family History  Problem Relation Age of Onset  . Arthritis Mother   . Lupus Mother   . Autoimmune disease Mother   . Cancer Maternal Grandmother     breast  . Heart disease Maternal Grandfather   . Heart disease Paternal Grandfather   . Hypertension Paternal Grandfather     Allergies: No Known Allergies  Prescriptions Prior to Admission  Medication Sig Dispense Refill Last Dose  .  calcium carbonate (TUMS - DOSED IN MG ELEMENTAL CALCIUM) 500 MG chewable tablet Chew 1 tablet by mouth as needed for indigestion or heartburn.    08/28/2016 at Unknown time  . levothyroxine (SYNTHROID) 50 MCG tablet Take 1 tablet (50 mcg total) by mouth daily before breakfast. 30 tablet 3 Past Week at Unknown time  . Prenatal Vit-Fe Fumarate-FA (PRENATAL MULTIVITAMIN) TABS tablet Take 1 tablet by mouth at bedtime.    Past Week at Unknown time    Review of Systems   All systems reviewed and negative except as stated in HPI  Blood pressure 126/86, pulse (!) 115, temperature 98.2 F (36.8 C), temperature source Oral, resp. rate 18, height 4\' 11"  (1.499 m), weight 88.2 kg (194 lb 6.4 oz), last menstrual period 10/31/2015. General appearance: alert, cooperative and no distress Lungs: no respiratory distress Heart: regular rate Abdomen: soft, non-tender. Gravid uterus consistent with dates estimated fetal weight 61st percentile on ultrasound 08/26/2016. Additionally ultrasound showed normal fluid volume vertex presentation and BPP 8 out of 8. Doppler showed resistance index of 0.75, 95th percentile for gestational age  Extremities: No calf swelling or tenderness Presentation: cephalic by ultrasound Fetal monitoring: baseline 125, moderate variability, +accels, no decels Uterine activity: q10 min irreg contractions    Prenatal labs: ABO, Rh: --/--/O POS (10/02  0740) Antibody: NEG (10/02 0740) Rubella: 1.17 (03/01 0934) RPR: Non Reactive (09/02 2235)  HBsAg: Negative (03/01 0934)  HIV: Non Reactive (07/06 1033)  GBS: Negative (04/06 1500)    Prenatal Transfer Tool  Maternal Diabetes: No Genetic Screening: Normal Maternal Ultrasounds/Referrals: Normal Fetal Ultrasounds or other Referrals:  None Maternal Substance Abuse:  No Significant Maternal Medications:  Meds include: Syntroid 50 uq daily Significant Maternal Lab Results: Lab values include: Group B Strep negative  Results for  orders placed or performed during the hospital encounter of 08/29/16 (from the past 24 hour(s))  CBC   Collection Time: 08/29/16  7:40 AM  Result Value Ref Range   WBC 16.1 (H) 4.0 - 10.5 K/uL   RBC 4.19 3.87 - 5.11 MIL/uL   Hemoglobin 12.2 12.0 - 15.0 g/dL   HCT 16.135.1 (L) 09.636.0 - 04.546.0 %   MCV 83.8 78.0 - 100.0 fL   MCH 29.1 26.0 - 34.0 pg   MCHC 34.8 30.0 - 36.0 g/dL   RDW 40.912.9 81.111.5 - 91.415.5 %   Platelets 323 150 - 400 K/uL  Comprehensive metabolic panel   Collection Time: 08/29/16  7:40 AM  Result Value Ref Range   Sodium 135 135 - 145 mmol/L   Potassium 4.0 3.5 - 5.1 mmol/L   Chloride 104 101 - 111 mmol/L   CO2 21 (L) 22 - 32 mmol/L   Glucose, Bld 104 (H) 65 - 99 mg/dL   BUN 9 6 - 20 mg/dL   Creatinine, Ser 7.820.56 0.44 - 1.00 mg/dL   Calcium 9.2 8.9 - 95.610.3 mg/dL   Total Protein 6.4 (L) 6.5 - 8.1 g/dL   Albumin 2.6 (L) 3.5 - 5.0 g/dL   AST 19 15 - 41 U/L   ALT 15 14 - 54 U/L   Alkaline Phosphatase 185 (H) 38 - 126 U/L   Total Bilirubin 0.4 0.3 - 1.2 mg/dL   GFR calc non Af Amer >60 >60 mL/min   GFR calc Af Amer >60 >60 mL/min   Anion gap 10 5 - 15  Type and screen [facility]   Collection Time: 08/29/16  7:40 AM  Result Value Ref Range   ABO/RH(D) O POS    Antibody Screen NEG    Sample Expiration 09/01/2016     Patient Active Problem List   Diagnosis Date Noted  . Decreased fetal movement affecting management of mother, antepartum 08/29/2016  . Active preterm labor 07/30/2016  . Preterm contractions 07/18/2016  . UTI (urinary tract infection) during pregnancy 03/30/2016  . Subclinical hypothyroidism 02/04/2016  . History of suicidal ideation 02/03/2016  . Supervision of normal pregnancy 01/27/2016  . History of postpartum depression, currently pregnant in third trimester 01/27/2016  . PCOS (polycystic ovarian syndrome) 01/27/2016  . Depression with anxiety 01/27/2016  . Family history of systemic lupus erythematosus (SLE) in mother 01/27/2016     Assessment: Tara Mcmahon is a 20 y.o. [id number] at 978w2d here for IOL for gHTN and decreased fetal movement.  #Labor: Foley bulb and pitocin  #Pain: Epidural #FWB:  Category I #ID:  GBS negative #MOF: breast #MOC: nexplanon #Circ:  n/a  Ernestina PennaNicholas Tabetha Haraway  10/2/20179:43 AM

## 2016-08-29 NOTE — Progress Notes (Signed)
LABOR PROGRESS NOTE  Tara Mcmahon is a 20 y.o. [id number] at 2960w2d  admitted for IOL for gHTN.  Subjective: Doing well. States she is having a little discomfort but not intense pain.  Objective: BP 139/89   Pulse 94   Temp 98.4 F (36.9 C) (Oral)   Resp 16   Ht 4\' 11"  (1.499 m)   Wt 88.2 kg (194 lb 6.4 oz)   LMP 10/31/2015   BMI 39.26 kg/m  or  Vitals:   08/29/16 2100 08/29/16 2101 08/29/16 2133 08/29/16 2205  BP: 125/73 125/73 123/79 139/89  Pulse: 91 91 97 94  Resp: 16 16 16 16   Temp:      TempSrc:      Weight:      Height:        Dilation: 5 Effacement (%): 50 Cervical Position: Anterior Station: -2 Presentation: Vertex Exam by:: Alfonzo BeersMolly Trott, RN   Labs: Lab Results  Component Value Date   WBC 16.1 (H) 08/29/2016   HGB 12.2 08/29/2016   HCT 35.1 (L) 08/29/2016   MCV 83.8 08/29/2016   PLT 323 08/29/2016    Patient Active Problem List   Diagnosis Date Noted  . Decreased fetal movement affecting management of mother, antepartum 08/29/2016  . Gestational hypertension 08/29/2016  . UTI (urinary tract infection) during pregnancy 03/30/2016  . Subclinical hypothyroidism 02/04/2016  . History of suicidal ideation 02/03/2016  . Supervision of normal pregnancy 01/27/2016  . History of postpartum depression, currently pregnant in third trimester 01/27/2016  . PCOS (polycystic ovarian syndrome) 01/27/2016  . Depression with anxiety 01/27/2016  . Family history of systemic lupus erythematosus (SLE) in mother 01/27/2016    Assessment / Plan: 20 y.o. [id number] at 3660w2d here for IOL for gHTN  Labor: Progressing normally on Pitocin. S/p Foley. Fetal Wellbeing: Category II- has had a few intermittent decels Pain Control:  Fentanyl for now, planning epidural Anticipated MOD:  SVD  Hilton SinclairKaty D Zahira Brummond, MD 08/29/2016, 10:26 PM

## 2016-08-29 NOTE — Progress Notes (Signed)
Pt evaluated. Foley bulb out. 4cm, category 1 tracing. Continue pitcin, may increas 2x2 at this time. Continue expect vaginal delivery.

## 2016-08-29 NOTE — Anesthesia Pain Management Evaluation Note (Signed)
  CRNA Pain Management Visit Note  Patient: Tara Mcmahon, 20 y.o., female  "Hello I am a [facility] the [facility]. We have an anesthesia team available at all times to provide care throughout the hospital, including epidural management and anesthesia for C-section. I don't know your plan for the delivery whether it a natural birth, water birth, IV sedation, nitrous supplementation, doula or epidural, but we want to meet your pain goals."   1.Was your pain managed to your expectations on prior hospitalizations?   Yes   2.What is your expectation for pain management during this hospitalization?     Epidural and IV pain meds  3.How can we help you reach that goal? Possible IV medication, possible epidural placement  Record the patient's initial score and the patient's pain goal.   Pain: 0  Pain Goal: 0-10 [facility] wants you to be able to say your pain was always managed very well.  [facility]EIGHT,Aleyah Balik 08/29/2016

## 2016-08-29 NOTE — Anesthesia Pain Management Evaluation Note (Signed)
  CRNA Pain Management Visit Note  Patient: Tara Mcmahon, 20 y.o., female  "Hello I am a [facility] of the [facility]. We have an anesthesia team available at all times to provide care throughout the hospital, including epidural management and anesthesia for C-section. I don't know your plan for the delivery whether it a natural birth, water birth, IV sedation, nitrous supplementation, doula or epidural, but we want to meet your pain goals."   1.Was your pain managed to your expectations on prior hospitalizations?   Yes   2.What is your expectation for pain management during this hospitalization?     IV pain meds and Nitrous Oxide  3.How can we help you reach that goal? Supportive nursing measures, Nitrous oxide and IV medications  Record the patient's initial score and the patient's pain goal.   Pain: 3  Pain Goal: 3 [facility] wants you to be able to say your pain was always managed very well.  [facility],Danford Tat 08/29/2016

## 2016-08-30 ENCOUNTER — Inpatient Hospital Stay (HOSPITAL_COMMUNITY): Payer: Medicaid Other | Admitting: Anesthesiology

## 2016-08-30 ENCOUNTER — Encounter (HOSPITAL_COMMUNITY): Payer: Self-pay

## 2016-08-30 DIAGNOSIS — O134 Gestational [pregnancy-induced] hypertension without significant proteinuria, complicating childbirth: Secondary | ICD-10-CM

## 2016-08-30 DIAGNOSIS — Z3A39 39 weeks gestation of pregnancy: Secondary | ICD-10-CM

## 2016-08-30 DIAGNOSIS — O3433 Maternal care for cervical incompetence, third trimester: Secondary | ICD-10-CM

## 2016-08-30 DIAGNOSIS — O368130 Decreased fetal movements, third trimester, not applicable or unspecified: Secondary | ICD-10-CM

## 2016-08-30 DIAGNOSIS — O99284 Endocrine, nutritional and metabolic diseases complicating childbirth: Secondary | ICD-10-CM

## 2016-08-30 MED ORDER — IBUPROFEN 600 MG PO TABS
600.0000 mg | ORAL_TABLET | Freq: Four times a day (QID) | ORAL | Status: DC
  Administered 2016-08-30 – 2016-09-01 (×6): 600 mg via ORAL
  Filled 2016-08-30 (×6): qty 1

## 2016-08-30 MED ORDER — LIDOCAINE HCL (PF) 1 % IJ SOLN
INTRAMUSCULAR | Status: DC | PRN
  Administered 2016-08-30 (×2): 5 mL

## 2016-08-30 MED ORDER — LIDOCAINE-EPINEPHRINE (PF) 2 %-1:200000 IJ SOLN
INTRAMUSCULAR | Status: DC | PRN
  Administered 2016-08-30: 5 mL via EPIDURAL

## 2016-08-30 MED ORDER — LACTATED RINGERS IV SOLN
INTRAVENOUS | Status: DC
  Administered 2016-08-30 (×3): via INTRAUTERINE

## 2016-08-30 NOTE — Progress Notes (Signed)
Patient ID: Tara Mcmahon, female   DOB: 1996/06/09, 20 y.o.   MRN: [id number]  S: Called to patient's room by RN due to concern for a ?presenting part? With the head. Patient is comfortable with epidural.  O:  Vitals:   08/30/16 1431 08/30/16 1501 08/30/16 1531 08/30/16 1601  BP: 107/65 103/60 (!) 107/58 120/75  Pulse: 92 (!) 101 94 (!) 125  Resp: 18 18 18 18   Temp:    99.9 F (37.7 C)  TempSrc:    Axillary  SpO2:      Weight:      Height:       Dilation: Lip/rim Effacement (%): 80 Cervical Position: Middle Station: 0 Presentation: Vertex Exam by:: dr Omer Jackmumaw  SVE: There is a 1.5cm round cyst on the right vaginal wall about 8 o'clock, this is NOT a fetal presenting part. 9-9.5cm dilated, 100% effaced, 0 station.  Light mec was noted on exam.  FHT: 135, mod var, +accels, no decels IUPC: q802min, adequate contractions, <200 MVU.  A/P: Recommend continuing current management and laboring down. Aware of 99.74F temperature, no fever as of now, and no fetal tachycardia.

## 2016-08-30 NOTE — Anesthesia Procedure Notes (Signed)
Epidural Patient location during procedure: OB  Staffing Anesthesiologist: Damarea Merkel Performed: anesthesiologist   Preanesthetic Checklist Completed: patient identified, site marked, surgical consent, pre-op evaluation, timeout performed, IV checked, risks and benefits discussed and monitors and equipment checked  Epidural Patient position: sitting Prep: DuraPrep Patient monitoring: heart rate, continuous pulse ox and blood pressure Approach: right paramedian Location: L3-L4 Injection technique: LOR saline  Needle:  Needle type: Tuohy  Needle gauge: 17 G Needle length: 9 cm and 9 Needle insertion depth: 7 cm Catheter type: closed end flexible Catheter size: 20 Guage Catheter at skin depth: 12 cm Test dose: negative  Assessment Events: blood not aspirated, injection not painful, no injection resistance, negative IV test and no paresthesia  Additional Notes Patient identified. Risks/Benefits/Options discussed with patient including but not limited to bleeding, infection, nerve damage, paralysis, failed block, incomplete pain control, headache, blood pressure changes, nausea, vomiting, reactions to medication both or allergic, itching and postpartum back pain. Confirmed with bedside nurse the patient's most recent platelet count. Confirmed with patient that they are not currently taking any anticoagulation, have any bleeding history or any family history of bleeding disorders. Patient expressed understanding and wished to proceed. All questions were answered. Sterile technique was used throughout the entire procedure. Please see nursing notes for vital signs. Test dose was given through epidural needle and negative prior to continuing to dose epidural or start infusion. Warning signs of high block given to the patient including shortness of breath, tingling/numbness in hands, complete motor block, or any concerning symptoms with instructions to call for help. Patient was given  instructions on fall risk and not to get out of bed. All questions and concerns addressed with instructions to call with any issues.     

## 2016-08-30 NOTE — Progress Notes (Signed)
  Labor Progress Note  Patient assessed, feels pressure but not constant. Denies pain, epidural in place. FHT category 1. Cervical exam checked at 2:40  Dilation: 6.5 Effacement (%): 80 Cervical Position: Middle Station: -1, 0 Presentation: Vertex Exam by:: Marcelino DusterMichelle, RN   Will continue to monitor, plan for SVD  Dolores PattyAngela Isidore Margraf, DO PGY-1, [facility] 08/30/2016 2:51 PM

## 2016-08-30 NOTE — Progress Notes (Signed)
Pt doing well comfortable with epdidural. AROM at this time. 6/50/-2. Category 1 tracing. Continue to monitor. Pt with some emsis after AROM. Will continue to plan for SVD.

## 2016-08-30 NOTE — Anesthesia Preprocedure Evaluation (Signed)

## 2016-08-30 NOTE — Progress Notes (Signed)
Patient ID: Tara Mcmahon, female   DOB: March 27, 1996, 20 y.o.   MRN: [id number]   S: Called to patient's room due to frequent variable decelerations during contractions. Patient is comfortable with epidural. RN has been performing positional changes without resolve.  O:  Vitals:   08/30/16 0925 08/30/16 0930  BP:  (!) 105/56  Pulse:  86  Resp: 18   Temp: 98.7 F (37.1 C)    Dilation: 6 Effacement (%): 80 Cervical Position: Anterior Station: -1 Presentation: Vertex Exam by:: Marcelino DusterMichelle, RN   FHT: 150, mod var, no accels, variable decels during contractions, with resolve to baseline after about 1 min, nadir to 70-80s.  TOCO: q455min   A/P: 20 y.o. [id number] 1623w3d, IOL for persistent decreased fetal movement and GHTN.Thomes Lolling.   Pit @ 18, discussed with RN, will decrease by half to 9.  IUPC placed, amnioinfusion started.  Will reevaluate.   Cleda ClarksElizabeth W. Neizan Debruhl, DO  OB Fellow [facility], [facility]

## 2016-08-31 DIAGNOSIS — O139 Gestational [pregnancy-induced] hypertension without significant proteinuria, unspecified trimester: Secondary | ICD-10-CM

## 2016-08-31 DIAGNOSIS — Z9889 Other specified postprocedural states: Secondary | ICD-10-CM

## 2016-08-31 MED ORDER — ONDANSETRON HCL 4 MG PO TABS: 4.0000 mg | ORAL_TABLET | ORAL | Status: DC | PRN

## 2016-08-31 MED ORDER — SERTRALINE HCL 50 MG PO TABS
50.0000 mg | ORAL_TABLET | Freq: Every day | ORAL | Status: DC
  Administered 2016-08-31 – 2016-09-01 (×2): 50 mg via ORAL
  Filled 2016-08-31 (×2): qty 1

## 2016-08-31 MED ORDER — ONDANSETRON HCL 4 MG/2ML IJ SOLN: 4.0000 mg | INTRAMUSCULAR | Status: DC | PRN

## 2016-08-31 MED ORDER — PRENATAL MULTIVITAMIN CH
1.0000 | ORAL_TABLET | Freq: Every day | ORAL | Status: DC
  Administered 2016-08-31: 1 via ORAL
  Filled 2016-08-31: qty 1

## 2016-08-31 MED ORDER — BENZOCAINE-MENTHOL 20-0.5 % EX AERO
1.0000 "application " | INHALATION_SPRAY | CUTANEOUS | Status: DC | PRN
  Filled 2016-08-31: qty 56

## 2016-08-31 MED ORDER — DIPHENHYDRAMINE HCL 25 MG PO CAPS: 25.0000 mg | ORAL_CAPSULE | Freq: Four times a day (QID) | ORAL | Status: DC | PRN

## 2016-08-31 MED ORDER — COCONUT OIL OIL: 1.0000 "application " | TOPICAL_OIL | Status: DC | PRN

## 2016-08-31 MED ORDER — SIMETHICONE 80 MG PO CHEW: 80.0000 mg | CHEWABLE_TABLET | ORAL | Status: DC | PRN

## 2016-08-31 MED ORDER — WITCH HAZEL-GLYCERIN EX PADS: 1.0000 "application " | MEDICATED_PAD | CUTANEOUS | Status: DC | PRN

## 2016-08-31 MED ORDER — TETANUS-DIPHTH-ACELL PERTUSSIS 5-2.5-18.5 LF-MCG/0.5 IM SUSP
0.5000 mL | Freq: Once | INTRAMUSCULAR | Status: AC
  Administered 2016-08-31: 0.5 mL via INTRAMUSCULAR

## 2016-08-31 MED ORDER — ZOLPIDEM TARTRATE 5 MG PO TABS: 5.0000 mg | ORAL_TABLET | Freq: Every evening | ORAL | Status: DC | PRN

## 2016-08-31 MED ORDER — ACETAMINOPHEN 325 MG PO TABS: 650.0000 mg | ORAL_TABLET | ORAL | Status: DC | PRN

## 2016-08-31 MED ORDER — SENNOSIDES-DOCUSATE SODIUM 8.6-50 MG PO TABS
2.0000 | ORAL_TABLET | ORAL | Status: DC
  Administered 2016-08-31 (×2): 2 via ORAL
  Filled 2016-08-31 (×2): qty 2

## 2016-08-31 MED ORDER — DIBUCAINE 1 % RE OINT: 1.0000 "application " | TOPICAL_OINTMENT | RECTAL | Status: DC | PRN

## 2016-08-31 NOTE — Progress Notes (Signed)
POSTPARTUM PROGRESS NOTE  Post Partum Day 1 Subjective:  Tara Mcmahon is a 20 y.o. [id number] 7733w3d s/p nsvd. No ha, vision change, upper abdominal pain, or sob.  No acute events overnight.  Pt denies problems with ambulating, voiding or po intake.  She denies nausea or vomiting.  Pain is well controlled.  She has had flatus. She has not had bowel movement.  Lochia Small.   Objective: Blood pressure 140/79, pulse (!) 109, temperature 98 F (36.7 C), temperature source Oral, resp. rate 18, height 4\' 11"  (1.499 m), weight 194 lb 6.4 oz (88.2 kg), last menstrual period 10/31/2015, SpO2 99 %, unknown if currently breastfeeding.  Physical Exam:  General: alert, cooperative and no distress Lochia:normal flow Chest: CTAB Heart: RRR no m/r/g Abdomen: +BS, soft, nontender,  Uterine Fundus: firm,  DVT Evaluation: No calf swelling or tenderness Extremities: trace edema   Recent Labs  08/29/16 0740  HGB 12.2  HCT 35.1*    Assessment/Plan:  ASSESSMENT: Tara Harpershleigh Casares is a 20 y.o. [id number] 4333w3d s/p nsvd, doing well. No s/s severe disease, bp wnl to mild elevation, no indication for antihypertensive or Mg. 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  Plan for discharge tomorrow   LOS: 2 days   Silvano Bilisoah B Wouk 08/31/2016, 1:09 PM

## 2016-08-31 NOTE — Progress Notes (Signed)
UR chart review completed.  

## 2016-08-31 NOTE — Progress Notes (Signed)
Patient ID: Tara Mcmahon, female   DOB: 31-Oct-1996, 20 y.o.   MRN: [id number] Post Partum Day 1  Subjective:  Tara Mcmahon is a 20 y.o. [id number] 3146w3d s/p SVD.  No acute events overnight.  Pt denies problems with ambulating, voiding or po intake. Endorses some soreness. Pain is well controlled.  She has had flatus. She has not had bowel movement.  Lochia minimal.  Plan for birth control is Nexplanon.  Method of Feeding: breastfeeding, going well.  Objective: BP 140/79 (BP Location: Left Arm)   Pulse (!) 109   Temp 98 F (36.7 C) (Oral)   Resp 18   Ht 4\' 11"  (1.499 m)   Wt 88.2 kg (194 lb 6.4 oz)   LMP 10/31/2015   SpO2 99%   Breastfeeding? Unknown   BMI 39.26 kg/m   Physical Exam:  General: alert, cooperative and no distress Lochia:normal flow Chest: CTAB Heart: RRR  Abdomen: +BS, soft, nontender Uterine Fundus: firm, below umbilicus  DVT Evaluation: No evidence of DVT seen on physical exam. Extremities: minimal edema   Recent Labs  08/29/16 0740  HGB 12.2  HCT 35.1*    Assessment/Plan:  ASSESSMENT: Tara Mcmahon is a 20 y.o. [id number] 6746w3d ppd #1 s/p NSVD doing well.   Plan for discharge tomorrow, Breastfeeding and Contraception Nexplanon   LOS: 2 days   Harvin HazelChristina Rizk 08/31/2016, 7:28 AM    OB FELLOW MEDICAL STUDENT NOTE ATTESTATION  I have seen and examined this patient. Note this is a Psychologist, occupationalmedical student note and as such does not necessarily reflect the patient's plan of care. Please see progress note for this date of service.    Jen MowElizabeth Charisa Twitty, DO OB Fellow 08/31/2016, 11:07 PM

## 2016-08-31 NOTE — Progress Notes (Signed)
Mom did not want dermablast or ice packs

## 2016-08-31 NOTE — Clinical Social Work Maternal (Signed)
  CLINICAL SOCIAL WORK MATERNAL/CHILD NOTE  Patient Details  Name: Tara Mcmahon MRN: [id number] Date of Birth: 03/19/1996  Date:  08/31/2016  Clinical Social Worker Initiating Note:  Laurey Arrow Date/ Time Initiated:  08/31/16/1219     Child's Name:  Tara Mcmahon   Legal Guardian:  Mother   Need for Interpreter:  None   Date of Referral:  08/31/16     Reason for Referral:  Behavioral Health Issues, including SI    Referral Source:  Central Nursery   Address:  [address] [address]  Phone number:  [phone]   Household Members:  Self, Minor Children, Spouse   Natural Supports (not living in the home):  Extended Family, Immediate Family, Neighbors, Spouse/significant other, Artist Supports: None   Employment: Unemployed   Type of Work:     Education:  Database administrator Resources:  Medicaid   Other Resources:      Cultural/Religious Considerations Which May Impact Care:  Per [employer], [name] is Engineer, manufacturing.  Strengths:  Ability to meet basic needs , Pediatrician chosen , Home prepared for child , Understanding of illness   Risk Factors/Current Problems:  Mental Health Concerns    Cognitive State:  Alert , Able to Concentrate , Insightful , Linear Thinking    Mood/Affect:  Bright , Happy , Comfortable , Interested    CSW Assessment:  CSW met with [name] to complete an assessment for hx of PDD, anxiety, and depression. FOB /Husband Tara Mcmahon) was visiting with [name] when Tara Mcmahon arrived.  [name] gave CSW permission to meet with [name] while FOB was present.  [name] was bonding with infant as evident by [name] engaging in infant massages. CSW inquired about [name]'s MH hx and [name] acknowledged a hx of PPD, anxiety, and depression.  [name] attributed [name]'s hx of PPD to [name]'s premature delivery and NICU admission.  CSW validated [name]'s thoughts and feelings about having a traumatic birth experience with a premature infant. [name]  communicated to CSW that [name] wants to be proactive and has requested OBGYN to initiate Zoloft while [name] is inpatient.  CSW applauded [name] for being proactive and reminded [name] of PPD signs and symptoms. CSW educated [name] of possible supports and interventions to decrease PPD.  CSW also encouraged [name] to seek medical attention if needed for increased signs and symptoms for PPD.   [name] expressed the [name] feels comfortable asking for help and will reach out to [name]'s OBGYN if help is warranted. CSW offered [name] outpatient counseling resources and [name] declined.  [name] provided [name] with CSW contact information and encouraged [name] to reach out to CSW if [name] had any questions or concerns.  CSW Plan/Description:  Patient/Family Education , No Further Intervention Required/No Barriers to Discharge   Laurey Arrow, MSW, LCSW Clinical Social Work [phone]   Dimple Nanas, LCSW 08/31/2016, 12:20 PM

## 2016-08-31 NOTE — Lactation Note (Signed)
This note was copied from a baby's chart. Lactation Consultation Note Mom pumped and bottle fed her first baby d/t in NICU at 26 weeks gest. Mom stated she had an abundance supply of milk then at 8 weeks her milk supply dried up. Mom has PCOS, facial hair, and wide space between breast. Encouraged mom to use DEBP while in hospital. And we can set one up for her to use. Mom doesn't have one at home. Encouraged to pump for stimulation. Mom stated that she may. Mom states she wants to exclusively BF. Educated about newborn behavior, STS, I&O, cluster feeding, supply and demand. Mom encouraged to feed baby 8-12 times/24 hours and with feeding cues. Referred to Baby and Me Book in Breastfeeding section Pg. 22-23 for position options and Proper latch demonstration. WH/LC brochure given w/resources, support groups and LC services. Patient Name: Tara Mcmahon ZOXWR'UToday's Date: 08/31/2016 Reason for consult: Initial assessment   Maternal Data Has patient been taught Hand Expression?: Yes Does the patient have breastfeeding experience prior to this delivery?: Yes  Feeding Feeding Type: Breast Fed Length of feed: 30 min  LATCH Score/Interventions Latch: Grasps breast easily, tongue down, lips flanged, rhythmical sucking. Intervention(s): Assist with latch  Audible Swallowing: Spontaneous and intermittent  Type of Nipple: Everted at rest and after stimulation  Comfort (Breast/Nipple): Soft / non-tender     Hold (Positioning): Assistance needed to correctly position infant at breast and maintain latch.  LATCH Score: 9  Lactation Tools Discussed/Used     Consult Status Consult Status: Follow-up Date: 08/31/16 Follow-up type: In-patient    Charyl DancerCARVER, Zamier Eggebrecht G 08/31/2016, 6:17 AM

## 2016-08-31 NOTE — Lactation Note (Signed)
This note was copied from a baby's chart. Lactation Consultation Note  Patient Name: Tara Mcmahon ONGEX'BToday's Date: 08/31/2016 Reason for consult: Follow-up assessment Baby at 22 hr of life. Mom was requesting help with latch, upon entry RN had already helped mom latch baby. Mom is concerned that baby will cue but then refuse to latch. She stated her L nipple "goes flat" when she tries to latch baby. She requested and was given a Harmony to help stimulate the nipple. Mom is aware of lactation services and will call as needed.   Maternal Data    Feeding Feeding Type: Breast Fed  LATCH Score/Interventions Latch: Repeated attempts needed to sustain latch, nipple held in mouth throughout feeding, stimulation needed to elicit sucking reflex.  Audible Swallowing: A few with stimulation  Type of Nipple: Everted at rest and after stimulation  Comfort (Breast/Nipple): Soft / non-tender     Hold (Positioning): Assistance needed to correctly position infant at breast and maintain latch.  LATCH Score: 7  Lactation Tools Discussed/Used Pump Review: Setup, frequency, and cleaning;Milk Storage Initiated by:: ES Date initiated:: 08/31/16   Consult Status Consult Status: Follow-up Date: 09/01/16 Follow-up type: In-patient    Rulon Eisenmengerlizabeth E Taeshawn Helfman 08/31/2016, 8:11 PM

## 2016-08-31 NOTE — Anesthesia Postprocedure Evaluation (Signed)
Anesthesia Post Note  Patient: Public house managerAshleigh Mcmahon  Procedure(s) Performed: * No procedures listed *  Patient location during evaluation: Mother Baby Anesthesia Type: Epidural Level of consciousness: awake and alert, oriented and patient cooperative Pain management: pain level controlled Vital Signs Assessment: post-procedure vital signs reviewed and stable Respiratory status: spontaneous breathing, nonlabored ventilation and respiratory function stable Cardiovascular status: stable Postop Assessment: no headache, no backache, patient able to bend at knees, no signs of nausea or vomiting and adequate PO intake Anesthetic complications: no     Last Vitals:  Vitals:   08/31/16 0130 08/31/16 0531  BP: 125/68 140/79  Pulse: (!) 112 (!) 109  Resp: 18 18  Temp: 36.7 C 36.7 C    Last Pain:  Vitals:   08/31/16 0534  TempSrc:   PainSc: 3    Pain Goal: Patients Stated Pain Goal: 4 (08/30/16 2216)  Current Pain Level: 3               Franklin Baumbach

## 2016-09-01 MED ORDER — INFLUENZA VAC SPLIT QUAD 0.5 ML IM SUSY
0.5000 mL | PREFILLED_SYRINGE | INTRAMUSCULAR | Status: AC
  Administered 2016-09-01: 0.5 mL via INTRAMUSCULAR

## 2016-09-01 MED ORDER — SERTRALINE HCL 50 MG PO TABS
50.0000 mg | ORAL_TABLET | Freq: Every day | ORAL | 2 refills | Status: DC
Start: 2016-09-01 — End: 2016-11-15

## 2016-09-01 MED ORDER — IBUPROFEN 600 MG PO TABS: 600.0000 mg | ORAL_TABLET | Freq: Four times a day (QID) | ORAL | 0 refills | Status: DC

## 2016-09-01 NOTE — Progress Notes (Signed)
Patient ID: Tara Mcmahon, female   DOB: 1995-12-01, 20 y.o.   MRN: [id number] POSTPARTUM PROGRESS NOTE  Post Partum Day 2 Subjective:  Tara Mcmahon is a 20 y.o. [id number] 7115w3d s/p SVD from IOL for gHTN and decreased fetal movements.  No acute events overnight.  Endorses some cramping that is well controlled with medicine.  She has had flatus. She has not had bowel movement but plans to try colace at home.  Lochia minimal. Blood pressures here have been good, 130s-110s/70s-80s  Objective: Blood pressure 123/81, pulse 77, temperature 97.8 F (36.6 C), temperature source Oral, resp. rate 16, height 4\' 11"  (1.499 m), weight 88.2 kg (194 lb 6.4 oz), last menstrual period 10/31/2015, SpO2 100 %, unknown if currently breastfeeding.  Physical Exam:  General: alert, cooperative and no distress Lochia:normal flow Extremities: minimal edema   Recent Labs  08/29/16 0740  HGB 12.2  HCT 35.1*    Assessment/Plan:  ASSESSMENT: Tara Mcmahon is a 20 y.o. [id number] 6715w3d s/p NSVD  Discharge home, Breastfeeding and Contraception nexplanon   LOS: 3 days   Harvin Hazelhristina Rizk, Medical Student 09/01/2016, 7:15 AM   I have seen and examined this patient and I agree with the above. See Discharge Summary. Cam HaiSHAW, Aunika Kirsten 8:51 AM 09/01/2016

## 2016-09-01 NOTE — Discharge Summary (Signed)
OB Discharge Summary  Patient Name: Tara Mcmahon DOB: [birth year] MRN: [id number]  Date of admission: 08/29/2016 Delivering MD: Willadean Carol DODD   Date of discharge: 09/01/2016  Admitting diagnosis: INDUCTION Intrauterine pregnancy: [redacted]w[redacted]d     Secondary diagnosis:Active Problems:   Decreased fetal movement affecting management of mother, antepartum   Gestational hypertension  Additional problems: Gestational HTN with normal blood pressures after delivery. Hx PPD. Pt wanted to start Zoloft after delivery, so prescription was sent.    Discharge diagnosis: Term Pregnancy Delivered                                                                     Post partum procedures:none  Augmentation: AROM, Pitocin and Foley Balloon  Complications: None  Hospital course:  Induction of Labor With Vaginal Delivery   20 y.o. yo [id number] at [redacted]w[redacted]d was admitted to the hospital 08/29/2016 for induction of labor.  Indication for induction: Gestational hypertension and decreased fetal movement.  Patient had an uncomplicated labor course as follows: Membrane Rupture Time/Date: 7:32 AM ,08/30/2016   Intrapartum Procedures: Episiotomy: None [1]                                         Lacerations:  1st degree [2];Vaginal [6]  Patient had delivery of a Viable infant.  Information for the patient's newborn:  [place]is[place], [place] [[id number]]  Delivery Method: Vag-Spont   08/30/2016  Details of delivery can be found in separate delivery note.  Patient had a routine postpartum course. Patient is discharged home 09/01/16.   Physical exam  Vitals:   08/31/16 0531 08/31/16 1030 08/31/16 1910 09/01/16 0540  BP: 140/79 119/72 130/74 123/81  Pulse: (!) 109 88 96 77  Resp: 18 18 18 16   Temp: 98 F (36.7 C) 98.2 F (36.8 C) 97.8 F (36.6 C) 97.8 F (36.6 C)  TempSrc: Oral Oral Oral Oral  SpO2:  100% 100%   Weight:      Height:       General: alert, cooperative and no distress Lochia:  appropriate Uterine Fundus: firm Incision: N/A DVT Evaluation: No evidence of DVT seen on physical exam. Labs: Lab Results  Component Value Date   WBC 16.1 (H) 08/29/2016   HGB 12.2 08/29/2016   HCT 35.1 (L) 08/29/2016   MCV 83.8 08/29/2016   PLT 323 08/29/2016   CMP Latest Ref Rng & Units 08/29/2016  Glucose 65 - 99 mg/dL 130(Q)  BUN 6 - 20 mg/dL 9  Creatinine 6.57 - 8.46 mg/dL 9.62  Sodium 952 - 841 mmol/L 135  Potassium 3.5 - 5.1 mmol/L 4.0  Chloride 101 - 111 mmol/L 104  CO2 22 - 32 mmol/L 21(L)  Calcium 8.9 - 10.3 mg/dL 9.2  Total Protein 6.5 - 8.1 g/dL 6.4(L)  Total Bilirubin 0.3 - 1.2 mg/dL 0.4  Alkaline Phos 38 - 126 U/L 185(H)  AST 15 - 41 U/L 19  ALT 14 - 54 U/L 15    Discharge instruction: per After Visit Summary and "Baby and Me Booklet".  After Visit Meds:    Medication List    TAKE these medications  calcium carbonate 500 MG chewable tablet Commonly known as:  TUMS - dosed in mg elemental calcium Chew 1 tablet by mouth as needed for indigestion or heartburn.   ibuprofen 600 MG tablet Commonly known as:  ADVIL,MOTRIN Take 1 tablet (600 mg total) by mouth every 6 (six) hours.   levothyroxine 50 MCG tablet Commonly known as:  SYNTHROID Take 1 tablet (50 mcg total) by mouth daily before breakfast.   prenatal multivitamin Tabs tablet Take 1 tablet by mouth at bedtime.   sertraline 50 MG tablet Commonly known as:  ZOLOFT Take 1 tablet (50 mg total) by mouth daily.       Diet: low salt diet  Activity: Advance as tolerated. Pelvic rest for 6 weeks.   Outpatient follow up:6 weeks Follow up Appt: Future Appointments Date Time Provider Department Center  09/23/2016 11:30 AM Cheral MarkerKimberly R Booker, Tara Mcmahon [facility]   Follow up visit: No Follow-up on file.  Postpartum contraception: Nexplanon  Newborn Data: Live born female  Birth Weight: 5 lb 15.1 oz (2695 g) APGAR: 6, 7  Baby Feeding: Breast Disposition:home with  mother   09/01/2016 Hilton SinclairKaty D Mayo, MD   Tara Mcmahon attestation I have seen and examined this patient and agree with above documentation in the resident's note.   Tara Mcmahon is a 20 y.o. [id number] s/p SVD.   Pain is well controlled.  Plan for birth control is Nexplanon.  Method of Feeding: breast  PE:  BP 123/81 (BP Location: Left Arm)   Pulse 77   Temp 97.8 F (36.6 C) (Oral)   Resp 16   Ht 4\' 11"  (1.499 m)   Wt 88.2 kg (194 lb 6.4 oz)   LMP 10/31/2015   SpO2 100%   Breastfeeding? Unknown   BMI 39.26 kg/m  Fundus firm  No results for input(s): HGB, HCT in the last 72 hours.   Plan: discharge today - postpartum care discussed - f/u clinic in 6 weeks for postpartum visit   Cam HaiSHAW, Tara Mcmahon, Tara Mcmahon 8:52 AM  09/01/2016

## 2016-09-01 NOTE — Plan of Care (Signed)
Problem: Health Behavior/Discharge Planning: Goal: Ability to manage health-related needs will improve Outcome: Completed/Met Date Met: 09/01/16 Discharge teaching and paperwork discussed. Pt verbalizes understanding and denies questions.

## 2016-09-01 NOTE — Discharge Instructions (Signed)

## 2016-09-02 ENCOUNTER — Telehealth: Payer: Self-pay | Admitting: *Deleted

## 2016-09-02 NOTE — Telephone Encounter (Signed)
Johnetta with [facility] called. Pt's hemoglobin yesterday was 10.6. She had a vaginal delivery 10/3. She was discharged from hospital yesterday. Taking a prenatal vit daily. + breastfeeding. I spoke with Selena BattenKim, CNM. She advised everything sounds ok. Continue taking prenatal vit. Eat red meat and green leafy veg. Pt voiced understanding. Also, pt mentioned having swelling in feet. I advised that was normal after delivery and to elevate feet as much as possible. If swelling don't get better, let us know. Pt voiced understanding. JSY

## 2016-09-11 ENCOUNTER — Encounter (HOSPITAL_COMMUNITY): Payer: Self-pay | Admitting: *Deleted

## 2016-09-11 ENCOUNTER — Inpatient Hospital Stay (HOSPITAL_COMMUNITY): Payer: Medicaid Other

## 2016-09-11 ENCOUNTER — Inpatient Hospital Stay (HOSPITAL_COMMUNITY)
Admission: EM | Admit: 2016-09-11 | Discharge: 2016-09-13 | DRG: 776 | Disposition: A | Discharge status: Home / Self Care | Payer: Medicaid Other | Attending: Internal Medicine | Admitting: Internal Medicine

## 2016-09-11 DIAGNOSIS — N61 Mastitis without abscess: Secondary | ICD-10-CM | POA: Diagnosis present

## 2016-09-11 DIAGNOSIS — F418 Other specified anxiety disorders: Secondary | ICD-10-CM | POA: Diagnosis present

## 2016-09-11 DIAGNOSIS — O99345 Other mental disorders complicating the puerperium: Secondary | ICD-10-CM | POA: Diagnosis present

## 2016-09-11 DIAGNOSIS — R509 Fever, unspecified: Secondary | ICD-10-CM | POA: Diagnosis not present

## 2016-09-11 DIAGNOSIS — Z803 Family history of malignant neoplasm of breast: Secondary | ICD-10-CM | POA: Diagnosis not present

## 2016-09-11 DIAGNOSIS — O85 Puerperal sepsis: Principal | ICD-10-CM | POA: Diagnosis present

## 2016-09-11 DIAGNOSIS — E282 Polycystic ovarian syndrome: Secondary | ICD-10-CM | POA: Diagnosis present

## 2016-09-11 DIAGNOSIS — O864 Pyrexia of unknown origin following delivery: Secondary | ICD-10-CM

## 2016-09-11 DIAGNOSIS — N644 Mastodynia: Secondary | ICD-10-CM | POA: Diagnosis not present

## 2016-09-11 DIAGNOSIS — O9122 Nonpurulent mastitis associated with the puerperium: Secondary | ICD-10-CM | POA: Diagnosis present

## 2016-09-11 LAB — URINALYSIS, ROUTINE W REFLEX MICROSCOPIC
Bilirubin Urine: NEGATIVE
Glucose, UA: NEGATIVE mg/dL
Ketones, ur: NEGATIVE mg/dL
NITRITE: NEGATIVE
PH: 5.5 (ref 5.0–8.0)
Protein, ur: NEGATIVE mg/dL

## 2016-09-11 LAB — BASIC METABOLIC PANEL
Anion gap: 10 (ref 5–15)
BUN: 13 mg/dL (ref 6–20)
CHLORIDE: 102 mmol/L (ref 101–111)
CO2: 22 mmol/L (ref 22–32)
Calcium: 8.7 mg/dL — ABNORMAL LOW (ref 8.9–10.3)
Creatinine, Ser: 0.92 mg/dL (ref 0.44–1.00)
GFR calc Af Amer: >60 mL/min (ref >60)
GFR calc non Af Amer: >60 mL/min (ref >60)
Glucose, Bld: 117 mg/dL — ABNORMAL HIGH (ref 65–99)
POTASSIUM: 3.5 mmol/L (ref 3.5–5.1)
SODIUM: 134 mmol/L — AB (ref 135–145)

## 2016-09-11 LAB — CBC WITH DIFFERENTIAL/PLATELET
Basophils Absolute: 0 10*3/uL (ref 0.0–0.1)
Basophils Relative: 0 %
EOS ABS: 0 10*3/uL (ref 0.0–0.7)
Eosinophils Relative: 0 %
HEMATOCRIT: 36.8 % (ref 36.0–46.0)
HEMOGLOBIN: 12.4 g/dL (ref 12.0–15.0)
LYMPHS ABS: 1 10*3/uL (ref 0.7–4.0)
LYMPHS PCT: 4 %
MCH: 28.6 pg (ref 26.0–34.0)
MCHC: 33.7 g/dL (ref 30.0–36.0)
MCV: 85 fL (ref 78.0–100.0)
Monocytes Absolute: 1.1 10*3/uL — ABNORMAL HIGH (ref 0.1–1.0)
Monocytes Relative: 4 %
NEUTROS ABS: 25.8 10*3/uL — AB (ref 1.7–7.7)
NEUTROS PCT: 92 %
Platelets: 410 10*3/uL — ABNORMAL HIGH (ref 150–400)
RBC: 4.33 MIL/uL (ref 3.87–5.11)
RDW: 12.6 % (ref 11.5–15.5)
WBC: 28 10*3/uL — AB (ref 4.0–10.5)

## 2016-09-11 LAB — URINE MICROSCOPIC-ADD ON

## 2016-09-11 LAB — I-STAT CG4 LACTIC ACID, ED: Lactic Acid, Venous: 2.08 mmol/L (ref 0.5–1.9)

## 2016-09-11 MED ORDER — SODIUM CHLORIDE 0.9 % IV BOLUS (SEPSIS)
1000.0000 mL | Freq: Once | INTRAVENOUS | Status: AC
  Administered 2016-09-11: 1000 mL via INTRAVENOUS

## 2016-09-11 MED ORDER — VANCOMYCIN HCL IN DEXTROSE 1-5 GM/200ML-% IV SOLN
1000.0000 mg | Freq: Once | INTRAVENOUS | Status: AC
  Administered 2016-09-11: 1000 mg via INTRAVENOUS

## 2016-09-11 MED ORDER — SODIUM CHLORIDE 0.9% FLUSH: 3.0000 mL | Freq: Two times a day (BID) | INTRAVENOUS | Status: DC

## 2016-09-11 MED ORDER — SODIUM CHLORIDE 0.9 % IV BOLUS (SEPSIS): 1000.0000 mL | Freq: Once | INTRAVENOUS | Status: DC

## 2016-09-11 MED ORDER — ENOXAPARIN SODIUM 40 MG/0.4ML ~~LOC~~ SOLN: 40.0000 mg | SUBCUTANEOUS | Status: DC

## 2016-09-11 MED ORDER — ACETAMINOPHEN 325 MG PO TABS
650.0000 mg | ORAL_TABLET | Freq: Four times a day (QID) | ORAL | Status: DC | PRN
  Administered 2016-09-12 (×3): 650 mg via ORAL
  Filled 2016-09-11 (×3): qty 2

## 2016-09-11 MED ORDER — CEPHALEXIN 500 MG PO CAPS
500.0000 mg | ORAL_CAPSULE | Freq: Once | ORAL | Status: AC
  Administered 2016-09-11: 500 mg via ORAL
  Filled 2016-09-11: qty 1

## 2016-09-11 MED ORDER — SODIUM CHLORIDE 0.9 % IV SOLN
INTRAVENOUS | Status: DC
  Administered 2016-09-11 – 2016-09-13 (×4): via INTRAVENOUS

## 2016-09-11 MED ORDER — SODIUM CHLORIDE 0.9 % IV BOLUS (SEPSIS)
500.0000 mL | Freq: Once | INTRAVENOUS | Status: AC
  Administered 2016-09-11: 500 mL via INTRAVENOUS

## 2016-09-11 MED ORDER — SERTRALINE HCL 50 MG PO TABS
50.0000 mg | ORAL_TABLET | Freq: Every day | ORAL | Status: DC
  Administered 2016-09-12 – 2016-09-13 (×2): 50 mg via ORAL
  Filled 2016-09-11 (×2): qty 1

## 2016-09-11 MED ORDER — ACETAMINOPHEN 650 MG RE SUPP
650.0000 mg | Freq: Four times a day (QID) | RECTAL | Status: DC | PRN
Start: 2016-09-11 — End: 2016-09-13

## 2016-09-11 MED ORDER — TRAMADOL HCL 50 MG PO TABS: 50.0000 mg | ORAL_TABLET | Freq: Four times a day (QID) | ORAL | Status: DC | PRN

## 2016-09-11 NOTE — ED Notes (Signed)
CRITICAL VALUE ALERT  Critical value received:  Lactic 2.08  Date of notification:  09/11/16  Time of notification:  0735  Critical value read back:Yes.    Nurse who received alert:  Thornton Daleson L Patrice Matthew, RN  MD notified (1st page):  Ray  Time of first page:  1936  MD notified (2nd page):  Time of second page:  Responding MD:  Rosalia Hammersay  Time MD responded:  [phone]

## 2016-09-11 NOTE — H&P (Signed)
[facility] History and Physical  Tara Mcmahon ZHY:[id number] DOB: [birth year] DOA: 09/11/2016  Referring physician: Dr Rosalia Hammers, ED MD [facility] PCP: No PCP Per Patient   Chief Complaint: Breast pain post-partum  HPI: Tara Mcmahon is a 20 y.o. female who is 12 wks post-partum and presents with < 24 hr history of left breast swelling, tenderness , redness and fever to 102 deg at home.  In ED WBC is 28k, up from last WBC of 16k peripartum.  She had her baby delivered vaginally on 10/3 and was dc'd home on 10/5.  Asked to see for admission for mastitis.    Patient states she had a complicated pregnancy w hist of incompetent cervix treated with progesterone shots, cervical cerclage, and delivered the baby at 39 wks. The baby is healthy and has been doing well.  She has been breastfeeding and is at home, husband works in [place].  This is her second child, she didn't have any problems like this with her first child.     Patient moved here w her family two years ago from [place] where she grew up.  Her husband's parents live in [place].  Her husband is a Chartered certified accountant.    Since arrival patient has rec'd 2.5 L of IVF and po Keflex.  She states that she feels "a lot better", not having as much breast pain and fevers/ myalgias have improved since arriving to ED.       ROS  denies CP  no joint pain   no HA  no blurry vision  no rash  no diarrhea  no nausea/ vomiting  no dysuria  no difficulty voiding  no change in urine color    Past Medical History  Past Medical History:  Diagnosis Date  . Anxiety   . Cervical incompetence during pregnancy   . Depression   . Hypothyroidism   . PCOS (polycystic ovarian syndrome)    Past Surgical History  Past Surgical History:  Procedure Laterality Date  . CERVICAL CERCLAGE    . CERVICAL CERCLAGE N/A 03/09/2016   Procedure: [facility] CERCLAGE PLACEMENT;  Surgeon: Lazaro Arms, MD;  Location: [facility];  Service: Gynecology;  Laterality: N/A;   . labial reduction     Family History  Family History  Problem Relation Age of Onset  . Arthritis Mother   . Lupus Mother   . Autoimmune disease Mother   . Cancer Maternal Grandmother     breast  . Heart disease Maternal Grandfather   . Heart disease Paternal Grandfather   . Hypertension Paternal Grandfather    Social History  reports that she has never smoked. She has never used smokeless tobacco. She reports that she does not drink alcohol or use drugs. Allergies No Known Allergies Home medications Prior to Admission medications   Medication Sig Start Date End Date Taking? Authorizing Provider  ibuprofen (ADVIL,MOTRIN) 600 MG tablet Take 1 tablet (600 mg total) by mouth every 6 (six) hours. 09/01/16  Yes Campbell Stall, MD  sertraline (ZOLOFT) 50 MG tablet Take 1 tablet (50 mg total) by mouth daily. 09/01/16  Yes Campbell Stall, MD   Liver Function Tests No results for input(s): AST, ALT, ALKPHOS, BILITOT, PROT, ALBUMIN in the last 168 hours. No results for input(s): LIPASE, AMYLASE in the last 168 hours. CBC  Recent Labs Lab 09/11/16 1903  WBC 28.0*  NEUTROABS 25.8*  HGB 12.4  HCT 36.8  MCV 85.0  PLT 410*   Basic Metabolic Panel  Recent Labs  Lab 09/11/16 1903  NA 134*  K 3.5  CL 102  CO2 22  GLUCOSE 117*  BUN 13  CREATININE 0.92  CALCIUM 8.7*     Vitals:   09/11/16 1838 09/11/16 1839 09/11/16 2011  BP: 123/81  119/74  Pulse: (!) 124  111  Resp: 18  12  Temp: 100.1 F (37.8 C)    TempSrc: Oral    SpO2: 98%  92%  Weight:  79.4 kg (175 lb)   Height:  4\' 11"  (1.499 m)    Exam: Gen tearful, anxious, not in distress No rash, cyanosis or gangrene Sclera anicteric, throat clear  No jvd or bruits Chest clear bilat L breast mild erythema/ tenderness R upper outer quad, no fluctuance, no drainage, nipple normal bilat R breast no erythema/ tenderness RRR no MRG Abd soft ntnd no mass or ascites +bs  GU defer MS no joint effusions or deformity Ext no  LE edema / no wounds or ulcers Neuro is alert, Ox 3 , nf  Na 134  K 3.5  BUN 13  Cr 0.92    CO2 22  Lactic 2.08 WBC 28k   Hb 12  plt 410  EKG (independ reviewed) > sinus tach, VR 115, no acute changes CXR (independ reviewed) > pend   Assessment: 1. Fever/ L breast pain/ redness/ [facility]^WBC - presentation c/w acute mastitis.  WBC 28k but BP's normal, feels and looks better after IVF's and po abx.  Plan admit.  Have d/w OB on call in [place].  Plan IV vanc, po nsaids/ compresses, lots of IVF's, and important to continue to express milk and/or to continue to breastfeed to prevent engorgement.  Recommendations are for continued breastfeeding during mastitis, no harm will be done to the child.  F/U WBC/ fever, etc.  When ready for dc OB suggests po dicloxacillan.  2. S/P IUP , delivered at 39 wks on 08/30/16 3. Depression - on Zoloft  Plan - as above     Maeola Mchaney D [facility] Pager [phone]   If 7PM-7AM, please contact night-coverage [email or web address] Password [email or web address] 09/11/2016, 8:37 PM

## 2016-09-11 NOTE — ED Triage Notes (Signed)
Pt comes in for left breast irritation and redness. She states this started this morning. She recently had a baby (12 days ago) and is breast feeding. Pt has been having fevers. Denies v/d; nausea present.

## 2016-09-11 NOTE — ED Provider Notes (Signed)
[facility] Provider Note   CSN: [id number] Arrival date & time: 09/11/16  1830     History   Chief Complaint Chief Complaint  Patient presents with  . Breast Pain    HPI Tara Mcmahon is a 20 y.o. female.  HPI  20 year old female G2 P2 postpartum day 12 presents complaining of erythema and tenderness to left breast. She is breast-feeding. She noticed some redness on the superior aspect of the breast that began today. She has continued to feed the baby and express milk. She spoke with the nurse on call for her OB/GYN and was instructed to come to the hospital. She has noted some fever and chills. She has not had nausea or vomiting. Previous similar symptoms. She states that she had a normal pregnancy. She is taking prenatal vitamins.  Past Medical History:  Diagnosis Date  . Anxiety   . Cervical incompetence during pregnancy   . Depression   . Hypothyroidism   . PCOS (polycystic ovarian syndrome)     Patient Active Problem List   Diagnosis Date Noted  . Decreased fetal movement affecting management of mother, antepartum 08/29/2016  . Gestational hypertension 08/29/2016  . UTI (urinary tract infection) during pregnancy 03/30/2016  . Subclinical hypothyroidism 02/04/2016  . History of suicidal ideation 02/03/2016  . Supervision of normal pregnancy 01/27/2016  . History of postpartum depression, currently pregnant in third trimester 01/27/2016  . PCOS (polycystic ovarian syndrome) 01/27/2016  . Depression with anxiety 01/27/2016  . Family history of systemic lupus erythematosus (SLE) in mother 01/27/2016    Past Surgical History:  Procedure Laterality Date  . CERVICAL CERCLAGE    . CERVICAL CERCLAGE N/A 03/09/2016   Procedure: [facility] CERCLAGE PLACEMENT;  Surgeon: Lazaro Arms, MD;  Location: [facility] ORS;  Service: Gynecology;  Laterality: N/A;  . labial reduction      OB History    Gravida Para Term Preterm AB Living   3 2 1 1 1 2    SAB TAB Ectopic Multiple  Live Births   1     0 2       Home Medications    Prior to Admission medications   Medication Sig Start Date End Date Taking? Authorizing Provider  ibuprofen (ADVIL,MOTRIN) 600 MG tablet Take 1 tablet (600 mg total) by mouth every 6 (six) hours. 09/01/16  Yes Campbell Stall, MD  sertraline (ZOLOFT) 50 MG tablet Take 1 tablet (50 mg total) by mouth daily. 09/01/16  Yes Campbell Stall, MD    Family History Family History  Problem Relation Age of Onset  . Arthritis Mother   . Lupus Mother   . Autoimmune disease Mother   . Cancer Maternal Grandmother     breast  . Heart disease Maternal Grandfather   . Heart disease Paternal Grandfather   . Hypertension Paternal Grandfather     Social History Social History  Substance Use Topics  . Smoking status: Never Smoker  . Smokeless tobacco: Never Used  . Alcohol use No     Allergies   Review of patient's allergies indicates no known allergies.   Review of Systems Review of Systems  All other systems reviewed and are negative.    Physical Exam Updated Vital Signs BP 123/81 (BP Location: Right Arm)   Pulse (!) 124   Temp 100.1 F (37.8 C) (Oral)   Resp 18   Ht 4\' 11"  (1.499 m)   Wt 79.4 kg   SpO2 98%   BMI 35.35 kg/m  Physical Exam  Constitutional: She is oriented to person, place, and time. She appears well-developed and well-nourished.  HENT:  Head: Normocephalic and atraumatic.  Eyes: EOM are normal. Pupils are equal, round, and reactive to light.  Neck: Normal range of motion. Neck supple.  Cardiovascular: Tachycardia present.   Pulmonary/Chest: Effort normal and breath sounds normal.  Abdominal: Soft. Bowel sounds are normal.  Genitourinary:  Genitourinary Comments: Left breast with some erythema upper aspect with mild tenderness to palpation. No fluctuance or warmth noted.  Musculoskeletal: Normal range of motion.  Neurological: She is alert and oriented to person, place, and time.  Skin: Skin is warm and  dry. Capillary refill takes less than 2 seconds.  Psychiatric: She has a normal mood and affect.  Nursing note and vitals reviewed.    ED Treatments / Results  Labs (all labs ordered are listed, but only abnormal results are displayed) Labs Reviewed  CBC WITH DIFFERENTIAL/PLATELET - Abnormal; Notable for the following:       Result Value   WBC 28.0 (*)    Platelets 410 (*)    Neutro Abs 25.8 (*)    Monocytes Absolute 1.1 (*)    All other components within normal limits  I-STAT CG4 LACTIC ACID, ED - Abnormal; Notable for the following:    Lactic Acid, Venous 2.08 (*)    All other components within normal limits  CULTURE, BLOOD (ROUTINE X 2)  CULTURE, BLOOD (ROUTINE X 2)  URINE CULTURE  BASIC METABOLIC PANEL  URINALYSIS, ROUTINE W REFLEX MICROSCOPIC (NOT AT [facility])    EKG  EKG Interpretation None       Radiology No results found.  Procedures Procedures (including critical care time)  Medications Ordered in ED Medications  sodium chloride 0.9 % bolus 1,000 mL (1,000 mLs Intravenous New Bag/Given 09/11/16 1912)  sodium chloride 0.9 % bolus 1,000 mL (not administered)    And  sodium chloride 0.9 % bolus 1,000 mL (not administered)    And  sodium chloride 0.9 % bolus 500 mL (not administered)  cephALEXin (KEFLEX) capsule 500 mg (500 mg Oral Given 09/11/16 1928)     Initial Impression / Assessment and Plan / ED Course  I have reviewed the triage vital signs and the nursing notes.  Pertinent labs & imaging results that were available during my care of the patient were reviewed by me and considered in my medical decision making (see chart for details).  Clinical Course    20 year old female with mastitis. She is receiving IV fluids, ancef.  Sepsis - Repeat Assessment  Performed at:    8:01 PM   Vitals     Blood pressure 123/81, pulse (!) 124, temperature 100.1 F (37.8 C), temperature source Oral, resp. rate 18, height 4\' 11"  (1.499 m), weight 79.4 kg, SpO2 98  %, unknown if currently breastfeeding.  Heart:     Tachycardic  Lungs:    CTA  Capillary Refill:   <2 sec  Peripheral Pulse:   Radial pulse palpable  Skin:     Pale    Final Clinical Impressions(s) / ED Diagnoses   Final diagnoses:  Mastitis, left, acute    New Prescriptions New Prescriptions   No medications on file     Margarita Grizzleanielle Angie Piercey, MD 09/11/16 2150

## 2016-09-11 NOTE — Progress Notes (Signed)
ANTIBIOTIC CONSULT NOTE-Preliminary  Pharmacy Consult for vancomycin Indication: mastitis  No Known Allergies  Patient Measurements: Height: 4\' 11"  (149.9 cm) Weight: 180 lb 3.2 oz (81.7 kg) IBW/kg (Calculated) : 43.2 Adjusted Body Weight: 54.8  Vital Signs: Temp: 98.8 F (37.1 C) (10/15 2210) Temp Source: Oral (10/15 2210) BP: 126/74 (10/15 2210) Pulse Rate: 104 (10/15 2210)  Labs:  Recent Labs  09/11/16 1903  WBC 28.0*  HGB 12.4  PLT 410*  CREATININE 0.92    Estimated Creatinine Clearance: 88.7 mL/min (by C-G formula based on SCr of 0.92 mg/dL).  No results for input(s): VANCOTROUGH, VANCOPEAK, VANCORANDOM, GENTTROUGH, GENTPEAK, GENTRANDOM, TOBRATROUGH, TOBRAPEAK, TOBRARND, AMIKACINPEAK, AMIKACINTROU, AMIKACIN in the last 72 hours.   Microbiology: Recent Results (from the past 720 hour(s))  Blood culture (routine x 2)     Status: None (Preliminary result)   Collection Time: 09/11/16  7:04 PM  Result Value Ref Range Status   Specimen Description RIGHT ANTECUBITAL  Final   Special Requests BOTTLES DRAWN AEROBIC AND ANAEROBIC 6CC  Final   Culture PENDING  Incomplete   Report Status PENDING  Incomplete  Blood culture (routine x 2)     Status: None (Preliminary result)   Collection Time: 09/11/16  7:30 PM  Result Value Ref Range Status   Specimen Description LEFT ANTECUBITAL  Final   Special Requests BOTTLES DRAWN AEROBIC AND ANAEROBIC 6CC  Final   Culture PENDING  Incomplete   Report Status PENDING  Incomplete    Medical History: Past Medical History:  Diagnosis Date  . Anxiety   . Cervical incompetence during pregnancy   . Depression   . Hypothyroidism   . PCOS (polycystic ovarian syndrome)     Medications:  Scheduled:  . enoxaparin (LOVENOX) injection  40 mg Subcutaneous Q24H  . [START ON 09/12/2016] sertraline  50 mg Oral Daily  . sodium chloride flush  3 mL Intravenous Q12H  . vancomycin  1,000 mg Intravenous Once   Infusions:  . sodium  chloride 150 mL/hr at 09/11/16 2312   PRN: acetaminophen **OR** acetaminophen, traMADol Anti-infectives    Start     Dose/Rate Route Frequency Ordered Stop   09/11/16 2330  vancomycin (VANCOCIN) IVPB 1000 mg/200 mL premix     1,000 mg 200 mL/hr over 60 Minutes Intravenous  Once 09/11/16 2317     09/11/16 1930  cephALEXin (KEFLEX) capsule 500 mg     500 mg Oral  Once 09/11/16 1921 09/11/16 1928      Assessment: 20 yo female 12 weeks postpartum presents with mastitis. Starting vancomycin.   Goal of Therapy:  Vancomycin trough level 10-15 mcg/ml  Plan:  Preliminary review of pertinent patient information completed.  Protocol will be initiated with a one-time dose(s) of vancomycin 1000 mg.  Jeani HawkingAnnie Penn clinical pharmacist will complete review during morning rounds to assess patient and finalize treatment regimen.  Gwenyth Dingee, Berneice Heinrichiffany Scarlett, RPH 09/11/2016,11:23 PM

## 2016-09-12 DIAGNOSIS — O864 Pyrexia of unknown origin following delivery: Secondary | ICD-10-CM

## 2016-09-12 DIAGNOSIS — N61 Mastitis without abscess: Secondary | ICD-10-CM

## 2016-09-12 DIAGNOSIS — F418 Other specified anxiety disorders: Secondary | ICD-10-CM

## 2016-09-12 DIAGNOSIS — O9122 Nonpurulent mastitis associated with the puerperium: Secondary | ICD-10-CM

## 2016-09-12 LAB — COMPREHENSIVE METABOLIC PANEL
ALT: 19 U/L (ref 14–54)
AST: 14 U/L — AB (ref 15–41)
Albumin: 2.9 g/dL — ABNORMAL LOW (ref 3.5–5.0)
Alkaline Phosphatase: 85 U/L (ref 38–126)
Anion gap: 8 (ref 5–15)
BILIRUBIN TOTAL: 0.5 mg/dL (ref 0.3–1.2)
BUN: 8 mg/dL (ref 6–20)
CO2: 21 mmol/L — ABNORMAL LOW (ref 22–32)
Calcium: 7.6 mg/dL — ABNORMAL LOW (ref 8.9–10.3)
Chloride: 106 mmol/L (ref 101–111)
Creatinine, Ser: 0.77 mg/dL (ref 0.44–1.00)
Glucose, Bld: 105 mg/dL — ABNORMAL HIGH (ref 65–99)
POTASSIUM: 3.1 mmol/L — AB (ref 3.5–5.1)
Sodium: 135 mmol/L (ref 135–145)
TOTAL PROTEIN: 6.3 g/dL — AB (ref 6.5–8.1)

## 2016-09-12 LAB — CBC
HEMATOCRIT: 32.5 % — AB (ref 36.0–46.0)
Hemoglobin: 11 g/dL — ABNORMAL LOW (ref 12.0–15.0)
MCH: 28.7 pg (ref 26.0–34.0)
MCHC: 33.8 g/dL (ref 30.0–36.0)
MCV: 84.9 fL (ref 78.0–100.0)
Platelets: 380 10*3/uL (ref 150–400)
RBC: 3.83 MIL/uL — ABNORMAL LOW (ref 3.87–5.11)
RDW: 12.7 % (ref 11.5–15.5)
WBC: 20.4 10*3/uL — AB (ref 4.0–10.5)

## 2016-09-12 MED ORDER — POTASSIUM CHLORIDE CRYS ER 20 MEQ PO TBCR
40.0000 meq | EXTENDED_RELEASE_TABLET | Freq: Two times a day (BID) | ORAL | Status: AC
  Administered 2016-09-12 (×2): 40 meq via ORAL
  Filled 2016-09-12 (×2): qty 2

## 2016-09-12 MED ORDER — VANCOMYCIN HCL IN DEXTROSE 1-5 GM/200ML-% IV SOLN
1000.0000 mg | Freq: Two times a day (BID) | INTRAVENOUS | Status: DC
  Administered 2016-09-12 (×2): 1000 mg via INTRAVENOUS
  Filled 2016-09-12 (×2): qty 200

## 2016-09-12 NOTE — Progress Notes (Signed)
Pt's husband brought her breast pump from home. PT pumping as directed by OBGYN. PT offered heat packs for comfort. Continue to monitor

## 2016-09-12 NOTE — H&P (Signed)
PROGRESS NOTE    Tara Mcmahon  WUJ:[id number] DOB: 23-Sep-1996 DOA: 09/11/2016 PCP: No PCP Per Patient    Brief Narrative:  20 y.o. female who is 12 wks post-partum and presents with < 24 hr history of left breast swelling, tenderness , redness and fever to 102 deg at home  Assessment & Plan:   Principal Problem:   Postpartum mastitis Active Problems:   Depression with anxiety   Fever   Breast pain   Mastitis  1. Acute mastitis with sepsis present on admission 1. She presents febrile with leukocytosis and tachycardia 2. Per patient, redness appears largely unchanged 3. Overall, patient reports clinical improvement since starting vancomycin 4. MAXIMUM TEMPERATURE of 103 Fahrenheit this morning at 4:30 5. Continue IV vancomycin for now 6. The cultures reviewed, thus far negative 2 7. Anticipate when patient no longer febrile, transitioned to doxycycline per OB recommendations 2. Depression 1. Stable this time 2. Patient continued on SSRI 3. History of PCOS 1. Seems stable. 2. Random blood glucose of 105 this morning 4. History of Hypothyroid 1. Most recent TSH reviewed, noted to be 3.9 which was within normal limits 2. Not on thyroid replacement at this time  DVT prophylaxis: Lovenox subcutaneous Code Status: Full Code Family Communication: Patient in room, family not at bedside Disposition Plan: Uncertain at this time  Consultants:     Procedures:     Antimicrobials: Anti-infectives    Start     Dose/Rate Route Frequency Ordered Stop   09/12/16 1200  vancomycin (VANCOCIN) IVPB 1000 mg/200 mL premix     1,000 mg 200 mL/hr over 60 Minutes Intravenous Every 12 hours 09/12/16 0744     09/11/16 2330  vancomycin (VANCOCIN) IVPB 1000 mg/200 mL premix     1,000 mg 200 mL/hr over 60 Minutes Intravenous  Once 09/11/16 2317 09/12/16 0039   09/11/16 1930  cephALEXin (KEFLEX) capsule 500 mg     500 mg Oral  Once 09/11/16 1921 09/11/16 1928        Subjective: Overall feels much better, however reports breast still erythematous  Objective: Vitals:   09/12/16 0527 09/12/16 0546 09/12/16 1051 09/12/16 1542  BP:    131/80  Pulse:    (!) 108  Resp:    15  Temp: (!) 100.5 F (38.1 C)  (!) 100.4 F (38 C) 99.3 F (37.4 C)  TempSrc: Oral  Oral Oral  SpO2:    98%  Weight:  81.6 kg (179 lb 15.6 oz)    Height:        Intake/Output Summary (Last 24 hours) at 09/12/16 1806 Last data filed at 09/12/16 1511  Gross per 24 hour  Intake             3110 ml  Output                0 ml  Net             3110 ml   Filed Weights   09/11/16 1839 09/11/16 2210 09/12/16 0546  Weight: 79.4 kg (175 lb) 81.7 kg (180 lb 3.2 oz) 81.6 kg (179 lb 15.6 oz)    Examination:  General exam: Appears calm and comfortable  Respiratory system: Clear to auscultation. Respiratory effort normal. Cardiovascular system: S1 & S2 heard, RRR. Gastrointestinal system: Abdomen is nondistended, soft and nontender. No organomegaly or masses felt. Normal bowel sounds heard. Central nervous system: Alert and oriented. No focal neurological deficits. Extremities: Symmetric 5 x 5 power. Skin: Left breast with  patchy, poorly demarcated erythema with no active drainage, normal skin turgor Psychiatry: Judgement and insight appear normal. Mood & affect appropriate.   Data Reviewed: I have personally reviewed following labs and imaging studies  CBC:  Recent Labs Lab 09/11/16 1903 09/12/16 0643  WBC 28.0* 20.4*  NEUTROABS 25.8*  --   HGB 12.4 11.0*  HCT 36.8 32.5*  MCV 85.0 84.9  PLT 410* 380   Basic Metabolic Panel:  Recent Labs Lab 09/11/16 1903 09/12/16 0643  NA 134* 135  K 3.5 3.1*  CL 102 106  CO2 22 21*  GLUCOSE 117* 105*  BUN 13 8  CREATININE 0.92 0.77  CALCIUM 8.7* 7.6*   GFR: Estimated Creatinine Clearance: 102 mL/min (by C-G formula based on SCr of 0.77 mg/dL). Liver Function Tests:  Recent Labs Lab 09/12/16 0643  AST 14*   ALT 19  ALKPHOS 85  BILITOT 0.5  PROT 6.3*  ALBUMIN 2.9*   No results for input(s): LIPASE, AMYLASE in the last 168 hours. No results for input(s): AMMONIA in the last 168 hours. Coagulation Profile: No results for input(s): INR, PROTIME in the last 168 hours. Cardiac Enzymes: No results for input(s): CKTOTAL, CKMB, CKMBINDEX, TROPONINI in the last 168 hours. BNP (last 3 results) No results for input(s): PROBNP in the last 8760 hours. HbA1C: No results for input(s): HGBA1C in the last 72 hours. CBG: No results for input(s): GLUCAP in the last 168 hours. Lipid Profile: No results for input(s): CHOL, HDL, LDLCALC, TRIG, CHOLHDL, LDLDIRECT in the last 72 hours. Thyroid Function Tests: No results for input(s): TSH, T4TOTAL, FREET4, T3FREE, THYROIDAB in the last 72 hours. Anemia Panel: No results for input(s): VITAMINB12, FOLATE, FERRITIN, TIBC, IRON, RETICCTPCT in the last 72 hours. Sepsis Labs:  Recent Labs Lab 09/11/16 1918  LATICACIDVEN 2.08*    Recent Results (from the past 240 hour(s))  Blood culture (routine x 2)     Status: None (Preliminary result)   Collection Time: 09/11/16  7:04 PM  Result Value Ref Range Status   Specimen Description RIGHT ANTECUBITAL  Final   Special Requests BOTTLES DRAWN AEROBIC AND ANAEROBIC 6CC  Final   Culture NO GROWTH < 24 HOURS  Final   Report Status PENDING  Incomplete  Blood culture (routine x 2)     Status: None (Preliminary result)   Collection Time: 09/11/16  7:30 PM  Result Value Ref Range Status   Specimen Description LEFT ANTECUBITAL  Final   Special Requests BOTTLES DRAWN AEROBIC AND ANAEROBIC 6CC  Final   Culture NO GROWTH < 24 HOURS  Final   Report Status PENDING  Incomplete     Radiology Studies: Dg Chest Port 1 View  Result Date: 09/11/2016 CLINICAL DATA:  Postpartum fever EXAM: PORTABLE CHEST 1 VIEW COMPARISON:  11/25/2015 FINDINGS: Normal heart size and mediastinal contours. No acute infiltrate or edema. No  effusion or pneumothorax. No osseous findings. IMPRESSION: Negative portable chest. Electronically Signed   By: Marnee Spring M.D.   On: 09/11/2016 21:53    Scheduled Meds: . enoxaparin (LOVENOX) injection  40 mg Subcutaneous Q24H  . potassium chloride  40 mEq Oral BID  . sertraline  50 mg Oral Daily  . sodium chloride flush  3 mL Intravenous Q12H  . vancomycin  1,000 mg Intravenous Q12H   Continuous Infusions: . sodium chloride 150 mL/hr at 09/12/16 1036     LOS: 1 day   CHIU, Scheryl Marten, MD [facility] Pager [phone]  If 7PM-7AM, please contact night-coverage [email or web address]  Password [email or web address] 09/12/2016, 6:06 PM

## 2016-09-12 NOTE — Progress Notes (Signed)
Pharmacy Antibiotic Note  Tara Mcmahon is a 20 y.o. female admitted on 09/11/2016 with cellulitis / mastitis.  Pharmacy has been consulted for Vancomycin dosing.  Plan: Vancomycin 1gm IV every 12 hours.  Goal trough 10-15 mcg/mL.  Height: 4\' 11"  (149.9 cm) Weight: 179 lb 15.6 oz (81.6 kg) IBW/kg (Calculated) : 43.2  Temp (24hrs), Avg:100.2 F (37.9 C), Min:98.4 F (36.9 C), Max:103.1 F (39.5 C)   Recent Labs Lab 09/11/16 1903 09/11/16 1918 09/12/16 0643  WBC 28.0*  --  20.4*  CREATININE 0.92  --  0.77  LATICACIDVEN  --  2.08*  --     Estimated Creatinine Clearance: 102 mL/min (by C-G formula based on SCr of 0.77 mg/dL).    No Known Allergies  Antimicrobials this admission: Vancomycin 10/15 >>   Dose adjustments this admission: n/a   Microbiology results: 10/15 BCx: pending 10/15 UCx:    Thank you for allowing pharmacy to be a part of this patient's care.  Mady GemmaHayes, Veronique Warga R 09/12/2016 11:35 AM

## 2016-09-13 ENCOUNTER — Telehealth (HOSPITAL_COMMUNITY): Payer: Self-pay | Admitting: Lactation Services

## 2016-09-13 DIAGNOSIS — N644 Mastodynia: Secondary | ICD-10-CM

## 2016-09-13 LAB — CBC
HEMATOCRIT: 33 % — AB (ref 36.0–46.0)
Hemoglobin: 11 g/dL — ABNORMAL LOW (ref 12.0–15.0)
MCH: 28.6 pg (ref 26.0–34.0)
MCHC: 33.3 g/dL (ref 30.0–36.0)
MCV: 85.9 fL (ref 78.0–100.0)
Platelets: 340 10*3/uL (ref 150–400)
RBC: 3.84 MIL/uL — ABNORMAL LOW (ref 3.87–5.11)
RDW: 12.7 % (ref 11.5–15.5)
WBC: 10.3 10*3/uL (ref 4.0–10.5)

## 2016-09-13 LAB — URINE CULTURE

## 2016-09-13 MED ORDER — DOXYCYCLINE HYCLATE 100 MG PO CAPS: 100.0000 mg | ORAL_CAPSULE | Freq: Every day | ORAL | 0 refills | Status: DC

## 2016-09-13 MED ORDER — TRIAMCINOLONE ACETONIDE 0.1 % EX OINT
TOPICAL_OINTMENT | Freq: Three times a day (TID) | CUTANEOUS | Status: DC | PRN
  Filled 2016-09-13: qty 15

## 2016-09-13 MED ORDER — TRIAMCINOLONE ACETONIDE 0.1 % EX OINT: TOPICAL_OINTMENT | Freq: Three times a day (TID) | CUTANEOUS | 0 refills | Status: DC | PRN

## 2016-09-13 NOTE — Telephone Encounter (Addendum)
Mom called and reported she has a 612 week old. Mom reports she has been in the hospital with mastitis and systemic sepsis. Mom reports she is scared she will get septic again and wants to know how to dry up milk as quickly as possible.  Enc her to consider continuing to BF, she reports she does not wish to as having the sepsis was very scary and progressed very quickly.  Discussed that with Mastitis it is protocol to empty the breast regularly and that I would not recommend her stopping pumping immediately.  She reports she is not BF infant and is not giving the infant her milk. She reports she is pumping every 3-4 hours for 10-15 minutes and getting 1-2 oz/pumping.  She only wants to give EBM from non infected breast. She reports her breast is still reddened and painful. Enc her to use supportive bra, ice packs, cabbage leaves and to start by decreasing pumping time by a few minutes/pumping daily or to space pumping out a little at a time and then to start dropping one pumping a day until she is no longer pumping.  Mom is on Doxycycline (L3) and Vancomycin (L1), reviewed information with her from Medications and Mother's Milk by Bobbye Mortonhomas Hale.  Advised mom to call with further questions/concerns.

## 2016-09-13 NOTE — Discharge Summary (Signed)
Physician Discharge Summary  Tara Mcmahon ZOX:[id number] DOB: 07-Jun-1996 DOA: 09/11/2016  PCP: No PCP Per Patient  Admit date: 09/11/2016 Discharge date: 09/13/2016  Admitted From: Home Disposition:  Home  Recommendations for Outpatient Follow-up:  1. Follow up with PCP in 1-2 weeks  Discharge Condition:Improved CODE STATUS:Full Diet recommendation: Regular   Brief/Interim Summary: Please see admit h and p from 10/16 for details. Briefly, 20 y.o.femalewho is 12 wks post-partum and presents with <24 hr history of left breast swelling, tenderness , redness and fever to 102 deg at home  1. Acute mastitis with sepsis present on admission 1. She presented febrile with leukocytosis and tachycardia 2. Per patient, redness had improved with antibiotics 3. Patient initially presented febrile with a MAXIMUM TEMPERATURE of 103F. Patient has since remained afebrile the remainder of the hospital course. 4. Patient is been continued on IV vancomycin 5. Blood cultures negative 2 6. Patient transitioned to doxycycline to complete course of antibiotics at time of discharge 2. Depression 1. Remained stable 2. Patient to continue SSRI 3. History of PCOS 1. Seems stable this hospital admission 2. Random glucose remains stable 4. History of Hypothyroid 1. Most recent TSH reviewed, noted to be 3.9 which was within normal limits 2. Not on thyroid replacement at this time  Discharge Diagnoses:  Principal Problem:   Postpartum mastitis Active Problems:   Depression with anxiety   Fever   Breast pain   Mastitis    Discharge Instructions     Medication List    TAKE these medications   doxycycline 100 MG capsule Commonly known as:  VIBRAMYCIN Take 1 capsule (100 mg total) by mouth daily.   ibuprofen 600 MG tablet Commonly known as:  ADVIL,MOTRIN Take 1 tablet (600 mg total) by mouth every 6 (six) hours.   sertraline 50 MG tablet Commonly known as:  ZOLOFT Take 1 tablet  (50 mg total) by mouth daily.   triamcinolone ointment 0.1 % Commonly known as:  KENALOG Apply topically 3 (three) times daily as needed (for itching/rash).      Follow-up Information    Follow up with your PCP in 2-3 weeks. Schedule an appointment as soon as possible for a visit in 3 week(s).        Follow up with your Ob physycian as scheduled .          No Known Allergies   Procedures/Studies: [place] [facility] Follow Up  Result Date: 09/04/2016 FOLLOW UP SONOGRAM Tara Harpershleigh Tara Mcmahon is in the office for a follow up sonogram for EFW,BPP and cord doppler. She is a 20 y.o. year old G3P0111 with Estimated Date of Delivery: 09/03/16 by early ultrasound now at  2468w6d weeks gestation. Thus far the pregnancy has been complicated by cerclage,hx of preterm delivery.. GESTATION: SINGLETON PRESENTATION: cephalic FETAL ACTIVITY:          Heart rate         124          The fetus is active. AMNIOTIC FLUID: The amniotic fluid volume is  normal, 23.4 cm. PLACENTA LOCALIZATION:  posterior GRADE 3 CERVIX: Limited view ADNEXA: wnl GESTATIONAL AGE AND  BIOMETRICS: Gestational criteria: Estimated Date of Delivery: 09/03/16 by US now at 2568w6d Previous Scans:9 BIOPHYSCIAL PROFILE:  COMMENTS GROSS BODY MOVEMENT                 2  TONE                2  RESPIRATIONS                2  AMNIOTIC FLUID                2                                                          SCORE:  8/8 (Note: NST was not performed as part of this antepartum testing) DOPPLER FLOW STUDIES: UMBILICAL ARTERY RI RATIOS:   0.74, 0.76  >95th percentile          BIPARIETAL DIAMETER           9.65 cm         39+3 weeks HEAD CIRCUMFERENCE           34.97 cm         40+1 weeks ABDOMINAL CIRCUMFERENCE           34.31 cm         38+1 weeks FEMUR LENGTH           7.53 cm         38+4 weeks                                                       AVERAGE EGA(BY THIS  SCAN):  39+1 weeks                                                 ESTIMATED FETAL WEIGHT:       3482  grams, 61 % ANATOMICAL SURVEY                                                                            COMMENTS CEREBRAL VENTRICLES yes normal  CHOROID PLEXUS    CEREBELLUM    CISTERNA MAGNA    NUCHAL REGION    ORBITS    NASAL BONE    NOSE/LIP yes normal  FACIAL PROFILE yes normal  4 CHAMBERED HEART yes normal  OUTFLOW TRACTS    DIAPHRAGM yes normal  STOMACH yes normal  RENAL REGION yes normal  BLADDER yes normal  CORD INSERTION    3 VESSEL CORD yes normal  SPINE    ARMS/HANDS    LEGS/FEET    GENITALIA yes normal female     SUSPECTED ABNORMALITIES:  yes  RI'S >95 TH percentile QUALITY OF SCAN: satisfactory TECHNICIAN COMMENTS: [place] 38+6 wks,cephalic,fhr 124 bpm,bilat adnexa's wnl,BPP 8/8,RI .74,.76, RI'S >95TH percentile,post pl gr 3,afi 23.4  cm,EFW 3482 g 61% A copy of this report including all images has been saved and backed up to a second source for retrieval if needed. All measures and details of the anatomical scan, placentation, fluid volume and pelvic anatomy are contained in that report. Tara Mcmahon 08/26/2016 12:19 PM Clinical Impression and recommendations: I have reviewed the sonogram results above. Combined with the patient's current clinical course, below are my impressions and any appropriate recommendations for management based on the sonographic findings: 1. Term intrauterine pregnancy with BPP 8 out of 8 and increasin resistance index now 95th percentile. 2. Normal fluid volume, with growth at 61st percentile, 3482 g. We will proceed with induction of labor as previously planned FERGUSON,JOHN V   US Fetal Bpp Wo Non Stress  Result Date: 09/04/2016 FOLLOW UP SONOGRAM Tara Mcmahon is in the office for a follow up sonogram for EFW,BPP and cord doppler. She is a 20 y.o. year old G3P0111 with Estimated Date of Delivery: 09/03/16 by early ultrasound now at  [redacted]w[redacted]d weeks gestation. Thus far the  pregnancy has been complicated by cerclage,hx of preterm delivery.. GESTATION: SINGLETON PRESENTATION: cephalic FETAL ACTIVITY:          Heart rate         124          The fetus is active. AMNIOTIC FLUID: The amniotic fluid volume is  normal, 23.4 cm. PLACENTA LOCALIZATION:  posterior GRADE 3 CERVIX: Limited view ADNEXA: wnl GESTATIONAL AGE AND  BIOMETRICS: Gestational criteria: Estimated Date of Delivery: 09/03/16 by [place] now at [redacted]w[redacted]d Previous Scans:9 BIOPHYSCIAL PROFILE:                                                                                                      COMMENTS GROSS BODY MOVEMENT                 2  TONE                2  RESPIRATIONS                2  AMNIOTIC FLUID                2                                                          SCORE:  8/8 (Note: NST was not performed as part of this antepartum testing) DOPPLER FLOW STUDIES: UMBILICAL ARTERY RI RATIOS:   0.74, 0.76  >95th percentile          BIPARIETAL DIAMETER           9.65 cm         39+3 weeks HEAD CIRCUMFERENCE           34.97 cm         40+1 weeks ABDOMINAL CIRCUMFERENCE  34.31 cm         38+1 weeks FEMUR LENGTH           7.53 cm         38+4 weeks                                                       AVERAGE EGA(BY THIS SCAN):  39+1 weeks                                                 ESTIMATED FETAL WEIGHT:       3482  grams, 61 % ANATOMICAL SURVEY                                                                            COMMENTS CEREBRAL VENTRICLES yes normal  CHOROID PLEXUS    CEREBELLUM    CISTERNA MAGNA    NUCHAL REGION    ORBITS    NASAL BONE    NOSE/LIP yes normal  FACIAL PROFILE yes normal  4 CHAMBERED HEART yes normal  OUTFLOW TRACTS    DIAPHRAGM yes normal  STOMACH yes normal  RENAL REGION yes normal  BLADDER yes normal  CORD INSERTION    3 VESSEL CORD yes normal  SPINE    ARMS/HANDS    LEGS/FEET    GENITALIA yes normal female     SUSPECTED ABNORMALITIES:  yes  RI'S >95 TH percentile QUALITY OF SCAN:  satisfactory TECHNICIAN COMMENTS: [place] 38+6 wks,cephalic,fhr 124 bpm,bilat adnexa's wnl,BPP 8/8,RI .74,.76, RI'S >95TH percentile,post pl gr 3,afi 23.4 cm,EFW 3482 g 61% A copy of this report including all images has been saved and backed up to a second source for retrieval if needed. All measures and details of the anatomical scan, placentation, fluid volume and pelvic anatomy are contained in that report. Tara Mcmahon 08/26/2016 12:19 PM Clinical Impression and recommendations: I have reviewed the sonogram results above. Combined with the patient's current clinical course, below are my impressions and any appropriate recommendations for management based on the sonographic findings: 1. Term intrauterine pregnancy with BPP 8 out of 8 and increasin resistance index now 95th percentile. 2. Normal fluid volume, with growth at 61st percentile, 3482 g. We will proceed with induction of labor as previously planned FERGUSON,JOHN V   [place] Ua Cord Doppler  Result Date: 09/04/2016 FOLLOW UP SONOGRAM Helyn Schwan is in the office for a follow up sonogram for EFW,BPP and cord doppler. She is a 20 y.o. year old G3P0111 with Estimated Date of Delivery: 09/03/16 by early ultrasound now at  [redacted]w[redacted]d weeks gestation. Thus far the pregnancy has been complicated by cerclage,hx of preterm delivery.. GESTATION: SINGLETON PRESENTATION: cephalic FETAL ACTIVITY:          Heart rate         124          The fetus is active. AMNIOTIC FLUID: The  amniotic fluid volume is  normal, 23.4 cm. PLACENTA LOCALIZATION:  posterior GRADE 3 CERVIX: Limited view ADNEXA: wnl GESTATIONAL AGE AND  BIOMETRICS: Gestational criteria: Estimated Date of Delivery: 09/03/16 by [place] now at [redacted]w[redacted]d Previous Scans:9 BIOPHYSCIAL PROFILE:                                                                                                      COMMENTS GROSS BODY MOVEMENT                 2  TONE                2  RESPIRATIONS                2  AMNIOTIC FLUID                2                                                           SCORE:  8/8 (Note: NST was not performed as part of this antepartum testing) DOPPLER FLOW STUDIES: UMBILICAL ARTERY RI RATIOS:   0.74, 0.76  >95th percentile          BIPARIETAL DIAMETER           9.65 cm         39+3 weeks HEAD CIRCUMFERENCE           34.97 cm         40+1 weeks ABDOMINAL CIRCUMFERENCE           34.31 cm         38+1 weeks FEMUR LENGTH           7.53 cm         38+4 weeks                                                       AVERAGE EGA(BY THIS SCAN):  39+1 weeks                                                 ESTIMATED FETAL WEIGHT:       3482  grams, 61 % ANATOMICAL SURVEY  COMMENTS CEREBRAL VENTRICLES yes normal  CHOROID PLEXUS    CEREBELLUM    CISTERNA MAGNA    NUCHAL REGION    ORBITS    NASAL BONE    NOSE/LIP yes normal  FACIAL PROFILE yes normal  4 CHAMBERED HEART yes normal  OUTFLOW TRACTS    DIAPHRAGM yes normal  STOMACH yes normal  RENAL REGION yes normal  BLADDER yes normal  CORD INSERTION    3 VESSEL CORD yes normal  SPINE    ARMS/HANDS    LEGS/FEET    GENITALIA yes normal female     SUSPECTED ABNORMALITIES:  yes  RI'S >95 TH percentile QUALITY OF SCAN: satisfactory TECHNICIAN COMMENTS: [place] 38+6 wks,cephalic,fhr 124 bpm,bilat adnexa's wnl,BPP 8/8,RI .74,.76, RI'S >95TH percentile,post pl gr 3,afi 23.4 cm,EFW 3482 g 61% A copy of this report including all images has been saved and backed up to a second source for retrieval if needed. All measures and details of the anatomical scan, placentation, fluid volume and pelvic anatomy are contained in that report. Tara Mcmahon 08/26/2016 12:19 PM Clinical Impression and recommendations: I have reviewed the sonogram results above. Combined with the patient's current clinical course, below are my impressions and any appropriate recommendations for management based on the sonographic findings: 1. Term intrauterine pregnancy with  BPP 8 out of 8 and increasin resistance index now 95th percentile. 2. Normal fluid volume, with growth at 61st percentile, 3482 g. We will proceed with induction of labor as previously planned [facility] V   Dg Chest Port 1 View  Result Date: 09/11/2016 CLINICAL DATA:  Postpartum fever EXAM: PORTABLE CHEST 1 VIEW COMPARISON:  11/25/2015 FINDINGS: Normal heart size and mediastinal contours. No acute infiltrate or edema. No effusion or pneumothorax. No osseous findings. IMPRESSION: Negative portable chest. Electronically Signed   By: Marnee Spring M.D.   On: 09/11/2016 21:53    Subjective: Feels much better today. Eager to go home  Discharge Exam: Vitals:   09/13/16 0220 09/13/16 0555  BP:  120/72  Pulse:  89  Resp:  20  Temp: 98.9 F (37.2 C) 98.7 F (37.1 C)   Vitals:   09/12/16 2130 09/13/16 0220 09/13/16 0500 09/13/16 0555  BP: 132/77   120/72  Pulse: 100   89  Resp: 20   20  Temp: 98.9 F (37.2 C) 98.9 F (37.2 C)  98.7 F (37.1 C)  TempSrc: Oral Oral  Oral  SpO2: 99%   100%  Weight:   81.5 kg (179 lb 12 oz)   Height:        General: Pt is alert, awake, not in acute distress Cardiovascular: RRR, S1/S2 +, no rubs, no gallops Respiratory: CTA bilaterally, no wheezing, no rhonchi Abdominal: Soft, NT, ND, bowel sounds + Extremities: no edema, no cyanosis   The results of significant diagnostics from this hospitalization (including imaging, microbiology, ancillary and laboratory) are listed below for reference.     Microbiology: Recent Results (from the past 240 hour(s))  Blood culture (routine x 2)     Status: None (Preliminary result)   Collection Time: 09/11/16  7:04 PM  Result Value Ref Range Status   Specimen Description BLOOD RIGHT ANTECUBITAL DRAWN BY RN  Final   Special Requests BOTTLES DRAWN AEROBIC AND ANAEROBIC 6CC  Final   Culture NO GROWTH 2 DAYS  Final   Report Status PENDING  Incomplete  Blood culture (routine x 2)     Status: None (Preliminary  result)   Collection Time: 09/11/16  7:30  PM  Result Value Ref Range Status   Specimen Description BLOOD LEFT ANTECUBITAL  Final   Special Requests BOTTLES DRAWN AEROBIC AND ANAEROBIC 6CC  Final   Culture NO GROWTH 2 DAYS  Final   Report Status PENDING  Incomplete  Urine culture     Status: Abnormal   Collection Time: 09/11/16  8:20 PM  Result Value Ref Range Status   Specimen Description URINE, CLEAN CATCH  Final   Special Requests NONE  Final   Culture MULTIPLE SPECIES PRESENT, SUGGEST RECOLLECTION (A)  Final   Report Status 09/13/2016 FINAL  Final     Labs: BNP (last 3 results) No results for input(s): BNP in the last 8760 hours. Basic Metabolic Panel:  Recent Labs Lab 09/11/16 1903 09/12/16 0643  NA 134* 135  K 3.5 3.1*  CL 102 106  CO2 22 21*  GLUCOSE 117* 105*  BUN 13 8  CREATININE 0.92 0.77  CALCIUM 8.7* 7.6*   Liver Function Tests:  Recent Labs Lab 09/12/16 0643  AST 14*  ALT 19  ALKPHOS 85  BILITOT 0.5  PROT 6.3*  ALBUMIN 2.9*   No results for input(s): LIPASE, AMYLASE in the last 168 hours. No results for input(s): AMMONIA in the last 168 hours. CBC:  Recent Labs Lab 09/11/16 1903 09/12/16 0643 09/13/16 0608  WBC 28.0* 20.4* 10.3  NEUTROABS 25.8*  --   --   HGB 12.4 11.0* 11.0*  HCT 36.8 32.5* 33.0*  MCV 85.0 84.9 85.9  PLT 410* 380 340   Cardiac Enzymes: No results for input(s): CKTOTAL, CKMB, CKMBINDEX, TROPONINI in the last 168 hours. BNP: Invalid input(s): POCBNP CBG: No results for input(s): GLUCAP in the last 168 hours. D-Dimer No results for input(s): DDIMER in the last 72 hours. Hgb A1c No results for input(s): HGBA1C in the last 72 hours. Lipid Profile No results for input(s): CHOL, HDL, LDLCALC, TRIG, CHOLHDL, LDLDIRECT in the last 72 hours. Thyroid function studies No results for input(s): TSH, T4TOTAL, T3FREE, THYROIDAB in the last 72 hours.  Invalid input(s): FREET3 Anemia work up No results for input(s):  VITAMINB12, FOLATE, FERRITIN, TIBC, IRON, RETICCTPCT in the last 72 hours. Urinalysis    Component Value Date/Time   COLORURINE YELLOW 09/11/2016 2020   APPEARANCEUR CLEAR 09/11/2016 2020   APPEARANCEUR Clear 01/27/2016 0934   LABSPEC <1.005 (L) 09/11/2016 2020   PHURINE 5.5 09/11/2016 2020   GLUCOSEU NEGATIVE 09/11/2016 2020   HGBUR TRACE (A) 09/11/2016 2020   BILIRUBINUR NEGATIVE 09/11/2016 2020   BILIRUBINUR Negative 01/27/2016 0934   KETONESUR NEGATIVE 09/11/2016 2020   PROTEINUR NEGATIVE 09/11/2016 2020   NITRITE NEGATIVE 09/11/2016 2020   LEUKOCYTESUR MODERATE (A) 09/11/2016 2020   LEUKOCYTESUR Negative 01/27/2016 0934   Sepsis Labs Invalid input(s): PROCALCITONIN,  WBC,  LACTICIDVEN Microbiology Recent Results (from the past 240 hour(s))  Blood culture (routine x 2)     Status: None (Preliminary result)   Collection Time: 09/11/16  7:04 PM  Result Value Ref Range Status   Specimen Description BLOOD RIGHT ANTECUBITAL DRAWN BY RN  Final   Special Requests BOTTLES DRAWN AEROBIC AND ANAEROBIC 6CC  Final   Culture NO GROWTH 2 DAYS  Final   Report Status PENDING  Incomplete  Blood culture (routine x 2)     Status: None (Preliminary result)   Collection Time: 09/11/16  7:30 PM  Result Value Ref Range Status   Specimen Description BLOOD LEFT ANTECUBITAL  Final   Special Requests BOTTLES DRAWN AEROBIC AND ANAEROBIC 6CC  Final   Culture NO GROWTH 2 DAYS  Final   Report Status PENDING  Incomplete  Urine culture     Status: Abnormal   Collection Time: 09/11/16  8:20 PM  Result Value Ref Range Status   Specimen Description URINE, CLEAN CATCH  Final   Special Requests NONE  Final   Culture MULTIPLE SPECIES PRESENT, SUGGEST RECOLLECTION (A)  Final   Report Status 09/13/2016 FINAL  Final     SIGNED:   Jerald Kief, MD  [facility] 09/13/2016, 5:28 PM  If 7PM-7AM, please contact night-coverage [email or web address] Password [email or web address]

## 2016-09-13 NOTE — Progress Notes (Signed)
Patient with orders to be discharge home. Discharge instructions given, patient verbalized understanding. Prescriptions given. Patient stable. Patient left in private vehicle with husband.  

## 2016-09-14 ENCOUNTER — Encounter: Payer: Self-pay | Admitting: Obstetrics and Gynecology

## 2016-09-14 ENCOUNTER — Ambulatory Visit (INDEPENDENT_AMBULATORY_CARE_PROVIDER_SITE_OTHER): Payer: Medicaid Other | Admitting: Obstetrics and Gynecology

## 2016-09-14 VITALS — BP 140/72 | HR 72 | Ht 59.0 in | Wt 175.4 lb

## 2016-09-14 DIAGNOSIS — N61 Mastitis without abscess: Secondary | ICD-10-CM

## 2016-09-14 NOTE — Progress Notes (Signed)
Patient ID: Tara Mcmahon, female   DOB: 1996/02/27, 20 y.o.   MRN: [id number]    [facility] Clinic Visit  @DATE @            Patient name: Tara Mcmahon MRN [id number]  Date of birth: Feb 25, 1996  CC & HPI:   Chief Complaint  Patient presents with  . mastitis    Pt admitted for Mastitis given IV abx, rash inner thigh     Tara Mcmahon is a 20 y.o. female presenting today for f/u from admission to [facility] on 09/11/16 for tx of postpartum mastitis, leukocytosis. Pt states she was treated in the hospital with IV vancomycin and was discharged yesterday with Doxycycline. Per pt, her mastitis initially began 3 days ago. She was also prescribed Kenalog for pruritic rashes to her inner thigh.   Pt states she is no longer breast feeding.   ROS:  Review of Systems  Skin: Positive for itching and rash.    Pertinent History Reviewed:   Reviewed: Significant for PCOS Medical         Past Medical History:  Diagnosis Date  . Anxiety   . Cervical incompetence during pregnancy   . Depression   . Hypothyroidism   . PCOS (polycystic ovarian syndrome)                               Surgical Hx:    Past Surgical History:  Procedure Laterality Date  . CERVICAL CERCLAGE    . CERVICAL CERCLAGE N/A 03/09/2016   Procedure: [facility] CERCLAGE PLACEMENT;  Surgeon: Lazaro Arms, MD;  Location: [facility] ORS;  Service: Gynecology;  Laterality: N/A;  . labial reduction     Medications: Reviewed & Updated - see associated section                       Current Outpatient Prescriptions:  .  doxycycline (VIBRAMYCIN) 100 MG capsule, Take 1 capsule (100 mg total) by mouth daily., Disp: 5 capsule, Rfl: 0 .  ibuprofen (ADVIL,MOTRIN) 600 MG tablet, Take 1 tablet (600 mg total) by mouth every 6 (six) hours., Disp: 30 tablet, Rfl: 0 .  sertraline (ZOLOFT) 50 MG tablet, Take 1 tablet (50 mg total) by mouth daily., Disp: 30 tablet, Rfl: 2 .  triamcinolone ointment (KENALOG) 0.1 %, Apply topically 3 (three) times daily  as needed (for itching/rash)., Disp: 30 g, Rfl: 0   Social History: Reviewed -  reports that she has never smoked. She has never used smokeless tobacco.  Objective Findings:  Vitals: Blood pressure 140/72, pulse 72, height 4\' 11"  (1.499 m), weight 175 lb 6.4 oz (79.6 kg), not currently breastfeeding.  Physical Examination: General appearance - alert, well appearing, and in no distress Mental status - alert, oriented to person, place, and time Abdomen - soft, nontender, nondistended, no masses or organomegaly, extensive stria over entire abdomen with some areas of obvious excoriation from patient scratching.  Breasts - breasts appear normal, no suspicious masses, no skin or nipple changes or axillary nodes, normal for recent post-partum state  Skin - normal coloration and turgor, no rashes, no suspicious skin lesions noted   Assessment & Plan:   A:  1. F/u of hospital admission for tx of mastitis  2. Extensive stria over abdomen with areas of obvious excoriations from scratching   P:  1. Advised pt to try topical Benadryl, camphor and lidocaine for itching  2. F/u in  4 weeks    By signing my name below, I, Doreatha MartinEva Mathews, attest that this documentation has been prepared under the direction and in the presence of Tilda BurrowJohn V Berish Bohman, MD. Electronically Signed: Doreatha MartinEva Mathews, ED Scribe. 09/14/16. 3:33 PM.  I personally performed the services described in this documentation, which was SCRIBED in my presence. The recorded information has been reviewed and considered accurate. It has been edited as necessary during review. Tilda BurrowFERGUSON,Demyah Smyre V, MD

## 2016-09-17 LAB — CULTURE, BLOOD (ROUTINE X 2)
CULTURE: NO GROWTH
Culture: NO GROWTH

## 2016-09-20 ENCOUNTER — Telehealth: Payer: Self-pay | Admitting: Obstetrics and Gynecology

## 2016-09-20 NOTE — Telephone Encounter (Signed)
Pt states she saw Dr. Emelda FearFerguson on 09/14/2016 and had a rash at that time, requesting a Rx for a Lidocaine cream unable to find an OTC Lidocaine cream.

## 2016-09-22 ENCOUNTER — Ambulatory Visit (INDEPENDENT_AMBULATORY_CARE_PROVIDER_SITE_OTHER): Payer: Medicaid Other | Admitting: Obstetrics and Gynecology

## 2016-09-22 ENCOUNTER — Encounter: Payer: Self-pay | Admitting: Obstetrics and Gynecology

## 2016-09-22 VITALS — BP 120/80 | HR 72 | Wt 173.6 lb

## 2016-09-22 DIAGNOSIS — N61 Mastitis without abscess: Secondary | ICD-10-CM

## 2016-09-22 MED ORDER — LIDOCAINE 5 % EX OINT: 1.0000 "application " | TOPICAL_OINTMENT | CUTANEOUS | 1 refills | Status: DC | PRN

## 2016-09-22 NOTE — Progress Notes (Addendum)
  Subjective:  Tara Mcmahon is a 20 y.o. female who presents for a 3 weeks postpartum visit  Patient concerns: resolving mastitis on left breast  Prenatal and intrapartum course notable    Patient is not sexually active.   The following portions of the patient's history were reviewed and updated as appropriate: allergies, current medications, past family history, past medical history, past surgical history and problem list.  Review of Systems    See Subjective, otherwise negative ROS.                    Depression score low  Objective:  BP 120/80 (BP Location: Right Arm, Patient Position: Sitting, Cuff Size: Normal)   Pulse 72   Wt 173 lb 9.6 oz (78.7 kg)   Breastfeeding? No   BMI 35.06 kg/m   General:  alert, cooperative and no distress     Lungs: clear to auscultation bilaterally  Heart:  regular rate and rhythm, S1, S2 normal, no murmur  Abdomen: soft, non-tender; bowel sounds normal; no masses,  no organomegaly   Vulva:  normal  Vagina: normal vagina single stitch in left introitus, pt declines offer of removal  Cervix:  normal  Corpus: normal size, contour, position, consistency, mobility, non-tender  Adnexa:  normal adnexa          Assessment:  1.  postpartum exam.  2. Contraception: spermicide and condoms 3.  Residual stitch in obstetric laceration. Will allow spontaneous resolution   Plan:  PRN or 1 year  By signing my name below, I, Sonum Patel, attest that this documentation has been prepared under the direction and in the presence of Tilda BurrowJohn V Chequita Mofield, MD. Electronically Signed: Sonum Patel, Neurosurgeoncribe. 09/22/16. 4:00 PM.  I personally performed the services described in this documentation, which was SCRIBED in my presence. The recorded information has been reviewed and considered accurate. It has been edited as necessary during review. Tilda BurrowFERGUSON,Kristoff Coonradt V, MD

## 2016-09-23 ENCOUNTER — Ambulatory Visit: Payer: Medicaid Other | Admitting: Women's Health

## 2016-09-23 ENCOUNTER — Telehealth (HOSPITAL_COMMUNITY): Payer: Self-pay | Admitting: Lactation Services

## 2016-09-23 NOTE — Telephone Encounter (Signed)
Tara Mcmahon called asking for guidance on weaning from BFing due to severe mastitis that lead to sepsis diagnosed at 12 days post partum.  Hospitalized for 2 days on IV antibiotics.  Tara Mcmahon has been spacing out her pumping every other day, and her breasts are feeling much softer.  Baby is 84 weeks old now.  Expressing twice a day 12 hrs apart.  Only to express the non-affected side, as the side that had mastitis has involuted already.  Tara Mcmahon knows not to empty her breast, but to pump off for comfort.  Talked about using cold cabbage to assist with this.  To call prn for any other questions.

## 2016-11-08 ENCOUNTER — Ambulatory Visit: Payer: Medicaid Other | Admitting: Obstetrics & Gynecology

## 2016-11-09 ENCOUNTER — Telehealth: Payer: Self-pay | Admitting: Obstetrics & Gynecology

## 2016-11-09 NOTE — Telephone Encounter (Signed)
Pt c/o menstrual cycle x 2 weeks, at times real light and at other times heavier. 1st period since she delivered. Pt informed can have a heavier period and longer after delivery but if bleeding has not stopped by next week discuss with the provider at her appt. Pt verbalized understanding.

## 2016-11-15 ENCOUNTER — Ambulatory Visit (INDEPENDENT_AMBULATORY_CARE_PROVIDER_SITE_OTHER): Payer: Medicaid Other | Admitting: Obstetrics & Gynecology

## 2016-11-15 ENCOUNTER — Encounter: Payer: Self-pay | Admitting: Obstetrics & Gynecology

## 2016-11-15 ENCOUNTER — Telehealth: Payer: Self-pay | Admitting: *Deleted

## 2016-11-15 VITALS — BP 124/80 | HR 82 | Wt 189.0 lb

## 2016-11-15 DIAGNOSIS — R21 Rash and other nonspecific skin eruption: Secondary | ICD-10-CM | POA: Diagnosis not present

## 2016-11-15 MED ORDER — LEVOCETIRIZINE DIHYDROCHLORIDE 5 MG PO TABS: 5.0000 mg | ORAL_TABLET | Freq: Every evening | ORAL | 11 refills | Status: DC

## 2016-11-15 MED ORDER — RANITIDINE HCL 150 MG PO TABS: 150.0000 mg | ORAL_TABLET | Freq: Two times a day (BID) | ORAL | 11 refills | Status: DC

## 2016-11-15 MED ORDER — DESOGESTREL-ETHINYL ESTRADIOL 0.15-30 MG-MCG PO TABS: 1.0000 | ORAL_TABLET | Freq: Every day | ORAL | 11 refills | Status: DC

## 2016-11-15 NOTE — Telephone Encounter (Signed)
Pt saw Dr. Despina HiddenEure and prescribed Levocetirizine 5 mg. That requires a prior auth. I called pharmacy and was told gen Zyrtec and gen Claritin don't require a PA. I spoke with Dr. Despina HiddenEure and he ordered gen Zyrtec 10 mg #30 1 daily in the evening with prn refills. Pt aware of med change. JSY

## 2016-11-16 LAB — ANA: Anti Nuclear Antibody(ANA): NEGATIVE

## 2016-12-28 ENCOUNTER — Telehealth: Payer: Self-pay | Admitting: *Deleted

## 2016-12-28 ENCOUNTER — Telehealth: Payer: Self-pay | Admitting: Obstetrics and Gynecology

## 2016-12-28 NOTE — Telephone Encounter (Signed)
Patient called stating she is still having moderate bleeding at times with mild cramping. She has only been taking her birth controls pills for one month. Ad[facility] can take a while to regulate hormones and to get through at least 2 more packs to see if the bleeding gets better. Pt verbalized understanding.

## 2016-12-28 NOTE — Telephone Encounter (Signed)
Pt called stating that she would like to speak with a nurse, Pt did not state the reason why. Please contact pt °

## 2016-12-28 NOTE — Telephone Encounter (Signed)
Patient called back stating she is still having bleeding with small clots, concerned she has retained placental parts. Spoke with Drenda FreezeFran who stated the likelihood of retained parts this far out is unlikely since she did stop bleeding for 4 weeks before she started bleeding again. Advised to continue taking birth control pills and to get through 2 more packs. If bleeding continues after the 3rd pack to give us a call back. Pt verbalized understanding.

## 2017-01-08 NOTE — Progress Notes (Signed)
Chief Complaint  Patient presents with  . Rash    around joints area.    Blood pressure 124/80, pulse 82, weight 189 lb (85.7 kg), not currently breastfeeding.  21 y.o. [id number] No LMP recorded. The current method of family planning is .  Outpatient Encounter Prescriptions as of 11/15/2016  Medication Sig  . triamcinolone ointment (KENALOG) 0.1 % Apply topically 3 (three) times daily as needed (for itching/rash).  Marland Kitchen desogestrel-ethinyl estradiol (APRI,EMOQUETTE,SOLIA) 0.15-30 MG-MCG tablet Take 1 tablet by mouth daily.  Marland Kitchen levocetirizine (XYZAL) 5 MG tablet Take 1 tablet (5 mg total) by mouth every evening.  . ranitidine (ZANTAC) 150 MG tablet Take 1 tablet (150 mg total) by mouth 2 (two) times daily.  . [DISCONTINUED] lidocaine (XYLOCAINE) 5 % ointment Apply 1 application topically as needed. (Patient not taking: Reported on 11/15/2016)  . [DISCONTINUED] sertraline (ZOLOFT) 50 MG tablet Take 1 tablet (50 mg total) by mouth daily.   No facility-administered encounter medications on file as of 11/15/2016.     Subjective Pt has concern of RA due to mother's history Rash is insignificant Probably anti IgE  Objective Maculopapular rash  Pertinent ROS   Labs or studies     Impression Diagnoses this Encounter::   ICD-9-CM ICD-10-CM   1. Rash 782.1 R21 Antinuclear Antib (ANA)    Established relevant diagnosis(es):   Plan/Recommendations: Meds ordered this encounter  Medications  . levocetirizine (XYZAL) 5 MG tablet    Sig: Take 1 tablet (5 mg total) by mouth every evening.    Dispense:  30 tablet    Refill:  11  . ranitidine (ZANTAC) 150 MG tablet    Sig: Take 1 tablet (150 mg total) by mouth 2 (two) times daily.    Dispense:  60 tablet    Refill:  11  . desogestrel-ethinyl estradiol (APRI,EMOQUETTE,SOLIA) 0.15-30 MG-MCG tablet    Sig: Take 1 tablet by mouth daily.    Dispense:  1 Package    Refill:  11    Labs or Scans Ordered: Orders Placed This  Encounter  Procedures  . Antinuclear Antib (ANA)    Management:: Treat as if anti IgE  Follow up No Follow-up on file.        Face to face time:  15 minutes  Greater than 50% of the visit time was spent in counseling and coordination of care with the patient.  The summary and outline of the counseling and care coordination is summarized in the note above.   All questions were answered.  Past Medical History:  Diagnosis Date  . Anxiety   . Cervical incompetence during pregnancy   . Depression   . Hypothyroidism   . PCOS (polycystic ovarian syndrome)     Past Surgical History:  Procedure Laterality Date  . CERVICAL CERCLAGE    . CERVICAL CERCLAGE N/A 03/09/2016   Procedure: [facility] CERCLAGE PLACEMENT;  Surgeon: Lazaro Arms, MD;  Location: [facility];  Service: Gynecology;  Laterality: N/A;  . labial reduction      OB History    Gravida Para Term Preterm AB Living   3 2 1 1 1 2    SAB TAB Ectopic Multiple Live Births   1     0 2      No Known Allergies  Social History   Social History  . Marital status: Married    Spouse name: N/A  . Number of children: N/A  . Years of education: N/A   Social History Main  Topics  . Smoking status: Never Smoker  . Smokeless tobacco: Never Used  . Alcohol use No  . Drug use: No  . Sexual activity: Not Currently    Birth control/ protection: None   Other Topics Concern  . None   Social History Narrative  . None    Family History  Problem Relation Age of Onset  . Arthritis Mother   . Lupus Mother   . Autoimmune disease Mother   . Cancer Maternal Grandmother     breast  . Heart disease Maternal Grandfather   . Heart disease Paternal Grandfather   . Hypertension Paternal Grandfather

## 2017-05-26 ENCOUNTER — Other Ambulatory Visit: Payer: Medicaid Other | Admitting: Women's Health

## 2017-06-23 ENCOUNTER — Encounter: Payer: Self-pay | Admitting: Adult Health

## 2017-06-23 ENCOUNTER — Ambulatory Visit (INDEPENDENT_AMBULATORY_CARE_PROVIDER_SITE_OTHER): Payer: Medicaid Other | Admitting: Adult Health

## 2017-06-23 VITALS — BP 110/60 | HR 73 | Ht 59.0 in | Wt 189.5 lb

## 2017-06-23 DIAGNOSIS — O3680X0 Pregnancy with inconclusive fetal viability, not applicable or unspecified: Secondary | ICD-10-CM | POA: Insufficient documentation

## 2017-06-23 DIAGNOSIS — Z309 Encounter for contraceptive management, unspecified: Secondary | ICD-10-CM | POA: Diagnosis not present

## 2017-06-23 DIAGNOSIS — Z3009 Encounter for other general counseling and advice on contraception: Secondary | ICD-10-CM

## 2017-06-23 DIAGNOSIS — N921 Excessive and frequent menstruation with irregular cycle: Secondary | ICD-10-CM

## 2017-06-23 DIAGNOSIS — Z01419 Encounter for gynecological examination (general) (routine) without abnormal findings: Secondary | ICD-10-CM

## 2017-06-23 DIAGNOSIS — N946 Dysmenorrhea, unspecified: Secondary | ICD-10-CM

## 2017-06-23 DIAGNOSIS — Z8742 Personal history of other diseases of the female genital tract: Secondary | ICD-10-CM | POA: Insufficient documentation

## 2017-06-23 DIAGNOSIS — Z3041 Encounter for surveillance of contraceptive pills: Secondary | ICD-10-CM | POA: Insufficient documentation

## 2017-06-23 NOTE — Progress Notes (Signed)
Patient ID: Tara Mcmahon, female   DOB: 02/06/1996, 21 y.o.   MRN: [id number] History of Present Illness:  Tara Mcmahon is a 21 year old white female, married in for a well woman gyn exam, she had a normal pap 01/27/16.She has Family planning medicaid.She has history of PCOs and is on OCs but periods are heavy and painful at times.   Current Medications, Allergies, Past Medical History, Past Surgical History, Family History and Social History were reviewed in [facility] electronic medical record.     Review of Systems: Patient denies any headaches, hearing loss, fatigue, blurred vision, shortness of breath, chest pain, abdominal pain, problems with bowel movements, urination, or intercourse. No joint pain or mood swings. See HPI for positives.     Physical Exam:BP 110/60 (BP Location: Left Arm, Patient Position: Sitting, Cuff Size: Normal)   Pulse 73   Ht 4\' 11"  (1.499 m)   Wt 189 lb 8 oz (86 kg)   LMP 06/15/2017   Breastfeeding? No   BMI 38.27 kg/m  General:  Well developed, well nourished, no acute distress Skin:  Warm and dry Neck:  Midline trachea, normal thyroid, good ROM, no lymphadenopathy Lungs; Clear to auscultation bilaterally Breast:  No dominant palpable mass, retraction, or nipple discharge Cardiovascular: Regular rate and rhythm Abdomen:  Soft, non tender, no hepatosplenomegaly Pelvic:  External genitalia is normal in appearance, no lesions.  The vagina is normal in appearance. Urethra has no lesions or masses. The cervix is bulbous.  Uterus is felt to be normal size, shape, and contour.  No adnexal masses or tenderness noted.Bladder is non tender, no masses felt. GC/CHL obtained.  Extremities/musculoskeletal:  No swelling or varicosities noted, no clubbing or cyanosis Psych:  No mood changes, alert and cooperative,seems happy PHQ 2 score 0.She declines changing OCs right now, may want to stop and try to get pregnant in near future.   Impression: 1. Well female exam  with routine gynecological exam   2. Family planning   3. Menorrhagia with irregular cycle   4. Dysmenorrhea   5. History of PCOS   6. Encounter for surveillance of contraceptive pills       Plan:  GC/CHL sent Check HIV and RPR Continue current OCs for now Physical in 1 year, pap in 2020 Call if wants another OC, or is going to stop to try to get pregnant

## 2017-06-24 LAB — HIV ANTIBODY (ROUTINE TESTING W REFLEX): HIV SCREEN 4TH GENERATION: NONREACTIVE

## 2017-06-24 LAB — RPR: RPR Ser Ql: NONREACTIVE

## 2017-06-27 LAB — GC/CHLAMYDIA PROBE AMP
Chlamydia trachomatis, NAA: NEGATIVE
Neisseria gonorrhoeae by PCR: NEGATIVE

## 2017-07-19 ENCOUNTER — Telehealth: Payer: Self-pay | Admitting: *Deleted

## 2017-07-19 NOTE — Telephone Encounter (Signed)
Spoke with pt. Pt has PCOS. She has had a + ovulation test. I advised to take a pregnancy test in 1-2 weeks and see what result shows. Pt voiced understanding. JSY

## 2017-10-12 ENCOUNTER — Ambulatory Visit: Payer: Medicaid Other | Admitting: Adult Health

## 2017-10-22 ENCOUNTER — Other Ambulatory Visit: Payer: Self-pay

## 2017-10-22 ENCOUNTER — Emergency Department (HOSPITAL_COMMUNITY): Payer: Medicaid Other

## 2017-10-22 ENCOUNTER — Emergency Department (HOSPITAL_COMMUNITY)
Admission: EM | Admit: 2017-10-22 | Discharge: 2017-10-22 | Disposition: A | Discharge status: Home / Self Care | Payer: Medicaid Other | Attending: Emergency Medicine | Admitting: Emergency Medicine

## 2017-10-22 ENCOUNTER — Encounter (HOSPITAL_COMMUNITY): Payer: Self-pay | Admitting: Emergency Medicine

## 2017-10-22 DIAGNOSIS — R079 Chest pain, unspecified: Secondary | ICD-10-CM | POA: Diagnosis present

## 2017-10-22 DIAGNOSIS — Z5321 Procedure and treatment not carried out due to patient leaving prior to being seen by health care provider: Secondary | ICD-10-CM | POA: Insufficient documentation

## 2017-10-22 NOTE — ED Triage Notes (Signed)
Chest pain x 3 hours. Pt reports cp on and off for months.

## 2017-11-22 ENCOUNTER — Emergency Department (HOSPITAL_COMMUNITY): Admission: EM | Admit: 2017-11-22 | Discharge: 2017-11-22 | Discharge status: Against Medical Advice | Payer: Self-pay

## 2017-11-23 ENCOUNTER — Encounter: Payer: Self-pay | Admitting: Adult Health

## 2017-11-23 ENCOUNTER — Ambulatory Visit: Payer: Medicaid Other | Admitting: Adult Health

## 2017-11-23 VITALS — BP 124/62 | HR 108 | Ht 59.0 in | Wt 205.0 lb

## 2017-11-23 DIAGNOSIS — O09299 Supervision of pregnancy with other poor reproductive or obstetric history, unspecified trimester: Secondary | ICD-10-CM

## 2017-11-23 DIAGNOSIS — O09899 Supervision of other high risk pregnancies, unspecified trimester: Secondary | ICD-10-CM

## 2017-11-23 DIAGNOSIS — O3680X0 Pregnancy with inconclusive fetal viability, not applicable or unspecified: Secondary | ICD-10-CM

## 2017-11-23 DIAGNOSIS — N926 Irregular menstruation, unspecified: Secondary | ICD-10-CM

## 2017-11-23 DIAGNOSIS — Z3201 Encounter for pregnancy test, result positive: Secondary | ICD-10-CM

## 2017-11-23 DIAGNOSIS — Z3490 Encounter for supervision of normal pregnancy, unspecified, unspecified trimester: Secondary | ICD-10-CM

## 2017-11-23 DIAGNOSIS — O09219 Supervision of pregnancy with history of pre-term labor, unspecified trimester: Secondary | ICD-10-CM

## 2017-11-23 DIAGNOSIS — Z8742 Personal history of other diseases of the female genital tract: Secondary | ICD-10-CM | POA: Diagnosis not present

## 2017-11-23 LAB — POCT URINE PREGNANCY: Preg Test, Ur: POSITIVE — AB

## 2017-11-23 NOTE — Patient Instructions (Signed)
First Trimester of Pregnancy The first trimester of pregnancy is from week 1 until the end of week 13 (months 1 through 3). A week after a sperm fertilizes an egg, the egg will implant on the wall of the uterus. This embryo will begin to develop into a baby. Genes from you and your partner will form the baby. The female genes will determine whether the baby will be a boy or a girl. At 6-8 weeks, the eyes and face will be formed, and the heartbeat can be seen on ultrasound. At the end of 12 weeks, all the baby's organs will be formed. Now that you are pregnant, you will want to do everything you can to have a healthy baby. Two of the most important things are to get good prenatal care and to follow your health care provider's instructions. Prenatal care is all the medical care you receive before the baby's birth. This care will help prevent, find, and treat any problems during the pregnancy and childbirth. Body changes during your first trimester Your body goes through many changes during pregnancy. The changes vary from woman to woman.  You may gain or lose a couple of pounds at first.  You may feel sick to your stomach (nauseous) and you may throw up (vomit). If the vomiting is uncontrollable, call your health care provider.  You may tire easily.  You may develop headaches that can be relieved by medicines. All medicines should be approved by your health care provider.  You may urinate more often. Painful urination may mean you have a bladder infection.  You may develop heartburn as a result of your pregnancy.  You may develop constipation because certain hormones are causing the muscles that push stool through your intestines to slow down.  You may develop hemorrhoids or swollen veins (varicose veins).  Your breasts may begin to grow larger and become tender. Your nipples may stick out more, and the tissue that surrounds them (areola) may become darker.  Your gums may bleed and may be  sensitive to brushing and flossing.  Dark spots or blotches (chloasma, mask of pregnancy) may develop on your face. This will likely fade after the baby is born.  Your menstrual periods will stop.  You may have a loss of appetite.  You may develop cravings for certain kinds of food.  You may have changes in your emotions from day to day, such as being excited to be pregnant or being concerned that something may go wrong with the pregnancy and baby.  You may have more vivid and strange dreams.  You may have changes in your hair. These can include thickening of your hair, rapid growth, and changes in texture. Some women also have hair loss during or after pregnancy, or hair that feels dry or thin. Your hair will most likely return to normal after your baby is born.  What to expect at prenatal visits During a routine prenatal visit:  You will be weighed to make sure you and the baby are growing normally.  Your blood pressure will be taken.  Your abdomen will be measured to track your baby's growth.  The fetal heartbeat will be listened to between weeks 10 and 14 of your pregnancy.  Test results from any previous visits will be discussed.  Your health care provider may ask you:  How you are feeling.  If you are feeling the baby move.  If you have had any abnormal symptoms, such as leaking fluid, bleeding, severe headaches,   or abdominal cramping.  If you are using any tobacco products, including cigarettes, chewing tobacco, and electronic cigarettes.  If you have any questions.  Other tests that may be performed during your first trimester include:  Blood tests to find your blood type and to check for the presence of any previous infections. The tests will also be used to check for low iron levels (anemia) and protein on red blood cells (Rh antibodies). Depending on your risk factors, or if you previously had diabetes during pregnancy, you may have tests to check for high blood  sugar that affects pregnant women (gestational diabetes).  Urine tests to check for infections, diabetes, or protein in the urine.  An ultrasound to confirm the proper growth and development of the baby.  Fetal screens for spinal cord problems (spina bifida) and Down syndrome.  HIV (human immunodeficiency virus) testing. Routine prenatal testing includes screening for HIV, unless you choose not to have this test.  You may need other tests to make sure you and the baby are doing well.  Follow these instructions at home: Medicines  Follow your health care provider's instructions regarding medicine use. Specific medicines may be either safe or unsafe to take during pregnancy.  Take a prenatal vitamin that contains at least 600 micrograms (mcg) of folic acid.  If you develop constipation, try taking a stool softener if your health care provider approves. Eating and drinking  Eat a balanced diet that includes fresh fruits and vegetables, whole grains, good sources of protein such as meat, eggs, or tofu, and low-fat dairy. Your health care provider will help you determine the amount of weight gain that is right for you.  Avoid raw meat and uncooked cheese. These carry germs that can cause birth defects in the baby.  Eating four or five small meals rather than three large meals a day may help relieve nausea and vomiting. If you start to feel nauseous, eating a few soda crackers can be helpful. Drinking liquids between meals, instead of during meals, also seems to help ease nausea and vomiting.  Limit foods that are high in fat and processed sugars, such as fried and sweet foods.  To prevent constipation: ? Eat foods that are high in fiber, such as fresh fruits and vegetables, whole grains, and beans. ? Drink enough fluid to keep your urine clear or pale yellow. Activity  Exercise only as directed by your health care provider. Most women can continue their usual exercise routine during  pregnancy. Try to exercise for 30 minutes at least 5 days a week. Exercising will help you: ? Control your weight. ? Stay in shape. ? Be prepared for labor and delivery.  Experiencing pain or cramping in the lower abdomen or lower back is a good sign that you should stop exercising. Check with your health care provider before continuing with normal exercises.  Try to avoid standing for long periods of time. Move your legs often if you must stand in one place for a long time.  Avoid heavy lifting.  Wear low-heeled shoes and practice good posture.  You may continue to have sex unless your health care provider tells you not to. Relieving pain and discomfort  Wear a good support bra to relieve breast tenderness.  Take warm sitz baths to soothe any pain or discomfort caused by hemorrhoids. Use hemorrhoid cream if your health care provider approves.  Rest with your legs elevated if you have leg cramps or low back pain.  If you develop   varicose veins in your legs, wear support hose. Elevate your feet for 15 minutes, 3-4 times a day. Limit salt in your diet. Prenatal care  Schedule your prenatal visits by the twelfth week of pregnancy. They are usually scheduled monthly at first, then more often in the last 2 months before delivery.  Write down your questions. Take them to your prenatal visits.  Keep all your prenatal visits as told by your health care provider. This is important. Safety  Wear your seat belt at all times when driving.  Make a list of emergency phone numbers, including numbers for family, friends, the hospital, and police and fire departments. General instructions  Ask your health care provider for a referral to a local prenatal education class. Begin classes no later than the beginning of month 6 of your pregnancy.  Ask for help if you have counseling or nutritional needs during pregnancy. Your health care provider can offer advice or refer you to specialists for help  with various needs.  Do not use hot tubs, steam rooms, or saunas.  Do not douche or use tampons or scented sanitary pads.  Do not cross your legs for long periods of time.  Avoid cat litter boxes and soil used by cats. These carry germs that can cause birth defects in the baby and possibly loss of the fetus by miscarriage or stillbirth.  Avoid all smoking, herbs, alcohol, and medicines not prescribed by your health care provider. Chemicals in these products affect the formation and growth of the baby.  Do not use any products that contain nicotine or tobacco, such as cigarettes and e-cigarettes. If you need help quitting, ask your health care provider. You may receive counseling support and other resources to help you quit.  Schedule a dentist appointment. At home, brush your teeth with a soft toothbrush and be gentle when you floss. Contact a health care provider if:  You have dizziness.  You have mild pelvic cramps, pelvic pressure, or nagging pain in the abdominal area.  You have persistent nausea, vomiting, or diarrhea.  You have a bad smelling vaginal discharge.  You have pain when you urinate.  You notice increased swelling in your face, hands, legs, or ankles.  You are exposed to fifth disease or chickenpox.  You are exposed to German measles (rubella) and have never had it. Get help right away if:  You have a fever.  You are leaking fluid from your vagina.  You have spotting or bleeding from your vagina.  You have severe abdominal cramping or pain.  You have rapid weight gain or loss.  You vomit blood or material that looks like coffee grounds.  You develop a severe headache.  You have shortness of breath.  You have any kind of trauma, such as from a fall or a car accident. Summary  The first trimester of pregnancy is from week 1 until the end of week 13 (months 1 through 3).  Your body goes through many changes during pregnancy. The changes vary from  woman to woman.  You will have routine prenatal visits. During those visits, your health care provider will examine you, discuss any test results you may have, and talk with you about how you are feeling. This information is not intended to replace advice given to you by your health care provider. Make sure you discuss any questions you have with your health care provider. Document Released: 11/08/2001 Document Revised: 10/26/2016 Document Reviewed: 10/26/2016 Elsevier Interactive Patient Education  2018 Elsevier   Inc.  

## 2017-11-23 NOTE — Progress Notes (Signed)
Subjective:     Patient ID: Shannan HarperAshleigh Consalvo, female   DOB: Oct 05, 1996, 21 y.o.   MRN: [id number]  HPI Apolonio Schneidersshleigh is a 21 year old white female in for UPT, has had +HPT. Has History of PCOs, not sure of period.Has history of preterm delivery and cervical  Incompetence.  Review of Systems +missed period with +HPT +cramps +nipple tenderness + tired Reviewed past medical,surgical, social and family history. Reviewed medications and allergies.     Objective:   Physical Exam BP 124/62 (BP Location: Left Arm, Patient Position: Sitting, Cuff Size: Large)   Pulse (!) 108   Ht 4\' 11"  (1.499 m)   Wt 205 lb (93 kg)   LMP 10/12/2017 (Approximate)   Breastfeeding? No   BMI 41.40 kg/m UPT+, about 6 weeks by LMP with EDD 07/19/18.Skin warm and dry. Neck: mid line trachea, normal thyroid, good ROM, no lymphadenopathy noted. Lungs: clear to ausculation bilaterally. Cardiovascular: regular rate and rhythm.Abdomen is soft and non tender.     Assessment:     1. Pregnancy examination or test, positive result   2. Pregnancy, unspecified gestational age   543. Encounter to determine fetal viability of pregnancy, single or unspecified fetus   4. History of preterm delivery, currently pregnant   5. History of cervical incompetence in pregnancy, currently pregnant   6. History of PCOS       Plan:     Take Gummies Return in 1 week for dating US Review handouts on First trimester and by [facility]

## 2017-11-27 ENCOUNTER — Telehealth: Payer: Self-pay | Admitting: *Deleted

## 2017-11-27 ENCOUNTER — Encounter: Payer: Self-pay | Admitting: Adult Health

## 2017-11-27 ENCOUNTER — Ambulatory Visit (INDEPENDENT_AMBULATORY_CARE_PROVIDER_SITE_OTHER): Payer: Medicaid Other | Admitting: Adult Health

## 2017-11-27 VITALS — BP 110/60 | HR 78 | Resp 16 | Ht 59.0 in | Wt 206.0 lb

## 2017-11-27 DIAGNOSIS — F4321 Adjustment disorder with depressed mood: Secondary | ICD-10-CM | POA: Insufficient documentation

## 2017-11-27 DIAGNOSIS — F4320 Adjustment disorder, unspecified: Secondary | ICD-10-CM | POA: Insufficient documentation

## 2017-11-27 DIAGNOSIS — R109 Unspecified abdominal pain: Secondary | ICD-10-CM | POA: Diagnosis not present

## 2017-11-27 DIAGNOSIS — Z3490 Encounter for supervision of normal pregnancy, unspecified, unspecified trimester: Secondary | ICD-10-CM

## 2017-11-27 NOTE — Progress Notes (Signed)
Subjective:     Patient ID: Shannan HarperAshleigh Cauthorn, female   DOB: Oct 24, 1996, 21 y.o.   MRN: [id number]  HPI Apolonio Schneidersshleigh is a 21 year old white female in complaining of cramping and has headache, teary over friends death today by MVA,she wants to have US to check baby out.   Review of Systems +cramping Headache over being upset friend died in MVA this am Reviewed past medical,surgical, social and family history. Reviewed medications and allergies.     Objective:   Physical Exam BP 110/60 (BP Location: Left Arm, Patient Position: Sitting, Cuff Size: Normal)   Pulse 78   Resp 16   Ht 4\' 11"  (1.499 m)   Wt 206 lb (93.4 kg)   LMP 10/12/2017 (Approximate)   BMI 41.61 kg/m    Skin warm and dry, point of care US performed, has small intrauterine sac with YS and fetal pole, no FHM seen, it is early, keep appt for Monday for dating US.   Assessment:     1. Grief reaction   2. Pregnancy, unspecified gestational age   693. Abdominal cramps       Plan:     Push fluids OK to take tylenol Follow up as scheduled  for [place]

## 2017-11-27 NOTE — Telephone Encounter (Signed)
Patient called crying stating she just found out her best friend died and wants to know if stress can be harmful to the baby. Informed that dehydration from the stress could cause cramping but to push fluids and monitor for bleeding and cramping. Pt begging to be seen for FHTbut informed patient at 6 weeks it would be difficult to obtain with doppler and Amber did not have any available appointments today.  Pt asking if she could please be worked in for bedside U/S. Spoke to GilletteJennifer who stated patient could come at 3:15 but to inform her that bedside U/S still may not pick up anything and to make patient aware. Pt informed and stated she understood and would be here later.

## 2017-12-04 ENCOUNTER — Ambulatory Visit (INDEPENDENT_AMBULATORY_CARE_PROVIDER_SITE_OTHER): Payer: Medicaid Other

## 2017-12-04 ENCOUNTER — Telehealth: Payer: Self-pay | Admitting: *Deleted

## 2017-12-04 DIAGNOSIS — Z3A01 Less than 8 weeks gestation of pregnancy: Secondary | ICD-10-CM

## 2017-12-04 DIAGNOSIS — O3680X0 Pregnancy with inconclusive fetal viability, not applicable or unspecified: Secondary | ICD-10-CM | POA: Diagnosis not present

## 2017-12-04 NOTE — Telephone Encounter (Signed)
Patient called stating she is moving to [place] March 1 and is asking if she can get cerclage placed for incompetent cervix before 14 weeks. She has already communicated with an OBGYN there but they are saying it would be over a month before she could have it placed.  Has a new OB appt 1/16. Please advise.

## 2017-12-04 NOTE — Progress Notes (Signed)
US 6+3 wks,single IUP w/ys,positive fht 108 bpm,normal ovaries bilat,crl 6 mm,EDD 07/27/2018 by [place]

## 2017-12-05 NOTE — Telephone Encounter (Signed)
Informed patient that per Dr Eure,cerclages are not placed before 14 weeks because of the inherent risk of early pregnancy loss.  Informed he would do at 14 weeks and then she could move but should not do any lifting.  Advised to keep new ob appt and we would go from there.

## 2017-12-05 NOTE — Telephone Encounter (Signed)
Cerclages are not placed before 14 weeks because of the inherent risk of early pregnancy loss, so no not before 14 weeks

## 2017-12-13 ENCOUNTER — Ambulatory Visit (INDEPENDENT_AMBULATORY_CARE_PROVIDER_SITE_OTHER): Payer: Medicaid Other | Admitting: Advanced Practice Midwife

## 2017-12-13 ENCOUNTER — Encounter: Payer: Self-pay | Admitting: Advanced Practice Midwife

## 2017-12-13 ENCOUNTER — Ambulatory Visit: Payer: Medicaid Other | Admitting: *Deleted

## 2017-12-13 VITALS — BP 140/82 | HR 105 | Wt 201.0 lb

## 2017-12-13 DIAGNOSIS — E039 Hypothyroidism, unspecified: Secondary | ICD-10-CM

## 2017-12-13 DIAGNOSIS — Z331 Pregnant state, incidental: Secondary | ICD-10-CM | POA: Diagnosis not present

## 2017-12-13 DIAGNOSIS — Z3481 Encounter for supervision of other normal pregnancy, first trimester: Secondary | ICD-10-CM

## 2017-12-13 DIAGNOSIS — Z3682 Encounter for antenatal screening for nuchal translucency: Secondary | ICD-10-CM | POA: Diagnosis not present

## 2017-12-13 DIAGNOSIS — O99281 Endocrine, nutritional and metabolic diseases complicating pregnancy, first trimester: Secondary | ICD-10-CM

## 2017-12-13 DIAGNOSIS — Z3490 Encounter for supervision of normal pregnancy, unspecified, unspecified trimester: Secondary | ICD-10-CM | POA: Insufficient documentation

## 2017-12-13 DIAGNOSIS — Z3A01 Less than 8 weeks gestation of pregnancy: Secondary | ICD-10-CM

## 2017-12-13 DIAGNOSIS — E038 Other specified hypothyroidism: Secondary | ICD-10-CM

## 2017-12-13 DIAGNOSIS — O09211 Supervision of pregnancy with history of pre-term labor, first trimester: Secondary | ICD-10-CM | POA: Diagnosis not present

## 2017-12-13 DIAGNOSIS — Z1389 Encounter for screening for other disorder: Secondary | ICD-10-CM

## 2017-12-13 LAB — POCT URINALYSIS DIPSTICK
Blood, UA: NEGATIVE
Glucose, UA: NEGATIVE
KETONES UA: NEGATIVE
Leukocytes, UA: NEGATIVE
Nitrite, UA: NEGATIVE
PROTEIN UA: NEGATIVE

## 2017-12-13 NOTE — Patient Instructions (Signed)
 First Trimester of Pregnancy The first trimester of pregnancy is from week 1 until the end of week 12 (months 1 through 3). A week after a sperm fertilizes an egg, the egg will implant on the wall of the uterus. This embryo will begin to develop into a baby. Genes from you and your partner are forming the baby. The female genes determine whether the baby is a boy or a girl. At 6-8 weeks, the eyes and face are formed, and the heartbeat can be seen on ultrasound. At the end of 12 weeks, all the baby's organs are formed.  Now that you are pregnant, you will want to do everything you can to have a healthy baby. Two of the most important things are to get good prenatal care and to follow your health care provider's instructions. Prenatal care is all the medical care you receive before the baby's birth. This care will help prevent, find, and treat any problems during the pregnancy and childbirth. BODY CHANGES Your body goes through many changes during pregnancy. The changes vary from woman to woman.   You may gain or lose a couple of pounds at first.  You may feel sick to your stomach (nauseous) and throw up (vomit). If the vomiting is uncontrollable, call your health care provider.  You may tire easily.  You may develop headaches that can be relieved by medicines approved by your health care provider.  You may urinate more often. Painful urination may mean you have a bladder infection.  You may develop heartburn as a result of your pregnancy.  You may develop constipation because certain hormones are causing the muscles that push waste through your intestines to slow down.  You may develop hemorrhoids or swollen, bulging veins (varicose veins).  Your breasts may begin to grow larger and become tender. Your nipples may stick out more, and the tissue that surrounds them (areola) may become darker.  Your gums may bleed and may be sensitive to brushing and flossing.  Dark spots or blotches  (chloasma, mask of pregnancy) may develop on your face. This will likely fade after the baby is born.  Your menstrual periods will stop.  You may have a loss of appetite.  You may develop cravings for certain kinds of food.  You may have changes in your emotions from day to day, such as being excited to be pregnant or being concerned that something may go wrong with the pregnancy and baby.  You may have more vivid and strange dreams.  You may have changes in your hair. These can include thickening of your hair, rapid growth, and changes in texture. Some women also have hair loss during or after pregnancy, or hair that feels dry or thin. Your hair will most likely return to normal after your baby is born. WHAT TO EXPECT AT YOUR PRENATAL VISITS During a routine prenatal visit:  You will be weighed to make sure you and the baby are growing normally.  Your blood pressure will be taken.  Your abdomen will be measured to track your baby's growth.  The fetal heartbeat will be listened to starting around week 10 or 12 of your pregnancy.  Test results from any previous visits will be discussed. Your health care provider may ask you:  How you are feeling.  If you are feeling the baby move.  If you have had any abnormal symptoms, such as leaking fluid, bleeding, severe headaches, or abdominal cramping.  If you have any questions. Other   tests that may be performed during your first trimester include:  Blood tests to find your blood type and to check for the presence of any previous infections. They will also be used to check for low iron levels (anemia) and Rh antibodies. Later in the pregnancy, blood tests for diabetes will be done along with other tests if problems develop.  Urine tests to check for infections, diabetes, or protein in the urine.  An ultrasound to confirm the proper growth and development of the baby.  An amniocentesis to check for possible genetic problems.  Fetal  screens for spina bifida and Down syndrome.  You may need other tests to make sure you and the baby are doing well. HOME CARE INSTRUCTIONS  Medicines  Follow your health care provider's instructions regarding medicine use. Specific medicines may be either safe or unsafe to take during pregnancy.  Take your prenatal vitamins as directed.  If you develop constipation, try taking a stool softener if your health care provider approves. Diet  Eat regular, well-balanced meals. Choose a variety of foods, such as meat or vegetable-based protein, fish, milk and low-fat dairy products, vegetables, fruits, and whole grain breads and cereals. Your health care provider will help you determine the amount of weight gain that is right for you.  Avoid raw meat and uncooked cheese. These carry germs that can cause birth defects in the baby.  Eating four or five small meals rather than three large meals a day may help relieve nausea and vomiting. If you start to feel nauseous, eating a few soda crackers can be helpful. Drinking liquids between meals instead of during meals also seems to help nausea and vomiting.  If you develop constipation, eat more high-fiber foods, such as fresh vegetables or fruit and whole grains. Drink enough fluids to keep your urine clear or pale yellow. Activity and Exercise  Exercise only as directed by your health care provider. Exercising will help you:  Control your weight.  Stay in shape.  Be prepared for labor and delivery.  Experiencing pain or cramping in the lower abdomen or low back is a good sign that you should stop exercising. Check with your health care provider before continuing normal exercises.  Try to avoid standing for long periods of time. Move your legs often if you must stand in one place for a long time.  Avoid heavy lifting.  Wear low-heeled shoes, and practice good posture.  You may continue to have sex unless your health care provider directs you  otherwise. Relief of Pain or Discomfort  Wear a good support bra for breast tenderness.   Take warm sitz baths to soothe any pain or discomfort caused by hemorrhoids. Use hemorrhoid cream if your health care provider approves.   Rest with your legs elevated if you have leg cramps or low back pain.  If you develop varicose veins in your legs, wear support hose. Elevate your feet for 15 minutes, 3-4 times a day. Limit salt in your diet. Prenatal Care  Schedule your prenatal visits by the twelfth week of pregnancy. They are usually scheduled monthly at first, then more often in the last 2 months before delivery.  Write down your questions. Take them to your prenatal visits.  Keep all your prenatal visits as directed by your health care provider. Safety  Wear your seat belt at all times when driving.  Make a list of emergency phone numbers, including numbers for family, friends, the hospital, and police and fire departments. General   Tips  Ask your health care provider for a referral to a local prenatal education class. Begin classes no later than at the beginning of month 6 of your pregnancy.  Ask for help if you have counseling or nutritional needs during pregnancy. Your health care provider can offer advice or refer you to specialists for help with various needs.  Do not use hot tubs, steam rooms, or saunas.  Do not douche or use tampons or scented sanitary pads.  Do not cross your legs for long periods of time.  Avoid cat litter boxes and soil used by cats. These carry germs that can cause birth defects in the baby and possibly loss of the fetus by miscarriage or stillbirth.  Avoid all smoking, herbs, alcohol, and medicines not prescribed by your health care provider. Chemicals in these affect the formation and growth of the baby.  Schedule a dentist appointment. At home, brush your teeth with a soft toothbrush and be gentle when you floss. SEEK MEDICAL CARE IF:   You have  dizziness.  You have mild pelvic cramps, pelvic pressure, or nagging pain in the abdominal area.  You have persistent nausea, vomiting, or diarrhea.  You have a bad smelling vaginal discharge.  You have pain with urination.  You notice increased swelling in your face, hands, legs, or ankles. SEEK IMMEDIATE MEDICAL CARE IF:   You have a fever.  You are leaking fluid from your vagina.  You have spotting or bleeding from your vagina.  You have severe abdominal cramping or pain.  You have rapid weight gain or loss.  You vomit blood or material that looks like coffee grounds.  You are exposed to German measles and have never had them.  You are exposed to fifth disease or chickenpox.  You develop a severe headache.  You have shortness of breath.  You have any kind of trauma, such as from a fall or a car accident. Document Released: 11/08/2001 Document Revised: 03/31/2014 Document Reviewed: 09/24/2013 Exit[employer] Patient Information 2015 [employer]. This information is not intended to replace advice given to you by your health care provider. Make sure you discuss any questions you have with your health care provider.   Nausea & Vomiting  Have saltine crackers or pretzels by your bed and eat a few bites before you raise your head out of bed in the morning  Eat small frequent meals throughout the day instead of large meals  Drink plenty of fluids throughout the day to stay hydrated, just don't drink a lot of fluids with your meals.  This can make your stomach fill up faster making you feel sick  Do not brush your teeth right after you eat  Products with real ginger are good for nausea, like ginger ale and ginger hard candy Make sure it says made with real ginger!  Sucking on sour candy like lemon heads is also good for nausea  If your prenatal vitamins make you nauseated, take them at night so you will sleep through the nausea  Sea Bands  If you feel like you need  medicine for the nausea & vomiting please let us know  If you are unable to keep any fluids or food down please let us know   Constipation  Drink plenty of fluid, preferably water, throughout the day  Eat foods high in fiber such as fruits, vegetables, and grains  Exercise, such as walking, is a good way to keep your bowels regular  Drink warm fluids, especially warm   prune juice, or decaf coffee  Eat a 1/2 cup of real oatmeal (not instant), 1/2 cup applesauce, and 1/2-1 cup warm prune juice every day  If needed, you may take Colace (docusate sodium) stool softener once or twice a day to help keep the stool soft. If you are pregnant, wait until you are out of your first trimester (12-14 weeks of pregnancy)  If you still are having problems with constipation, you may take Miralax once daily as needed to help keep your bowels regular.  If you are pregnant, wait until you are out of your first trimester (12-14 weeks of pregnancy)  Safe Medications in Pregnancy   Acne: Benzoyl Peroxide Salicylic Acid  Backache/Headache: Tylenol: 2 regular strength every 4 hours OR              2 Extra strength every 6 hours  Colds/Coughs/Allergies: Benadryl (alcohol free) 25 mg every 6 hours as needed Breath right strips Claritin Cepacol throat lozenges Chloraseptic throat spray Cold-Eeze- up to three times per day Cough drops, alcohol free Flonase (by prescription only) Guaifenesin Mucinex Robitussin DM (plain only, alcohol free) Saline nasal spray/drops Sudafed (pseudoephedrine) & Actifed ** use only after [redacted] weeks gestation and if you do not have high blood pressure Tylenol Vicks Vaporub Zinc lozenges Zyrtec   Constipation: Colace Ducolax suppositories Fleet enema Glycerin suppositories Metamucil Milk of magnesia Miralax Senokot Smooth move tea  Diarrhea: Kaopectate Imodium A-D  *NO pepto Bismol  Hemorrhoids: Anusol Anusol HC Preparation  H Tucks  Indigestion: Tums Maalox Mylanta Zantac  Pepcid  Insomnia: Benadryl (alcohol free) 25mg every 6 hours as needed Tylenol PM Unisom, no Gelcaps  Leg Cramps: Tums MagGel  Nausea/Vomiting:  Bonine Dramamine Emetrol Ginger extract Sea bands Meclizine  Nausea medication to take during pregnancy:  Unisom (doxylamine succinate 25 mg tablets) Take one tablet daily at bedtime. If symptoms are not adequately controlled, the dose can be increased to a maximum recommended dose of two tablets daily (1/2 tablet in the morning, 1/2 tablet mid-afternoon and one at bedtime). Vitamin B6 100mg tablets. Take one tablet twice a day (up to 200 mg per day).  Skin Rashes: Aveeno products Benadryl cream or 25mg every 6 hours as needed Calamine Lotion 1% cortisone cream  Yeast infection: Gyne-lotrimin 7 Monistat 7   **If taking multiple medications, please check labels to avoid duplicating the same active ingredients **take medication as directed on the label ** Do not exceed 4000 mg of tylenol in 24 hours **Do not take medications that contain aspirin or ibuprofen      

## 2017-12-13 NOTE — Progress Notes (Signed)
Subjective:    Tara Mcmahon is a [id number] 3650w5d being seen today for her first obstetrical visit.  Her obstetrical history is significant for PTL w/rescue cerclage at 26 weeks. 2 days later she had PROMand delivered, ? Abruption. 2nd pregnancy she had a prophylactic cerlage, was on 17p, added vag pg . Wound up w/IOL at 39 weeks for mild GHTN, anxious about decreased FM for several days. Pt is moving to [place] to get away from her parents who are in a toxic divorce on March 8.  Wants another cerclage.   Patient reports no complaints.  Vitals:   12/13/17 1109  BP: 140/82  Pulse: (!) 105  Weight: 201 lb (91.2 kg)    HISTORY: OB History  Gravida Para Term Preterm AB Living  4 2 1 1 1 2   SAB TAB Ectopic Multiple Live Births  1     0 2    # Outcome Date GA Lbr Len/2nd Weight Sex Delivery Anes PTL Lv  4 Current           3 Term 08/30/16 6227w3d 07:05 / 03:13 5 lb 15.1 oz (2.695 kg) F Vag-Spont EPI Y LIV     Complications: History of cervical cerclage  2 Preterm 09/19/12 3727w0d  1 lb 13 oz (0.822 kg) F Vag-Spont None Y LIV     Complications: Cervical incompetence,History of preterm premature rupture of membranes (PPROM)  1 SAB 2013             Past Medical History:  Diagnosis Date  . Anxiety   . Cervical incompetence during pregnancy   . Depression   . Hypothyroidism   . PCOS (polycystic ovarian syndrome)    Past Surgical History:  Procedure Laterality Date  . CERVICAL CERCLAGE    . CERVICAL CERCLAGE N/A 03/09/2016   [facility];  Surgeon: Lazaro ArmsLuther H Eure, MD;  Location: [facility];  Service: Gynecology;  Laterality: N/A;  . labial reduction     Family History  Problem Relation Age of Onset  . Arthritis Mother   . Lupus Mother   . Autoimmune disease Mother   . Crohn's disease Father   . Cancer Maternal Grandmother        breast  . Heart disease Maternal Grandfather   . Heart disease Paternal Grandfather   . Hypertension Paternal Grandfather   . Lupus  Maternal Aunt      Exam                                      System:     Skin: normal coloration and turgor, no rashes    Neurologic: oriented, normal, normal mood   Extremities: normal strength, tone, and muscle mass   HEENT PERRLA   Mouth/Teeth mucous membranes moist, normal dentition   Neck supple and no masses   Cardiovascular: regular rate and rhythm   Respiratory:  appears well, vitals normal, no respiratory distress, acyanotic   Abdomen: soft, non-tender;  FHR: 160 US        The nature of [facility] with multiple MDs and other Advanced Practice Providers was explained to patient; also emphasized that residents, students are part of our team.  Assessment:    Pregnancy: [id number] Patient Active Problem List   Diagnosis Date Noted  . Supervision of normal pregnancy 12/13/2017  . Abdominal cramps 11/27/2017  . Grief reaction 11/27/2017  .  History of cervical incompetence in pregnancy, currently pregnant 11/23/2017  . History of preterm delivery, currently pregnant 11/23/2017  . Pregnancy examination or test, positive result 11/23/2017  . History of PCOS 06/23/2017  . Dysmenorrhea 06/23/2017  . Menorrhagia with irregular cycle 06/23/2017  . Encounter to determine fetal viability of pregnancy 06/23/2017  . Encounter for surveillance of contraceptive pills 06/23/2017  . Fever 09/11/2016  . Breast pain 09/11/2016  . Decreased fetal movement affecting management of mother, antepartum 08/29/2016  . Gestational hypertension 08/29/2016  . Subclinical hypothyroidism 02/04/2016  . History of suicidal ideation 02/03/2016  . Pregnancy 01/27/2016  . PCOS (polycystic ovarian syndrome) 01/27/2016  . Depression with anxiety 01/27/2016  . Family history of systemic lupus erythematosus (SLE) in mother 01/27/2016        Plan:     Initial labs drawn. Continue prenatal vitamins  ASA 12 weeks Problem list reviewed and updated   Reviewed n/v relief measures and warning s/s to report  Reviewed recommended weight gain based on pre-gravid BMI  Encouraged well-balanced diet Genetic Screening discussed Integrated Screen: requested.  Ultrasound discussed; fetal survey: requested.  Return in about 3 weeks (around 01/03/2018) for LROB w/LHE (discuss cerclage) and 2/22 for NT/IT only.  CRESENZO-DISHMAN,Ronny Korff 12/13/2017

## 2017-12-14 ENCOUNTER — Encounter: Payer: Self-pay | Admitting: Advanced Practice Midwife

## 2017-12-14 DIAGNOSIS — O9989 Other specified diseases and conditions complicating pregnancy, childbirth and the puerperium: Secondary | ICD-10-CM

## 2017-12-14 DIAGNOSIS — Z283 Underimmunization status: Secondary | ICD-10-CM | POA: Insufficient documentation

## 2017-12-14 DIAGNOSIS — Z2839 Other underimmunization status: Secondary | ICD-10-CM | POA: Insufficient documentation

## 2017-12-14 DIAGNOSIS — O99891 Other specified diseases and conditions complicating pregnancy: Secondary | ICD-10-CM | POA: Insufficient documentation

## 2017-12-14 LAB — URINALYSIS, ROUTINE W REFLEX MICROSCOPIC
Bilirubin, UA: NEGATIVE
Glucose, UA: NEGATIVE
Ketones, UA: NEGATIVE
Leukocytes, UA: NEGATIVE
Nitrite, UA: NEGATIVE
PH UA: 6 (ref 5.0–7.5)
PROTEIN UA: NEGATIVE
RBC, UA: NEGATIVE
Specific Gravity, UA: 1.024 (ref 1.005–1.030)
UUROB: 0.2 mg/dL (ref 0.2–1.0)

## 2017-12-14 LAB — CBC
HEMOGLOBIN: 12.9 g/dL (ref 11.1–15.9)
Hematocrit: 39.2 % (ref 34.0–46.6)
MCH: 28.5 pg (ref 26.6–33.0)
MCHC: 32.9 g/dL (ref 31.5–35.7)
MCV: 87 fL (ref 79–97)
Platelets: 394 10*3/uL — ABNORMAL HIGH (ref 150–379)
RBC: 4.52 x10E6/uL (ref 3.77–5.28)
RDW: 13.6 % (ref 12.3–15.4)
WBC: 11.9 10*3/uL — ABNORMAL HIGH (ref 3.4–10.8)

## 2017-12-14 LAB — PMP SCREEN PROFILE (10S), URINE
AMPHETAMINE SCREEN URINE: NEGATIVE ng/mL
BARBITURATE SCREEN URINE: NEGATIVE ng/mL
BENZODIAZEPINE SCREEN, URINE: NEGATIVE ng/mL
CANNABINOIDS UR QL SCN: NEGATIVE ng/mL
Cocaine (Metab) Scrn, Ur: NEGATIVE ng/mL
Creatinine(Crt), U: 191.6 mg/dL (ref 20.0–300.0)
METHADONE SCREEN, URINE: NEGATIVE ng/mL
OXYCODONE+OXYMORPHONE UR QL SCN: NEGATIVE ng/mL
Opiate Scrn, Ur: NEGATIVE ng/mL
PH UR, DRUG SCRN: 5.9 (ref 4.5–8.9)
PHENCYCLIDINE QUANTITATIVE URINE: NEGATIVE ng/mL
Propoxyphene Scrn, Ur: NEGATIVE ng/mL

## 2017-12-14 LAB — RUBELLA SCREEN

## 2017-12-14 LAB — ANTIBODY SCREEN: Antibody Screen: NEGATIVE

## 2017-12-14 LAB — HIV ANTIBODY (ROUTINE TESTING W REFLEX): HIV Screen 4th Generation wRfx: NONREACTIVE

## 2017-12-14 LAB — MED LIST OPTION NOT SELECTED

## 2017-12-14 LAB — HEPATITIS B SURFACE ANTIGEN: HEP B S AG: NEGATIVE

## 2017-12-14 LAB — RPR: RPR: NONREACTIVE

## 2017-12-15 LAB — URINE CULTURE: ORGANISM ID, BACTERIA: NO GROWTH

## 2017-12-15 LAB — GC/CHLAMYDIA PROBE AMP
CHLAMYDIA, DNA PROBE: NEGATIVE
NEISSERIA GONORRHOEAE BY PCR: NEGATIVE

## 2017-12-18 ENCOUNTER — Telehealth: Payer: Self-pay | Admitting: *Deleted

## 2017-12-18 NOTE — Telephone Encounter (Signed)
Patient called in a panic stating her labs came to her and she noticed her platelets were high and worried that is a problem.  Informed patient the platelets were not too high for her to be concerned about at this time. Verbalized understanding.

## 2017-12-26 ENCOUNTER — Telehealth: Payer: Self-pay | Admitting: Advanced Practice Midwife

## 2017-12-26 NOTE — Telephone Encounter (Signed)
Patient states she fell to her hands and knees while playing with her child. She did not hit her belly and had some light cramping earlier but none at this time nor any bleeding. Informed patient that at this point there is nothing that could be done but to monitor for cramping or bleeding. Pt verbalized understanding.

## 2017-12-28 LAB — SPECIMEN STATUS REPORT

## 2018-01-01 ENCOUNTER — Ambulatory Visit (INDEPENDENT_AMBULATORY_CARE_PROVIDER_SITE_OTHER): Payer: Medicaid Other | Admitting: Women's Health

## 2018-01-01 ENCOUNTER — Other Ambulatory Visit: Payer: Self-pay

## 2018-01-01 ENCOUNTER — Encounter: Payer: Self-pay | Admitting: Women's Health

## 2018-01-01 ENCOUNTER — Telehealth: Payer: Self-pay | Admitting: Obstetrics & Gynecology

## 2018-01-01 VITALS — BP 124/66 | HR 115 | Wt 203.0 lb

## 2018-01-01 DIAGNOSIS — Z3A10 10 weeks gestation of pregnancy: Secondary | ICD-10-CM | POA: Diagnosis not present

## 2018-01-01 DIAGNOSIS — O26891 Other specified pregnancy related conditions, first trimester: Secondary | ICD-10-CM | POA: Diagnosis not present

## 2018-01-01 DIAGNOSIS — Z1389 Encounter for screening for other disorder: Secondary | ICD-10-CM

## 2018-01-01 DIAGNOSIS — O26851 Spotting complicating pregnancy, first trimester: Secondary | ICD-10-CM

## 2018-01-01 DIAGNOSIS — R109 Unspecified abdominal pain: Secondary | ICD-10-CM | POA: Diagnosis not present

## 2018-01-01 DIAGNOSIS — Z331 Pregnant state, incidental: Secondary | ICD-10-CM

## 2018-01-01 DIAGNOSIS — O26899 Other specified pregnancy related conditions, unspecified trimester: Secondary | ICD-10-CM

## 2018-01-01 LAB — POCT URINALYSIS DIPSTICK
Blood, UA: NEGATIVE
Glucose, UA: NEGATIVE
KETONES UA: NEGATIVE
LEUKOCYTES UA: NEGATIVE
NITRITE UA: NEGATIVE
PROTEIN UA: NEGATIVE

## 2018-01-01 LAB — POCT WET PREP (WET MOUNT)
CLUE CELLS WET PREP WHIFF POC: NEGATIVE
TRICHOMONAS WET PREP HPF POC: ABSENT

## 2018-01-01 NOTE — Progress Notes (Signed)
Work-in LOW-RISK PREGNANCY VISIT Patient name: Tara Mcmahon MRN [id number]  Date of birth: December 23, 1995 Chief Complaint:   Vaginal Bleeding (with abdominal cramping)  History of Present Illness:   Tara Mcmahon is a 22 y.o. [id number] female at [redacted]w[redacted]d with an Estimated Date of Delivery: 07/27/18 being seen today for ongoing management of a low-risk pregnancy.  Today she is being seen as a work-in for report of vaginal bleeding/abd cramping. No recent sex, but had 'external stimulation' with orgasm yesterday am, then saw 1 spot of bright red blood Sun pm. Cramping began this am. No further vag bleeding. H/o incompetent cx, ptb x 1.  . Vag. Bleeding: Scant.  Movement: Absent. denies leaking of fluid. Review of Systems:   Pertinent items are noted in HPI Denies abnormal vaginal discharge w/ itching/odor/irritation, headaches, visual changes, shortness of breath, chest pain, abdominal pain, severe nausea/vomiting, or problems with urination or bowel movements unless otherwise stated above. Pertinent History Reviewed:  Reviewed past medical,surgical, social, obstetrical and family history.  Reviewed problem list, medications and allergies. Physical Assessment:   Vitals:   01/01/18 1553  BP: 124/66  Pulse: (!) 115  Weight: 203 lb (92.1 kg)  Body mass index is 41 kg/m.        Physical Examination:   General appearance: Well appearing, and in no distress  Mental status: Alert, oriented to person, place, and time  Skin: Warm & dry  Cardiovascular: Normal heart rate noted  Respiratory: Normal respiratory effort, no distress  Abdomen: Soft, gravid, nontender  Pelvic: cx visually long/closed, no blood noted, small amt white nonodorous d/c         Extremities: Edema: None  Fetal Status: Fetal Heart Rate (bpm): 173   Movement: Absent    Results for orders placed or performed in visit on 01/01/18 (from the past 24 hour(s))  POCT urinalysis dipstick   Collection Time: 01/01/18  3:54 PM  Result  Value Ref Range   Color, UA     Clarity, UA     Glucose, UA neg    Bilirubin, UA     Ketones, UA neg    Spec Grav, UA  1.010 - 1.025   Blood, UA neg    pH, UA  5.0 - 8.0   Protein, UA neg    Urobilinogen, UA  0.2 or 1.0 E.U./dL   Nitrite, UA neg    Leukocytes, UA Negative Negative   Appearance     Odor    POCT Wet Prep [facility] [place])   Collection Time: 01/01/18  4:19 PM  Result Value Ref Range   Source Wet Prep POC vaginal    WBC, Wet Prep HPF POC none    Bacteria Wet Prep HPF POC Few Few   BACTERIA WET PREP MORPHOLOGY POC     Clue Cells Wet Prep HPF POC None None   Clue Cells Wet Prep Whiff POC Negative Whiff    Yeast Wet Prep HPF POC None    KOH Wet Prep POC     Trichomonas Wet Prep HPF POC Absent Absent    Assessment & Plan:  1) Low-risk pregnancy [id number] at [redacted]w[redacted]d with an Estimated Date of Delivery: 07/27/18   2) Spotting first trimester w/ cramping, +FHT, spotting has resolved, likely d/t orgasm, no s/s vaginal infection. Pelvic rest for now   Meds: No orders of the defined types were placed in this encounter.  Labs/procedures today: spec exam, wet prep  Plan:  Continue routine obstetrical care   Reviewed: Preterm labor  symptoms and general obstetric precautions including but not limited to vaginal bleeding, contractions, leaking of fluid and fetal movement were reviewed in detail with the patient.  All questions were answered  Follow-up: Return for As scheduled Thurs for visit w/ LHE. to discuss cerclage  Orders Placed This Encounter  Procedures  . POCT urinalysis dipstick  . POCT [facility] Financial[facility]et Prep ([address] [place])   Marge DuncansBooker, Aldo Sondgeroth Randall CNM, [facility]NP-BC 01/01/2018 4:20 PM

## 2018-01-01 NOTE — Patient Instructions (Signed)
Tara HarperAshleigh Mcmahon, I greatly value your feedback.  If you receive a survey following your visit with us today, we appreciate you taking the time to fill it out.  Thanks, Joellyn HaffKim Quatavious Rossa, CNM, WHNP-BC   First Trimester of Pregnancy The first trimester of pregnancy is from week 1 until the end of week 13 (months 1 through 3). A week after a sperm fertilizes an egg, the egg will implant on the wall of the uterus. This embryo will begin to develop into a baby. Genes from you and your partner will form the baby. The female genes will determine whether the baby will be a boy or a girl. At 6-8 weeks, the eyes and face will be formed, and the heartbeat can be seen on ultrasound. At the end of 12 weeks, all the baby's organs will be formed. Now that you are pregnant, you will want to do everything you can to have a healthy baby. Two of the most important things are to get good prenatal care and to follow your health care provider's instructions. Prenatal care is all the medical care you receive before the baby's birth. This care will help prevent, find, and treat any problems during the pregnancy and childbirth. Body changes during your first trimester Your body goes through many changes during pregnancy. The changes vary from woman to woman.  You may gain or lose a couple of pounds at first.  You may feel sick to your stomach (nauseous) and you may throw up (vomit). If the vomiting is uncontrollable, call your health care provider.  You may tire easily.  You may develop headaches that can be relieved by medicines. All medicines should be approved by your health care provider.  You may urinate more often. Painful urination may mean you have a bladder infection.  You may develop heartburn as a result of your pregnancy.  You may develop constipation because certain hormones are causing the muscles that push stool through your intestines to slow down.  You may develop hemorrhoids or swollen veins (varicose  veins).  Your breasts may begin to grow larger and become tender. Your nipples may stick out more, and the tissue that surrounds them (areola) may become darker.  Your gums may bleed and may be sensitive to brushing and flossing.  Dark spots or blotches (chloasma, mask of pregnancy) may develop on your face. This will likely fade after the baby is born.  Your menstrual periods will stop.  You may have a loss of appetite.  You may develop cravings for certain kinds of food.  You may have changes in your emotions from day to day, such as being excited to be pregnant or being concerned that something may go wrong with the pregnancy and baby.  You may have more vivid and strange dreams.  You may have changes in your hair. These can include thickening of your hair, rapid growth, and changes in texture. Some women also have hair loss during or after pregnancy, or hair that feels dry or thin. Your hair will most likely return to normal after your baby is born.  What to expect at prenatal visits During a routine prenatal visit:  You will be weighed to make sure you and the baby are growing normally.  Your blood pressure will be taken.  Your abdomen will be measured to track your baby's growth.  The fetal heartbeat will be listened to between weeks 10 and 14 of your pregnancy.  Test results from any previous visits will be  discussed.  Your health care provider may ask you:  How you are feeling.  If you are feeling the baby move.  If you have had any abnormal symptoms, such as leaking fluid, bleeding, severe headaches, or abdominal cramping.  If you are using any tobacco products, including cigarettes, chewing tobacco, and electronic cigarettes.  If you have any questions.  Other tests that may be performed during your first trimester include:  Blood tests to find your blood type and to check for the presence of any previous infections. The tests will also be used to check for  low iron levels (anemia) and protein on red blood cells (Rh antibodies). Depending on your risk factors, or if you previously had diabetes during pregnancy, you may have tests to check for high blood sugar that affects pregnant women (gestational diabetes).  Urine tests to check for infections, diabetes, or protein in the urine.  An ultrasound to confirm the proper growth and development of the baby.  Fetal screens for spinal cord problems (spina bifida) and Down syndrome.  HIV (human immunodeficiency virus) testing. Routine prenatal testing includes screening for HIV, unless you choose not to have this test.  You may need other tests to make sure you and the baby are doing well.  Follow these instructions at home: Medicines  Follow your health care provider's instructions regarding medicine use. Specific medicines may be either safe or unsafe to take during pregnancy.  Take a prenatal vitamin that contains at least 600 micrograms (mcg) of folic acid.  If you develop constipation, try taking a stool softener if your health care provider approves. Eating and drinking  Eat a balanced diet that includes fresh fruits and vegetables, whole grains, good sources of protein such as meat, eggs, or tofu, and low-fat dairy. Your health care provider will help you determine the amount of weight gain that is right for you.  Avoid raw meat and uncooked cheese. These carry germs that can cause birth defects in the baby.  Eating four or five small meals rather than three large meals a day may help relieve nausea and vomiting. If you start to feel nauseous, eating a few soda crackers can be helpful. Drinking liquids between meals, instead of during meals, also seems to help ease nausea and vomiting.  Limit foods that are high in fat and processed sugars, such as fried and sweet foods.  To prevent constipation: ? Eat foods that are high in fiber, such as fresh fruits and vegetables, whole grains, and  beans. ? Drink enough fluid to keep your urine clear or pale yellow. Activity  Exercise only as directed by your health care provider. Most women can continue their usual exercise routine during pregnancy. Try to exercise for 30 minutes at least 5 days a week. Exercising will help you: ? Control your weight. ? Stay in shape. ? Be prepared for labor and delivery.  Experiencing pain or cramping in the lower abdomen or lower back is a good sign that you should stop exercising. Check with your health care provider before continuing with normal exercises.  Try to avoid standing for long periods of time. Move your legs often if you must stand in one place for a long time.  Avoid heavy lifting.  Wear low-heeled shoes and practice good posture.  You may continue to have sex unless your health care provider tells you not to. Relieving pain and discomfort  Wear a good support bra to relieve breast tenderness.  Take warm sitz baths  to soothe any pain or discomfort caused by hemorrhoids. Use hemorrhoid cream if your health care provider approves.  Rest with your legs elevated if you have leg cramps or low back pain.  If you develop varicose veins in your legs, wear support hose. Elevate your feet for 15 minutes, 3-4 times a day. Limit salt in your diet. Prenatal care  Schedule your prenatal visits by the twelfth week of pregnancy. They are usually scheduled monthly at first, then more often in the last 2 months before delivery.  Write down your questions. Take them to your prenatal visits.  Keep all your prenatal visits as told by your health care provider. This is important. Safety  Wear your seat belt at all times when driving.  Make a list of emergency phone numbers, including numbers for family, friends, the hospital, and police and fire departments. General instructions  Ask your health care provider for a referral to a local prenatal education class. Begin classes no later than the  beginning of month 6 of your pregnancy.  Ask for help if you have counseling or nutritional needs during pregnancy. Your health care provider can offer advice or refer you to specialists for help with various needs.  Do not use hot tubs, steam rooms, or saunas.  Do not douche or use tampons or scented sanitary pads.  Do not cross your legs for long periods of time.  Avoid cat litter boxes and soil used by cats. These carry germs that can cause birth defects in the baby and possibly loss of the fetus by miscarriage or stillbirth.  Avoid all smoking, herbs, alcohol, and medicines not prescribed by your health care provider. Chemicals in these products affect the formation and growth of the baby.  Do not use any products that contain nicotine or tobacco, such as cigarettes and e-cigarettes. If you need help quitting, ask your health care provider. You may receive counseling support and other resources to help you quit.  Schedule a dentist appointment. At home, brush your teeth with a soft toothbrush and be gentle when you floss. Contact a health care provider if:  You have dizziness.  You have mild pelvic cramps, pelvic pressure, or nagging pain in the abdominal area.  You have persistent nausea, vomiting, or diarrhea.  You have a bad smelling vaginal discharge.  You have pain when you urinate.  You notice increased swelling in your face, hands, legs, or ankles.  You are exposed to fifth disease or chickenpox.  You are exposed to [place] measles (rubella) and have never had it. Get help right away if:  You have a fever.  You are leaking fluid from your vagina.  You have spotting or bleeding from your vagina.  You have severe abdominal cramping or pain.  You have rapid weight gain or loss.  You vomit blood or material that looks like coffee grounds.  You develop a severe headache.  You have shortness of breath.  You have any kind of trauma, such as from a fall or a car  accident. Summary  The first trimester of pregnancy is from week 1 until the end of week 13 (months 1 through 3).  Your body goes through many changes during pregnancy. The changes vary from woman to woman.  You will have routine prenatal visits. During those visits, your health care provider will examine you, discuss any test results you may have, and talk with you about how you are feeling. This information is not intended to replace advice  given to you by your health care provider. Make sure you discuss any questions you have with your health care provider. Document Released: 11/08/2001 Document Revised: 10/26/2016 Document Reviewed: 10/26/2016 Elsevier Interactive Patient Education  2017 [employer].

## 2018-01-01 NOTE — Telephone Encounter (Signed)
Patient states she had some bright red spotting last night and is not having spotting now just severe cramping. Has pushed fluids with no relief. Very anxious considering her history. Will w/i this afternoon with Selena BattenKim.

## 2018-01-01 NOTE — Addendum Note (Signed)
Addended by: Cheral MarkerBOOKER, Ajani Schnieders R on: 01/01/2018 04:21 PM   Modules accepted: Level of Service

## 2018-01-04 ENCOUNTER — Encounter: Payer: Self-pay | Admitting: Obstetrics & Gynecology

## 2018-01-04 ENCOUNTER — Ambulatory Visit (INDEPENDENT_AMBULATORY_CARE_PROVIDER_SITE_OTHER): Payer: Medicaid Other | Admitting: Obstetrics & Gynecology

## 2018-01-04 ENCOUNTER — Other Ambulatory Visit: Payer: Self-pay

## 2018-01-04 VITALS — BP 120/64 | HR 118 | Wt 201.0 lb

## 2018-01-04 DIAGNOSIS — Z1389 Encounter for screening for other disorder: Secondary | ICD-10-CM

## 2018-01-04 DIAGNOSIS — Z3A10 10 weeks gestation of pregnancy: Secondary | ICD-10-CM

## 2018-01-04 DIAGNOSIS — Z3481 Encounter for supervision of other normal pregnancy, first trimester: Secondary | ICD-10-CM

## 2018-01-04 DIAGNOSIS — O09299 Supervision of pregnancy with other poor reproductive or obstetric history, unspecified trimester: Secondary | ICD-10-CM

## 2018-01-04 DIAGNOSIS — Z331 Pregnant state, incidental: Secondary | ICD-10-CM | POA: Diagnosis not present

## 2018-01-04 LAB — POCT URINALYSIS DIPSTICK
Blood, UA: NEGATIVE
GLUCOSE UA: NEGATIVE
KETONES UA: NEGATIVE
Leukocytes, UA: NEGATIVE
Nitrite, UA: NEGATIVE
Protein, UA: NEGATIVE

## 2018-01-04 NOTE — Progress Notes (Signed)
[id number] Estimated Date of Delivery: 07/27/18  Blood pressure 120/64, pulse (!) 118, weight 201 lb (91.2 kg), last menstrual period 10/12/2017, not currently breastfeeding.   BP weight and urine results all reviewed and noted.  Please refer to the obstetrical flow sheet for the fundal height and fetal heart rate documentation:  Patient reports good fetal movement, denies any bleeding and no rupture of membranes symptoms or regular contractions. Patient is without complaints. All questions were answered.  Orders Placed This Encounter  Procedures  . POCT urinalysis dipstick    Plan:  Continued routine obstetrical care,  Hx of cervical incompetence, will place 01/31/2018, [place] [facility]  Return for keep appt, see Dr Despina HiddenEure that day after sonogram.

## 2018-01-05 LAB — THYROID PANEL WITH TSH
FREE THYROXINE INDEX: 1.9 (ref 1.2–4.9)
T3 Uptake Ratio: 22 % — ABNORMAL LOW (ref 24–39)
T4, Total: 8.7 ug/dL (ref 4.5–12.0)
TSH: 1.96 u[IU]/mL (ref 0.450–4.500)

## 2018-01-05 LAB — SPECIMEN STATUS REPORT

## 2018-01-12 ENCOUNTER — Telehealth: Payer: Self-pay | Admitting: Advanced Practice Midwife

## 2018-01-12 NOTE — Telephone Encounter (Signed)
Patient states she was told to start ASA at 12 weeks but doesn't know how much. Spoke to Dr Despina HiddenEure who advised 162mg  daily. INformed patient and verbalized understanding.

## 2018-01-19 ENCOUNTER — Ambulatory Visit (INDEPENDENT_AMBULATORY_CARE_PROVIDER_SITE_OTHER): Payer: Medicaid Other

## 2018-01-19 ENCOUNTER — Ambulatory Visit (INDEPENDENT_AMBULATORY_CARE_PROVIDER_SITE_OTHER): Payer: Medicaid Other | Admitting: Obstetrics & Gynecology

## 2018-01-19 ENCOUNTER — Encounter: Payer: Self-pay | Admitting: Obstetrics & Gynecology

## 2018-01-19 VITALS — HR 97 | Wt 199.0 lb

## 2018-01-19 DIAGNOSIS — O09291 Supervision of pregnancy with other poor reproductive or obstetric history, first trimester: Secondary | ICD-10-CM | POA: Diagnosis not present

## 2018-01-19 DIAGNOSIS — Z3A13 13 weeks gestation of pregnancy: Secondary | ICD-10-CM

## 2018-01-19 DIAGNOSIS — Z1389 Encounter for screening for other disorder: Secondary | ICD-10-CM

## 2018-01-19 DIAGNOSIS — Z331 Pregnant state, incidental: Secondary | ICD-10-CM

## 2018-01-19 DIAGNOSIS — O09299 Supervision of pregnancy with other poor reproductive or obstetric history, unspecified trimester: Secondary | ICD-10-CM

## 2018-01-19 DIAGNOSIS — Z3682 Encounter for antenatal screening for nuchal translucency: Secondary | ICD-10-CM | POA: Diagnosis not present

## 2018-01-19 DIAGNOSIS — O09211 Supervision of pregnancy with history of pre-term labor, first trimester: Secondary | ICD-10-CM

## 2018-01-19 DIAGNOSIS — Z283 Underimmunization status: Secondary | ICD-10-CM

## 2018-01-19 DIAGNOSIS — Z3481 Encounter for supervision of other normal pregnancy, first trimester: Secondary | ICD-10-CM

## 2018-01-19 DIAGNOSIS — O131 Gestational [pregnancy-induced] hypertension without significant proteinuria, first trimester: Secondary | ICD-10-CM

## 2018-01-19 DIAGNOSIS — O9989 Other specified diseases and conditions complicating pregnancy, childbirth and the puerperium: Secondary | ICD-10-CM

## 2018-01-19 DIAGNOSIS — Z3402 Encounter for supervision of normal first pregnancy, second trimester: Secondary | ICD-10-CM

## 2018-01-19 DIAGNOSIS — O09899 Supervision of other high risk pregnancies, unspecified trimester: Secondary | ICD-10-CM

## 2018-01-19 LAB — POCT URINALYSIS DIPSTICK
Blood, UA: NEGATIVE
GLUCOSE UA: NEGATIVE
Ketones, UA: NEGATIVE
Leukocytes, UA: NEGATIVE
Nitrite, UA: NEGATIVE

## 2018-01-19 NOTE — Progress Notes (Signed)
US 13 wks,measurements c/w dates,normal ovaries bilat,fhr 141 bpm,crl 70.89 mm,NB present,NT 2.1 mm

## 2018-01-19 NOTE — Progress Notes (Signed)
[id number] [redacted]w[redacted]d Estimated Date of Delivery: 07/27/18  Pulse 97, weight 199 lb (90.3 kg), last menstrual period 10/12/2017, not currently breastfeeding.   BP weight and urine results all reviewed and noted.  Please refer to the obstetrical flow sheet for the fundal height and fetal heart rate documentation:  Patient reports good fetal movement, denies any bleeding and no rupture of membranes symptoms or regular contractions. Patient is without complaints. All questions were answered.  Orders Placed This Encounter  Procedures  . Integrated 1  . POCT urinalysis dipstick    Plan:  Continued routine obstetrical care, incompetent cervix cercalge 01/31/2018  Return if symptoms worsen or fail to improve, [place] [facility] [place] [facility] [place]

## 2018-01-22 ENCOUNTER — Encounter: Payer: Self-pay | Admitting: Obstetrics & Gynecology

## 2018-01-23 LAB — INTEGRATED 1
Crown Rump Length: 70.9 mm
Gest. Age on Collection Date: 13.1 weeks
MATERNAL AGE AT EDD: 24.1 a
NUCHAL TRANSLUCENCY (NT): 2.1 mm
Number of Fetuses: 1
PAPP-A VALUE: 690.6 ng/mL
Weight: 199 [lb_av]

## 2018-01-23 NOTE — Patient Instructions (Signed)
Tara Mcmahon  01/23/2018     @PREFPERIOPPHARMACY @   Your procedure is scheduled on  01/31/2018 .  Report to [facility] at  1050   A.M.  Call this number if you have problems the morning of surgery:  [phone]   Remember:  Do not eat food or drink liquids after midnight.  Take these medicines the morning of surgery with A SIP OF WATER  None   Do not wear jewelry, make-up or nail polish.  Do not wear lotions, powders, or perfumes, or deodorant.  Do not shave 48 hours prior to surgery.  Men may shave face and neck.  Do not bring valuables to the hospital.  [facility] is not responsible for any belongings or valuables.  Contacts, dentures or bridgework may not be worn into surgery.  Leave your suitcase in the car.  After surgery it may be brought to your room.  For patients admitted to the hospital, discharge time will be determined by your treatment team.  Patients discharged the day of surgery will not be allowed to drive home.   Name and phone number of your driver:   Family Special instructions:  None  Please read over the following fact sheets that you were given. Anesthesia Post-op Instructions and Care and Recovery After Surgery       Cervical Cerclage Cervical cerclage is a surgical procedure to correct a cervix that opens up and thins out before pregnancy is at term (cervical insufficiency, also called incompetent cervix). This condition can cause labor to start early (prematurely). This procedure involves using stitches to sew the cervix shut during pregnancy. Your surgeon may use ultrasound equipment to help guide the procedure and monitor your baby. Ultrasound equipment uses sound waves to take images of your cervix and uterus. Your surgeon will assess these images on a monitor in the operating room. Tell a health care provider about:  Any allergies you have, especially any allergies related to prescribed medicine, stitches, or anesthetic  medicines.  All medicines you are taking, including vitamins, herbs, eye drops, creams, and over-the-counter medicines. Bring a list of all of your medicines to your appointment.  Your medical history, including prior labor deliveries.  Any problems you or family members have had with anesthetic medicines.  Any blood disorders you have.  Any surgeries you have had, including prior cervical stitching.  Any medical conditions you have.  Whether you are pregnant or may be pregnant. What are the risks? Generally, this is a safe procedure. However, problems may occur, including:  Infection, such as infection of the cervix or amniotic sac.  Vaginal bleeding.  Allergic reactions to medicines.  Damage to other structures or organs, such as tearing (rupture) of membranes or cervical laceration.  Premature contractions including going into early labor and delivery.  Cervical dystocia, which occurs when the cervix is unable to dilate normally during labor.  What happens before the procedure? Staying hydrated Follow instructions from your health care provider about hydration, which may include:  Up to 2 hours before the procedure - you may continue to drink clear liquids, such as water, clear fruit juice, black coffee, and plain tea.  Eating and drinking restrictions Follow instructions from your health care provider about eating and drinking, which may include:  8 hours before the procedure - stop eating heavy meals or foods such as meat, fried foods, or fatty foods.  6 hours before the procedure - stop eating  light meals or foods, such as toast or cereal.  6 hours before the procedure - stop drinking milk or drinks that contain milk.  2 hours before the procedure - stop drinking clear liquids.  Medicines  Ask your health care provider about: ? Changing or stopping your regular medicines. This is especially important if you are taking diabetes medicines or blood  thinners. ? Taking medicines such as aspirin and ibuprofen. These medicines can thin your blood. Do not take these medicines before your procedure if your health care provider instructs you not to.  You may be given antibiotic medicine to help prevent infection. General instructions  Do not put on any lotion, deodorant, or perfume.  Remove contact lenses and jewelry.  Ask your health care provider how your surgical site will be marked or identified.  You may have an exam or testing.  You may have a blood or urine sample taken.  Plan to have someone take you home from the hospital or clinic.  If you will be going home right after the procedure, plan to have someone with you for 24 hours. What happens during the procedure?  To reduce your risk of infection: ? Your health care team will wash or sanitize their hands. ? Your skin will be washed with soap.  An IV tube will be inserted into one of your veins.  You may be given one or more of the following: ? A medicine to help you relax (sedative). ? A medicine to numb the area (local anesthetic). ? A medicine to make you fall asleep (general anesthetic). ? A medicine that is injected into your spine to numb the area below and slightly above the injection site (spinal anesthetic). ? A medicine that is injected into an area of your body to numb everything below the injection site (regional anesthetic).  A lubricated instrument (speculum) will be inserted into your vagina. The speculum will be widened to open the walls of your vagina so your surgeon can see your cervix.  Your cervix will be grasped and tightly stitched closed (sutured). To do this, your surgeon will stitch a strong band of thread around your cervix, then the thread will be tightened to hold your cervix shut. The procedure may vary among health care providers and hospitals. What happens after the procedure?  Your blood pressure, heart rate, breathing rate, and blood  oxygen level will be monitored until the medicines you were given have worn off. You will be monitored for premature contractions.  You may have light bleeding and mild cramping.  You may have to wear compression stockings. These stockings help to prevent blood clots and reduce swelling in your legs.  Do not drive for 24 hours if you received a sedative.  You may be put on bed rest.  You may be given medicine to prevent infection.  You may be given an injection of a hormone (progesterone) to prevent your uterus from tightening (contracting). Summary  Cervical cerclage is a surgical procedure that involves using stitches to sew the cervix shut during pregnancy.  Your blood pressure, heart rate, breathing rate, and blood oxygen level will be monitored until the medicines you were given have worn off. You will be monitored for premature contractions.  You may need to be on bed rest after the procedure.  Plan to have someone take you home from the hospital or clinic. This information is not intended to replace advice given to you by your health care provider. Make sure you  discuss any questions you have with your health care provider. Document Released: 10/27/2008 Document Revised: 07/08/2016 Document Reviewed: 06/30/2016 Elsevier Interactive Patient Education  2018 [employer]vier Inc.  Cervical Cerclage, Care After This sheet gives you information about how to care for yourself after your procedure. Your health care provider may also give you more specific instructions. If you have problems or questions, contact your health care provider. What can I expect after the procedure? After your procedure, it is common to have:  Cramping in your abdomen.  Mucus discharge for several days.  Painful urination (dysuria).  Small drops of blood coming from your vagina (spotting).  Follow these instructions at home:  Follow instructions from your health care provider about bed rest, if this  applies. You may need to be on bed rest for up to 3 days.  Take over-the-counter and prescription medicines only as told by your health care provider.  Do not drive or use heavy machinery while taking prescription pain medicine.  Keep track of your vaginal discharge and watch for any changes. If you notice changes, tell your health care provider.  Avoid physical activities and exercise until your health care provider approves. Ask your health care provider what activities are safe for you.  Until your health care provider approves: ? Do not douche. ? Do not have sexual intercourse.  Keep all pre-birth (prenatal) visits and all follow-up visits as told by your health care provider. This is important. You will probably have weekly visits to have your cervix checked, and you may need an ultrasound. Contact a health care provider if:  You have abnormal or bad-smelling vaginal discharge, such as clots.  You develop a rash on your skin. This may look like redness and swelling.  You become light-headed or feel like you are going to faint.  You have abdominal pain that does not get better with medicine.  You have persistent nausea or vomiting. Get help right away if:  You have vaginal bleeding that is heavier or more frequent than spotting.  You are leaking fluid or have a gush of fluid from your vagina (your water breaks).  You have a fever or chills.  You faint.  You have uterine contractions. These may feel like: ? A back ache. ? Lower abdominal pain. ? Mild cramps, similar to menstrual cramps. ? Tightening or pressure in your abdomen.  You think that your baby is not moving as much as usual, or you cannot feel your baby move.  You have chest pain.  You have shortness of breath. This information is not intended to replace advice given to you by your health care provider. Make sure you discuss any questions you have with your health care provider. Document Released:  09/04/2013 Document Revised: 07/13/2016 Document Reviewed: 06/17/2016 Elsevier Interactive Patient Education  2018 [employer].  General Anesthesia, Adult General anesthesia is the use of medicines to make a person "go to sleep" (be unconscious) for a medical procedure. General anesthesia is often recommended when a procedure:  Is long.  Requires you to be still or in an unusual position.  Is major and can cause you to lose blood.  Is impossible to do without general anesthesia.  The medicines used for general anesthesia are called general anesthetics. In addition to making you sleep, the medicines:  Prevent pain.  Control your blood pressure.  Relax your muscles.  Tell a health care provider about:  Any allergies you have.  All medicines you are taking, including vitamins, herbs,  eye drops, creams, and over-the-counter medicines.  Any problems you or family members have had with anesthetic medicines.  Types of anesthetics you have had in the past.  Any bleeding disorders you have.  Any surgeries you have had.  Any medical conditions you have.  Any history of heart or lung conditions, such as heart failure, sleep apnea, or chronic obstructive pulmonary disease (COPD).  Whether you are pregnant or may be pregnant.  Whether you use tobacco, alcohol, marijuana, or street drugs.  Any history of Financial planner.  Any history of depression or anxiety. What are the risks? Generally, this is a safe procedure. However, problems may occur, including:  Allergic reaction to anesthetics.  Lung and heart problems.  Inhaling food or liquids from your stomach into your lungs (aspiration).  Injury to nerves.  Waking up during your procedure and being unable to move (rare).  Extreme agitation or a state of mental confusion (delirium) when you wake up from the anesthetic.  Air in the bloodstream, which can lead to stroke.  These problems are more likely to develop if  you are having a major surgery or if you have an advanced medical condition. You can prevent some of these complications by answering all of your health care provider's questions thoroughly and by following all pre-procedure instructions. General anesthesia can cause side effects, including:  Nausea or vomiting  A sore throat from the breathing tube.  Feeling cold or shivery.  Feeling tired, washed out, or achy.  Sleepiness or drowsiness.  Confusion or agitation.  What happens before the procedure? Staying hydrated Follow instructions from your health care provider about hydration, which may include:  Up to 2 hours before the procedure - you may continue to drink clear liquids, such as water, clear fruit juice, black coffee, and plain tea.  Eating and drinking restrictions Follow instructions from your health care provider about eating and drinking, which may include:  8 hours before the procedure - stop eating heavy meals or foods such as meat, fried foods, or fatty foods.  6 hours before the procedure - stop eating light meals or foods, such as toast or cereal.  6 hours before the procedure - stop drinking milk or drinks that contain milk.  2 hours before the procedure - stop drinking clear liquids.  Medicines  Ask your health care provider about: ? Changing or stopping your regular medicines. This is especially important if you are taking diabetes medicines or blood thinners. ? Taking medicines such as aspirin and ibuprofen. These medicines can thin your blood. Do not take these medicines before your procedure if your health care provider instructs you not to. ? Taking new dietary supplements or medicines. Do not take these during the week before your procedure unless your health care provider approves them.  If you are told to take a medicine or to continue taking a medicine on the day of the procedure, take the medicine with sips of water. General instructions   Ask if  you will be going home the same day, the following day, or after a longer hospital stay. ? Plan to have someone take you home. ? Plan to have someone stay with you for the first 24 hours after you leave the hospital or clinic.  For 3-6 weeks before the procedure, try not to use any tobacco products, such as cigarettes, chewing tobacco, and e-cigarettes.  You may brush your teeth on the morning of the procedure, but make sure to spit out the toothpaste.  What happens during the procedure?  You will be given anesthetics through a mask and through an IV tube in one of your veins.  You may receive medicine to help you relax (sedative).  As soon as you are asleep, a breathing tube may be used to help you breathe.  An anesthesia specialist will stay with you throughout the procedure. He or she will help keep you comfortable and safe by continuing to give you medicines and adjusting the amount of medicine that you get. He or she will also watch your blood pressure, pulse, and oxygen levels to make sure that the anesthetics do not cause any problems.  If a breathing tube was used to help you breathe, it will be removed before you wake up. The procedure may vary among health care providers and hospitals. What happens after the procedure?  You will wake up, often slowly, after the procedure is complete, usually in a recovery area.  Your blood pressure, heart rate, breathing rate, and blood oxygen level will be monitored until the medicines you were given have worn off.  You may be given medicine to help you calm down if you feel anxious or agitated.  If you will be going home the same day, your health care provider may check to make sure you can stand, drink, and urinate.  Your health care providers will treat your pain and side effects before you go home.  Do not drive for 24 hours if you received a sedative.  You may: ? Feel nauseous and vomit. ? Have a sore throat. ? Have mental  slowness. ? Feel cold or shivery. ? Feel sleepy. ? Feel tired. ? Feel sore or achy, even in parts of your body where you did not have surgery. This information is not intended to replace advice given to you by your health care provider. Make sure you discuss any questions you have with your health care provider. Document Released: 02/21/2008 Document Revised: 04/26/2016 Document Reviewed: 10/29/2015 Elsevier Interactive Patient Education  2018 [employer] General Anesthesia, Adult, Care After These instructions provide you with information about caring for yourself after your procedure. Your health care provider may also give you more specific instructions. Your treatment has been planned according to current medical practices, but problems sometimes occur. Call your health care provider if you have any problems or questions after your procedure. What can I expect after the procedure? After the procedure, it is common to have:  Vomiting.  A sore throat.  Mental slowness.  It is common to feel:  Nauseous.  Cold or shivery.  Sleepy.  Tired.  Sore or achy, even in parts of your body where you did not have surgery.  Follow these instructions at home: For at least 24 hours after the procedure:  Do not: ? Participate in activities where you could fall or become injured. ? Drive. ? Use heavy machinery. ? Drink alcohol. ? Take sleeping pills or medicines that cause drowsiness. ? Make important decisions or sign legal documents. ? Take care of children on your own.  Rest. Eating and drinking  If you vomit, drink water, juice, or soup when you can drink without vomiting.  Drink enough fluid to keep your urine clear or pale yellow.  Make sure you have little or no nausea before eating solid foods.  Follow the diet recommended by your health care provider. General instructions  Have a responsible adult stay with you until you are awake and alert.  Return to your normal  activities as told by your health care provider. Ask your health care provider what activities are safe for you.  Take over-the-counter and prescription medicines only as told by your health care provider.  If you smoke, do not smoke without supervision.  Keep all follow-up visits as told by your health care provider. This is important. Contact a health care provider if:  You continue to have nausea or vomiting at home, and medicines are not helpful.  You cannot drink fluids or start eating again.  You cannot urinate after 8-12 hours.  You develop a skin rash.  You have fever.  You have increasing redness at the site of your procedure. Get help right away if:  You have difficulty breathing.  You have chest pain.  You have unexpected bleeding.  You feel that you are having a life-threatening or urgent problem. This information is not intended to replace advice given to you by your health care provider. Make sure you discuss any questions you have with your health care provider. Document Released: 02/20/2001 Document Revised: 04/18/2016 Document Reviewed: 10/29/2015 Elsevier Interactive Patient Education  [employer].

## 2018-01-26 ENCOUNTER — Encounter (HOSPITAL_COMMUNITY): Payer: Self-pay

## 2018-01-26 ENCOUNTER — Inpatient Hospital Stay (HOSPITAL_COMMUNITY)
Admission: AD | Admit: 2018-01-26 | Discharge: 2018-01-26 | Disposition: A | Discharge status: Home / Self Care | Payer: Medicaid Other | Source: Ambulatory Visit | Attending: Family Medicine | Admitting: Family Medicine

## 2018-01-26 ENCOUNTER — Other Ambulatory Visit: Payer: Self-pay | Admitting: Obstetrics & Gynecology

## 2018-01-26 ENCOUNTER — Encounter (HOSPITAL_COMMUNITY)
Admission: RE | Admit: 2018-01-26 | Discharge: 2018-01-26 | Disposition: A | Discharge status: Home / Self Care | Payer: Medicaid Other | Source: Ambulatory Visit | Attending: Obstetrics & Gynecology | Admitting: Obstetrics & Gynecology

## 2018-01-26 ENCOUNTER — Other Ambulatory Visit: Payer: Self-pay

## 2018-01-26 DIAGNOSIS — R102 Pelvic and perineal pain: Secondary | ICD-10-CM | POA: Insufficient documentation

## 2018-01-26 DIAGNOSIS — O9989 Other specified diseases and conditions complicating pregnancy, childbirth and the puerperium: Secondary | ICD-10-CM

## 2018-01-26 DIAGNOSIS — O3430 Maternal care for cervical incompetence, unspecified trimester: Secondary | ICD-10-CM | POA: Insufficient documentation

## 2018-01-26 DIAGNOSIS — Z8742 Personal history of other diseases of the female genital tract: Secondary | ICD-10-CM

## 2018-01-26 DIAGNOSIS — Z3A14 14 weeks gestation of pregnancy: Secondary | ICD-10-CM | POA: Diagnosis not present

## 2018-01-26 DIAGNOSIS — Z7982 Long term (current) use of aspirin: Secondary | ICD-10-CM | POA: Diagnosis not present

## 2018-01-26 DIAGNOSIS — N949 Unspecified condition associated with female genital organs and menstrual cycle: Secondary | ICD-10-CM | POA: Diagnosis not present

## 2018-01-26 DIAGNOSIS — Z01818 Encounter for other preprocedural examination: Secondary | ICD-10-CM | POA: Diagnosis present

## 2018-01-26 DIAGNOSIS — Z3A00 Weeks of gestation of pregnancy not specified: Secondary | ICD-10-CM | POA: Insufficient documentation

## 2018-01-26 DIAGNOSIS — R1030 Lower abdominal pain, unspecified: Secondary | ICD-10-CM | POA: Diagnosis present

## 2018-01-26 DIAGNOSIS — Z283 Underimmunization status: Secondary | ICD-10-CM

## 2018-01-26 DIAGNOSIS — Z8759 Personal history of other complications of pregnancy, childbirth and the puerperium: Secondary | ICD-10-CM

## 2018-01-26 DIAGNOSIS — O09899 Supervision of other high risk pregnancies, unspecified trimester: Secondary | ICD-10-CM

## 2018-01-26 DIAGNOSIS — Z3402 Encounter for supervision of normal first pregnancy, second trimester: Secondary | ICD-10-CM

## 2018-01-26 DIAGNOSIS — O26892 Other specified pregnancy related conditions, second trimester: Secondary | ICD-10-CM | POA: Insufficient documentation

## 2018-01-26 LAB — CBC
HEMATOCRIT: 35.8 % — AB (ref 36.0–46.0)
HEMOGLOBIN: 12.2 g/dL (ref 12.0–15.0)
MCH: 28.8 pg (ref 26.0–34.0)
MCHC: 34.1 g/dL (ref 30.0–36.0)
MCV: 84.6 fL (ref 78.0–100.0)
Platelets: 344 10*3/uL (ref 150–400)
RBC: 4.23 MIL/uL (ref 3.87–5.11)
RDW: 12.9 % (ref 11.5–15.5)
WBC: 11.9 10*3/uL — ABNORMAL HIGH (ref 4.0–10.5)

## 2018-01-26 LAB — BASIC METABOLIC PANEL
ANION GAP: 12 (ref 5–15)
BUN: 7 mg/dL (ref 6–20)
CALCIUM: 9.1 mg/dL (ref 8.9–10.3)
CO2: 21 mmol/L — AB (ref 22–32)
Chloride: 102 mmol/L (ref 101–111)
Creatinine, Ser: 0.55 mg/dL (ref 0.44–1.00)
Glucose, Bld: 83 mg/dL (ref 65–99)
Potassium: 3.6 mmol/L (ref 3.5–5.1)
Sodium: 135 mmol/L (ref 135–145)

## 2018-01-26 LAB — URINALYSIS, ROUTINE W REFLEX MICROSCOPIC
BILIRUBIN URINE: NEGATIVE
BILIRUBIN URINE: NEGATIVE
GLUCOSE, UA: NEGATIVE mg/dL
Glucose, UA: NEGATIVE mg/dL
HGB URINE DIPSTICK: NEGATIVE
HGB URINE DIPSTICK: NEGATIVE
Ketones, ur: NEGATIVE mg/dL
Ketones, ur: NEGATIVE mg/dL
Leukocytes, UA: NEGATIVE
Leukocytes, UA: NEGATIVE
NITRITE: NEGATIVE
Nitrite: NEGATIVE
PH: 6 (ref 5.0–8.0)
Protein, ur: NEGATIVE mg/dL
Protein, ur: NEGATIVE mg/dL
SPECIFIC GRAVITY, URINE: 1.019 (ref 1.005–1.030)
SPECIFIC GRAVITY, URINE: 1.005 (ref 1.005–1.030)
pH: 8 (ref 5.0–8.0)

## 2018-01-26 NOTE — MAU Provider Note (Signed)
History     CSN: [id number]  Arrival date and time: 01/26/18 2119   First Provider Initiated Contact with Patient 01/26/18 2145      Chief Complaint  Patient presents with  . Abdominal Pain   HPI [name] is a 22 y.o. [id number] at [redacted]w[redacted]d who presents to MAU today with complaint of lower abdominal cramping. She states this was slightly more than she expected and started to wrap around to her back today. She has a history of incompetent cervix and had a 26 week PTD with her first pregnancy. She had a full-term delivery with her last child with a cerclage. She denies vaginal bleeding or abnormal discharge. She states pain has improved since onset. She has not taken any pain medications. Pain is mild at this time.    OB History    Gravida Para Term Preterm AB Living   4 2 1 1 1 2    SAB TAB Ectopic Multiple Live Births   1     0 2      Past Medical History:  Diagnosis Date  . Anxiety   . Cervical incompetence during pregnancy   . Depression   . Hypothyroidism   . PCOS (polycystic ovarian syndrome)     Past Surgical History:  Procedure Laterality Date  . CERVICAL CERCLAGE    . CERVICAL CERCLAGE N/A 03/09/2016   Procedure: [facility] CERCLAGE PLACEMENT;  Surgeon: Lazaro Arms, MD;  Location: [facility];  Service: Gynecology;  Laterality: N/A;  . labial reduction      Family History  Problem Relation Age of Onset  . Arthritis Mother   . Lupus Mother   . Autoimmune disease Mother   . Crohn's disease Father   . Cancer Maternal Grandmother        breast  . Heart disease Maternal Grandfather   . Heart disease Paternal Grandfather   . Hypertension Paternal Grandfather   . Lupus Maternal Aunt     Social History   Tobacco Use  . Smoking status: Never Smoker  . Smokeless tobacco: Never Used  Substance Use Topics  . Alcohol use: No    Frequency: Never    Comment: occ; not now  . Drug use: No    Allergies:  Allergies  Allergen Reactions  . Shellfish Allergy    Only Crawfish not allergic to any other shellfish     Medications Prior to Admission  Medication Sig Dispense Refill Last Dose  . aspirin EC 81 MG tablet Take 162 mg by mouth daily.   01/25/2018 at Unknown time  . Prenatal MV & Min w/FA-DHA (PRENATAL ADULT GUMMY/DHA/FA PO) Take 2 tablets by mouth daily.   01/25/2018 at Unknown time    Review of Systems  Constitutional: Negative for fever.  Gastrointestinal: Positive for abdominal pain. Negative for constipation, diarrhea, nausea and vomiting.  Genitourinary: Positive for vaginal discharge. Negative for dysuria, frequency, urgency and vaginal bleeding.  Musculoskeletal: Positive for back pain.   Physical Exam   Blood pressure 135/75, pulse (!) 108, temperature 98.5 F (36.9 C), temperature source Oral, resp. rate 18, height 4\' 11"  (1.499 m), weight 201 lb (91.2 kg), last menstrual period 10/12/2017, not currently breastfeeding.  Physical Exam  Nursing note and vitals reviewed. Constitutional: She is oriented to person, place, and time. She appears well-developed and well-nourished. No distress.  HENT:  Head: Normocephalic and atraumatic.  Cardiovascular: Normal rate.  Respiratory: Effort normal.  GI: Soft. She exhibits no distension and no mass. There is no  tenderness. There is no rebound and no guarding.  Genitourinary: Uterus is enlarged (appropriate for GA). Uterus is not tender. Cervix exhibits no motion tenderness, no discharge and no friability. No bleeding in the vagina. Vaginal discharge (small white) found.  Neurological: She is alert and oriented to person, place, and time.  Skin: Skin is warm and dry. No erythema.  Psychiatric: She has a normal mood and affect.  Dilation: Closed Effacement (%): Thick Exam by:: Magnus SinningWenzel, PA   Results for orders placed or performed during the hospital encounter of 01/26/18 (from the past 24 hour(s))  Basic metabolic panel     Status: Abnormal   Collection Time: 01/26/18 10:30 AM  Result  Value Ref Range   Sodium 135 135 - 145 mmol/L   Potassium 3.6 3.5 - 5.1 mmol/L   Chloride 102 101 - 111 mmol/L   CO2 21 (L) 22 - 32 mmol/L   Glucose, Bld 83 65 - 99 mg/dL   BUN 7 6 - 20 mg/dL   Creatinine, Ser 8.290.55 0.44 - 1.00 mg/dL   Calcium 9.1 8.9 - 56.210.3 mg/dL   GFR calc non Af Amer >60 >60 mL/min   GFR calc Af Amer >60 >60 mL/min   Anion gap 12 5 - 15  CBC     Status: Abnormal   Collection Time: 01/26/18 10:30 AM  Result Value Ref Range   WBC 11.9 (H) 4.0 - 10.5 K/uL   RBC 4.23 3.87 - 5.11 MIL/uL   Hemoglobin 12.2 12.0 - 15.0 g/dL   HCT 13.035.8 (L) 86.536.0 - 78.446.0 %   MCV 84.6 78.0 - 100.0 fL   MCH 28.8 26.0 - 34.0 pg   MCHC 34.1 30.0 - 36.0 g/dL   RDW 69.612.9 29.511.5 - 28.415.5 %   Platelets 344 150 - 400 K/uL  Urinalysis, Routine w reflex microscopic     Status: None   Collection Time: 01/26/18 10:30 AM  Result Value Ref Range   Color, Urine YELLOW YELLOW   APPearance CLEAR CLEAR   Specific Gravity, Urine 1.019 1.005 - 1.030   pH 6.0 5.0 - 8.0   Glucose, UA NEGATIVE NEGATIVE mg/dL   Hgb urine dipstick NEGATIVE NEGATIVE   Bilirubin Urine NEGATIVE NEGATIVE   Ketones, ur NEGATIVE NEGATIVE mg/dL   Protein, ur NEGATIVE NEGATIVE mg/dL   Nitrite NEGATIVE NEGATIVE   Leukocytes, UA NEGATIVE NEGATIVE    MAU Course  Procedures None  MDM Reviewed labs from pre-op appointment earlier today  FHR - 154 bpm Cervix closed and thick  Assessment and Plan  A:  SIUP at 718w0d Round ligament pain   P:  Discharge home Tylenol PRN for pain Abdominal binder PRN Second trimester precautions discussed Patient advised to follow-up as scheduled for cerclage placement on 01/31/18 Patient may return to MAU as needed or if her condition were to change or worsen  Vonzella NippleJulie Wenzel, PA-C 01/26/2018, 9:45 PM

## 2018-01-26 NOTE — Discharge Instructions (Signed)
Cervical Insufficiency °Cervical insufficiency is when the cervix is weak and starts to open (dilate) and thin (efface) before the pregnancy is at term and before labor starts. This is also called incompetent cervix. It can happen during the second or third trimester when the fetus starts putting pressure on the cervix. Treatment may reduce the risk of problems for you and your baby. Cervical insufficiency can lead to: °· Loss of the baby (miscarriage). °· Breaking of the sac that holds the baby (amniotic sac). This is also called preterm premature rupture of the membranes,PPROM. °· The baby being born early (preterm birth). ° °What are the causes? °The cause of this condition is not well known. However, it may be caused by abnormalities in the cervix and other factors such as inflammation or infection. °What increases the risk? °This condition is more likely to develop if: °· You have a shorter cervix than normal. °· Your cervix was damaged or injured during a past pregnancy or surgery. °· You were born with a cervical defect. °· You have had a procedure done on the cervix, such as cervical biopsy. °· You have a history of: °? Cervical insufficiency. °? PPROM. °· You have ended several pregnancies through abortion. °· You were exposed to the drug diethylstilbestrol (DES). ° °What are the signs or symptoms? °Symptoms of this condition can vary. Sometimes there no symptoms for this condition, and at other times there are mild symptoms that start between weeks 14 and 20 of pregnancy. The symptoms may last several days or weeks. These symptoms include: °· Light spotting or bleeding from the vagina. °· Pelvic pressure. °· A change in vaginal discharge, such as changes from clear, white, or light yellow to pink or tan. °· Back pain. °· Abdominal pain or cramping. ° °How is this diagnosed? °This condition may be diagnosed based on: °· Your symptoms. °· Your medical history, including: °? Any problems during past  pregnancies, such as miscarriages. °? Any procedures performed on your cervix. °? Any history of cervical insufficiency. ° °During the second trimester, cervical insufficiency may be diagnosed based on: °· An ultrasound done with a probe inserted into your vagina (transvaginal ultrasound). °· A pelvic exam. °· Tests of fluid in the amniotic sac. This is done to rule out infection. ° °How is this treated? °This condition may be managed by: °· Limiting physical activity. °· Limiting activity at home or in the hospital. °· Pelvic rest. This means that there should be no sexual intercourse or placing anything in the vagina. °· A procedure to sew the cervix closed and prevent it from opening too early (cerclage). The stitches (sutures) are removed between weeks 36 and 38 to avoid problems during labor. Cerclage may be recommended if: °? You have a history of miscarriages or preterm births without a known cause. °? You have a short cervix. A short cervix is identified by ultrasound. °? Your cervix has dilated before 24 weeks of pregnancy. ° °Follow these instructions at home: °· Get plenty of rest and lessen activity as told by your health care provider. Ask your health care provider what activities are safe for you. °· If pelvic rest was recommended, you shouldnot have sex, use tampons, douche, or place anything inside your vagina until your health care provider says that this is okay. °· Take over-the-counter and prescription medicines only as told by your health care provider. °· Keep all follow-up visits and prenatal visits as told by your health care provider. This is important. °  Get help right away if: °· You have vaginal bleeding, even if it is a small amount or even if it is painless. °· You have pain in your abdomen or your lower back. °· You have a feeling of increased pressure in your pelvis. °· You have vaginal discharge that changes from clear, white, or light yellow to pink or tan. °· You have a  fever. °· You have severe nausea or vomiting. °Summary °· Cervical insufficiency is when the cervix is weak and starts to dilate and efface before the pregnancy is at term and before labor starts. °· Symptoms of this condition can vary from no symptoms to mild symptoms that start between weeks 14 and 20 of pregnancy. The symptoms may last several days or weeks. °· This condition may be managed by limiting physical activity, having pelvic rest, or having cervical cerclage. °· If pelvic rest was recommended, you should not have sex, use tampons, use a douche, or place anything inside your vagina until your health care provider says that this is okay. °This information is not intended to replace advice given to you by your health care provider. Make sure you discuss any questions you have with your health care provider. °Document Released: 11/14/2005 Document Revised: 11/17/2016 Document Reviewed: 11/17/2016 °Elsevier Interactive Patient Education © 2017 [employer] ° °

## 2018-01-26 NOTE — MAU Note (Signed)
Pt presents to MAU with c/o lower abdominal pain that started tonight. Pt denies VB. Pt is scheduled for cerclage on 01/31/18.

## 2018-01-31 ENCOUNTER — Ambulatory Visit (HOSPITAL_COMMUNITY): Payer: Medicaid Other | Admitting: Anesthesiology

## 2018-01-31 ENCOUNTER — Ambulatory Visit (HOSPITAL_COMMUNITY)
Admission: RE | Admit: 2018-01-31 | Discharge: 2018-01-31 | Disposition: A | Discharge status: Home / Self Care | Payer: Medicaid Other | Source: Ambulatory Visit | Attending: Obstetrics & Gynecology | Admitting: Obstetrics & Gynecology

## 2018-01-31 ENCOUNTER — Encounter (HOSPITAL_COMMUNITY): Payer: Self-pay | Admitting: *Deleted

## 2018-01-31 ENCOUNTER — Encounter (HOSPITAL_COMMUNITY)
Admission: RE | Disposition: A | Discharge status: Home / Self Care | Payer: Self-pay | Source: Ambulatory Visit | Attending: Obstetrics & Gynecology

## 2018-01-31 DIAGNOSIS — Z3A14 14 weeks gestation of pregnancy: Secondary | ICD-10-CM | POA: Insufficient documentation

## 2018-01-31 DIAGNOSIS — O3432 Maternal care for cervical incompetence, second trimester: Secondary | ICD-10-CM | POA: Insufficient documentation

## 2018-01-31 DIAGNOSIS — O09292 Supervision of pregnancy with other poor reproductive or obstetric history, second trimester: Secondary | ICD-10-CM

## 2018-01-31 DIAGNOSIS — E039 Hypothyroidism, unspecified: Secondary | ICD-10-CM | POA: Insufficient documentation

## 2018-01-31 DIAGNOSIS — O99282 Endocrine, nutritional and metabolic diseases complicating pregnancy, second trimester: Secondary | ICD-10-CM | POA: Diagnosis not present

## 2018-01-31 HISTORY — PX: CERVICAL CERCLAGE: SHX1329

## 2018-01-31 SURGERY — CERCLAGE, CERVIX, VAGINAL APPROACH
Anesthesia: Spinal

## 2018-01-31 MED ORDER — SODIUM CHLORIDE 0.9 % IJ SOLN
INTRAMUSCULAR | Status: AC
  Filled 2018-01-31: qty 10

## 2018-01-31 MED ORDER — INDOMETHACIN 50 MG RE SUPP
50.0000 mg | Freq: Once | RECTAL | Status: AC
  Administered 2018-01-31: 50 mg via RECTAL

## 2018-01-31 MED ORDER — EPHEDRINE SULFATE 50 MG/ML IJ SOLN
INTRAMUSCULAR | Status: DC | PRN
  Administered 2018-01-31: 10 mg via INTRAVENOUS

## 2018-01-31 MED ORDER — BUPIVACAINE IN DEXTROSE 0.75-8.25 % IT SOLN
INTRATHECAL | Status: DC | PRN
  Administered 2018-01-31: 12 mg via INTRATHECAL

## 2018-01-31 MED ORDER — LACTATED RINGERS IV SOLN
INTRAVENOUS | Status: DC
  Administered 2018-01-31: 11:00:00 via INTRAVENOUS

## 2018-01-31 MED ORDER — FENTANYL CITRATE (PF) 100 MCG/2ML IJ SOLN
INTRAMUSCULAR | Status: AC
  Filled 2018-01-31: qty 2

## 2018-01-31 MED ORDER — MIDAZOLAM HCL 5 MG/5ML IJ SOLN
INTRAMUSCULAR | Status: DC | PRN
  Administered 2018-01-31 (×2): 1 mg via INTRAVENOUS

## 2018-01-31 MED ORDER — BUPIVACAINE IN DEXTROSE 0.75-8.25 % IT SOLN
INTRATHECAL | Status: AC
  Filled 2018-01-31: qty 2

## 2018-01-31 MED ORDER — ONDANSETRON HCL 4 MG/2ML IJ SOLN
INTRAMUSCULAR | Status: DC | PRN
  Administered 2018-01-31: 4 mg via INTRAVENOUS

## 2018-01-31 MED ORDER — INDOMETHACIN 25 MG SUPPOSITORY
50.0000 mg | Freq: Once | RECTAL | Status: DC
  Filled 2018-01-31: qty 2

## 2018-01-31 MED ORDER — MIDAZOLAM HCL 2 MG/2ML IJ SOLN
INTRAMUSCULAR | Status: AC
  Filled 2018-01-31: qty 2

## 2018-01-31 MED ORDER — EPHEDRINE SULFATE 50 MG/ML IJ SOLN
INTRAMUSCULAR | Status: AC
  Filled 2018-01-31: qty 1

## 2018-01-31 MED ORDER — CEFAZOLIN SODIUM-DEXTROSE 2-4 GM/100ML-% IV SOLN
2.0000 g | INTRAVENOUS | Status: AC
  Administered 2018-01-31: 2 g via INTRAVENOUS
  Filled 2018-01-31: qty 100

## 2018-01-31 MED ORDER — MIDAZOLAM HCL 2 MG/2ML IJ SOLN
1.0000 mg | INTRAMUSCULAR | Status: AC
  Administered 2018-01-31: 2 mg via INTRAVENOUS
  Filled 2018-01-31: qty 2

## 2018-01-31 MED ORDER — INDOMETHACIN 25 MG PO CAPS: 25.0000 mg | ORAL_CAPSULE | Freq: Two times a day (BID) | ORAL | 0 refills | Status: DC

## 2018-01-31 SURGICAL SUPPLY — 22 items
BAG HAMPER (MISCELLANEOUS) ×3 IMPLANT
CLOTH BEACON ORANGE TIMEOUT ST (SAFETY) ×3 IMPLANT
COVER LIGHT HANDLE STERIS (MISCELLANEOUS) ×6 IMPLANT
GAUZE SPONGE 4X4 16PLY XRAY LF (GAUZE/BANDAGES/DRESSINGS) ×3 IMPLANT
GLOVE BIOGEL PI IND STRL 7.0 (GLOVE) ×1 IMPLANT
GLOVE BIOGEL PI IND STRL 8 (GLOVE) ×1 IMPLANT
GLOVE BIOGEL PI INDICATOR 7.0 (GLOVE) ×2
GLOVE BIOGEL PI INDICATOR 8 (GLOVE) ×2
GLOVE ECLIPSE 8.0 STRL XLNG CF (GLOVE) ×3 IMPLANT
GOWN STRL REUS W/TWL LRG LVL3 (GOWN DISPOSABLE) ×6 IMPLANT
GOWN STRL REUS W/TWL XL LVL3 (GOWN DISPOSABLE) ×3 IMPLANT
KIT TURNOVER KIT A (KITS) ×3 IMPLANT
MANIFOLD NEPTUNE II (INSTRUMENTS) ×3 IMPLANT
NS IRRIG 1000ML POUR BTL (IV SOLUTION) ×3 IMPLANT
PACK PERI GYN (CUSTOM PROCEDURE TRAY) ×3 IMPLANT
PAD ARMBOARD 7.5X6 YLW CONV (MISCELLANEOUS) ×3 IMPLANT
SET BASIN LINEN APH (SET/KITS/TRAYS/PACK) ×3 IMPLANT
SUT MERSILENE FIBER S 5 CTX 12 (SUTURE) ×6 IMPLANT
SUT PROLENE 1 CT 1 30 (SUTURE) IMPLANT
SUT SILK 2 0 SH (SUTURE) ×3 IMPLANT
TOWEL OR 17X26 4PK STRL BLUE (TOWEL DISPOSABLE) IMPLANT
TRAY FOLEY CATH SILVER 16FR (SET/KITS/TRAYS/PACK) ×3 IMPLANT

## 2018-01-31 NOTE — Anesthesia Procedure Notes (Signed)
Spinal  Patient location during procedure: OR Start time: 01/31/2018 1:03 PM Staffing Resident/CRNA: Despina HiddenIdacavage, Catrice Zuleta J, CRNA Preanesthetic Checklist Completed: patient identified, site marked, surgical consent, pre-op evaluation, timeout performed, IV checked, risks and benefits discussed and monitors and equipment checked Spinal Block Patient position: sitting Prep: DuraPrep Patient monitoring: heart rate, continuous pulse ox, blood pressure and cardiac monitor Approach: midline Location: L3-4 Injection technique: single-shot Needle Needle type: Sprotte  Needle gauge: 24 G Needle length: 9 cm Assessment Sensory level: T6 Additional Notes [id number]        28 May 2019

## 2018-01-31 NOTE — Discharge Instructions (Signed)
Cervical Cerclage, Care After  This sheet gives you information about how to care for yourself after your procedure. Your health care provider may also give you more specific instructions. If you have problems or questions, contact your health care provider.  What can I expect after the procedure?  After your procedure, it is common to have:   Cramping in your abdomen.   Mucus discharge for several days.   Painful urination (dysuria).   Small drops of blood coming from your vagina (spotting).    Follow these instructions at home:   Follow instructions from your health care provider about bed rest, if this applies. You may need to be on bed rest for up to 3 days.   Take over-the-counter and prescription medicines only as told by your health care provider.   Do not drive or use heavy machinery while taking prescription pain medicine.   Keep track of your vaginal discharge and watch for any changes. If you notice changes, tell your health care provider.   Avoid physical activities and exercise until your health care provider approves. Ask your health care provider what activities are safe for you.   Until your health care provider approves:  ? Do not douche.  ? Do not have sexual intercourse.   Keep all pre-birth (prenatal) visits and all follow-up visits as told by your health care provider. This is important. You will probably have weekly visits to have your cervix checked, and you may need an ultrasound.    Contact a health care provider if:   You have abnormal or bad-smelling vaginal discharge, such as clots.   You develop a rash on your skin. This may look like redness and swelling.   You become light-headed or feel like you are going to faint.   You have abdominal pain that does not get better with medicine.   You have persistent nausea or vomiting.    Get help right away if:   You have vaginal bleeding that is heavier or more frequent than spotting.   You are leaking fluid or have a gush of  fluid from your vagina (your water breaks).   You have a fever or chills.   You faint.   You have uterine contractions. These may feel like:  ? A back ache.  ? Lower abdominal pain.  ? Mild cramps, similar to menstrual cramps.  ? Tightening or pressure in your abdomen.   You think that your baby is not moving as much as usual, or you cannot feel your baby move.   You have chest pain.   You have shortness of breath.  This information is not intended to replace advice given to you by your health care provider. Make sure you discuss any questions you have with your health care provider.    Document Released: 09/04/2013 Document Revised: 07/13/2016 Document Reviewed: 06/17/2016  Elsevier Interactive Patient Education  2018 [employer]

## 2018-01-31 NOTE — Op Note (Signed)
Preoperative diagnosis:  1.  Intrauterine pregnancy at 398w5d  weeks gestation                                         2.  history of cervical incompetence with previous pregnancy  Postoperative diagnosis:  Same as above  Procedure:  Placement of a McDonald cerclage, using 5 mm Mersilene tape  Surgeon:  Rockne CoonsLuther H Lysbeth Dicola Jr MD  Anesthesia:  Spinal using Marcaine  Findings:  Tara Mcmahon is 4198w5d  weeks pregnant with her third pregnancy.  She had a cerclage placed with her last pregnancy at approximately 14weeks due to incompetent history with the birht of a 26 week baby.  After the cerclage her pregnancy went to term Today intraoperatively her cervix is patulous soft mucousy and bleeds easily.  It does appear shortened at least to the level of the bladder reflection.  I was able to get a good 2-2-1/2 cm length circumferentially tied down to a tight fingertip.  Description of operation:  Patient was taken to the operating room where she underwent a spinal anesthetic in the sitting position.  She was then placed in the dorsal lithotomy position using candycane stirrups.  I then prepped and draped in the lower abdomen inner thighs perineum perirectal and vaginal area.  A Foley catheter was placed.  A medium length weighted speculum was placed posteriorly.  2 Deaver retractors were used anteriorly and laterally for retraction and visualization.  A 5 mm Mersilene tape was employed.  It was placed in a counterclockwise fashion at 5 points circumferentially about the cervix.  It was then tied down to a tight fingertip tension at 12:00.  To facilitate future removal a 2-0 silk suture was placed above the knot and tied down.  The Mersilene tape and were then cut.    The patient tolerated the procedure well.  She experienced approximately 0 cc of blood loss.  She was taken to the PACU in good stable condition with all counts being correct.  She received 2 gram of Ancef preoperatively prophylactically. The  [place] to [place] on Saturday and is given instructions for the tript  Nance PewEURE,Sophia Sperry H Ofelia Podolski H Shital Crayton, MD 01/31/2018 1:48 PM

## 2018-01-31 NOTE — Anesthesia Preprocedure Evaluation (Signed)
Anesthesia Evaluation  Patient identified by MRN, date of birth, ID band Patient awake    Reviewed: Allergy & Precautions, NPO status , Patient's Chart, lab work & pertinent test results  Airway Mallampati: II  TM Distance: >3 FB     Dental  (+) Teeth Intact, Dental Advisory Given   Pulmonary neg pulmonary ROS,    breath sounds clear to auscultation       Cardiovascular hypertension, negative cardio ROS   Rhythm:Regular Rate:Normal     Neuro/Psych PSYCHIATRIC DISORDERS Anxiety Depression    GI/Hepatic negative GI ROS,   Endo/Other  Hypothyroidism PCOS  Renal/GU      Musculoskeletal   Abdominal   Peds  Hematology   Anesthesia Other Findings   Reproductive/Obstetrics (+) Pregnancy (IUG at 14 wks )                             Anesthesia Physical Anesthesia Plan  ASA: II  Anesthesia Plan: Spinal   Post-op Pain Management:    Induction:   PONV Risk Score and Plan:   Airway Management Planned: Simple Face Mask  Additional Equipment:   Intra-op Plan:   Post-operative Plan:   Informed Consent: I have reviewed the patients History and Physical, chart, labs and discussed the procedure including the risks, benefits and alternatives for the proposed anesthesia with the patient or authorized representative who has indicated his/her understanding and acceptance.     Plan Discussed with:   Anesthesia Plan Comments:         Anesthesia Quick Evaluation

## 2018-01-31 NOTE — Transfer of Care (Signed)
Immediate Anesthesia Transfer of Care Note  Patient: Tara Mcmahon  Procedure(s) Performed: CERCLAGE CERVICAL (N/A )  Patient Location: PACU  Anesthesia Type:Spinal  Level of Consciousness: awake and patient cooperative  Airway & Oxygen Therapy: Patient Spontanous Breathing and Patient connected to face mask oxygen  Post-op Assessment: Report given to RN and Post -op Vital signs reviewed and stable  Post vital signs: Reviewed and stable  Last Vitals:  Vitals:   01/31/18 1245 01/31/18 1250  BP: 120/66   Pulse: 94   Resp: 18 15  Temp:    SpO2: 97% 99%    Last Pain:  Vitals:   01/31/18 1109  PainSc: 0-No pain         Complications: No apparent anesthesia complications

## 2018-01-31 NOTE — Progress Notes (Signed)
Patient with good movement of LE bilaterally stood beside bed unable to ambulate to chair with some weakness with weight bearing. Returned to Doctor, general practicestretcher.

## 2018-01-31 NOTE — Anesthesia Postprocedure Evaluation (Signed)
Anesthesia Post Note  Patient: Tara Mcmahon  Procedure(s) Performed: CERCLAGE CERVICAL (N/A )  Patient location during evaluation: PACU Anesthesia Type: Spinal Level of consciousness: awake and alert and patient cooperative Pain management: satisfactory to patient Vital Signs Assessment: post-procedure vital signs reviewed and stable Respiratory status: spontaneous breathing Cardiovascular status: stable Postop Assessment: spinal receding Anesthetic complications: no     Last Vitals:  Vitals:   01/31/18 1500 01/31/18 1536  BP: (!) 110/59   Pulse: 76 81  Resp: 20 13  Temp:    SpO2: 99% 98%    Last Pain:  Vitals:   01/31/18 1536  PainSc: 0-No pain    LLE Motor Response: Purposeful movement (01/31/18 1536) LLE Sensation: Decreased (01/31/18 1536) RLE Motor Response: Purposeful movement (01/31/18 1536) RLE Sensation: Decreased (01/31/18 1536)      Minerva AreolaYATES,Iviana Blasingame

## 2018-01-31 NOTE — H&P (Signed)
Preoperative History and Physical  Tara Mcmahon is a 22 y.o. [phone] with Patient's last menstrual period was 10/12/2017 (approximate). admitted for a placement of McDonald cerclage.  Experienced 26 week delivery with her first pregnancy with incompetent type history I placed a cerclage with her last pregnancy and it was successful, removed at [redacted] week gestation.  She underwent induction of labor at 39 weeks for [facility]  Presents to day for repeat elective placement of a McDonald cerclage  PMH:    Past Medical History:  Diagnosis Date  . Anxiety   . Cervical incompetence during pregnancy   . Depression   . Hypothyroidism   . PCOS (polycystic ovarian syndrome)     PSH:     Past Surgical History:  Procedure Laterality Date  . CERVICAL CERCLAGE    . CERVICAL CERCLAGE N/A 03/09/2016   Procedure: [facility] CERCLAGE PLACEMENT;  Surgeon: Lazaro Arms, MD;  Location: [facility];  Service: Gynecology;  Laterality: N/A;  . labial reduction      POb/GynH:      OB History    Gravida Para Term Preterm AB Living   4 2 1 1 1 2    SAB TAB Ectopic Multiple Live Births   1     0 2      SH:   Social History   Tobacco Use  . Smoking status: Never Smoker  . Smokeless tobacco: Never Used  Substance Use Topics  . Alcohol use: No    Frequency: Never    Comment: occ; not now  . Drug use: No    FH:    Family History  Problem Relation Age of Onset  . Arthritis Mother   . Lupus Mother   . Autoimmune disease Mother   . Crohn's disease Father   . Cancer Maternal Grandmother        breast  . Heart disease Maternal Grandfather   . Heart disease Paternal Grandfather   . Hypertension Paternal Grandfather   . Lupus Maternal Aunt      Allergies:  Allergies  Allergen Reactions  . Shellfish Allergy     Only Crawfish not allergic to any other shellfish     Medications:       Current Facility-Administered Medications:  .  ceFAZolin (ANCEF) IVPB 2g/100 mL premix, 2 g, Intravenous, On  Call to OR, Lazaro Arms, MD .  lactated ringers infusion, , Intravenous, Continuous, Laurene Footman, MD, Last Rate: 75 mL/hr at 01/31/18 1122  Review of Systems:   Review of Systems  Constitutional: Negative for fever, chills, weight loss, malaise/fatigue and diaphoresis.  HENT: Negative for hearing loss, ear pain, nosebleeds, congestion, sore throat, neck pain, tinnitus and ear discharge.   Eyes: Negative for blurred vision, double vision, photophobia, pain, discharge and redness.  Respiratory: Negative for cough, hemoptysis, sputum production, shortness of breath, wheezing and stridor.   Cardiovascular: Negative for chest pain, palpitations, orthopnea, claudication, leg swelling and PND.  Gastrointestinal: Positive for abdominal pain. Negative for heartburn, nausea, vomiting, diarrhea, constipation, blood in stool and melena.  Genitourinary: Negative for dysuria, urgency, frequency, hematuria and flank pain.  Musculoskeletal: Negative for myalgias, back pain, joint pain and falls.  Skin: Negative for itching and rash.  Neurological: Negative for dizziness, tingling, tremors, sensory change, speech change, focal weakness, seizures, loss of consciousness, weakness and headaches.  Endo/Heme/Allergies: Negative for environmental allergies and polydipsia. Does not bruise/bleed easily.  Psychiatric/Behavioral: Negative for depression, suicidal ideas, hallucinations, memory loss and substance abuse. The patient is  not nervous/anxious and does not have insomnia.      PHYSICAL EXAM:  Blood pressure 132/82, pulse (!) 104, temperature 98.1 F (36.7 C), last menstrual period 10/12/2017, not currently breastfeeding.    Vitals reviewed. Constitutional: She is oriented to person, place, and time. She appears well-developed and well-nourished.  HENT:  Head: Normocephalic and atraumatic.  Right Ear: External ear normal.  Left Ear: External ear normal.  Nose: Nose normal.  Mouth/Throat:  Oropharynx is clear and moist.  Eyes: Conjunctivae and EOM are normal. Pupils are equal, round, and reactive to light. Right eye exhibits no discharge. Left eye exhibits no discharge. No scleral icterus.  Neck: Normal range of motion. Neck supple. No tracheal deviation present. No thyromegaly present.  Cardiovascular: Normal rate, regular rhythm, normal heart sounds and intact distal pulses.  Exam reveals no gallop and no friction rub.   No murmur heard. Respiratory: Effort normal and breath sounds normal. No respiratory distress. She has no wheezes. She has no rales. She exhibits no tenderness.  GI: Soft. Bowel sounds are normal. She exhibits no distension and no mass. There is tenderness. There is no rebound and no guarding.  Genitourinary:       Vulva is normal without lesions Vagina is pink moist without discharge Cervix normal in appearance and pap is normal Uterus is 15 weeks size Adnexa is negative with normal sized ovaries by sonogram  Musculoskeletal: Normal range of motion. She exhibits no edema and no tenderness.  Neurological: She is alert and oriented to person, place, and time. She has normal reflexes. She displays normal reflexes. No cranial nerve deficit. She exhibits normal muscle tone. Coordination normal.  Skin: Skin is warm and dry. No rash noted. No erythema. No pallor.  Psychiatric: She has a normal mood and affect. Her behavior is normal. Judgment and thought content normal.    Labs: Results for orders placed or performed during the hospital encounter of 01/26/18 (from the past 336 hour(s))  Urinalysis, Routine w reflex microscopic   Collection Time: 01/26/18  9:30 PM  Result Value Ref Range   Color, Urine STRAW (A) YELLOW   APPearance HAZY (A) CLEAR   Specific Gravity, Urine 1.005 1.005 - 1.030   pH 8.0 5.0 - 8.0   Glucose, UA NEGATIVE NEGATIVE mg/dL   Hgb urine dipstick NEGATIVE NEGATIVE   Bilirubin Urine NEGATIVE NEGATIVE   Ketones, ur NEGATIVE NEGATIVE mg/dL    Protein, ur NEGATIVE NEGATIVE mg/dL   Nitrite NEGATIVE NEGATIVE   Leukocytes, UA NEGATIVE NEGATIVE  Results for orders placed or performed during the hospital encounter of 01/26/18 (from the past 336 hour(s))  Basic metabolic panel   Collection Time: 01/26/18 10:30 AM  Result Value Ref Range   Sodium 135 135 - 145 mmol/L   Potassium 3.6 3.5 - 5.1 mmol/L   Chloride 102 101 - 111 mmol/L   CO2 21 (L) 22 - 32 mmol/L   Glucose, Bld 83 65 - 99 mg/dL   BUN 7 6 - 20 mg/dL   Creatinine, Ser 4.090.55 0.44 - 1.00 mg/dL   Calcium 9.1 8.9 - 81.110.3 mg/dL   GFR calc non Af Amer >60 >60 mL/min   GFR calc Af Amer >60 >60 mL/min   Anion gap 12 5 - 15  CBC   Collection Time: 01/26/18 10:30 AM  Result Value Ref Range   WBC 11.9 (H) 4.0 - 10.5 K/uL   RBC 4.23 3.87 - 5.11 MIL/uL   Hemoglobin 12.2 12.0 - 15.0 g/dL  HCT 35.8 (L) 36.0 - 46.0 %   MCV 84.6 78.0 - 100.0 fL   MCH 28.8 26.0 - 34.0 pg   MCHC 34.1 30.0 - 36.0 g/dL   RDW 16.1 09.6 - 04.5 %   Platelets 344 150 - 400 K/uL  Urinalysis, Routine w reflex microscopic   Collection Time: 01/26/18 10:30 AM  Result Value Ref Range   Color, Urine YELLOW YELLOW   APPearance CLEAR CLEAR   Specific Gravity, Urine 1.019 1.005 - 1.030   pH 6.0 5.0 - 8.0   Glucose, UA NEGATIVE NEGATIVE mg/dL   Hgb urine dipstick NEGATIVE NEGATIVE   Bilirubin Urine NEGATIVE NEGATIVE   Ketones, ur NEGATIVE NEGATIVE mg/dL   Protein, ur NEGATIVE NEGATIVE mg/dL   Nitrite NEGATIVE NEGATIVE   Leukocytes, UA NEGATIVE NEGATIVE  Results for orders placed or performed in visit on 01/19/18 (from the past 336 hour(s))  POCT urinalysis dipstick   Collection Time: 01/19/18  1:18 PM  Result Value Ref Range   Color, UA     Clarity, UA     Glucose, UA neg    Bilirubin, UA     Ketones, UA neg    Spec Grav, UA  1.010 - 1.025   Blood, UA neg    pH, UA  5.0 - 8.0   Protein, UA trace    Urobilinogen, UA  0.2 or 1.0 E.U./dL   Nitrite, UA neg    Leukocytes, UA Negative Negative    Appearance     Odor    Integrated 1   Collection Time: 01/19/18  1:52 PM  Result Value Ref Range   Results Report    Test Results: Comment    Submit Part 2 Sample Using Date:    Crown Rump Length 70.9 mm   CRL Scan Date:    Sonographer ID# [id number]    Gest. Age on Collection Date 13.1 weeks   [age]D [age]   Race Caucasian    Weight 199 lbs   Number of Fetuses 1    Nuchal Translucency (NT) 2.1 mm   PAPP-A Value 690.6 ng/mL   Comments: Comment    Note: Comment     EKG: Orders placed or performed during the hospital encounter of 10/22/17  . EKG 12-Lead  . EKG 12-Lead  . ED EKG within 10 minutes  . ED EKG within 10 minutes  . EKG    Imaging Studies: US Fetal Nuchal Translucency Measurement  Result Date: 01/19/2018 NUCHAL TRANSLUCENCY FOR INTEGRATED TESTING Tara Mcmahon is in the office for nuchal translucency sonogram as part of an integrated screen. She is a 22 y.o. year old [phone] with Estimated Date of Delivery: 07/27/18 by early ultrasound now at  [redacted]w[redacted]d weeks gestation. Thus far the pregnancy has been complicated by GHTN,hx of cx incompetence,preterm delivery. GESTATION: SINGLETON FETAL ACTIVITY:          Heart rate         141          The fetus is active. AMNIOTIC FLUID: The amniotic fluid volume is  normal PLACENTA LOCALIZATION:  posterior GRADE 0 CERVIX: Measures 5.1 cm ADNEXA: The ovaries are normal. GESTATIONAL AGE AND  BIOMETRICS: Gestational criteria: Estimated Date of Delivery: 07/27/18 by [place] now at [redacted]w[redacted]d Previous Scans:1    CROWN RUMP LENGTH           70.89 mm         13+1 weeks NUCHAL TRANSLUCENCY  2.1 mm         normal                                                                   AVERAGE EGA(BY THIS SCAN):  13+1 weeks The fetal nasal bone is identified.  TECHNICIAN COMMENTS: [place] 13 wks,measurements c/w dates,normal ovaries bilat,fhr 141 bpm,crl 70.89 mm,NB present,NT 2.1 mm The patient will have the first blood draw of her integrated screening  today and the second draw in approximately 4 weeks. [name] 01/19/2018 1:12 PM Clinical Impression and recommendations: I have reviewed the sonogram results above, combined with the patient's current clinical course, below are my impressions and any appropriate recommendations for management based on the sonographic findings. 1.  [id number] Estimated Date of Delivery: 09/13/17 by  early ultrasound and confirmed by today's sonographic dating 2.  Normal fetal sonographic findings, specifically normal nuchal translucency and present fetal nasal bone Additionally the cranium, both choroid plexuses, zygomatic arch, mandible, 4 equal extremities, stomach, bladder and anterior abdomen are all noted. 3.  Normal general sonographic findings Recommend routine prenatal care based on this sonogram or as clinically indicated Lazaro Arms 01/19/2018 1:18 PM                                                     Assessment: [id number]  [redacted]w[redacted]d Estimated Date of Delivery: 07/27/18  Incompetent cervix, s/p cerclage with her last prenancy   Patient Active Problem List   Diagnosis Date Noted  . Rubella non-immune status, antepartum 12/14/2017  . Supervision of normal pregnancy 12/13/2017  . Abdominal cramps 11/27/2017  . Grief reaction 11/27/2017  . History of cervical incompetence in pregnancy, currently pregnant 11/23/2017  . History of preterm delivery, currently pregnant 11/23/2017  . History of PCOS 06/23/2017  . Dysmenorrhea 06/23/2017  . Menorrhagia with irregular cycle 06/23/2017  . Fever 09/11/2016  . Breast pain 09/11/2016  . Gestational hypertension 08/29/2016  . Subclinical hypothyroidism 02/04/2016  . History of suicidal ideation 02/03/2016  . PCOS (polycystic ovarian syndrome) 01/27/2016  . Depression with anxiety 01/27/2016  . Family history of systemic lupus erythematosus (SLE) in mother 01/27/2016    Plan: Placement of McDonald cerclage  Lazaro Arms 01/31/2018 12:16 PM

## 2018-02-01 ENCOUNTER — Encounter (HOSPITAL_COMMUNITY): Payer: Self-pay | Admitting: Obstetrics & Gynecology

## 2018-03-03 LAB — THYROID PANEL WITH TSH
FREE THYROXINE INDEX: 1.5 (ref 1.2–4.9)
T3 UPTAKE RATIO: 14 % — AB (ref 24–39)
T4, Total: 10.8 ug/dL (ref 4.5–12.0)
TSH: 2.06 u[IU]/mL (ref 0.450–4.500)

## 2018-03-06 ENCOUNTER — Telehealth: Payer: Self-pay | Admitting: *Deleted

## 2018-03-06 NOTE — Telephone Encounter (Signed)
Spoke with pt letting her know results were released to my chart and pt has already reviewed them. She will let ob doc know at her appt tomorrow. JSY

## 2018-03-06 NOTE — Telephone Encounter (Signed)
[facility] them [facility]

## 2018-06-08 ENCOUNTER — Other Ambulatory Visit: Payer: Self-pay

## 2018-06-08 ENCOUNTER — Ambulatory Visit (INDEPENDENT_AMBULATORY_CARE_PROVIDER_SITE_OTHER): Payer: Medicaid Other | Admitting: Obstetrics & Gynecology

## 2018-06-08 ENCOUNTER — Encounter: Payer: Self-pay | Admitting: Obstetrics & Gynecology

## 2018-06-08 VITALS — BP 118/79 | HR 90 | Wt 204.0 lb

## 2018-06-08 DIAGNOSIS — O3430 Maternal care for cervical incompetence, unspecified trimester: Secondary | ICD-10-CM

## 2018-06-08 DIAGNOSIS — O3433 Maternal care for cervical incompetence, third trimester: Secondary | ICD-10-CM

## 2018-06-08 DIAGNOSIS — Z3A33 33 weeks gestation of pregnancy: Secondary | ICD-10-CM

## 2018-06-08 DIAGNOSIS — Z3483 Encounter for supervision of other normal pregnancy, third trimester: Secondary | ICD-10-CM

## 2018-06-08 DIAGNOSIS — Z331 Pregnant state, incidental: Secondary | ICD-10-CM

## 2018-06-08 DIAGNOSIS — Z1389 Encounter for screening for other disorder: Secondary | ICD-10-CM

## 2018-06-08 LAB — POCT URINALYSIS DIPSTICK
Blood, UA: NEGATIVE
GLUCOSE UA: NEGATIVE
Ketones, UA: NEGATIVE
LEUKOCYTES UA: NEGATIVE
Nitrite, UA: NEGATIVE
Protein, UA: NEGATIVE

## 2018-06-08 NOTE — Progress Notes (Signed)
[id number] Estimated Date of Delivery: 07/27/18  Blood pressure 118/79, pulse 90, weight 204 lb (92.5 kg), last menstrual period 10/12/2017, not currently breastfeeding.   BP weight and urine results all reviewed and noted.  Please refer to the obstetrical flow sheet for the fundal height and fetal heart rate documentation:  Patient reports good fetal movement, denies any bleeding and no rupture of membranes symptoms or regular contractions. Patient is without complaints. All questions were answered.  Orders Placed This Encounter  Procedures  . POCT urinalysis dipstick    Plan:  Continued routine obstetrical care, s/p cerclage, no problems with the pregnancy, [place] problems at all down there as well  Return in about 2 weeks (around 06/22/2018) for LROB.

## 2018-06-14 ENCOUNTER — Inpatient Hospital Stay (HOSPITAL_COMMUNITY)
Admission: AD | Admit: 2018-06-14 | Discharge: 2018-06-14 | Disposition: A | Discharge status: Home / Self Care | Payer: Medicaid Other | Source: Ambulatory Visit | Attending: Obstetrics & Gynecology | Admitting: Obstetrics & Gynecology

## 2018-06-14 ENCOUNTER — Other Ambulatory Visit: Payer: Self-pay

## 2018-06-14 ENCOUNTER — Encounter (HOSPITAL_COMMUNITY): Payer: Self-pay

## 2018-06-14 DIAGNOSIS — N898 Other specified noninflammatory disorders of vagina: Secondary | ICD-10-CM | POA: Diagnosis not present

## 2018-06-14 DIAGNOSIS — O26893 Other specified pregnancy related conditions, third trimester: Secondary | ICD-10-CM | POA: Insufficient documentation

## 2018-06-14 DIAGNOSIS — O479 False labor, unspecified: Secondary | ICD-10-CM

## 2018-06-14 DIAGNOSIS — O4703 False labor before 37 completed weeks of gestation, third trimester: Secondary | ICD-10-CM | POA: Diagnosis not present

## 2018-06-14 DIAGNOSIS — Z3A33 33 weeks gestation of pregnancy: Secondary | ICD-10-CM

## 2018-06-14 DIAGNOSIS — Z3689 Encounter for other specified antenatal screening: Secondary | ICD-10-CM

## 2018-06-14 LAB — WET PREP, GENITAL
CLUE CELLS WET PREP: NONE SEEN
Sperm: NONE SEEN
Trich, Wet Prep: NONE SEEN
Yeast Wet Prep HPF POC: NONE SEEN

## 2018-06-14 LAB — URINALYSIS, ROUTINE W REFLEX MICROSCOPIC
BILIRUBIN URINE: NEGATIVE
Glucose, UA: 50 mg/dL — AB
HGB URINE DIPSTICK: NEGATIVE
Ketones, ur: 20 mg/dL — AB
NITRITE: NEGATIVE
PROTEIN: NEGATIVE mg/dL
Specific Gravity, Urine: 1.018 (ref 1.005–1.030)
pH: 6 (ref 5.0–8.0)

## 2018-06-14 NOTE — Discharge Instructions (Signed)
Braxton Hicks Contractions °Contractions of the uterus can occur throughout pregnancy, but they are not always a sign that you are in labor. You may have practice contractions called Braxton Hicks contractions. These false labor contractions are sometimes confused with true labor. °What are Braxton Hicks contractions? °Braxton Hicks contractions are tightening movements that occur in the muscles of the uterus before labor. Unlike true labor contractions, these contractions do not result in opening (dilation) and thinning of the cervix. Toward the end of pregnancy (32-34 weeks), Braxton Hicks contractions can happen more often and may become stronger. These contractions are sometimes difficult to tell apart from true labor because they can be very uncomfortable. You should not feel embarrassed if you go to the hospital with false labor. °Sometimes, the only way to tell if you are in true labor is for your health care provider to look for changes in the cervix. The health care provider will do a physical exam and may monitor your contractions. If you are not in true labor, the exam should show that your cervix is not dilating and your water has not broken. °If there are other health problems associated with your pregnancy, it is completely safe for you to be sent home with false labor. You may continue to have Braxton Hicks contractions until you go into true labor. °How to tell the difference between true labor and false labor °True labor °· Contractions last 30-70 seconds. °· Contractions become very regular. °· Discomfort is usually felt in the top of the uterus, and it spreads to the lower abdomen and low back. °· Contractions do not go away with walking. °· Contractions usually become more intense and increase in frequency. °· The cervix dilates and gets thinner. °False labor °· Contractions are usually shorter and not as strong as true labor contractions. °· Contractions are usually irregular. °· Contractions  are often felt in the front of the lower abdomen and in the groin. °· Contractions may go away when you walk around or change positions while lying down. °· Contractions get weaker and are shorter-lasting as time goes on. °· The cervix usually does not dilate or become thin. °Follow these instructions at home: °· Take over-the-counter and prescription medicines only as told by your health care provider. °· Keep up with your usual exercises and follow other instructions from your health care provider. °· Eat and drink lightly if you think you are going into labor. °· If Braxton Hicks contractions are making you uncomfortable: °? Change your position from lying down or resting to walking, or change from walking to resting. °? Sit and rest in a tub of warm water. °? Drink enough fluid to keep your urine pale yellow. Dehydration may cause these contractions. °? Do slow and deep breathing several times an hour. °· Keep all follow-up prenatal visits as told by your health care provider. This is important. °Contact a health care provider if: °· You have a fever. °· You have continuous pain in your abdomen. °Get help right away if: °· Your contractions become stronger, more regular, and closer together. °· You have fluid leaking or gushing from your vagina. °· You pass blood-tinged mucus (bloody show). °· You have bleeding from your vagina. °· You have low back pain that you never had before. °· You feel your baby’s head pushing down and causing pelvic pressure. °· Your baby is not moving inside you as much as it used to. °Summary °· Contractions that occur before labor are called Braxton   Hicks contractions, false labor, or practice contractions. °· Braxton Hicks contractions are usually shorter, weaker, farther apart, and less regular than true labor contractions. True labor contractions usually become progressively stronger and regular and they become more frequent. °· Manage discomfort from Braxton Hicks contractions by  changing position, resting in a warm bath, drinking plenty of water, or practicing deep breathing. °This information is not intended to replace advice given to you by your health care provider. Make sure you discuss any questions you have with your health care provider. °Document Released: 03/30/2017 Document Revised: 03/30/2017 Document Reviewed: 03/30/2017 °Elsevier Interactive Patient Education © 2018 [employer] ° °

## 2018-06-14 NOTE — MAU Provider Note (Signed)
History     CSN: [id number]  Arrival date and time: 06/14/18 4098   First Provider Initiated Contact with Patient 06/14/18 1919      Chief Complaint  Patient presents with  . Contractions  . Vaginal Discharge   [id number] @33 .6 wks here with increased vaginal discharge and ctx. Ctx have been q15 min intermittently over the last 2 days but now are q30-60 min. Vaginal discharge has been ongoing x3 weeks but has increased in amount. Describes as thick and yellow w/o odor or itching. She is have some external vaginal irritation. Endorses dysuria but no other urinary sx and doesn't think she has a UTI. No VB or LOF. Nothing in the vagina recently. Reports good FM.   OB History    Gravida  4   Para  2   Term  1   Preterm  1   AB  1   Living  2     SAB  1   TAB      Ectopic      Multiple  0   Live Births  2           Past Medical History:  Diagnosis Date  . Anxiety   . Cervical incompetence during pregnancy   . Depression   . Hypothyroidism   . PCOS (polycystic ovarian syndrome)     Past Surgical History:  Procedure Laterality Date  . CERVICAL CERCLAGE    . CERVICAL CERCLAGE N/A 03/09/2016   Procedure: [facility] CERCLAGE PLACEMENT;  Surgeon: Lazaro Arms, MD;  Location: [facility];  Service: Gynecology;  Laterality: N/A;  . CERVICAL CERCLAGE N/A 01/31/2018   Procedure: CERCLAGE CERVICAL;  Surgeon: Lazaro Arms, MD;  Location: [facility];  Service: Gynecology;  Laterality: N/A;  . labial reduction      Family History  Problem Relation Age of Onset  . Arthritis Mother   . Lupus Mother   . Autoimmune disease Mother   . Crohn's disease Father   . Cancer Maternal Grandmother        breast  . Heart disease Maternal Grandfather   . Heart disease Paternal Grandfather   . Hypertension Paternal Grandfather   . Lupus Maternal Aunt     Social History   Tobacco Use  . Smoking status: Never Smoker  . Smokeless tobacco: Never Used  Substance Use Topics  . Alcohol  use: No    Frequency: Never    Comment: occ; not now  . Drug use: No    Allergies:  Allergies  Allergen Reactions  . Shellfish Allergy     Only Crawfish not allergic to any other shellfish     Medications Prior to Admission  Medication Sig Dispense Refill Last Dose  . aspirin EC 81 MG tablet Take 162 mg by mouth daily.   Taking  . Prenatal MV & Min w/FA-DHA (PRENATAL ADULT GUMMY/DHA/FA PO) Take 2 tablets by mouth daily.   Taking    Review of Systems  Gastrointestinal: Positive for abdominal pain.  Genitourinary: Positive for dysuria and vaginal discharge. Negative for frequency, hematuria and urgency.   Physical Exam   Blood pressure 125/75, pulse (!) 114, temperature 98.1 F (36.7 C), resp. rate 20, weight 206 lb (93.4 kg), last menstrual period 10/12/2017, SpO2 97 %, not currently breastfeeding.  Physical Exam  Constitutional: She is oriented to person, place, and time. She appears well-developed and well-nourished. No distress.  HENT:  Head: Normocephalic and atraumatic.  Neck: Normal range of motion.  Respiratory:  Effort normal. No respiratory distress.  GI: Soft. She exhibits no distension. There is no tenderness.  gravid  Genitourinary:  Genitourinary Comments: External: no lesions or erythema Vagina: rugated, pink, moist, moderate thick yellow discharge, cerclage visualized and intact, visually closed Cervix closed/thick   Musculoskeletal: Normal range of motion.  Neurological: She is alert and oriented to person, place, and time.  Skin: Skin is warm and dry.  Psychiatric: She has a normal mood and affect.  EFM: 155 bpm, mod variability, + accels, no decels Toco: none  Results for orders placed or performed during the hospital encounter of 06/14/18 (from the past 24 hour(s))  Urinalysis, Routine w reflex microscopic     Status: Abnormal   Collection Time: 06/14/18  7:21 PM  Result Value Ref Range   Color, Urine YELLOW YELLOW   APPearance HAZY (A) CLEAR    Specific Gravity, Urine 1.018 1.005 - 1.030   pH 6.0 5.0 - 8.0   Glucose, UA 50 (A) NEGATIVE mg/dL   Hgb urine dipstick NEGATIVE NEGATIVE   Bilirubin Urine NEGATIVE NEGATIVE   Ketones, ur 20 (A) NEGATIVE mg/dL   Protein, ur NEGATIVE NEGATIVE mg/dL   Nitrite NEGATIVE NEGATIVE   Leukocytes, UA TRACE (A) NEGATIVE   RBC / HPF 0-5 0 - 5 RBC/hpf   WBC, UA 0-5 0 - 5 WBC/hpf   Bacteria, UA RARE (A) NONE SEEN   Squamous Epithelial / LPF 0-5 0 - 5   Mucus PRESENT   Wet prep, genital     Status: Abnormal   Collection Time: 06/14/18  7:29 PM  Result Value Ref Range   Yeast Wet Prep HPF POC NONE SEEN NONE SEEN   Trich, Wet Prep NONE SEEN NONE SEEN   Clue Cells Wet Prep HPF POC NONE SEEN NONE SEEN   WBC, Wet Prep HPF POC MANY (A) NONE SEEN   Sperm NONE SEEN     MAU Course  Procedures  MDM Labs ordered and reviewed. No evidence of UTI, vaginal infection, or PTL. Stable for discharge home.  Assessment and Plan   1. [redacted] weeks gestation of pregnancy   2. NST (non-stress test) reactive   3. Vaginal discharge during pregnancy in third trimester   4. Braxton Hick's contraction    Discharge home Follow up at [facility] as scheduled PTL precautions Pelvic rest  Allergies as of 06/14/2018      Reactions   Shellfish Allergy    Only Crawfish not allergic to any other shellfish       Medication List    TAKE these medications   aspirin EC 81 MG tablet Take 162 mg by mouth daily.   PRENATAL ADULT GUMMY/DHA/FA PO Take 2 tablets by mouth daily.      Donette LarryMelanie Darline Faith, CNM 06/14/2018, 7:57 PM

## 2018-06-14 NOTE — MAU Note (Signed)
Pt states that she started noticing ctx's on Tuesday. Pt reports that they are not painful.   Pt reports thick yellow discharge that started last Monday.  She reports that she has a cerclage.

## 2018-06-15 LAB — GC/CHLAMYDIA PROBE AMP (~~LOC~~) NOT AT ARMC
Chlamydia: NEGATIVE
NEISSERIA GONORRHEA: NEGATIVE

## 2018-06-22 ENCOUNTER — Encounter: Payer: Self-pay | Admitting: Obstetrics and Gynecology

## 2018-06-22 ENCOUNTER — Ambulatory Visit (INDEPENDENT_AMBULATORY_CARE_PROVIDER_SITE_OTHER): Payer: Medicaid Other | Admitting: Obstetrics and Gynecology

## 2018-06-22 VITALS — BP 131/75 | HR 102 | Wt 205.8 lb

## 2018-06-22 DIAGNOSIS — Z3483 Encounter for supervision of other normal pregnancy, third trimester: Secondary | ICD-10-CM

## 2018-06-22 DIAGNOSIS — O26892 Other specified pregnancy related conditions, second trimester: Secondary | ICD-10-CM

## 2018-06-22 DIAGNOSIS — Z1389 Encounter for screening for other disorder: Secondary | ICD-10-CM

## 2018-06-22 DIAGNOSIS — Z331 Pregnant state, incidental: Secondary | ICD-10-CM

## 2018-06-22 DIAGNOSIS — L299 Pruritus, unspecified: Secondary | ICD-10-CM

## 2018-06-22 DIAGNOSIS — Z3A35 35 weeks gestation of pregnancy: Secondary | ICD-10-CM

## 2018-06-22 LAB — POCT URINALYSIS DIPSTICK OB
GLUCOSE, UA: NEGATIVE — AB
LEUKOCYTES UA: NEGATIVE
NITRITE UA: NEGATIVE
PROTEIN: NEGATIVE
RBC UA: NEGATIVE

## 2018-06-22 MED ORDER — DIPHENHYDRAMINE HCL 25 MG PO TABS: 25.0000 mg | ORAL_TABLET | Freq: Every evening | ORAL | 1 refills | Status: DC | PRN

## 2018-06-22 NOTE — Progress Notes (Signed)
Patient ID: Tara PealAshleigh A Mcmahon, female   DOB: January 02, 1996, 22 y.o.   MRN: [id number]   LOW-RISK PREGNANCY VISIT Patient name: Tara Mcmahon MRN [id number]  Date of birth: January 02, 1996 Chief Complaint:   low risk ob  History of Present Illness:   Tara Mcmahon is a 22 y.o. [id number] female at 8271w0d with an Estimated Date of Delivery: 07/27/18 being seen today for ongoing management of pregnancy complicated by Hx of cerclage placement, still in place. Today she reports contractions since Sunday, 06/17/2018. In the waiting room the contractions were about 6 minutes apart x 3-4, but now quit.. The patient is concerned about the contractions because of the intensity.  No spotting bleeding or gush of fluidAbout 20 minutes prior to 1:52 PM was her last contraction. She also experienced some very lite spotting recently. The patient adds her abdomen has been itchy.with redness in the stretch marks.   Contractions: Irregular.  .  Movement: Present. denies leaking of fluid. Review of Systems:   Pertinent items are noted in HPI Denies abnormal vaginal discharge w/ itching/odor/irritation, headaches, visual changes, shortness of breath, chest pain, abdominal pain, severe nausea/vomiting, or problems with urination or bowel movements unless otherwise stated above. Pertinent History Reviewed:  Reviewed past medical,surgical, social, obstetrical and family history.  Reviewed problem list, medications and allergies. Physical Assessment:   Vitals:   06/22/18 1337  BP: 131/75  Pulse: (!) 102  Weight: 205 lb 12.8 oz (93.4 kg)  Body mass index is 41.57 kg/m.        Physical Examination:   General appearance: Well appearing, and in no distress  Mental status: Alert, oriented to person, place, and time  Skin: Warm & dry  Cardiovascular: Normal heart rate noted  Respiratory: Normal respiratory effort, no distress  Abdomen: Soft, gravid, nontender  Pelvic: Cervical exam performed  Dilation: Closed Effacement  (%): Thick Station: Ballotable   Extremities: Edema: None  Fetal Status: Fetal Heart Rate (bpm): 126 Fundal Height: 36 cm Movement: Present    Results for orders placed or performed in visit on 06/22/18 (from the past 24 hour(s))  POC Urinalysis Dipstick OB   Collection Time: 06/22/18  1:42 PM  Result Value Ref Range   Color, UA     Clarity, UA     Glucose, UA Negative (A) (none)   Bilirubin, UA     Ketones, UA small    Spec Grav, UA  1.010 - 1.025   Blood, UA neg    pH, UA  5.0 - 8.0   POC Protein UA Negative Negative, Trace   Urobilinogen, UA  0.2 or 1.0 E.U./dL   Nitrite, UA neg    Leukocytes, UA Negative Negative   Appearance     Odor      Assessment & Plan:  1) Low-risk pregnancy [id number] at 2171w0d with an Estimated Date of Delivery: 07/27/18     Meds: No orders of the defined types were placed in this encounter.  Plan:  Continue routine obstetrical care, continue monitoring contractions. If they continue every 6 minutes for an hour [facility]  2. To check bile acids./bile salts.  Follow-up: Return in about 1 week (around 06/29/2018) for LROB, Group B, GC/Chlamydia .  Orders Placed This Encounter  Procedures  . POC Urinalysis Dipstick OB   bile salts ordered. By signing my name below, I, Diona BrownerJennifer Gorman, attest that this documentation has been prepared under the direction and in the presence of Tilda BurrowFerguson, Minaal Struckman V, MD.  Electronically Signed: Diona Browner, Medical Scribe. 06/22/18. 1:57 PM.  I personally performed the services described in this documentation, which was SCRIBED in my presence. The recorded information has been reviewed and considered accurate. It has been edited as necessary during review. Tilda Burrow, MD

## 2018-06-23 DIAGNOSIS — L299 Pruritus, unspecified: Secondary | ICD-10-CM | POA: Diagnosis not present

## 2018-06-24 ENCOUNTER — Other Ambulatory Visit: Payer: Self-pay

## 2018-06-24 ENCOUNTER — Encounter (HOSPITAL_COMMUNITY): Payer: Self-pay

## 2018-06-24 ENCOUNTER — Inpatient Hospital Stay (HOSPITAL_COMMUNITY)
Admission: AD | Admit: 2018-06-24 | Discharge: 2018-06-25 | Disposition: A | Discharge status: Home / Self Care | Payer: Medicaid Other | Source: Ambulatory Visit | Attending: Obstetrics & Gynecology | Admitting: Obstetrics & Gynecology

## 2018-06-24 DIAGNOSIS — Z7982 Long term (current) use of aspirin: Secondary | ICD-10-CM | POA: Diagnosis not present

## 2018-06-24 DIAGNOSIS — O4703 False labor before 37 completed weeks of gestation, third trimester: Secondary | ICD-10-CM

## 2018-06-24 DIAGNOSIS — O09899 Supervision of other high risk pregnancies, unspecified trimester: Secondary | ICD-10-CM

## 2018-06-24 DIAGNOSIS — O4700 False labor before 37 completed weeks of gestation, unspecified trimester: Secondary | ICD-10-CM | POA: Insufficient documentation

## 2018-06-24 DIAGNOSIS — O3433 Maternal care for cervical incompetence, third trimester: Secondary | ICD-10-CM | POA: Diagnosis not present

## 2018-06-24 DIAGNOSIS — O09299 Supervision of pregnancy with other poor reproductive or obstetric history, unspecified trimester: Secondary | ICD-10-CM

## 2018-06-24 DIAGNOSIS — Z8379 Family history of other diseases of the digestive system: Secondary | ICD-10-CM | POA: Diagnosis not present

## 2018-06-24 DIAGNOSIS — O368131 Decreased fetal movements, third trimester, fetus 1: Secondary | ICD-10-CM

## 2018-06-24 DIAGNOSIS — Z91013 Allergy to seafood: Secondary | ICD-10-CM | POA: Insufficient documentation

## 2018-06-24 DIAGNOSIS — Z283 Underimmunization status: Secondary | ICD-10-CM

## 2018-06-24 DIAGNOSIS — O368130 Decreased fetal movements, third trimester, not applicable or unspecified: Secondary | ICD-10-CM | POA: Diagnosis not present

## 2018-06-24 DIAGNOSIS — O9989 Other specified diseases and conditions complicating pregnancy, childbirth and the puerperium: Secondary | ICD-10-CM

## 2018-06-24 DIAGNOSIS — Z3689 Encounter for other specified antenatal screening: Secondary | ICD-10-CM

## 2018-06-24 DIAGNOSIS — Z3A35 35 weeks gestation of pregnancy: Secondary | ICD-10-CM

## 2018-06-24 DIAGNOSIS — Z8261 Family history of arthritis: Secondary | ICD-10-CM | POA: Diagnosis not present

## 2018-06-24 DIAGNOSIS — Z3483 Encounter for supervision of other normal pregnancy, third trimester: Secondary | ICD-10-CM

## 2018-06-24 LAB — WET PREP, GENITAL
CLUE CELLS WET PREP: NONE SEEN
SPERM: NONE SEEN
TRICH WET PREP: NONE SEEN
Yeast Wet Prep HPF POC: NONE SEEN

## 2018-06-24 MED ORDER — NIFEDIPINE 10 MG PO CAPS
10.0000 mg | ORAL_CAPSULE | ORAL | Status: AC | PRN
  Administered 2018-06-24 – 2018-06-25 (×3): 10 mg via ORAL
  Filled 2018-06-24 (×3): qty 1

## 2018-06-24 NOTE — MAU Note (Signed)
Pt has felt decrease FM all day. + FM on way to hospital

## 2018-06-24 NOTE — MAU Note (Signed)
States felt no movement all day. States after eating, drinking juice, and laying down and still did not feel baby move. Felt baby move after arriving here. States she has felt some ctx rated at 4/10. Denies bleeding and states more discharge than normal. Has thick creamy discharge that started in the past 2 days.

## 2018-06-24 NOTE — MAU Provider Note (Signed)
Chief Complaint:  Decreased Fetal Movement   First Provider Initiated Contact with Patient 06/24/18 2305      HPI: Tara Mcmahon is a 22 y.o. [id number] at [redacted]w[redacted]d who presents to maternity admissions reporting decreased fetal movement all day today and onset of painful contractions while in MAU.  The contractions are irregular but painful and becoming closer together, every 6-8 minutes. They are low in her abdomen and radiate to her low back. She reports that with her last pregnancy, she was contracting so her cerclage was removed at 35 weeks and she felt much better. She was later induced at 39 week with that pregnancy. She is uncomfortable and worried that she lives more than 30 minutes away and might labor and tear her cervix with the cerclage.  She reports some increase in creamy white discharge but there are no other associated symptoms.  She has not tried any treatments. She reports good fetal movement since arriving in MAU, denies LOF, vaginal bleeding, vaginal itching/burning, urinary symptoms, h/a, dizziness, n/v, or fever/chills.    HPI  Past Medical History: Past Medical History:  Diagnosis Date  . Anxiety   . Cervical incompetence during pregnancy   . Depression   . Hypothyroidism   . PCOS (polycystic ovarian syndrome)     Past obstetric history: OB History  Gravida Para Term Preterm AB Living  4 2 1 1 1 2   SAB TAB Ectopic Multiple Live Births  1     0 2    # Outcome Date GA Lbr Len/2nd Weight Sex Delivery Anes PTL Lv  4 Current           3 Term 08/30/16 [redacted]w[redacted]d 07:05 / 03:13 5 lb 15.1 oz (2.695 kg) F Vag-Spont EPI Y LIV     Complications: History of cervical cerclage  2 Preterm 09/19/12 [redacted]w[redacted]d  1 lb 13 oz (0.822 kg) F Vag-Spont None Y LIV     Complications: Cervical incompetence, History of preterm premature rupture of membranes (PPROM)  1 SAB 2013            Past Surgical History: Past Surgical History:  Procedure Laterality Date  . CERVICAL CERCLAGE    . CERVICAL  CERCLAGE N/A 03/09/2016   Procedure: [facility] CERCLAGE PLACEMENT;  Surgeon: Lazaro Arms, MD;  Location: [facility];  Service: Gynecology;  Laterality: N/A;  . CERVICAL CERCLAGE N/A 01/31/2018   Procedure: CERCLAGE CERVICAL;  Surgeon: Lazaro Arms, MD;  Location: [facility];  Service: Gynecology;  Laterality: N/A;  . labial reduction      Family History: Family History  Problem Relation Age of Onset  . Arthritis Mother   . Lupus Mother   . Autoimmune disease Mother   . Crohn's disease Father   . Cancer Maternal Grandmother        breast  . Heart disease Maternal Grandfather   . Heart disease Paternal Grandfather   . Hypertension Paternal Grandfather   . Lupus Maternal Aunt     Social History: Social History   Tobacco Use  . Smoking status: Never Smoker  . Smokeless tobacco: Never Used  Substance Use Topics  . Alcohol use: No    Frequency: Never    Comment: occ; not now  . Drug use: No    Allergies:  Allergies  Allergen Reactions  . Shellfish Allergy     Only Crawfish not allergic to any other shellfish     Meds:  Medications Prior to Admission  Medication Sig Dispense Refill Last  Dose  . aspirin EC 81 MG tablet Take 162 mg by mouth daily.   Past Week at Unknown time  . Prenatal MV & Min w/FA-DHA (PRENATAL ADULT GUMMY/DHA/FA PO) Take 2 tablets by mouth daily.   06/23/2018 at Unknown time  . diphenhydrAMINE (BENADRYL) 25 MG tablet Take 1 tablet (25 mg total) by mouth at bedtime as needed for itching. 15 tablet 1     ROS:  Review of Systems  Constitutional: Negative for chills, fatigue and fever.  Eyes: Negative for visual disturbance.  Respiratory: Negative for shortness of breath.   Cardiovascular: Negative for chest pain.  Gastrointestinal: Positive for abdominal pain. Negative for nausea and vomiting.  Genitourinary: Positive for pelvic pain and vaginal discharge. Negative for difficulty urinating, dysuria, flank pain, vaginal bleeding and vaginal pain.   Musculoskeletal: Positive for back pain.  Neurological: Negative for dizziness and headaches.  Psychiatric/Behavioral: Negative.      I have reviewed patient's Past Medical Hx, Surgical Hx, Family Hx, Social Hx, medications and allergies.   Physical Exam   Patient Vitals for the past 24 hrs:  BP Temp Pulse Resp SpO2 Weight  06/25/18 0021 127/73 - 100 - - -  06/25/18 0004 130/75 - - - - -  06/24/18 2344 120/73 - - - - -  06/24/18 2216 - - - - 100 % -  06/24/18 2214 133/81 98.3 F (36.8 C) (!) 109 18 - -  06/24/18 2210 - - - - - 211 lb (95.7 kg)   Constitutional: Well-developed, well-nourished female in no acute distress.  Cardiovascular: normal rate Respiratory: normal effort GI: Abd soft, non-tender, gravid appropriate for gestational age.  MS: Extremities nontender, no edema, normal ROM Neurologic: Alert and oriented x 4.  GU: Neg CVAT.  PELVIC EXAM: Cervix pink, visually closed, without lesion, scant white creamy discharge, difficult to visualize length of cervix or cerclage despite attempt with 2 different speculums, vaginal walls and external genitalia normal  Cervix closed/70/posterior, ballotable on initial exam  Dilation: 1 Effacement (%): 50 Cervical Position: Posterior Exam by:: Sharen Counter, CNM  FHT:  Baseline 135 , moderate variability, accelerations present, no decelerations Contractions: q 5-15 mins, irregular, mild to palpation   Labs: Results for orders placed or performed during the hospital encounter of 06/24/18 (from the past 24 hour(s))  Wet prep, genital     Status: Abnormal   Collection Time: 06/24/18 11:09 PM  Result Value Ref Range   Yeast Wet Prep HPF POC NONE SEEN NONE SEEN   Trich, Wet Prep NONE SEEN NONE SEEN   Clue Cells Wet Prep HPF POC NONE SEEN NONE SEEN   WBC, Wet Prep HPF POC MANY (A) NONE SEEN   Sperm NONE SEEN       Imaging:  No results found.  MAU Course/MDM: I have ordered labs and reviewed results.  NST reviewed  and reactive Cerclage intact without tension  Procardia 10 mg x 3 doses 20 minutes apart given, pt report of little change in her contractions On recheck of cervix, cervix felt to be 1 cm dilated but with 50% effacement or less so some changes but without evidence of tension on the cerclage Discussed option of observation and recheck in 1-2 hours, pt became very tearful, does not want to go home and labor and tear her cervix. Desires cerclage removed. Consult Dr Debroah Loop with presentation, exam findings and test results.  Dr Debroah Loop to bedside to see pt, discussed options and cerclage was removed in MAU tonight.  See  separate procedure note.    Assessment: 1. History of cervical incompetence in pregnancy, currently pregnant   2. Rubella non-immune status, antepartum   3. Encounter for supervision of other normal pregnancy in third trimester   4. Decreased fetal movement, third trimester, fetus 1   5. NST (non-stress test) reactive   6. Threatened preterm labor, third trimester     Plan: Discharge home Preterm precautions and fetal kick counts  Follow-up Information    [facility] Follow up.   Why:  As scheduled, return to MAU as needed for emergencies. Contact information: [address] [place] [phone]         Allergies as of 06/25/2018      Reactions   Shellfish Allergy    Only Crawfish not allergic to any other shellfish       Medication List    TAKE these medications   aspirin EC 81 MG tablet Take 162 mg by mouth daily.   diphenhydrAMINE 25 MG tablet Commonly known as:  BENADRYL Take 1 tablet (25 mg total) by mouth at bedtime as needed for itching.   PRENATAL ADULT GUMMY/DHA/FA PO Take 2 tablets by mouth daily.       Sharen CounterLisa Leftwich-Kirby Certified Nurse-Midwife 06/25/2018 1:57 AM

## 2018-06-25 ENCOUNTER — Telehealth: Payer: Self-pay | Admitting: Obstetrics and Gynecology

## 2018-06-25 LAB — BILE ACIDS, TOTAL: Bile Acids Total: 7.6 umol/L (ref 0.0–10.0)

## 2018-06-25 NOTE — Telephone Encounter (Signed)
Patient called stating that she would like for a nurse to give her a call back, pt did not state the reason for the call. Please contact pt

## 2018-06-25 NOTE — MAU Provider Note (Signed)
Patient was evaluated by Tara Mcmahon. Contractions still noted but irregular. Patient requests removal of cerclage today. I agree that this is reasonable at 2550w3d and will likely not affect her risk of PTL at this point She gave verbal consent for removal.  Using a large speculum and 2 ring forceps I was able to see the suture and grasp it. The merselline tape was cut and removed. Her cervix is long and FT dilated with ballotable presenting part. I gave her preterm labor precautions before discharge  Adam PhenixArnold, James G, MD 06/25/2018 1:49 AM

## 2018-06-25 NOTE — Discharge Instructions (Signed)

## 2018-06-26 NOTE — Telephone Encounter (Signed)
Pt called to let us know that she went to MAU and they removed her cerclage since she was contracting. She wanted to make sure that she didn't need to come in to have gbs today. Advised patient that she can wait until her scheduled appt on Friday. Patient agreeable and has no other questions at this time.

## 2018-06-28 ENCOUNTER — Telehealth: Payer: Self-pay | Admitting: *Deleted

## 2018-06-28 NOTE — Telephone Encounter (Signed)
Patient states her blood pressure is 137/87.  Feet swelling and some dizziness.  No headaches.  Informed patient [facility]81 so no much different from then. Advised to call us back or [facility] is she develops a HA unresolved with Tylenol, vision changes or right upper gastric pain. Has appt tomorrow so make sure comes.  Verbalized understanding.

## 2018-06-29 ENCOUNTER — Encounter: Payer: Self-pay | Admitting: Obstetrics and Gynecology

## 2018-06-29 ENCOUNTER — Ambulatory Visit (INDEPENDENT_AMBULATORY_CARE_PROVIDER_SITE_OTHER): Payer: Medicaid Other | Admitting: Obstetrics and Gynecology

## 2018-06-29 VITALS — BP 139/80 | HR 95 | Wt 207.0 lb

## 2018-06-29 DIAGNOSIS — Z1389 Encounter for screening for other disorder: Secondary | ICD-10-CM

## 2018-06-29 DIAGNOSIS — Z3483 Encounter for supervision of other normal pregnancy, third trimester: Secondary | ICD-10-CM

## 2018-06-29 DIAGNOSIS — Z3A36 36 weeks gestation of pregnancy: Secondary | ICD-10-CM | POA: Diagnosis not present

## 2018-06-29 DIAGNOSIS — Z331 Pregnant state, incidental: Secondary | ICD-10-CM

## 2018-06-29 LAB — POCT URINALYSIS DIPSTICK OB
Blood, UA: NEGATIVE
Glucose, UA: NEGATIVE — AB
Ketones, UA: NEGATIVE
Leukocytes, UA: NEGATIVE
Nitrite, UA: NEGATIVE
POC,PROTEIN,UA: NEGATIVE

## 2018-06-29 NOTE — Progress Notes (Signed)
Patient ID: Tara PealAshleigh A Marcou, female   DOB: 02-Jan-1996, 22 y.o.   MRN: [id number]    LOW-RISK PREGNANCY VISIT Patient name: Tara Mcmahon MRN [id number]  Date of birth: 02-Jan-1996 Chief Complaint:   low risk ob (gbs-gc-chl)  History of Present Illness:   Tara Pealshleigh A Muscat is a 22 y.o. [id number] female at 2948w0d with an Estimated Date of Delivery: 07/27/18 being seen today for ongoing management of a low-risk pregnancy.  Today she reports that she is very tired and that her heartburn that keeps her up all night. She has swelling in her hands. She is accompanied by her 2 children and the father of the baby. She breastfed one child but ended up with mastitis and she pumped for the other child. She would like to use BCPs after this child but does not remember the type she has used in the past. The patient denies fever, chills or any other symptoms or complaints at this time.    Contractions: Irregular.  .  Movement: Present. denies leaking of fluid. Review of Systems:   Pertinent items are noted in HPI Denies abnormal vaginal discharge w/ itching/odor/irritation, headaches, visual changes, shortness of breath, chest pain, abdominal pain, severe nausea/vomiting, or problems with urination or bowel movements unless otherwise stated above. Pertinent History Reviewed:  Reviewed past medical,surgical, social, obstetrical and family history.  Reviewed problem list, medications and allergies. Physical Assessment:   Vitals:   06/29/18 1142  BP: 139/80  Pulse: 95  Weight: 207 lb (93.9 kg)  Body mass index is 41.81 kg/m.        Physical Examination:   General appearance: Well appearing, and in no distress  Mental status: Alert, oriented to person, place, and time  Skin: Warm & dry  Cardiovascular: Normal heart rate noted  Respiratory: Normal respiratory effort, no distress  Abdomen: Soft, gravid, nontender  Pelvic: Cervical exam performed multiparous, long, 1cm dilated        Extremities: Edema:  Trace  Fetal Status:     Movement: Present    Results for orders placed or performed in visit on 06/29/18 (from the past 24 hour(s))  POC Urinalysis Dipstick OB   Collection Time: 06/29/18 11:47 AM  Result Value Ref Range   Color, UA     Clarity, UA     Glucose, UA Negative (A) (none)   Bilirubin, UA     Ketones, UA neg    Spec Grav, UA  1.010 - 1.025   Blood, UA neg    pH, UA  5.0 - 8.0   POC Protein UA Negative Negative, Trace   Urobilinogen, UA  0.2 or 1.0 E.U./dL   Nitrite, UA neg    Leukocytes, UA Negative Negative   Appearance     Odor      Assessment & Plan:  1) Low-risk pregnancy [id number] at 2548w0d with an Estimated Date of Delivery: 07/27/18    Meds: No orders of the defined types were placed in this encounter.  Labs/procedures today: GBS, GC/CHL  Plan:  Continue routine obstetrical care   Follow-up: Return in about 1 week (around 07/06/2018) for LROB.  Orders Placed This Encounter  Procedures  . Strep Gp B NAA  . GC/Chlamydia Probe Amp  . POC Urinalysis Dipstick OB   By signing my name below, I, Pietro CassisEmily Tufford, attest that this documentation has been prepared under the direction and in the presence of Eure, Amaryllis DykeLuther H, MD. Electronically Signed: Pietro CassisEmily Tufford, Medical Scribe. 06/29/18. 12:10 PM.  I personally  performed the services described in this documentation, which was SCRIBED in my presence. The recorded information has been reviewed and considered accurate. It has been edited as necessary during review. Tilda Burrow, MD

## 2018-06-30 ENCOUNTER — Encounter (HOSPITAL_COMMUNITY): Payer: Self-pay | Admitting: *Deleted

## 2018-06-30 ENCOUNTER — Inpatient Hospital Stay (HOSPITAL_COMMUNITY)
Admission: AD | Admit: 2018-06-30 | Discharge: 2018-06-30 | Disposition: A | Discharge status: Home / Self Care | Payer: Medicaid Other | Source: Ambulatory Visit | Attending: Obstetrics and Gynecology | Admitting: Obstetrics and Gynecology

## 2018-06-30 DIAGNOSIS — Z283 Underimmunization status: Secondary | ICD-10-CM

## 2018-06-30 DIAGNOSIS — O3433 Maternal care for cervical incompetence, third trimester: Secondary | ICD-10-CM | POA: Diagnosis not present

## 2018-06-30 DIAGNOSIS — O368130 Decreased fetal movements, third trimester, not applicable or unspecified: Secondary | ICD-10-CM | POA: Diagnosis not present

## 2018-06-30 DIAGNOSIS — Z3483 Encounter for supervision of other normal pregnancy, third trimester: Secondary | ICD-10-CM

## 2018-06-30 DIAGNOSIS — F419 Anxiety disorder, unspecified: Secondary | ICD-10-CM | POA: Diagnosis not present

## 2018-06-30 DIAGNOSIS — R51 Headache: Secondary | ICD-10-CM | POA: Diagnosis not present

## 2018-06-30 DIAGNOSIS — Z3A36 36 weeks gestation of pregnancy: Secondary | ICD-10-CM | POA: Insufficient documentation

## 2018-06-30 DIAGNOSIS — O99283 Endocrine, nutritional and metabolic diseases complicating pregnancy, third trimester: Secondary | ICD-10-CM | POA: Insufficient documentation

## 2018-06-30 DIAGNOSIS — O26893 Other specified pregnancy related conditions, third trimester: Secondary | ICD-10-CM | POA: Insufficient documentation

## 2018-06-30 DIAGNOSIS — O9989 Other specified diseases and conditions complicating pregnancy, childbirth and the puerperium: Secondary | ICD-10-CM

## 2018-06-30 DIAGNOSIS — E039 Hypothyroidism, unspecified: Secondary | ICD-10-CM | POA: Diagnosis not present

## 2018-06-30 DIAGNOSIS — O99343 Other mental disorders complicating pregnancy, third trimester: Secondary | ICD-10-CM | POA: Diagnosis not present

## 2018-06-30 DIAGNOSIS — O09899 Supervision of other high risk pregnancies, unspecified trimester: Secondary | ICD-10-CM

## 2018-06-30 NOTE — Discharge Instructions (Signed)

## 2018-06-30 NOTE — MAU Note (Signed)
Tara Mcmahon is a 22 y.o. at 5378w1d here in MAU reporting:  +decreased fetal movement Less than normal Endorses feeling movement while in lobby  Denies vaginal bleeding or LOF  +headache Pain score: 4/10 Has not taken anything for headache   FHT:142 Lab orders placed from triage: ua

## 2018-06-30 NOTE — MAU Note (Signed)
Urine in lab 

## 2018-06-30 NOTE — MAU Provider Note (Signed)
History   G4 P1-1-1-2 at 36 weeks and 1 day a.m. with decreased fetal movement this started this morning.  She denies any pain, vaginal bleeding or rupture membranes.  Her GBS is negative.  CSN: [id number]  Arrival date & time 06/30/18  1238   None     Chief Complaint  Patient presents with  . Decreased Fetal Movement  . Headache    HPI  Past Medical History:  Diagnosis Date  . Anxiety   . Cervical incompetence during pregnancy   . Depression   . Hypothyroidism   . PCOS (polycystic ovarian syndrome)     Past Surgical History:  Procedure Laterality Date  . CERVICAL CERCLAGE    . CERVICAL CERCLAGE N/A 03/09/2016   Procedure: [facility] CERCLAGE PLACEMENT;  Surgeon: Lazaro Arms, MD;  Location: [facility];  Service: Gynecology;  Laterality: N/A;  . CERVICAL CERCLAGE N/A 01/31/2018   Procedure: CERCLAGE CERVICAL;  Surgeon: Lazaro Arms, MD;  Location: [facility];  Service: Gynecology;  Laterality: N/A;  . labial reduction      Family History  Problem Relation Age of Onset  . Arthritis Mother   . Lupus Mother   . Autoimmune disease Mother   . Crohn's disease Father   . Cancer Maternal Grandmother        breast  . Heart disease Maternal Grandfather   . Heart disease Paternal Grandfather   . Hypertension Paternal Grandfather   . Lupus Maternal Aunt     Social History   Tobacco Use  . Smoking status: Never Smoker  . Smokeless tobacco: Never Used  Substance Use Topics  . Alcohol use: No    Frequency: Never    Comment: occ; not now  . Drug use: No    OB History    Gravida  4   Para  2   Term  1   Preterm  1   AB  1   Living  2     SAB  1   TAB      Ectopic      Multiple  0   Live Births  2           Review of Systems  Constitutional: Negative.   HENT: Negative.   Eyes: Negative.   Respiratory: Negative.   Cardiovascular: Negative.   Gastrointestinal: Negative.   Endocrine: Negative.   Genitourinary: Negative.   Musculoskeletal: Negative.    Skin: Negative.   Allergic/Immunologic: Negative.   Neurological: Negative.   Hematological: Negative.   Psychiatric/Behavioral: Negative.     Allergies  Shellfish allergy  Home Medications    LMP 10/12/2017 (Approximate)   Physical Exam  Constitutional: She is oriented to person, place, and time. She appears well-developed and well-nourished.  HENT:  Head: Normocephalic.  Eyes: Pupils are equal, round, and reactive to light.  Neck: Normal range of motion. Neck supple.  Cardiovascular: Normal rate, regular rhythm, normal heart sounds and intact distal pulses.  Pulmonary/Chest: Effort normal and breath sounds normal.  Abdominal: Soft. Bowel sounds are normal.  Musculoskeletal: Normal range of motion.  Neurological: She is alert and oriented to person, place, and time. She has normal strength. She displays a negative Romberg sign.  Skin: Skin is warm and dry.  Psychiatric: She has a normal mood and affect. Her behavior is normal.    MAU Course  Procedures (including critical care time)  Labs Reviewed - No data to display No results found.   1. Decreased fetal movements in third  trimester, single or unspecified fetus   2. Rubella non-immune status, antepartum   3. Encounter for supervision of other normal pregnancy in third trimester       MDM  Vital signs are stable fetal heart rate pattern is stable moderate variability with 15 x 15 accels.  We will discharge patient home in stable condition.

## 2018-07-01 LAB — STREP GP B NAA: Strep Gp B NAA: NEGATIVE

## 2018-07-03 LAB — GC/CHLAMYDIA PROBE AMP
Chlamydia trachomatis, NAA: NEGATIVE
Neisseria gonorrhoeae by PCR: NEGATIVE

## 2018-07-06 ENCOUNTER — Emergency Department (HOSPITAL_COMMUNITY)
Admission: EM | Admit: 2018-07-06 | Discharge: 2018-07-06 | Disposition: A | Discharge status: Home / Self Care | Payer: Medicaid Other | Attending: Emergency Medicine | Admitting: Emergency Medicine

## 2018-07-06 ENCOUNTER — Encounter: Payer: Self-pay | Admitting: Obstetrics & Gynecology

## 2018-07-06 ENCOUNTER — Other Ambulatory Visit: Payer: Self-pay

## 2018-07-06 ENCOUNTER — Ambulatory Visit (INDEPENDENT_AMBULATORY_CARE_PROVIDER_SITE_OTHER): Payer: Medicaid Other | Admitting: Obstetrics & Gynecology

## 2018-07-06 ENCOUNTER — Encounter (HOSPITAL_COMMUNITY): Payer: Self-pay | Admitting: Emergency Medicine

## 2018-07-06 VITALS — BP 125/81 | HR 96 | Wt 207.0 lb

## 2018-07-06 DIAGNOSIS — Z203 Contact with and (suspected) exposure to rabies: Secondary | ICD-10-CM | POA: Diagnosis not present

## 2018-07-06 DIAGNOSIS — Z3A37 37 weeks gestation of pregnancy: Secondary | ICD-10-CM | POA: Insufficient documentation

## 2018-07-06 DIAGNOSIS — E039 Hypothyroidism, unspecified: Secondary | ICD-10-CM | POA: Insufficient documentation

## 2018-07-06 DIAGNOSIS — Z79899 Other long term (current) drug therapy: Secondary | ICD-10-CM | POA: Diagnosis not present

## 2018-07-06 DIAGNOSIS — O9989 Other specified diseases and conditions complicating pregnancy, childbirth and the puerperium: Secondary | ICD-10-CM | POA: Diagnosis not present

## 2018-07-06 DIAGNOSIS — Z23 Encounter for immunization: Secondary | ICD-10-CM | POA: Insufficient documentation

## 2018-07-06 DIAGNOSIS — Z2914 Encounter for prophylactic rabies immune globin: Secondary | ICD-10-CM | POA: Diagnosis not present

## 2018-07-06 DIAGNOSIS — Z209 Contact with and (suspected) exposure to unspecified communicable disease: Secondary | ICD-10-CM

## 2018-07-06 DIAGNOSIS — Z1389 Encounter for screening for other disorder: Secondary | ICD-10-CM

## 2018-07-06 DIAGNOSIS — Z331 Pregnant state, incidental: Secondary | ICD-10-CM

## 2018-07-06 DIAGNOSIS — Z3483 Encounter for supervision of other normal pregnancy, third trimester: Secondary | ICD-10-CM

## 2018-07-06 LAB — POCT URINALYSIS DIPSTICK OB
Blood, UA: NEGATIVE
Glucose, UA: NEGATIVE — AB
Leukocytes, UA: NEGATIVE
NITRITE UA: NEGATIVE

## 2018-07-06 MED ORDER — RABIES VACCINE, PCEC IM SUSR
1.0000 mL | Freq: Once | INTRAMUSCULAR | Status: AC
  Administered 2018-07-06: 1 mL via INTRAMUSCULAR
  Filled 2018-07-06: qty 1

## 2018-07-06 MED ORDER — RABIES IMMUNE GLOBULIN 150 UNIT/ML IM INJ
20.0000 [IU]/kg | INJECTION | Freq: Once | INTRAMUSCULAR | Status: AC
  Administered 2018-07-06: 1875 [IU] via INTRAMUSCULAR
  Filled 2018-07-06: qty 12.5

## 2018-07-06 MED ORDER — RABIES IMMUNE GLOBULIN 150 UNIT/ML IM INJ
INJECTION | INTRAMUSCULAR | Status: AC
  Filled 2018-07-06: qty 14

## 2018-07-06 NOTE — ED Notes (Signed)
Animal Control notified

## 2018-07-06 NOTE — Progress Notes (Signed)
[id number] 741w0d Estimated Date of Delivery: 07/27/18  Blood pressure 125/81, pulse 96, weight 207 lb (93.9 kg), last menstrual period 10/12/2017, not currently breastfeeding.   BP weight and urine results all reviewed and noted.  Please refer to the obstetrical flow sheet for the fundal height and fetal heart rate documentation:  Patient reports good fetal movement, denies any bleeding and no rupture of membranes symptoms or regular contractions. Patient is without complaints. All questions were answered.  Orders Placed This Encounter  Procedures  . POC Urinalysis Dipstick OB    Plan:  Continued routine obstetrical care,  Pt was exposed to a bat and is receiving the Ig and vaccinations, talked woth Dr Zenaida NieceVan Dam(ID) and he recommended to continue the series out, her husband and 2 kids are also receiving the vaccine  Return in about 1 week (around 07/13/2018) for LROB.

## 2018-07-06 NOTE — ED Provider Notes (Addendum)
[facility] EMERGENCY DEPARTMENT Provider Note   CSN: [id number] Arrival date & time: 07/06/18  [id number]  Time seen 05:20 AM   History   Chief Complaint No chief complaint on file.   HPI Tara Mcmahon is a 22 y.o. female.  HPI Patient states she is [redacted] weeks pregnant and has insomnia and has been dozing off and on throughout the night.  She states she went outside about 12 midnight because she heard some dogs barking.  At about 3 AM she was scrolling on her phone she saw something fluttering in the corner of the room because her night light was on and she noticed it was a bat.  They state they got the bat outside and did not keep the bat.  Patient is G4, P2 Ab1, approximately [redacted] weeks [facility]  Patient states she is high risk and has a OB appointment today at 9811.  She did not want to get the rabies vaccination that was recommended by poison control and she was advised they highly recommended it even though she is pregnant.  Interestingly when I review her chart she is called low risk by her OB.  PCP Patient, No Pcp Per [facility]  Past Medical History:  Diagnosis Date  . Anxiety   . Cervical incompetence during pregnancy   . Depression   . Hypothyroidism   . PCOS (polycystic ovarian syndrome)     Patient Active Problem List   Diagnosis Date Noted  . Rubella non-immune status, antepartum 12/14/2017  . Supervision of normal pregnancy 12/13/2017  . Abdominal cramps 11/27/2017  . Grief reaction 11/27/2017  . History of cervical incompetence in pregnancy, currently pregnant 11/23/2017  . History of preterm delivery, currently pregnant 11/23/2017  . History of PCOS 06/23/2017  . Dysmenorrhea 06/23/2017  . Menorrhagia with irregular cycle 06/23/2017  . Fever 09/11/2016  . Breast pain 09/11/2016  . Gestational hypertension 08/29/2016  . Subclinical hypothyroidism 02/04/2016  . History of suicidal ideation 02/03/2016  . PCOS (polycystic ovarian  syndrome) 01/27/2016  . Depression with anxiety 01/27/2016  . Family history of systemic lupus erythematosus (SLE) in mother 01/27/2016    Past Surgical History:  Procedure Laterality Date  . CERVICAL CERCLAGE    . CERVICAL CERCLAGE N/A 03/09/2016   [facility];  Surgeon: Lazaro ArmsLuther H Eure, MD;  Location: [facility] ORS;  Service: Gynecology;  Laterality: N/A;  . CERVICAL CERCLAGE N/A 01/31/2018   Procedure: CERCLAGE CERVICAL;  Surgeon: Lazaro ArmsEure, Luther H, MD;  Location: [facility] ORS;  Service: Gynecology;  Laterality: N/A;  . labial reduction       OB History    Gravida  4   Para  2   Term  1   Preterm  1   AB  1   Living  2     SAB  1   TAB      Ectopic      Multiple  0   Live Births  2            Home Medications    Prior to Admission medications   Medication Sig Start Date End Date Taking? Authorizing Provider  aspirin EC 81 MG tablet Take 162 mg by mouth daily.    [provider]  diphenhydrAMINE (BENADRYL) 25 MG tablet Take 1 tablet (25 mg total) by mouth at bedtime as needed for itching. 06/22/18   Tilda BurrowFerguson, John V, MD  Prenatal MV & Min w/FA-DHA (PRENATAL ADULT GUMMY/DHA/FA PO)  Take 2 tablets by mouth daily.    [provider]    Family History Family History  Problem Relation Age of Onset  . Arthritis Mother   . Lupus Mother   . Autoimmune disease Mother   . Crohn's disease Father   . Cancer Maternal Grandmother        breast  . Heart disease Maternal Grandfather   . Heart disease Paternal Grandfather   . Hypertension Paternal Grandfather   . Lupus Maternal Aunt     Social History Social History   Tobacco Use  . Smoking status: Never Smoker  . Smokeless tobacco: Never Used  Substance Use Topics  . Alcohol use: No    Frequency: Never    Comment: occ; not now  . Drug use: No     Allergies   Shellfish allergy   Review of Systems Review of Systems  All other systems reviewed and are  negative.    Physical Exam Updated Vital Signs BP 127/88   Pulse (!) 102   Resp 18   LMP 10/12/2017 (Approximate)   SpO2 99%   Physical Exam  Constitutional: She is oriented to person, place, and time. She appears well-developed and well-nourished.  HENT:  Head: Normocephalic and atraumatic.  Right Ear: External ear normal.  Left Ear: External ear normal.  Eyes: Conjunctivae and EOM are normal.  Neck: Normal range of motion.  Cardiovascular: Normal rate.  Pulmonary/Chest: Effort normal. No respiratory distress.  Neurological: She is alert and oriented to person, place, and time. No cranial nerve deficit.  Skin: No erythema.  Psychiatric: She has a normal mood and affect. Her behavior is normal. Thought content normal.  Nursing note and vitals reviewed.    ED Treatments / Results  Labs (all labs ordered are listed, but only abnormal results are displayed) Labs Reviewed - No data to display  EKG None  Radiology No results found.  Procedures Procedures (including critical care time)  Medications Ordered in ED Medications  rabies vaccine (RABAVERT) injection 1 mL (has no administration in time range)  rabies immune globulin (HYPERAB/KEDRAB) injection 1,875 Units (has no administration in time range)     Initial Impression / Assessment and Plan / ED Course  I have reviewed the triage vital signs and the nursing notes.  Pertinent labs & imaging results that were available during my care of the patient were reviewed by me and considered in my medical decision making (see chart for details).     I talked to poison control at 5:25 AM and they recommend the whole family start the series of rabies vaccine and the rabies immunoglobulin this morning.  They were aware of the patient was [redacted] weeks pregnant.  Patient's were given a follow-up schedule to go to finish the rabies series as an outpatient.  Final Clinical Impressions(s) / ED Diagnoses   Final diagnoses:   None    ED Discharge Orders    None      Plan discharge  Devoria Albe, MD, Concha Pyo, MD 07/06/18 [id number]    Devoria Albe, MD 07/06/18 [phone]

## 2018-07-06 NOTE — Discharge Instructions (Signed)
Follow the schedule for your next rabies vaccines which will be one injection each visit.

## 2018-07-06 NOTE — ED Triage Notes (Signed)
Pt states she saw a bat flying around the room.

## 2018-07-07 ENCOUNTER — Other Ambulatory Visit: Payer: Self-pay

## 2018-07-07 ENCOUNTER — Encounter (HOSPITAL_COMMUNITY): Payer: Self-pay

## 2018-07-07 ENCOUNTER — Inpatient Hospital Stay (HOSPITAL_COMMUNITY)
Admission: AD | Admit: 2018-07-07 | Discharge: 2018-07-07 | Disposition: A | Discharge status: Home / Self Care | Payer: Medicaid Other | Source: Ambulatory Visit | Attending: Family Medicine | Admitting: Family Medicine

## 2018-07-07 DIAGNOSIS — O479 False labor, unspecified: Secondary | ICD-10-CM

## 2018-07-07 DIAGNOSIS — Z3A00 Weeks of gestation of pregnancy not specified: Secondary | ICD-10-CM | POA: Diagnosis not present

## 2018-07-07 DIAGNOSIS — O471 False labor at or after 37 completed weeks of gestation: Secondary | ICD-10-CM

## 2018-07-07 NOTE — MAU Note (Addendum)
G4P2 @ 37.[redacted] wksga. Here for contractions that's been all day but getting worse. Was seen earlier today and sent home dt occas ctx. SVE prior to d/c was 4cm.   Denies LOF or bleeding. +FM  EFM applied.  VE: 4/60/-3 vertex   2057: Provider notified. Report status of pt given. Orders received to have pt walk for an hr if desires and recheck cervix in an hr.   2101: pt sent walking. Instructed pt to return in an hour.   2207: back from walking for an hr. VE 4/70/-3 bag felt   2216: Orders received to inform pt to google [place] and if pt want's therapeutic meds for the pain.    2219: Pt declines meds. Provider made aware. Placing d/c orders.   2224: [facility]

## 2018-07-07 NOTE — Discharge Instructions (Signed)
Braxton Hicks Contractions °Contractions of the uterus can occur throughout pregnancy, but they are not always a sign that you are in labor. You may have practice contractions called Braxton Hicks contractions. These false labor contractions are sometimes confused with true labor. °What are Braxton Hicks contractions? °Braxton Hicks contractions are tightening movements that occur in the muscles of the uterus before labor. Unlike true labor contractions, these contractions do not result in opening (dilation) and thinning of the cervix. Toward the end of pregnancy (32-34 weeks), Braxton Hicks contractions can happen more often and may become stronger. These contractions are sometimes difficult to tell apart from true labor because they can be very uncomfortable. You should not feel embarrassed if you go to the hospital with false labor. °Sometimes, the only way to tell if you are in true labor is for your health care provider to look for changes in the cervix. The health care provider will do a physical exam and may monitor your contractions. If you are not in true labor, the exam should show that your cervix is not dilating and your water has not broken. °If there are other health problems associated with your pregnancy, it is completely safe for you to be sent home with false labor. You may continue to have Braxton Hicks contractions until you go into true labor. °How to tell the difference between true labor and false labor °True labor °· Contractions last 30-70 seconds. °· Contractions become very regular. °· Discomfort is usually felt in the top of the uterus, and it spreads to the lower abdomen and low back. °· Contractions do not go away with walking. °· Contractions usually become more intense and increase in frequency. °· The cervix dilates and gets thinner. °False labor °· Contractions are usually shorter and not as strong as true labor contractions. °· Contractions are usually irregular. °· Contractions  are often felt in the front of the lower abdomen and in the groin. °· Contractions may go away when you walk around or change positions while lying down. °· Contractions get weaker and are shorter-lasting as time goes on. °· The cervix usually does not dilate or become thin. °Follow these instructions at home: °· Take over-the-counter and prescription medicines only as told by your health care provider. °· Keep up with your usual exercises and follow other instructions from your health care provider. °· Eat and drink lightly if you think you are going into labor. °· If Braxton Hicks contractions are making you uncomfortable: °? Change your position from lying down or resting to walking, or change from walking to resting. °? Sit and rest in a tub of warm water. °? Drink enough fluid to keep your urine pale yellow. Dehydration may cause these contractions. °? Do slow and deep breathing several times an hour. °· Keep all follow-up prenatal visits as told by your health care provider. This is important. °Contact a health care provider if: °· You have a fever. °· You have continuous pain in your abdomen. °Get help right away if: °· Your contractions become stronger, more regular, and closer together. °· You have fluid leaking or gushing from your vagina. °· You pass blood-tinged mucus (bloody show). °· You have bleeding from your vagina. °· You have low back pain that you never had before. °· You feel your baby’s head pushing down and causing pelvic pressure. °· Your baby is not moving inside you as much as it used to. °Summary °· Contractions that occur before labor are called Braxton   Hicks contractions, false labor, or practice contractions. °· Braxton Hicks contractions are usually shorter, weaker, farther apart, and less regular than true labor contractions. True labor contractions usually become progressively stronger and regular and they become more frequent. °· Manage discomfort from Braxton Hicks contractions by  changing position, resting in a warm bath, drinking plenty of water, or practicing deep breathing. °This information is not intended to replace advice given to you by your health care provider. Make sure you discuss any questions you have with your health care provider. °Document Released: 03/30/2017 Document Revised: 03/30/2017 Document Reviewed: 03/30/2017 °Elsevier Interactive Patient Education © 2018 [employer] ° °

## 2018-07-07 NOTE — Discharge Instructions (Signed)

## 2018-07-07 NOTE — MAU Note (Signed)
Ctx have been getting stronger through the day, every 6-10 min, may have spaced out  No vaginal bleeding, no LOF, +FM

## 2018-07-07 NOTE — MAU Note (Signed)
I have communicated with Raelyn Moraolitta Dawson CNM and reviewed vital signs:  Vitals:   07/07/18 1427 07/07/18 1524  BP: 128/88 127/75  Pulse: (!) 104 99  Resp: 18 20  Temp: 97.8 F (36.6 C)     Vaginal exam:  Dilation: 4 Effacement (%): 70, 80 Cervical Position: Anterior Station: -3 Presentation: Undeterminable Exam by:: Marvel PlanJessica Aristea Posada RN ,   Also reviewed contraction pattern and that non-stress test is reactive.  It has been documented that patient is not contracting with minimal cervical change over 1 hour not indicating active labor.  Patient denies any other complaints.  Based on this report provider has given order for discharge.  A discharge order and diagnosis entered by a provider.   Labor discharge instructions reviewed with patient.

## 2018-07-07 NOTE — MAU Note (Signed)
Naomie DeanJaneen McClellan, RN has communicated with Sharen CounterLisa Leftwich-Kirby and reviewed vital signs:  Vitals:   07/07/18 2027 07/07/18 2232  BP:  125/74  Pulse:  (!) 107  Resp:  18  SpO2: 97%     Vaginal exam:  Dilation: 4 Effacement (%): 70 Cervical Position: Middle, Anterior Station: -3 Presentation: Vertex Exam by:: J McClelan, RNC,   Also reviewed contraction pattern and that non-stress test is reactive.  It has been documented that patient is contracting irregularly with no cervical change over 2 hrs hours not indicating active labor.  Patient denies any other complaints.  Based on this report provider has given order for discharge.  A discharge order and diagnosis entered by a provider.   Labor discharge instructions reviewed with patient.

## 2018-07-08 ENCOUNTER — Inpatient Hospital Stay (HOSPITAL_COMMUNITY)
Admission: AD | Admit: 2018-07-08 | Discharge: 2018-07-08 | Disposition: A | Discharge status: Home / Self Care | Payer: Medicaid Other | Source: Ambulatory Visit | Attending: Obstetrics and Gynecology | Admitting: Obstetrics and Gynecology

## 2018-07-08 ENCOUNTER — Encounter (HOSPITAL_COMMUNITY): Payer: Self-pay

## 2018-07-08 ENCOUNTER — Other Ambulatory Visit: Payer: Self-pay

## 2018-07-08 DIAGNOSIS — Z0371 Encounter for suspected problem with amniotic cavity and membrane ruled out: Secondary | ICD-10-CM

## 2018-07-08 DIAGNOSIS — Z3A37 37 weeks gestation of pregnancy: Secondary | ICD-10-CM

## 2018-07-08 DIAGNOSIS — Z2839 Other underimmunization status: Secondary | ICD-10-CM

## 2018-07-08 DIAGNOSIS — O26893 Other specified pregnancy related conditions, third trimester: Secondary | ICD-10-CM | POA: Insufficient documentation

## 2018-07-08 DIAGNOSIS — O9989 Other specified diseases and conditions complicating pregnancy, childbirth and the puerperium: Secondary | ICD-10-CM

## 2018-07-08 DIAGNOSIS — R6889 Other general symptoms and signs: Secondary | ICD-10-CM | POA: Diagnosis not present

## 2018-07-08 DIAGNOSIS — Z283 Underimmunization status: Secondary | ICD-10-CM

## 2018-07-08 LAB — AMNISURE RUPTURE OF MEMBRANE (ROM) NOT AT ARMC: Amnisure ROM: NEGATIVE

## 2018-07-08 LAB — POCT FERN TEST: POCT Fern Test: NEGATIVE

## 2018-07-08 NOTE — MAU Note (Signed)
Pt presents with c/o LOG since 2300, clear fluid.  Denies VB, but reports bloody mucous.  Reports +FM.

## 2018-07-08 NOTE — MAU Provider Note (Signed)
First Provider Initiated Contact with Patient 07/08/18 1622       S: [name] A [place] is a 22 y.o. [id number] at 5842w2d  who presents to MAU today complaining of leaking of fluid since 2300 yesterday. She denies vaginal bleeding. She denies contractions. She reports normal fetal movement.    O: BP 133/83 (BP Location: Right Arm)   Temp 97.9 F (36.6 C) (Oral)   Resp 20   Ht 4\' 11"  (1.499 m)   Wt 94.1 kg   LMP 10/12/2017 (Approximate)   SpO2 100%   BMI 41.91 kg/m  GENERAL: Well-developed, well-nourished female in no acute distress.  HEAD: Normocephalic, atraumatic.  CHEST: Normal effort of breathing, regular heart rate ABDOMEN: Soft, nontender, gravid PELVIC: Normal external female genitalia. Vagina is pink and rugated. Cervix with normal contour, no lesions. Normal discharge.  No pooling.   Cervical exam:      Fetal Monitoring: Baseline: 145 Variability: moderate Accelerations: 15x15 Decelerations: none Contractions: rare  Results for orders placed or performed during the hospital encounter of 07/08/18 (from the past 24 hour(s))  POCT fern test     Status: None   Collection Time: 07/08/18  4:30 PM  Result Value Ref Range   POCT Fern Test Negative = intact amniotic membranes   Am[facility]isure [facility] (rom)[facility]     Status: None   Collection Time: 07/08/18  4:56 PM  Result Value Ref Range   Amnisure ROM NEGATIVE      A: SIUP at 3642w2d  Membranes intact  P: DC home   Armando ReichertHogan, Heather D, CNM 07/08/2018 5:29 PM

## 2018-07-08 NOTE — Discharge Instructions (Signed)
Braxton Hicks Contractions °Contractions of the uterus can occur throughout pregnancy, but they are not always a sign that you are in labor. You may have practice contractions called Braxton Hicks contractions. These false labor contractions are sometimes confused with true labor. °What are Braxton Hicks contractions? °Braxton Hicks contractions are tightening movements that occur in the muscles of the uterus before labor. Unlike true labor contractions, these contractions do not result in opening (dilation) and thinning of the cervix. Toward the end of pregnancy (32-34 weeks), Braxton Hicks contractions can happen more often and may become stronger. These contractions are sometimes difficult to tell apart from true labor because they can be very uncomfortable. You should not feel embarrassed if you go to the hospital with false labor. °Sometimes, the only way to tell if you are in true labor is for your health care provider to look for changes in the cervix. The health care provider will do a physical exam and may monitor your contractions. If you are not in true labor, the exam should show that your cervix is not dilating and your water has not broken. °If there are other health problems associated with your pregnancy, it is completely safe for you to be sent home with false labor. You may continue to have Braxton Hicks contractions until you go into true labor. °How to tell the difference between true labor and false labor °True labor °· Contractions last 30-70 seconds. °· Contractions become very regular. °· Discomfort is usually felt in the top of the uterus, and it spreads to the lower abdomen and low back. °· Contractions do not go away with walking. °· Contractions usually become more intense and increase in frequency. °· The cervix dilates and gets thinner. °False labor °· Contractions are usually shorter and not as strong as true labor contractions. °· Contractions are usually irregular. °· Contractions  are often felt in the front of the lower abdomen and in the groin. °· Contractions may go away when you walk around or change positions while lying down. °· Contractions get weaker and are shorter-lasting as time goes on. °· The cervix usually does not dilate or become thin. °Follow these instructions at home: °· Take over-the-counter and prescription medicines only as told by your health care provider. °· Keep up with your usual exercises and follow other instructions from your health care provider. °· Eat and drink lightly if you think you are going into labor. °· If Braxton Hicks contractions are making you uncomfortable: °? Change your position from lying down or resting to walking, or change from walking to resting. °? Sit and rest in a tub of warm water. °? Drink enough fluid to keep your urine pale yellow. Dehydration may cause these contractions. °? Do slow and deep breathing several times an hour. °· Keep all follow-up prenatal visits as told by your health care provider. This is important. °Contact a health care provider if: °· You have a fever. °· You have continuous pain in your abdomen. °Get help right away if: °· Your contractions become stronger, more regular, and closer together. °· You have fluid leaking or gushing from your vagina. °· You pass blood-tinged mucus (bloody show). °· You have bleeding from your vagina. °· You have low back pain that you never had before. °· You feel your baby’s head pushing down and causing pelvic pressure. °· Your baby is not moving inside you as much as it used to. °Summary °· Contractions that occur before labor are called Braxton   Hicks contractions, false labor, or practice contractions. °· Braxton Hicks contractions are usually shorter, weaker, farther apart, and less regular than true labor contractions. True labor contractions usually become progressively stronger and regular and they become more frequent. °· Manage discomfort from Braxton Hicks contractions by  changing position, resting in a warm bath, drinking plenty of water, or practicing deep breathing. °This information is not intended to replace advice given to you by your health care provider. Make sure you discuss any questions you have with your health care provider. °Document Released: 03/30/2017 Document Revised: 03/30/2017 Document Reviewed: 03/30/2017 °Elsevier Interactive Patient Education © 2018 [employer] ° °

## 2018-07-08 NOTE — MAU Note (Signed)
I have communicated with Thressa ShellerHeather Hogan CNM and reviewed vital signs:  Vitals:   07/08/18 1515 07/08/18 1753  BP: 133/83 123/71  Pulse:  (!) 104  Resp: 20 20  Temp: 97.9 F (36.6 C)   SpO2: 100%     Vaginal exam:  Dilation: 4 Effacement (%): 70 Cervical Position: Anterior Station: -3 Presentation: Undeterminable Exam by:: Marvel PlanJessica Tamanika Heiney RN ,   Also reviewed contraction pattern and that non-stress test is reactive.  It has been documented that patient is contracting irregularly  with no cervical change over 1 hours not indicating active labor.Pt. amnisure was negative.   Patient denies any other complaints.  Based on this report provider has given order for discharge.  A discharge order and diagnosis entered by a provider.   Labor discharge instructions reviewed with patient.

## 2018-07-09 ENCOUNTER — Telehealth: Payer: Self-pay | Admitting: *Deleted

## 2018-07-09 ENCOUNTER — Ambulatory Visit (HOSPITAL_COMMUNITY)
Admission: EM | Admit: 2018-07-09 | Discharge: 2018-07-09 | Disposition: A | Discharge status: Home / Self Care | Payer: Medicaid Other | Attending: Family Medicine | Admitting: Family Medicine

## 2018-07-09 ENCOUNTER — Telehealth: Payer: Self-pay | Admitting: Obstetrics & Gynecology

## 2018-07-09 DIAGNOSIS — Z23 Encounter for immunization: Secondary | ICD-10-CM | POA: Diagnosis not present

## 2018-07-09 DIAGNOSIS — Z203 Contact with and (suspected) exposure to rabies: Secondary | ICD-10-CM

## 2018-07-09 MED ORDER — SERTRALINE HCL 25 MG PO TABS: 25.0000 mg | ORAL_TABLET | Freq: Every day | ORAL | 11 refills | Status: DC

## 2018-07-09 MED ORDER — RABIES VACCINE, PCEC IM SUSR
INTRAMUSCULAR | Status: AC
  Filled 2018-07-09: qty 1

## 2018-07-09 MED ORDER — RABIES VACCINE, PCEC IM SUSR
1.0000 mL | Freq: Once | INTRAMUSCULAR | Status: AC
  Administered 2018-07-09: 1 mL via INTRAMUSCULAR

## 2018-07-09 NOTE — ED Notes (Signed)
Bed: UCTR Expected date:  Expected time:  Means of arrival:  Comments: Triage 

## 2018-07-09 NOTE — Telephone Encounter (Signed)
Meds ordered this encounter  Medications  . sertraline (ZOLOFT) 25 MG tablet    Sig: Take 1 tablet (25 mg total) by mouth daily.    Dispense:  30 tablet    Refill:  11

## 2018-07-09 NOTE — ED Triage Notes (Signed)
Pt here for day 3 rabies injection.  Pt reports no issues.  She is here with three other members of her family whom were all exposed to a bat.

## 2018-07-09 NOTE — Telephone Encounter (Signed)
Patient states she is starting to experience some anxiety.  Says she has been doing "really good" but feels like she needs to start back on low dose Zoloft since she has caught it early.  Please advise.

## 2018-07-11 ENCOUNTER — Telehealth: Payer: Self-pay | Admitting: Obstetrics & Gynecology

## 2018-07-11 ENCOUNTER — Ambulatory Visit (INDEPENDENT_AMBULATORY_CARE_PROVIDER_SITE_OTHER): Payer: Medicaid Other

## 2018-07-11 VITALS — BP 138/87 | HR 108 | Ht 59.0 in | Wt 207.0 lb

## 2018-07-11 DIAGNOSIS — Z0130 Encounter for examination of blood pressure without abnormal findings: Secondary | ICD-10-CM

## 2018-07-11 DIAGNOSIS — Z1389 Encounter for screening for other disorder: Secondary | ICD-10-CM

## 2018-07-11 DIAGNOSIS — Z331 Pregnant state, incidental: Secondary | ICD-10-CM

## 2018-07-11 LAB — POCT URINALYSIS DIPSTICK OB
Glucose, UA: NEGATIVE — AB
Ketones, UA: NEGATIVE
LEUKOCYTES UA: NEGATIVE
NITRITE UA: NEGATIVE
RBC UA: NEGATIVE

## 2018-07-11 NOTE — Progress Notes (Signed)
Pt came to office because blood pressure at 1 pm 134/98, had some pain on left arm.At 3:30 pm 126/94. Call office was concern at blood pressure had drop.Came office blood pressure 138/87 pulse 108. No complaint. Spoke Dr Emelda FearFerguson since blood pressure running about same and had trace protein. Advise take blood pressure twice a day. Keep appointment on Friday.Pad CMA

## 2018-07-11 NOTE — Telephone Encounter (Signed)
Patient states she has been checking her BP at home.  Has had some elevated blood pressures but it is now down to 120/80's.  Will get patient to come in for BP check with nurse.

## 2018-07-11 NOTE — Telephone Encounter (Signed)
Patient Called stating that she would like a call back from the nurse, Patient did not states the reason why. Please contact pt

## 2018-07-13 ENCOUNTER — Encounter: Payer: Self-pay | Admitting: Obstetrics & Gynecology

## 2018-07-13 ENCOUNTER — Ambulatory Visit (INDEPENDENT_AMBULATORY_CARE_PROVIDER_SITE_OTHER): Payer: Medicaid Other | Admitting: Obstetrics & Gynecology

## 2018-07-13 ENCOUNTER — Ambulatory Visit (HOSPITAL_COMMUNITY)
Admission: EM | Admit: 2018-07-13 | Discharge: 2018-07-13 | Disposition: A | Discharge status: Home / Self Care | Payer: Medicaid Other | Attending: Family Medicine | Admitting: Family Medicine

## 2018-07-13 VITALS — BP 126/85 | HR 114 | Wt 209.3 lb

## 2018-07-13 DIAGNOSIS — Z1389 Encounter for screening for other disorder: Secondary | ICD-10-CM

## 2018-07-13 DIAGNOSIS — Z203 Contact with and (suspected) exposure to rabies: Secondary | ICD-10-CM | POA: Diagnosis not present

## 2018-07-13 DIAGNOSIS — Z3A38 38 weeks gestation of pregnancy: Secondary | ICD-10-CM | POA: Diagnosis not present

## 2018-07-13 DIAGNOSIS — Z23 Encounter for immunization: Secondary | ICD-10-CM | POA: Diagnosis not present

## 2018-07-13 DIAGNOSIS — Z3483 Encounter for supervision of other normal pregnancy, third trimester: Secondary | ICD-10-CM | POA: Diagnosis not present

## 2018-07-13 DIAGNOSIS — Z331 Pregnant state, incidental: Secondary | ICD-10-CM

## 2018-07-13 LAB — POCT URINALYSIS DIPSTICK OB
GLUCOSE, UA: NEGATIVE — AB
Ketones, UA: NEGATIVE
Leukocytes, UA: NEGATIVE
Nitrite, UA: NEGATIVE
RBC UA: NEGATIVE

## 2018-07-13 MED ORDER — RABIES VACCINE, PCEC IM SUSR
1.0000 mL | Freq: Once | INTRAMUSCULAR | Status: AC
  Administered 2018-07-13: 1 mL via INTRAMUSCULAR

## 2018-07-13 NOTE — ED Notes (Signed)
Per Dr. Milus GlazierLauenstein, pt okay to have rabies injection. Pt is 9 months pregnant.

## 2018-07-13 NOTE — ED Triage Notes (Signed)
Pt here for day 7 rabies vaccine 

## 2018-07-13 NOTE — Progress Notes (Signed)
[id number] 3757w0d Estimated Date of Delivery: 07/27/18  Blood pressure 126/85, pulse (!) 114, weight 209 lb 4.8 oz (94.9 kg), last menstrual period 10/12/2017, not currently breastfeeding.   BP weight and urine results all reviewed and noted.  Please refer to the obstetrical flow sheet for the fundal height and fetal heart rate documentation:  Patient reports good fetal movement, denies any bleeding and no rupture of membranes symptoms or regular contractions. Patient is without complaints. All questions were answered.  Orders Placed This Encounter  Procedures  . POC Urinalysis Dipstick OB    Plan:  Continued routine obstetrical care, cx 3/50/-3/vtx/soft/anterior  Return in about 1 week (around 07/20/2018) for LROB.

## 2018-07-14 ENCOUNTER — Inpatient Hospital Stay (HOSPITAL_COMMUNITY)
Admission: AD | Admit: 2018-07-14 | Discharge: 2018-07-14 | Disposition: A | Discharge status: Home / Self Care | Payer: Medicaid Other | Source: Ambulatory Visit | Attending: Obstetrics and Gynecology | Admitting: Obstetrics and Gynecology

## 2018-07-14 ENCOUNTER — Encounter (HOSPITAL_COMMUNITY): Payer: Self-pay | Admitting: *Deleted

## 2018-07-14 DIAGNOSIS — Z3A38 38 weeks gestation of pregnancy: Secondary | ICD-10-CM

## 2018-07-14 DIAGNOSIS — Z7982 Long term (current) use of aspirin: Secondary | ICD-10-CM | POA: Diagnosis not present

## 2018-07-14 DIAGNOSIS — O368130 Decreased fetal movements, third trimester, not applicable or unspecified: Secondary | ICD-10-CM

## 2018-07-14 DIAGNOSIS — Z3483 Encounter for supervision of other normal pregnancy, third trimester: Secondary | ICD-10-CM

## 2018-07-14 DIAGNOSIS — O9989 Other specified diseases and conditions complicating pregnancy, childbirth and the puerperium: Secondary | ICD-10-CM

## 2018-07-14 DIAGNOSIS — O09899 Supervision of other high risk pregnancies, unspecified trimester: Secondary | ICD-10-CM

## 2018-07-14 DIAGNOSIS — Z2839 Encounter for supervision of normal pregnancy, unspecified, unspecified trimester: Secondary | ICD-10-CM

## 2018-07-14 DIAGNOSIS — Z283 Underimmunization status: Secondary | ICD-10-CM

## 2018-07-14 NOTE — Discharge Instructions (Signed)

## 2018-07-14 NOTE — MAU Provider Note (Signed)
History     CSN: [id number]  Arrival date and time: 07/14/18 0101   First Provider Initiated Contact with Patient 07/14/18 0126      Chief Complaint  Patient presents with  . Decreased Fetal Movement   HPI Tara Mcmahon is a 22 y.o. [id number] at 9017w1d who presents with decreased fetal movement. She states the baby has been moving less than normal today. She reports counting kicks and only having 7 in 3-4 hours which made her concerned. She attempted eating, drinking and position changes with no improvement. She is feeling movement in MAU, it is just less than normal. She denies any pain, vaginal bleeding or leaking of fluid.   OB History    Gravida  4   Para  2   Term  1   Preterm  1   AB  1   Living  2     SAB  1   TAB      Ectopic      Multiple  0   Live Births  2           Past Medical History:  Diagnosis Date  . Anxiety   . Cervical incompetence during pregnancy   . Depression   . Hypothyroidism   . PCOS (polycystic ovarian syndrome)     Past Surgical History:  Procedure Laterality Date  . CERVICAL CERCLAGE    . CERVICAL CERCLAGE N/A 03/09/2016   [facility];  Surgeon: Lazaro ArmsLuther H Eure, MD;  Location: [facility];  Service: Gynecology;  Laterality: N/A;  . CERVICAL CERCLAGE N/A 01/31/2018   Procedure: CERCLAGE CERVICAL;  Surgeon: Lazaro ArmsEure, Luther H, MD;  Location: [facility];  Service: Gynecology;  Laterality: N/A;  . labial reduction      Family History  Problem Relation Age of Onset  . Arthritis Mother   . Lupus Mother   . Autoimmune disease Mother   . Crohn's disease Father   . Cancer Maternal Grandmother        breast  . Heart disease Maternal Grandfather   . Heart disease Paternal Grandfather   . Hypertension Paternal Grandfather   . Lupus Maternal Aunt     Social History   Tobacco Use  . Smoking status: Never Smoker  . Smokeless tobacco: Never Used  Substance Use Topics  . Alcohol use: No    Frequency: Never   Comment: occ; not now  . Drug use: No    Allergies:  Allergies  Allergen Reactions  . Shellfish Allergy     Only Crawfish not allergic to any other shellfish     Medications Prior to Admission  Medication Sig Dispense Refill Last Dose  . aspirin EC 81 MG tablet Take 162 mg by mouth daily.   Not Taking  . diphenhydrAMINE (BENADRYL) 25 MG tablet Take 1 tablet (25 mg total) by mouth at bedtime as needed for itching. 15 tablet 1 Taking  . Prenatal MV & Min w/FA-DHA (PRENATAL ADULT GUMMY/DHA/FA PO) Take 2 tablets by mouth daily.   Taking  . sertraline (ZOLOFT) 25 MG tablet Take 1 tablet (25 mg total) by mouth daily. 30 tablet 11 Taking    Review of Systems  Constitutional: Negative.  Negative for fatigue and fever.  HENT: Negative.   Respiratory: Negative.  Negative for shortness of breath.   Cardiovascular: Negative.  Negative for chest pain.  Gastrointestinal: Negative.  Negative for abdominal pain, constipation, diarrhea, nausea and vomiting.  Genitourinary: Negative.  Negative for dysuria.  Neurological:  Negative.  Negative for dizziness and headaches.   Physical Exam   Blood pressure 137/77, pulse (!) 103, temperature 98.3 F (36.8 C), resp. rate 18, height 4\' 11"  (1.499 m), weight 96.2 kg, last menstrual period 10/12/2017, not currently breastfeeding.  Physical Exam  Nursing note and vitals reviewed. Constitutional: She is oriented to person, place, and time. She appears well-developed and well-nourished. No distress.  HENT:  Head: Normocephalic.  Eyes: Pupils are equal, round, and reactive to light.  Cardiovascular: Normal rate, regular rhythm and normal heart sounds.  Respiratory: Effort normal and breath sounds normal. No respiratory distress.  GI: Soft. Bowel sounds are normal. She exhibits no distension. There is no tenderness.  Neurological: She is alert and oriented to person, place, and time.  Skin: Skin is warm and dry.  Psychiatric: She has a normal mood and  affect. Her behavior is normal. Judgment and thought content normal.    Fetal Tracing:  Baseline: 125 Variability: moderate Accels: 15x15 Decels: none  Toco: ui   MAU Course  Procedures  MDM NST- reactive Patient reports feeling normal fetal movement since being on the monitor.  Assessment and Plan   1. Decreased fetal movements in third trimester, single or unspecified fetus   2. Rubella non-immune status, antepartum   3. Encounter for supervision of other normal pregnancy in third trimester   4. [redacted] weeks gestation of pregnancy    -Discharge home in stable condition -Fetal kick counts precautions discussed -[facility] -Patient may return to MAU as needed or if her condition were to change or worsen   Rolm BookbinderCaroline M Enyla Lisbon CNM 07/14/2018, 1:26 AM

## 2018-07-14 NOTE — Progress Notes (Signed)
Baby moved when EFM applied

## 2018-07-14 NOTE — Progress Notes (Signed)
Cleone Slimaroline Neill CNM in to discuss d/c plan with pt and significant other. Written and verbal d/c instructions given and understanding voiced.

## 2018-07-17 ENCOUNTER — Inpatient Hospital Stay (HOSPITAL_COMMUNITY)
Admission: AD | Admit: 2018-07-17 | Discharge: 2018-07-21 | DRG: 805 | Disposition: A | Discharge status: Home / Self Care | Payer: Medicaid Other | Attending: Obstetrics and Gynecology | Admitting: Obstetrics and Gynecology

## 2018-07-17 ENCOUNTER — Other Ambulatory Visit: Payer: Self-pay

## 2018-07-17 ENCOUNTER — Encounter (HOSPITAL_COMMUNITY): Payer: Self-pay

## 2018-07-17 DIAGNOSIS — O09299 Supervision of pregnancy with other poor reproductive or obstetric history, unspecified trimester: Secondary | ICD-10-CM

## 2018-07-17 DIAGNOSIS — F418 Other specified anxiety disorders: Secondary | ICD-10-CM | POA: Diagnosis present

## 2018-07-17 DIAGNOSIS — O1414 Severe pre-eclampsia complicating childbirth: Secondary | ICD-10-CM | POA: Diagnosis not present

## 2018-07-17 DIAGNOSIS — O134 Gestational [pregnancy-induced] hypertension without significant proteinuria, complicating childbirth: Principal | ICD-10-CM | POA: Diagnosis present

## 2018-07-17 DIAGNOSIS — O411230 Chorioamnionitis, third trimester, not applicable or unspecified: Secondary | ICD-10-CM | POA: Diagnosis present

## 2018-07-17 DIAGNOSIS — O99344 Other mental disorders complicating childbirth: Secondary | ICD-10-CM | POA: Diagnosis present

## 2018-07-17 DIAGNOSIS — O139 Gestational [pregnancy-induced] hypertension without significant proteinuria, unspecified trimester: Secondary | ICD-10-CM | POA: Diagnosis present

## 2018-07-17 DIAGNOSIS — O09899 Supervision of other high risk pregnancies, unspecified trimester: Secondary | ICD-10-CM

## 2018-07-17 DIAGNOSIS — O99214 Obesity complicating childbirth: Secondary | ICD-10-CM | POA: Diagnosis present

## 2018-07-17 DIAGNOSIS — O09219 Supervision of pregnancy with history of pre-term labor, unspecified trimester: Secondary | ICD-10-CM

## 2018-07-17 DIAGNOSIS — Z3A38 38 weeks gestation of pregnancy: Secondary | ICD-10-CM

## 2018-07-17 DIAGNOSIS — D649 Anemia, unspecified: Secondary | ICD-10-CM | POA: Diagnosis present

## 2018-07-17 DIAGNOSIS — O99891 Other specified diseases and conditions complicating pregnancy: Secondary | ICD-10-CM

## 2018-07-17 DIAGNOSIS — O9902 Anemia complicating childbirth: Secondary | ICD-10-CM | POA: Diagnosis present

## 2018-07-17 DIAGNOSIS — O1493 Unspecified pre-eclampsia, third trimester: Secondary | ICD-10-CM

## 2018-07-17 DIAGNOSIS — Z2839 Other underimmunization status: Secondary | ICD-10-CM

## 2018-07-17 DIAGNOSIS — Z283 Underimmunization status: Secondary | ICD-10-CM

## 2018-07-17 DIAGNOSIS — O9989 Other specified diseases and conditions complicating pregnancy, childbirth and the puerperium: Secondary | ICD-10-CM

## 2018-07-17 DIAGNOSIS — O1002 Pre-existing essential hypertension complicating childbirth: Secondary | ICD-10-CM | POA: Diagnosis not present

## 2018-07-17 HISTORY — DX: Gestational (pregnancy-induced) hypertension without significant proteinuria, unspecified trimester: O13.9

## 2018-07-17 LAB — URINALYSIS, ROUTINE W REFLEX MICROSCOPIC
Bilirubin Urine: NEGATIVE
Glucose, UA: NEGATIVE mg/dL
Hgb urine dipstick: NEGATIVE
Ketones, ur: 5 mg/dL — AB
Nitrite: NEGATIVE
Protein, ur: NEGATIVE mg/dL
Specific Gravity, Urine: 1.013 (ref 1.005–1.030)
pH: 7 (ref 5.0–8.0)

## 2018-07-17 LAB — COMPREHENSIVE METABOLIC PANEL
ALT: 48 U/L — ABNORMAL HIGH (ref 0–44)
AST: 32 U/L (ref 15–41)
Albumin: 2.8 g/dL — ABNORMAL LOW (ref 3.5–5.0)
Alkaline Phosphatase: 202 U/L — ABNORMAL HIGH (ref 38–126)
Anion gap: 11 (ref 5–15)
BUN: 7 mg/dL (ref 6–20)
CO2: 19 mmol/L — ABNORMAL LOW (ref 22–32)
Calcium: 8.7 mg/dL — ABNORMAL LOW (ref 8.9–10.3)
Chloride: 102 mmol/L (ref 98–111)
Creatinine, Ser: 0.61 mg/dL (ref 0.44–1.00)
GFR calc Af Amer: >60 mL/min (ref >60)
GFR calc non Af Amer: >60 mL/min (ref >60)
Glucose, Bld: 80 mg/dL (ref 70–99)
Potassium: 3.9 mmol/L (ref 3.5–5.1)
Sodium: 132 mmol/L — ABNORMAL LOW (ref 135–145)
Total Bilirubin: 0.5 mg/dL (ref 0.3–1.2)
Total Protein: 6.4 g/dL — ABNORMAL LOW (ref 6.5–8.1)

## 2018-07-17 LAB — CBC
HCT: 35.1 % — ABNORMAL LOW (ref 36.0–46.0)
Hemoglobin: 11.6 g/dL — ABNORMAL LOW (ref 12.0–15.0)
MCH: 27.9 pg (ref 26.0–34.0)
MCHC: 33 g/dL (ref 30.0–36.0)
MCV: 84.4 fL (ref 78.0–100.0)
Platelets: 289 10*3/uL (ref 150–400)
RBC: 4.16 MIL/uL (ref 3.87–5.11)
RDW: 13.4 % (ref 11.5–15.5)
WBC: 14.3 10*3/uL — ABNORMAL HIGH (ref 4.0–10.5)

## 2018-07-17 LAB — TYPE AND SCREEN
ABO/RH(D): O POS
ANTIBODY SCREEN: NEGATIVE

## 2018-07-17 LAB — PROTEIN / CREATININE RATIO, URINE
CREATININE, URINE: 86 mg/dL
PROTEIN CREATININE RATIO: 0.15 mg/mg{creat} (ref 0.00–0.15)
TOTAL PROTEIN, URINE: 13 mg/dL

## 2018-07-17 MED ORDER — ONDANSETRON HCL 4 MG/2ML IJ SOLN
4.0000 mg | Freq: Four times a day (QID) | INTRAMUSCULAR | Status: DC | PRN
  Administered 2018-07-17: 4 mg via INTRAVENOUS
  Filled 2018-07-17: qty 2

## 2018-07-17 MED ORDER — MAGNESIUM SULFATE 40 G IN LACTATED RINGERS - SIMPLE
2.0000 g/h | INTRAVENOUS | Status: DC
  Administered 2018-07-18: 2 g/h via INTRAVENOUS
  Filled 2018-07-17: qty 500

## 2018-07-17 MED ORDER — FLEET ENEMA 7-19 GM/118ML RE ENEM: 1.0000 | ENEMA | RECTAL | Status: DC | PRN

## 2018-07-17 MED ORDER — LIDOCAINE HCL (PF) 1 % IJ SOLN
30.0000 mL | INTRAMUSCULAR | Status: AC | PRN
  Administered 2018-07-18: 5 mL via SUBCUTANEOUS
  Administered 2018-07-18: 7 mL via SUBCUTANEOUS

## 2018-07-17 MED ORDER — LACTATED RINGERS IV SOLN
500.0000 mL | INTRAVENOUS | Status: DC | PRN
  Administered 2018-07-19 (×2): 500 mL via INTRAVENOUS

## 2018-07-17 MED ORDER — LACTATED RINGERS IV SOLN
INTRAVENOUS | Status: DC
  Administered 2018-07-17 – 2018-07-19 (×7): via INTRAVENOUS

## 2018-07-17 MED ORDER — OXYTOCIN BOLUS FROM INFUSION
500.0000 mL | Freq: Once | INTRAVENOUS | Status: AC
  Administered 2018-07-19: 500 mL via INTRAVENOUS

## 2018-07-17 MED ORDER — OXYTOCIN 40 UNITS IN LACTATED RINGERS INFUSION - SIMPLE MED: 2.5000 [IU]/h | INTRAVENOUS | Status: DC

## 2018-07-17 MED ORDER — ACETAMINOPHEN 500 MG PO TABS
1000.0000 mg | ORAL_TABLET | Freq: Once | ORAL | Status: AC
  Administered 2018-07-17: 1000 mg via ORAL
  Filled 2018-07-17: qty 2

## 2018-07-17 MED ORDER — MAGNESIUM SULFATE BOLUS VIA INFUSION
4.0000 g | Freq: Once | INTRAVENOUS | Status: AC
  Administered 2018-07-17: 4 g via INTRAVENOUS
  Filled 2018-07-17: qty 500

## 2018-07-17 MED ORDER — SOD CITRATE-CITRIC ACID 500-334 MG/5ML PO SOLN: 30.0000 mL | ORAL | Status: DC | PRN

## 2018-07-17 MED ORDER — OXYCODONE-ACETAMINOPHEN 5-325 MG PO TABS: 1.0000 | ORAL_TABLET | ORAL | Status: DC | PRN

## 2018-07-17 MED ORDER — ACETAMINOPHEN 325 MG PO TABS
650.0000 mg | ORAL_TABLET | ORAL | Status: DC | PRN
  Administered 2018-07-18 – 2018-07-19 (×2): 650 mg via ORAL
  Filled 2018-07-17 (×2): qty 2

## 2018-07-17 MED ORDER — OXYCODONE-ACETAMINOPHEN 5-325 MG PO TABS: 2.0000 | ORAL_TABLET | ORAL | Status: DC | PRN

## 2018-07-17 NOTE — MAU Note (Signed)
Pt reports she has had headache and blurred vision for the last 3 hours b/p at home 131/94

## 2018-07-17 NOTE — MAU Note (Signed)
Checked the difference between the hospital monitors and the pt's home monitor per Judeth HornErin Lawrence NP request.  There appears to be a significant difference between the readings.  At 1827 the pt's monitor read BP of 140/85 HR 112, at 1831 hospital monitor read 102/56 HR 121, and at 1833 the pt's monitor read 116/92 HR 126. Judeth HornErin Lawrence NP notified of the readings.

## 2018-07-17 NOTE — MAU Provider Note (Signed)
Chief Complaint  Patient presents with  . Headache  . Hypertension     First Provider Initiated Contact with Patient 07/17/18 1647      S: Tara Mcmahon  is a 22 y.o. y.o. year old [phone] female at 5384w4d weeks gestation who presents to MAU with elevated blood pressures. Has hx of GHTN in last pregnancy. Current blood pressure medication: none States she was told by her ob that her BPs were creeping up so they recommended her checking her BPs at home twice daily. States her DBP is normally in the 80s. Checked her BP more frequently today d/t onset of headache & blurred vision. BPs 130s/90s. Reports temporal headache that she rates 5/10. Has not treated pain. Nothing makes better or worse. Has seen some doubling of vision. Nauseated today. Vomited last night.   Associated symptoms: + Headache, + vision changes, No epigastric pain Contractions: none Vaginal bleeding: none Fetal movement: normal  O:  Patient Vitals for the past 24 hrs:  BP Temp Temp src Pulse Resp SpO2 Height Weight  07/17/18 1800 115/73 - - (!) 115 - - - -  07/17/18 1746 114/85 - - (!) 106 - - - -  07/17/18 1731 119/78 - - (!) 120 - - - -  07/17/18 1716 121/80 - - (!) 122 - - - -  07/17/18 1700 120/76 - - (!) 121 - - - -  07/17/18 1646 124/77 - - (!) 126 18 - - -  07/17/18 1631 116/69 - - (!) 124 18 - - -  07/17/18 1629 125/80 - - (!) 137 18 - - -  07/17/18 1615 137/87 98.1 F (36.7 C) Oral (!) 113 18 100 % 4\' 11"  (1.499 m) 94.3 kg   General: NAD Heart: Regular rate Lungs: Normal rate and effort Abd: Soft, NT, Gravid, S=D Extremities: Non pitting Pedal edema Neuro: 2+ deep tendon reflexes, No clonus Pelvic: NEFG, no bleeding or LOF.   Dilation: 3 Effacement (%): 50 Station: Ballotable Presentation: Vertex Exam by:: Judeth HornErin Hildreth Robart NP  NST:  Baseline: 150 bpm, Variability: Good {> 6 bpm), Accelerations: Reactive and Decelerations: Absent  Results for orders placed or performed during the hospital  encounter of 07/17/18 (from the past 24 hour(s))  Urinalysis, Routine w reflex microscopic     Status: Abnormal   Collection Time: 07/17/18  4:59 PM  Result Value Ref Range   Color, Urine YELLOW YELLOW   APPearance HAZY (A) CLEAR   Specific Gravity, Urine 1.013 1.005 - 1.030   pH 7.0 5.0 - 8.0   Glucose, UA NEGATIVE NEGATIVE mg/dL   Hgb urine dipstick NEGATIVE NEGATIVE   Bilirubin Urine NEGATIVE NEGATIVE   Ketones, ur 5 (A) NEGATIVE mg/dL   Protein, ur NEGATIVE NEGATIVE mg/dL   Nitrite NEGATIVE NEGATIVE   Leukocytes, UA TRACE (A) NEGATIVE   RBC / HPF 0-5 0 - 5 RBC/hpf   WBC, UA 0-5 0 - 5 WBC/hpf   Bacteria, UA RARE (A) NONE SEEN   Squamous Epithelial / LPF 11-20 0 - 5   Mucus PRESENT    Non Squamous Epithelial 0-5 (A) NONE SEEN  Protein / creatinine ratio, urine     Status: None   Collection Time: 07/17/18  4:59 PM  Result Value Ref Range   Creatinine, Urine 86.00 mg/dL   Total Protein, Urine 13 mg/dL   Protein Creatinine Ratio 0.15 0.00 - 0.15 mg/mg[Cre]  CBC     Status: Abnormal   Collection Time: 07/17/18  5:04 PM  Result  Value Ref Range   WBC 14.3 (H) 4.0 - 10.5 K/uL   RBC 4.16 3.87 - 5.11 MIL/uL   Hemoglobin 11.6 (L) 12.0 - 15.0 g/dL   HCT 95.635.1 (L) 21.336.0 - 08.646.0 %   MCV 84.4 78.0 - 100.0 fL   MCH 27.9 26.0 - 34.0 pg   MCHC 33.0 30.0 - 36.0 g/dL   RDW 57.813.4 46.911.5 - 62.915.5 %   Platelets 289 150 - 400 K/uL  Comprehensive metabolic panel     Status: Abnormal   Collection Time: 07/17/18  5:04 PM  Result Value Ref Range   Sodium 132 (L) 135 - 145 mmol/L   Potassium 3.9 3.5 - 5.1 mmol/L   Chloride 102 98 - 111 mmol/L   CO2 19 (L) 22 - 32 mmol/L   Glucose, Bld 80 70 - 99 mg/dL   BUN 7 6 - 20 mg/dL   Creatinine, Ser 5.280.61 0.44 - 1.00 mg/dL   Calcium 8.7 (L) 8.9 - 10.3 mg/dL   Total Protein 6.4 (L) 6.5 - 8.1 g/dL   Albumin 2.8 (L) 3.5 - 5.0 g/dL   AST 32 15 - 41 U/L   ALT 48 (H) 0 - 44 U/L   Alkaline Phosphatase 202 (H) 38 - 126 U/L   Total Bilirubin 0.5 0.3 - 1.2 mg/dL    GFR calc non Af Amer >60 >60 mL/min   GFR calc Af Amer >60 >60 mL/min   Anion gap 11 5 - 15    A: 6038w4d week IUP Preeclampsia- elevated ALT, elevated BPs on patient's home cuff, & h/a persistent with treatment; discussed results with Dr. Jolayne Pantheronstant, will admit for IOL.   P:  Admit to birthing suites for IOL r/t preeclampsia  Judeth HornLawrence, Chesky Heyer, NP 07/17/2018 6:55 PM

## 2018-07-17 NOTE — Progress Notes (Signed)
On review of patient's chart, patient has preeclampsia with severe features secondary to persistent headache and blurry vision. HELLP labs wnl aside from slightly elevated ALT but not 2x ULN. BP here has been normal but elevated BP on home cuff. Given diagnosis of preeclampsia based on symptoms, indication to be on magnesium. Will start in MAU. Awaiting bed on labor to be able to continue patient's induction with pitocin.

## 2018-07-17 NOTE — Progress Notes (Signed)
[facility]: Labor Progress Note  Subjective: Patient with persistent mild headache s/p tylenol, no more visual changes, says she's "always tachycardic".  She feels contractions regularly ~177min but they are not showing on toco.  Objective: BP 115/73 (BP Location: Right Arm)   Pulse (!) 115   Temp 98.1 F (36.7 C) (Oral)   Resp 18   Ht 4\' 11"  (1.499 m)   Wt 94.3 kg   LMP 10/12/2017 (Approximate)   SpO2 100%   BMI 42.01 kg/m  Gen: well appearing, pleasant in no distress Dilation: 3 Effacement (%): 50 Station: Ballotable Presentation: Vertex Exam by:: Latricia HeftAnna Cioce, RN  Assessment and Plan: 22 y.o. [phone] 454w4d being admitted for IOL due to concern of potential pre-eclampsia due to slightly elevated liver enzymes and hx pre-eclampsia and past claimed   Labor: plan to IOL -- pain control: would like to try nitrous and avoid epidural  Fetal Well-Being: Cephalic by US.  -- Category 1 - FHR ~150 with appropriate variation and accelerations, fetal monitoring  -- GBS (neg)   Marthenia RollingScott Corryn Madewell, DO

## 2018-07-17 NOTE — Progress Notes (Signed)
Pt informed that the ultrasound is considered a limited OB ultrasound and is not intended to be a complete ultrasound exam.  Patient also informed that the ultrasound is not being completed with the intent of assessing for fetal or placental anomalies or any pelvic abnormalities.  Explained that the purpose of today's ultrasound is to assess for  presentation.  Patient acknowledges the purpose of the exam and the limitations of the study.    VERTEX Thressa ShellerHeather Jovon Winterhalter 10:41 PM 07/17/18

## 2018-07-17 NOTE — H&P (Signed)
Tara Mcmahon is a 22 y.o. female presenting for IOL for Preeclampsia. History is significant for SVD at 26 weeks in first pregnancy and IOL at term for poor fetal dopplers. Patient endorsees gHTN in previous pregnancy and states [facility] that her BPs were "creeping up". Denies RUQ pain, headache, visual disturbances. [facility]  Patient has PCOS (polycystic ovarian syndrome); Depression with anxiety; Family history of systemic lupus erythematosus (SLE) in mother; History of suicidal ideation; Subclinical hypothyroidism; Gestational hypertension; Fever; Breast pain; History of PCOS; Dysmenorrhea; Menorrhagia with irregular cycle; History of cervical incompetence in pregnancy, currently pregnant; History of preterm delivery, currently pregnant; Abdominal cramps; Grief reaction; Supervision of normal pregnancy; Rubella non-immune status, antepartum; and Preeclampsia, third trimester on their problem list.   OB History    Gravida  4   Para  2   Term  1   Preterm  1   AB  1   Living  2     SAB  1   TAB      Ectopic      Multiple  0   Live Births  2          Past Medical History:  Diagnosis Date  . Anxiety   . Cervical incompetence during pregnancy   . Depression   . Hypothyroidism   . PCOS (polycystic ovarian syndrome)   . Pregnancy induced hypertension    With second pregnancy   Past Surgical History:  Procedure Laterality Date  . CERVICAL CERCLAGE    . CERVICAL CERCLAGE N/A 03/09/2016   [facility];  Surgeon: Lazaro ArmsLuther H Eure, MD;  Location: [facility];  Service: Gynecology;  Laterality: N/A;  . CERVICAL CERCLAGE N/A 01/31/2018   Procedure: CERCLAGE CERVICAL;  Surgeon: Lazaro ArmsEure, Luther H, MD;  Location: [facility];  Service: Gynecology;  Laterality: N/A;  . CERVICAL CERCLAGE     Placed in 2013, 2017, and 2019  . labial reduction     Family History: family history includes Arthritis in her mother;  Autoimmune disease in her mother; Cancer in her maternal grandmother; Crohn's disease in her mother; Heart disease in her maternal grandfather and paternal grandfather; Hypertension in her paternal grandfather; Lupus in her maternal aunt and mother. Social History:  reports that she has never smoked. She has never used smokeless tobacco. She reports that she does not drink alcohol or use drugs.     Maternal Diabetes: No Genetic Screening: Declined Maternal Ultrasounds/Referrals: Declined Fetal Ultrasounds or other Referrals:  None Maternal Substance Abuse:  No Significant Maternal Medications:  None Significant Maternal Lab Results:  None Other Comments:  None  Review of Systems  Constitutional: Negative for fever.  Eyes: Negative for blurred vision and photophobia.  Respiratory: Negative for shortness of breath.   Gastrointestinal: Negative for nausea and vomiting.  Neurological: Negative for dizziness and headaches.  All other systems reviewed and are negative.  Maternal Medical History:  Reason for admission: Nausea.    Dilation: 3 Effacement (%): 50 Station: Ballotable Exam by:: Judeth HornErin Lawrence NP Blood pressure 115/73, pulse (!) 115, temperature 98.1 F (36.7 C), temperature source Oral, resp. rate 18, height 4\' 11"  (1.499 m), weight 94.3 kg, last menstrual period 10/12/2017, SpO2 100 %, not currently breastfeeding. Exam Physical Exam  Vitals reviewed. Constitutional: She is oriented to person, place, and time. She appears well-developed and well-nourished.  Cardiovascular: Normal rate and regular rhythm.  Respiratory: Effort normal.  GI:  Gravid No RUQ  tenderness   Neurological: She is alert and oriented to person, place, and time. She has normal reflexes.  Skin: Skin is warm and dry.  Psychiatric: She has a normal mood and affect. Her behavior is normal. Judgment and thought content normal.    Prenatal labs: ABO, Rh:   Antibody: Negative (01/16 1226) Rubella:  <0.90 (01/16 1226) RPR: Non Reactive (01/16 1226)  HBsAg: Negative (01/16 1226)  HIV: Non Reactive (01/16 1226)  GBS: Negative (08/02 1215)   Assessment/Plan: --22 y.o. [id number] at 6143w4d  --IOL for Preeclampsia --Normotensive, elevated ALT, elevated pressures at home, headache --Reactive fetal tracing: baseline 155, moderate variability, positive accelerations, no decelerations --Toco: irregular mild contractions, not felt by patient --Admit to BS, plan agumentation with Pitocin PRN. Discussed with patient --Desires unmedicated delivery,   Girl/breast/PoP+ partner vasectomy.   Calvert CantorSamantha C Mark Hassey, CNM 07/17/2018, 7:31 PM

## 2018-07-18 ENCOUNTER — Encounter (HOSPITAL_COMMUNITY): Payer: Self-pay

## 2018-07-18 ENCOUNTER — Inpatient Hospital Stay (HOSPITAL_COMMUNITY): Payer: Medicaid Other | Admitting: Anesthesiology

## 2018-07-18 LAB — RPR: RPR: NONREACTIVE

## 2018-07-18 MED ORDER — DIPHENHYDRAMINE HCL 50 MG/ML IJ SOLN
12.5000 mg | INTRAMUSCULAR | Status: DC | PRN
  Administered 2018-07-19: 12.5 mg via INTRAVENOUS
  Filled 2018-07-18: qty 1

## 2018-07-18 MED ORDER — FENTANYL CITRATE (PF) 100 MCG/2ML IJ SOLN
100.0000 ug | INTRAMUSCULAR | Status: DC | PRN
  Administered 2018-07-18: 100 ug via INTRAVENOUS
  Filled 2018-07-18: qty 2

## 2018-07-18 MED ORDER — DIPHENHYDRAMINE HCL 50 MG/ML IJ SOLN
25.0000 mg | Freq: Once | INTRAMUSCULAR | Status: AC
  Administered 2018-07-18: 25 mg via INTRAVENOUS
  Filled 2018-07-18: qty 1

## 2018-07-18 MED ORDER — FENTANYL 2.5 MCG/ML BUPIVACAINE 1/10 % EPIDURAL INFUSION (WH - ANES)
14.0000 mL/h | INTRAMUSCULAR | Status: DC | PRN
  Administered 2018-07-18 – 2018-07-19 (×2): 14 mL/h via EPIDURAL
  Filled 2018-07-18 (×2): qty 100

## 2018-07-18 MED ORDER — EPHEDRINE 5 MG/ML INJ
10.0000 mg | INTRAVENOUS | Status: DC | PRN
  Filled 2018-07-18: qty 2

## 2018-07-18 MED ORDER — PHENYLEPHRINE 40 MCG/ML (10ML) SYRINGE FOR IV PUSH (FOR BLOOD PRESSURE SUPPORT)
80.0000 ug | PREFILLED_SYRINGE | INTRAVENOUS | Status: DC | PRN
  Filled 2018-07-18: qty 5
  Filled 2018-07-18: qty 10

## 2018-07-18 MED ORDER — DEXAMETHASONE SODIUM PHOSPHATE 10 MG/ML IJ SOLN
10.0000 mg | Freq: Once | INTRAMUSCULAR | Status: AC
  Administered 2018-07-18: 10 mg via INTRAVENOUS
  Filled 2018-07-18: qty 1

## 2018-07-18 MED ORDER — TERBUTALINE SULFATE 1 MG/ML IJ SOLN
0.2500 mg | Freq: Once | INTRAMUSCULAR | Status: DC | PRN
  Filled 2018-07-18: qty 1

## 2018-07-18 MED ORDER — MISOPROSTOL 25 MCG QUARTER TABLET
25.0000 ug | ORAL_TABLET | ORAL | Status: DC
  Filled 2018-07-18 (×6): qty 1

## 2018-07-18 MED ORDER — CALCIUM CARBONATE ANTACID 500 MG PO CHEW: 2.0000 | CHEWABLE_TABLET | Freq: Two times a day (BID) | ORAL | Status: DC

## 2018-07-18 MED ORDER — OXYTOCIN 40 UNITS IN LACTATED RINGERS INFUSION - SIMPLE MED: 1.0000 m[IU]/min | INTRAVENOUS | Status: DC

## 2018-07-18 MED ORDER — CALCIUM CARBONATE ANTACID 500 MG PO CHEW
400.0000 mg | CHEWABLE_TABLET | Freq: Once | ORAL | Status: AC
  Administered 2018-07-18: 400 mg via ORAL
  Filled 2018-07-18: qty 2

## 2018-07-18 MED ORDER — OXYTOCIN 40 UNITS IN LACTATED RINGERS INFUSION - SIMPLE MED
1.0000 m[IU]/min | INTRAVENOUS | Status: DC
  Administered 2018-07-18: 1 m[IU]/min via INTRAVENOUS
  Filled 2018-07-18: qty 1000

## 2018-07-18 MED ORDER — OXYTOCIN 40 UNITS IN LACTATED RINGERS INFUSION - SIMPLE MED
1.0000 m[IU]/min | INTRAVENOUS | Status: DC
  Administered 2018-07-18: 2 m[IU]/min via INTRAVENOUS
  Administered 2018-07-18: 14 m[IU]/min via INTRAVENOUS

## 2018-07-18 MED ORDER — CALCIUM CARBONATE ANTACID 500 MG PO CHEW
2.0000 | CHEWABLE_TABLET | Freq: Two times a day (BID) | ORAL | Status: DC | PRN
  Administered 2018-07-18 – 2018-07-19 (×2): 400 mg via ORAL
  Administered 2018-07-19: 1000 mg via ORAL
  Filled 2018-07-18 (×3): qty 2

## 2018-07-18 MED ORDER — METOCLOPRAMIDE HCL 5 MG/ML IJ SOLN
10.0000 mg | Freq: Once | INTRAMUSCULAR | Status: AC
  Administered 2018-07-18: 10 mg via INTRAVENOUS
  Filled 2018-07-18: qty 2

## 2018-07-18 MED ORDER — LACTATED RINGERS IV SOLN
500.0000 mL | Freq: Once | INTRAVENOUS | Status: AC
  Administered 2018-07-18: 500 mL via INTRAVENOUS

## 2018-07-18 MED ORDER — PHENYLEPHRINE 40 MCG/ML (10ML) SYRINGE FOR IV PUSH (FOR BLOOD PRESSURE SUPPORT)
80.0000 ug | PREFILLED_SYRINGE | INTRAVENOUS | Status: DC | PRN
  Filled 2018-07-18: qty 5

## 2018-07-18 NOTE — Progress Notes (Signed)
Pt given 25 benedryl, 10 regalan,10 of decadron IVP

## 2018-07-18 NOTE — Progress Notes (Signed)
LABOR PROGRESS NOTE  Tara Mcmahon is a 22 y.o. [id number] at 5018w5d  admitted for IOL for severe PEC on Mag. Reviewed record and patient has never had elevated blood pressures in the hospital and MAU calibration with home BP cuff and hospital showed discrepant values. Patient's BP values at home were mild range (130s/90) and when MAU BP was taken right after home BP cuff was used values were as follows  Home Cuff  MAU cuff 140/85 (18:31)  102/56 (18:31) 116/92  (18:33)  Patient was never given HA cocktail to see if HA alleviated.  She has only received tylenol which reduced the HA from 2/10 to 1/10  Subjective: Continues to have low level HA, rates 1/10  Objective: BP 106/63   Pulse 85   Temp 97.9 F (36.6 C) (Oral)   Resp 16   Ht 4\' 11"  (1.499 m)   Wt 94.3 kg   LMP 10/12/2017 (Approximate)   SpO2 100%   BMI 41.99 kg/m  or  Vitals:   07/18/18 1300 07/18/18 1336 07/18/18 1409 07/18/18 1433  BP: 114/65 125/74 111/71 106/63  Pulse: (!) 105 (!) 102 (!) 108 85  Resp: 18 18 16 16   Temp:      TempSrc:      SpO2:      Weight:      Height:        Dilation: 2.5 Effacement (%): 60 Cervical Position: Middle Station: -3 Presentation: Vertex Exam by:: Tara Mcmahon CNM  FHT: baseline rate 120, moderate varibility, no accels, no decel Toco: 2-7   Labs: Lab Results  Component Value Date   WBC 14.3 (H) 07/17/2018   HGB 11.6 (L) 07/17/2018   HCT 35.1 (L) 07/17/2018   MCV 84.4 07/17/2018   PLT 289 07/17/2018    Patient Active Problem List   Diagnosis Date Noted  . Preeclampsia, third trimester 07/17/2018  . Rubella non-immune status, antepartum 12/14/2017  . Supervision of normal pregnancy 12/13/2017  . History of cervical incompetence in pregnancy, currently pregnant 11/23/2017  . History of preterm delivery, currently pregnant 11/23/2017  . History of PCOS 06/23/2017  . Gestational hypertension 08/29/2016  . Subclinical hypothyroidism 02/04/2016  . History of suicidal  ideation 02/03/2016  . PCOS (polycystic ovarian syndrome) 01/27/2016  . Depression with anxiety 01/27/2016  . Family history of systemic lupus erythematosus (SLE) in mother 01/27/2016    Assessment / Plan: 22 y.o. [id number] at 5718w5d here for IOL for severe PEC but it appears that patient does not truly meet criteria for severe PEC. BP here has not been elevated and did not receive adequate HA treatment initially.  Given early labor based on cervical exam and not truly meeting severe PEC criteria I am going to stop magnesium and try adequate HA treatment.  #Headache in pregnancy: will treat with Reglan, Decadron, Benadryl. Reassess HA after treatment  #Concern for PEC, most likely gHTN: Monitor BP off magnesium. If > 140/90 and no symptoms then patient meets criteria for gHTN. AST is elevated slightly at 48 but does not meet criteria for severe features based on LFT elevation. Additionally, patient had no baseline LFTS this pregnancy and given morbid obesity this is more difficult to  Interpret this mild elevation.   Labor: Recheck of cervix showed she is 3 externally and 1 internally. Foley to be placed and plan for cytotec. Allow patient to eat Fetal Wellbeing:  Cat I  Pain Control:  Plans unmedicated delivery  Anticipated MOD:  SVD  Federico FlakeNewton, [name],  MD 07/18/2018, 2:58 PM

## 2018-07-18 NOTE — Anesthesia Preprocedure Evaluation (Signed)
Anesthesia Evaluation  Patient identified by MRN, date of birth, ID band Patient awake    Reviewed: Allergy & Precautions, H&P , NPO status , Patient's Chart, lab work & pertinent test results  Airway Mallampati: II  TM Distance: >3 FB Neck ROM: full    Dental no notable dental hx. (+) Teeth Intact   Pulmonary neg pulmonary ROS,    Pulmonary exam normal breath sounds clear to auscultation       Cardiovascular hypertension, negative cardio ROS Normal cardiovascular exam Rhythm:regular Rate:Normal     Neuro/Psych PSYCHIATRIC DISORDERS Anxiety Depression negative neurological ROS  negative psych ROS   GI/Hepatic negative GI ROS, Neg liver ROS,   Endo/Other  Morbid obesity  Renal/GU negative Renal ROS     Musculoskeletal   Abdominal (+) + obese,   Peds  Hematology negative hematology ROS (+)   Anesthesia Other Findings   Reproductive/Obstetrics (+) Pregnancy                             Anesthesia Physical Anesthesia Plan  ASA: III  Anesthesia Plan: Epidural   Post-op Pain Management:    Induction:   PONV Risk Score and Plan:   Airway Management Planned:   Additional Equipment:   Intra-op Plan:   Post-operative Plan:   Informed Consent: I have reviewed the patients History and Physical, chart, labs and discussed the procedure including the risks, benefits and alternatives for the proposed anesthesia with the patient or authorized representative who has indicated his/her understanding and acceptance.     Plan Discussed with:   Anesthesia Plan Comments:         Anesthesia Quick Evaluation  

## 2018-07-18 NOTE — Progress Notes (Signed)
LABOR PROGRESS NOTE  Tara Mcmahon is a 22 y.o. [phone] at 4765w5d  admitted for IOL for severe PEC on Mag  Subjective: Patient reports contractions are stronger and she is breathing through contractions   Objective: BP 118/81   Pulse (!) 112   Temp (!) 97.5 F (36.4 C) (Oral)   Resp 18   Ht 4\' 11"  (1.499 m)   Wt 94.3 kg   LMP 10/12/2017 (Approximate)   SpO2 100%   BMI 41.99 kg/m  or  Vitals:   07/18/18 0900 07/18/18 0930 07/18/18 1000 07/18/18 1040  BP: 111/62 (!) 106/55 (!) 109/57 118/81  Pulse: 90 81 100 (!) 112  Resp: 18 18 20 18   Temp:      TempSrc:      SpO2:      Weight:      Height:        Dilation: 3 Effacement (%): 60 Cervical Position: Anterior Station: -3 Presentation: Vertex Exam by:: Aundria Rudogers, CNM FHT: baseline rate 120, moderate varibility, no acel, no decel Toco: 2-7   Labs: Lab Results  Component Value Date   WBC 14.3 (H) 07/17/2018   HGB 11.6 (L) 07/17/2018   HCT 35.1 (L) 07/17/2018   MCV 84.4 07/17/2018   PLT 289 07/17/2018    Patient Active Problem List   Diagnosis Date Noted  . Preeclampsia, third trimester 07/17/2018  . Rubella non-immune status, antepartum 12/14/2017  . Supervision of normal pregnancy 12/13/2017  . Abdominal cramps 11/27/2017  . Grief reaction 11/27/2017  . History of cervical incompetence in pregnancy, currently pregnant 11/23/2017  . History of preterm delivery, currently pregnant 11/23/2017  . History of PCOS 06/23/2017  . Dysmenorrhea 06/23/2017  . Menorrhagia with irregular cycle 06/23/2017  . Fever 09/11/2016  . Breast pain 09/11/2016  . Gestational hypertension 08/29/2016  . Subclinical hypothyroidism 02/04/2016  . History of suicidal ideation 02/03/2016  . PCOS (polycystic ovarian syndrome) 01/27/2016  . Depression with anxiety 01/27/2016  . Family history of systemic lupus erythematosus (SLE) in mother 01/27/2016    Assessment / Plan: 22 y.o. [id number] at 10065w5d here for IOL for severe PEC    Labor: Slow to progress, continue pitocin titration  Fetal Wellbeing:  Cat I  Pain Control:  Plans unmedicated delivery  Anticipated MOD:  SVD  Sharyon CableRogers, Rachella Basden C, CNM 07/18/2018, 11:14 AM

## 2018-07-18 NOTE — Anesthesia Pain Management Evaluation Note (Signed)
  [facility] Pain Management Visit Note  Patient: Tara PealAshleigh A Skilton, 22 y.o., female  "Hello I am a [facility] of the anesthesia team at [facility]. We have an anesthesia team available at all times to provide care throughout the hospital, including epidural management and anesthesia for C-section. I don't know your plan for the delivery whether it a natural birth, water birth, IV sedation, nitrous supplementation, doula or epidural, but we want to meet your pain goals."   1.Was your pain managed to your expectations on prior hospitalizations?   Yes   2.What is your expectation for pain management during this hospitalization?     Labor support without medications  3.How can we help you reach that goal? unsure  Record the patient's initial score and the patient's pain goal.   Pain: 3  Pain Goal: 6 [facility] wants you to be able to say your pain was always managed very well.  Cephus ShellingBURGER,Lavaughn Bisig 07/18/2018

## 2018-07-18 NOTE — Progress Notes (Signed)
LABOR PROGRESS NOTE  Tara Mcmahon is a 22 y.o. [phone] at 7655w5d  admitted for IOL for now diagnosed GHTN instead of PEC diagnoses   Subjective: Patient reports HA being completely relieved after HA cocktail   Objective: BP 134/80   Pulse (!) 113   Temp 98.2 F (36.8 C) (Oral)   Resp 18   Ht 4\' 11"  (1.499 m)   Wt 94.3 kg   LMP 10/12/2017 (Approximate)   SpO2 100%   BMI 41.99 kg/m  or  Vitals:   07/18/18 1728 07/18/18 1800 07/18/18 1830 07/18/18 1855  BP: 123/75 131/75 134/80 134/80  Pulse: (!) 108 (!) 111 (!) 116 (!) 113  Resp: 20   18  Temp:      TempSrc:      SpO2:      Weight:      Height:        FB out at 1520 Dilation: 4.5 Effacement (%): 60 Cervical Position: Middle Station: Ballotable Presentation: Vertex Exam by:: Henderson NewcomerStephanie Faulk FHT: baseline rate 150, moderate varibility, +acel, prolonged decel as patient was in the bathroom  Toco: 3 UC/ mild by palpation   Labs: Lab Results  Component Value Date   WBC 14.3 (H) 07/17/2018   HGB 11.6 (L) 07/17/2018   HCT 35.1 (L) 07/17/2018   MCV 84.4 07/17/2018   PLT 289 07/17/2018    Patient Active Problem List   Diagnosis Date Noted  . Preeclampsia, third trimester 07/17/2018  . Rubella non-immune status, antepartum 12/14/2017  . Supervision of normal pregnancy 12/13/2017  . History of cervical incompetence in pregnancy, currently pregnant 11/23/2017  . History of preterm delivery, currently pregnant 11/23/2017  . History of PCOS 06/23/2017  . Gestational hypertension 08/29/2016  . Subclinical hypothyroidism 02/04/2016  . History of suicidal ideation 02/03/2016  . PCOS (polycystic ovarian syndrome) 01/27/2016  . Depression with anxiety 01/27/2016  . Family history of systemic lupus erythematosus (SLE) in mother 01/27/2016    Assessment / Plan: 22 y.o. [name] at 6755w5d here for IOL for GHTN   Labor: Continue pitocin titration, AROM when baby is not ballotable  Fetal Wellbeing:  Cat II Pain  Control:  Plans unmedicated delivery  Anticipated MOD:  SVD  Sharyon CableRogers, Wilmarie Sparlin C, CNM 07/18/2018, 6:57 PM

## 2018-07-18 NOTE — Anesthesia Preprocedure Evaluation (Signed)
Anesthesia Evaluation  Patient identified by MRN, date of birth, ID band Patient awake    Reviewed: Allergy & Precautions, H&P , NPO status , Patient's Chart, lab work & pertinent test results  Airway Mallampati: II  TM Distance: >3 FB Neck ROM: full    Dental no notable dental hx. (+) Teeth Intact   Pulmonary neg pulmonary ROS,    Pulmonary exam normal breath sounds clear to auscultation       Cardiovascular hypertension, negative cardio ROS Normal cardiovascular exam Rhythm:regular Rate:Normal     Neuro/Psych PSYCHIATRIC DISORDERS Anxiety Depression negative neurological ROS  negative psych ROS   GI/Hepatic negative GI ROS, Neg liver ROS,   Endo/Other  Morbid obesity  Renal/GU negative Renal ROS     Musculoskeletal   Abdominal (+) + obese,   Peds  Hematology negative hematology ROS (+)   Anesthesia Other Findings   Reproductive/Obstetrics (+) Pregnancy                             Anesthesia Physical Anesthesia Plan  ASA: III  Anesthesia Plan: Epidural   Post-op Pain Management:    Induction:   PONV Risk Score and Plan:   Airway Management Planned:   Additional Equipment:   Intra-op Plan:   Post-operative Plan:   Informed Consent: I have reviewed the patients History and Physical, chart, labs and discussed the procedure including the risks, benefits and alternatives for the proposed anesthesia with the patient or authorized representative who has indicated his/her understanding and acceptance.     Plan Discussed with:   Anesthesia Plan Comments:         Anesthesia Quick Evaluation

## 2018-07-18 NOTE — Progress Notes (Signed)
Tara Mcmahon is a 22 y.o. [id number] at 2829w5d admitted for induction of labor now dx'd with GHTN, laboring q 4-5 mins with generous AF, vertex still ballottable..  Subjective: pt using bounce ball .ambulatory, tolerating contractions well. FB came out in 1 hour p placement.   Objective: BP 134/80   Pulse (!) 113   Temp 98.2 F (36.8 C) (Oral)   Resp 18   Ht 4\' 11"  (1.499 m)   Wt 94.3 kg   LMP 10/12/2017 (Approximate)   SpO2 100%   BMI 41.99 kg/m  I/O last 3 completed shifts: In: 2749.4 [P.O.:660; I.V.:2089.4] Out: 2350 [Urine:2350] No intake/output data recorded. Bedside u/s done confirming vertex with no evidence of cord near head or in forewaters. Discussed with pt who agrees to AROM. FHT:  FHR: 145 bpm, variability: moderate,  accelerations:  Present,  decelerations:  Absent UC:   regular, every 4 minutes SVE:   Dilation: 4.5 Effacement (%): 60 Station: Ballotable Exam by:: Narada Uzzle Arom: clear fluid, no decels. Labs: Lab Results  Component Value Date   WBC 14.3 (H) 07/17/2018   HGB 11.6 (L) 07/17/2018   HCT 35.1 (L) 07/17/2018   MCV 84.4 07/17/2018   PLT 289 07/17/2018    Assessment / Plan: Induction of labor due to [facility],  progressing well on pitocin  Labor: AROM done, will continue with EFM and pitocin IOL. Preeclampsia:   Fetal Wellbeing:  Category I Pain Control:  Labor support without medications I/D:  n/a Anticipated MOD:  NSVD  Tara Mcmahon 07/18/2018, 9:02 PM

## 2018-07-18 NOTE — Anesthesia Procedure Notes (Signed)
Epidural Patient location during procedure: OB Start time: 07/18/2018 11:07 PM End time: 07/18/2018 11:11 PM  Staffing Anesthesiologist: Leilani AbleHatchett, Javi Bollman, MD Performed: anesthesiologist   Preanesthetic Checklist Completed: patient identified, site marked, surgical consent, pre-op evaluation, timeout performed, IV checked, risks and benefits discussed and monitors and equipment checked  Epidural Patient position: sitting Prep: site prepped and draped and DuraPrep Patient monitoring: continuous pulse ox and blood pressure Approach: midline Location: L3-L4 Injection technique: LOR air  Needle:  Needle type: Tuohy  Needle gauge: 17 G Needle length: 9 cm and 9 Needle insertion depth: 7 cm Catheter type: closed end flexible Catheter size: 19 Gauge Catheter at skin depth: 12 cm Test dose: negative and Other  Assessment Sensory level: T9 Events: blood not aspirated, injection not painful, no injection resistance, negative IV test and no paresthesia  Additional Notes Reason for block:procedure for pain

## 2018-07-19 ENCOUNTER — Encounter (HOSPITAL_COMMUNITY): Payer: Self-pay | Admitting: *Deleted

## 2018-07-19 ENCOUNTER — Other Ambulatory Visit: Payer: Self-pay

## 2018-07-19 ENCOUNTER — Encounter: Payer: Medicaid Other | Admitting: Obstetrics & Gynecology

## 2018-07-19 DIAGNOSIS — O1414 Severe pre-eclampsia complicating childbirth: Secondary | ICD-10-CM

## 2018-07-19 DIAGNOSIS — Z3A38 38 weeks gestation of pregnancy: Secondary | ICD-10-CM

## 2018-07-19 DIAGNOSIS — O1490 Unspecified pre-eclampsia, unspecified trimester: Secondary | ICD-10-CM

## 2018-07-19 LAB — COMPREHENSIVE METABOLIC PANEL
ALBUMIN: 2.3 g/dL — AB (ref 3.5–5.0)
ALT: 53 U/L — ABNORMAL HIGH (ref 0–44)
AST: 50 U/L — AB (ref 15–41)
Alkaline Phosphatase: 172 U/L — ABNORMAL HIGH (ref 38–126)
Anion gap: 15 (ref 5–15)
BUN: 10 mg/dL (ref 6–20)
CHLORIDE: 100 mmol/L (ref 98–111)
CO2: 15 mmol/L — AB (ref 22–32)
Calcium: 8.6 mg/dL — ABNORMAL LOW (ref 8.9–10.3)
Creatinine, Ser: 1.18 mg/dL — ABNORMAL HIGH (ref 0.44–1.00)
GFR calc Af Amer: >60 mL/min (ref >60)
GLUCOSE: 158 mg/dL — AB (ref 70–99)
POTASSIUM: 3.4 mmol/L — AB (ref 3.5–5.1)
SODIUM: 130 mmol/L — AB (ref 135–145)
Total Bilirubin: 0.7 mg/dL (ref 0.3–1.2)
Total Protein: 5.3 g/dL — ABNORMAL LOW (ref 6.5–8.1)

## 2018-07-19 LAB — CBC
HEMATOCRIT: 30 % — AB (ref 36.0–46.0)
Hemoglobin: 10 g/dL — ABNORMAL LOW (ref 12.0–15.0)
MCH: 28.3 pg (ref 26.0–34.0)
MCHC: 33.3 g/dL (ref 30.0–36.0)
MCV: 85 fL (ref 78.0–100.0)
Platelets: 279 10*3/uL (ref 150–400)
RBC: 3.53 MIL/uL — ABNORMAL LOW (ref 3.87–5.11)
RDW: 13.7 % (ref 11.5–15.5)
WBC: 28.7 10*3/uL — AB (ref 4.0–10.5)

## 2018-07-19 MED ORDER — DIBUCAINE 1 % RE OINT: 1.0000 "application " | TOPICAL_OINTMENT | RECTAL | Status: DC | PRN

## 2018-07-19 MED ORDER — BENZOCAINE-MENTHOL 20-0.5 % EX AERO: 1.0000 "application " | INHALATION_SPRAY | CUTANEOUS | Status: DC | PRN

## 2018-07-19 MED ORDER — LIDOCAINE HCL 1 % IJ SOLN
30.0000 mL | Freq: Once | INTRAMUSCULAR | Status: DC
  Filled 2018-07-19: qty 30

## 2018-07-19 MED ORDER — SERTRALINE HCL 25 MG PO TABS
25.0000 mg | ORAL_TABLET | Freq: Every day | ORAL | Status: DC
  Administered 2018-07-19 – 2018-07-20 (×2): 25 mg via ORAL
  Filled 2018-07-19 (×2): qty 1

## 2018-07-19 MED ORDER — ACETAMINOPHEN 325 MG PO TABS: 650.0000 mg | ORAL_TABLET | ORAL | Status: DC | PRN

## 2018-07-19 MED ORDER — ZOLPIDEM TARTRATE 5 MG PO TABS: 5.0000 mg | ORAL_TABLET | Freq: Every evening | ORAL | Status: DC | PRN

## 2018-07-19 MED ORDER — SIMETHICONE 80 MG PO CHEW: 80.0000 mg | CHEWABLE_TABLET | ORAL | Status: DC | PRN

## 2018-07-19 MED ORDER — SODIUM CHLORIDE 0.9 % IV SOLN
2.0000 g | Freq: Once | INTRAVENOUS | Status: AC
  Administered 2018-07-19: 2 g via INTRAVENOUS
  Filled 2018-07-19: qty 2

## 2018-07-19 MED ORDER — WITCH HAZEL-GLYCERIN EX PADS: 1.0000 "application " | MEDICATED_PAD | CUTANEOUS | Status: DC | PRN

## 2018-07-19 MED ORDER — ONDANSETRON HCL 4 MG/2ML IJ SOLN: 4.0000 mg | INTRAMUSCULAR | Status: DC | PRN

## 2018-07-19 MED ORDER — ONDANSETRON HCL 4 MG PO TABS: 4.0000 mg | ORAL_TABLET | ORAL | Status: DC | PRN

## 2018-07-19 MED ORDER — GENTAMICIN SULFATE 40 MG/ML IJ SOLN
5.0000 mg/kg | Freq: Once | INTRAVENOUS | Status: DC
  Filled 2018-07-19: qty 8

## 2018-07-19 MED ORDER — PRENATAL MULTIVITAMIN CH
1.0000 | ORAL_TABLET | Freq: Every day | ORAL | Status: DC
  Administered 2018-07-20 – 2018-07-21 (×2): 1 via ORAL
  Filled 2018-07-19 (×2): qty 1

## 2018-07-19 MED ORDER — DIPHENHYDRAMINE HCL 25 MG PO CAPS: 25.0000 mg | ORAL_CAPSULE | Freq: Four times a day (QID) | ORAL | Status: DC | PRN

## 2018-07-19 MED ORDER — TETANUS-DIPHTH-ACELL PERTUSSIS 5-2.5-18.5 LF-MCG/0.5 IM SUSP: 0.5000 mL | Freq: Once | INTRAMUSCULAR | Status: DC

## 2018-07-19 MED ORDER — SENNOSIDES-DOCUSATE SODIUM 8.6-50 MG PO TABS
2.0000 | ORAL_TABLET | ORAL | Status: DC
  Administered 2018-07-20 (×2): 2 via ORAL
  Filled 2018-07-19 (×2): qty 2

## 2018-07-19 MED ORDER — IBUPROFEN 600 MG PO TABS
600.0000 mg | ORAL_TABLET | Freq: Four times a day (QID) | ORAL | Status: DC
  Administered 2018-07-19 – 2018-07-21 (×8): 600 mg via ORAL
  Filled 2018-07-19 (×8): qty 1

## 2018-07-19 MED ORDER — LIDOCAINE HCL (PF) 1 % IJ SOLN
INTRAMUSCULAR | Status: AC
  Filled 2018-07-19: qty 30

## 2018-07-19 MED ORDER — GENTAMICIN SULFATE 40 MG/ML IJ SOLN
5.0000 mg/kg | Freq: Once | INTRAVENOUS | Status: DC
  Filled 2018-07-19: qty 11.75

## 2018-07-19 MED ORDER — COCONUT OIL OIL: 1.0000 "application " | TOPICAL_OIL | Status: DC | PRN

## 2018-07-19 NOTE — Progress Notes (Addendum)
Patient ID: Tara Mcmahon, female   DOB: 1996/01/03, 22 y.o.   MRN: [id number]  Having some right-sided discomfort; just used PCA epid; no s/s pre-e  BP 109/62, P 90 FHR 130s, +accels, +SS, early variables Ctx q 2-3 mins w/ Pit @ 2116mu/mi Cx 5/90/vtx -2; ant  ALT 48  IUP@term  gHTN Early active labor  Plan to check cx in 2 hrs or sooner prn Feels PCA epid is working Anticipate SVD Repeat CMET in am  Cam HaiSHAW, Orazio Weller CNM 07/19/2018 12:17 AM

## 2018-07-19 NOTE — Progress Notes (Signed)
delDelivery Note At  a viable and healthy female was delivered via  (Presentation: ;  ).  APGAR: 9,9, ; weight  pending.   Placenta status:intact, but membranes required ring forceps extraction, and appeared complete , .  Cord:  with the following complications: .  Cord pH: not done  Anesthesia:  Epidural,  Episiotomy:   Lacerations:   second degree median Suture Repair: 3.0 monocryl Est. Blood Loss (mL):    Mom to postpartum.  Baby to Couplet care / Skin to Skin. Delivery done with Sharmon LeydenJuliana Stone MS III Tilda BurrowJohn V Bryndan Bilyk 07/19/2018, 11:13 AM  Patient ID: Carmelina PealAshleigh A Richter, female   DOB: May 26, 1996, 22 y.o.   MRN: [id number]

## 2018-07-19 NOTE — Progress Notes (Signed)
Tara Mcmahon is a 22 y.o. [id number] at 4016w6d  admitted for induction of labor due to gest htn..  Subjective: pt exhausted after a long labor., began to push earlier and had decel, so pitocin d/c'd  FHR now Cat I. So will restart pitocin.  Prenatal hx reviewed with pt. [place] 28 [place], no recent growth scans.  Largest vag delivery was 5lb 12, this one is EFW at least 7.5. Risk of dystocia discussed,    Objective: BP 124/72   Pulse (!) 111   Temp 99.1 F (37.3 C) (Oral)   Resp 20   Ht 4\' 11"  (1.499 m)   Wt 94.3 kg   LMP 10/12/2017 (Approximate)   SpO2 99%   BMI 41.99 kg/m  I/O last 3 completed shifts: In: 2749.4 [P.O.:660; I.V.:2089.4] Out: 2950 [Urine:2350; Emesis/NG output:600] No intake/output data recorded.  FHT:  FHR: 140 bpm, variability: moderate,  accelerations:  Present,  decelerations:  Absent UC:   irregular, every 8-10 minutes SVE:   Dilation: 10 Effacement (%): 90 Station: 0 Exam by:: Dr. Emelda FearFerguson No palpable cervix. Lite hematuria. When pt coughs or bears down the vertex moves down. Labs: Lab Results  Component Value Date   WBC 14.3 (H) 07/17/2018   HGB 11.6 (L) 07/17/2018   HCT 35.1 (L) 07/17/2018   MCV 84.4 07/17/2018   PLT 289 07/17/2018    Assessment / Plan: Protracted active phase, currently inadequate labor.  Labor: will restart pitocin, allow to push when contractions adequate. Preeclampsia:   Fetal Wellbeing:  Category I Pain Control:  Epidural I/D:  n/a Anticipated MOD:  NSVD, will monitor for adequate descent with resumed labor  Tilda BurrowJohn V Reford Olliff 07/19/2018, 8:48 AM

## 2018-07-19 NOTE — Progress Notes (Signed)
Tara Mcmahon is a 22 y.o. [id number] at 7624w6d admitted for induction of labor due to gHTN.  Subjective: Feeling calmer after meeting with Dr. Emelda FearFerguson a moment ago. Asking questions related to possible LTCS if no SVD by noon. FOB providing support at bedside. Asking if photographer can be in OR for c section.  Objective: BP 111/60   Pulse (!) 126   Temp 99.1 F (37.3 C) (Oral)   Resp 18   Ht 4\' 11"  (1.499 m)   Wt 94.3 kg   LMP 10/12/2017 (Approximate)   SpO2 98%   BMI 41.99 kg/m  I/O last 3 completed shifts: In: 2749.4 [P.O.:660; I.V.:2089.4] Out: 2950 [Urine:2350; Emesis/NG output:600] No intake/output data recorded.  FHT:  FHR: 140 bpm, variability: moderate,  accelerations:  Present,  decelerations:  Absent UC:   irregular, every 2-4 minutes SVE:   Dilation: 10 Effacement (%): 100 Station: 0 Exam by:: Dr. Emelda FearFerguson  Labs: Lab Results  Component Value Date   WBC 14.3 (H) 07/17/2018   HGB 11.6 (L) 07/17/2018   HCT 35.1 (L) 07/17/2018   MCV 84.4 07/17/2018   PLT 289 07/17/2018    Assessment / Plan: --Pitocin restarted to facilitate fetal descent --Category 1 fetal tracing --Normotensive --Epidural in place --Anticipate SVD  Calvert CantorSamantha C Jolanda Mccann, CNM 07/19/2018, 9:11 AM

## 2018-07-19 NOTE — Anesthesia Postprocedure Evaluation (Signed)
Anesthesia Post Note  Patient: Tara Mcmahon  Procedure(s) Performed: AN AD HOC LABOR EPIDURAL     Patient location during evaluation: Mother Baby Anesthesia Type: Epidural Level of consciousness: awake and alert Pain management: pain level controlled Vital Signs Assessment: post-procedure vital signs reviewed and stable Respiratory status: spontaneous breathing, nonlabored ventilation and respiratory function stable Cardiovascular status: stable Postop Assessment: no headache, no backache and epidural receding Anesthetic complications: no    Last Vitals:  Vitals:   07/19/18 1528 07/19/18 1855  BP: 117/69 125/74  Pulse: (!) 105 (!) 104  Resp: 18   Temp: 36.6 C 36.6 C  SpO2: 99% 98%    Last Pain:  Vitals:   07/19/18 2029  TempSrc:   PainSc: 3    Pain Goal: Patients Stated Pain Goal: 8 (07/19/18 0730)               Marrion CoyMERRITT,Kelli Egolf

## 2018-07-20 ENCOUNTER — Telehealth: Payer: Self-pay | Admitting: *Deleted

## 2018-07-20 ENCOUNTER — Encounter: Payer: Medicaid Other | Admitting: Women's Health

## 2018-07-20 DIAGNOSIS — Z3A38 38 weeks gestation of pregnancy: Secondary | ICD-10-CM

## 2018-07-20 DIAGNOSIS — O1414 Severe pre-eclampsia complicating childbirth: Secondary | ICD-10-CM

## 2018-07-20 LAB — CBC
HEMATOCRIT: 26.7 % — AB (ref 36.0–46.0)
Hemoglobin: 8.9 g/dL — ABNORMAL LOW (ref 12.0–15.0)
MCH: 28.4 pg (ref 26.0–34.0)
MCHC: 33.3 g/dL (ref 30.0–36.0)
MCV: 85.3 fL (ref 78.0–100.0)
Platelets: 280 10*3/uL (ref 150–400)
RBC: 3.13 MIL/uL — ABNORMAL LOW (ref 3.87–5.11)
RDW: 13.9 % (ref 11.5–15.5)
WBC: 25.4 10*3/uL — ABNORMAL HIGH (ref 4.0–10.5)

## 2018-07-20 MED ORDER — POLYETHYLENE GLYCOL 3350 17 G PO PACK
17.0000 g | PACK | Freq: Every day | ORAL | Status: DC
  Administered 2018-07-20: 17 g via ORAL
  Filled 2018-07-20 (×2): qty 1

## 2018-07-20 MED ORDER — RABIES VACCINE, PCEC IM SUSR
1.0000 mL | Freq: Once | INTRAMUSCULAR | Status: DC
  Filled 2018-07-20: qty 1

## 2018-07-20 MED ORDER — MEASLES, MUMPS & RUBELLA VAC ~~LOC~~ INJ
0.5000 mL | INJECTION | Freq: Once | SUBCUTANEOUS | Status: DC
  Filled 2018-07-20: qty 0.5

## 2018-07-20 MED ORDER — MEASLES, MUMPS & RUBELLA VAC ~~LOC~~ INJ
0.5000 mL | INJECTION | Freq: Once | SUBCUTANEOUS | Status: AC
  Administered 2018-07-21: 0.5 mL via SUBCUTANEOUS
  Filled 2018-07-20 (×2): qty 0.5

## 2018-07-20 MED ORDER — FERROUS FUMARATE 324 (106 FE) MG PO TABS
1.0000 | ORAL_TABLET | Freq: Two times a day (BID) | ORAL | Status: DC
  Administered 2018-07-21 (×2): 106 mg via ORAL
  Filled 2018-07-20 (×3): qty 1

## 2018-07-20 MED ORDER — RABIES VACCINE, PCEC IM SUSR
1.0000 mL | Freq: Once | INTRAMUSCULAR | Status: AC
  Administered 2018-07-20: 1 mL via INTRAMUSCULAR
  Filled 2018-07-20: qty 1

## 2018-07-20 NOTE — Plan of Care (Signed)
Patient progressing as expected.

## 2018-07-20 NOTE — Telephone Encounter (Signed)
Lmom for pt to call us back to set up pp appointment.  07-20-18  AS

## 2018-07-20 NOTE — Lactation Note (Signed)
This note was copied from a baby's chart. Lactation Consultation Note  Patient Name: Tara Mcmahon ZOXWR'UToday's Date: 07/20/2018 Reason for consult: Initial assessment;Difficult latch  P3 mother whose infant is now 7023 hours old.  Mother did not breastfeed her first child who was born at 5826 weeks and only breastfed her second child for 2 weeks due to a high palate and difficult latch.  Mother has a strong desire to breastfeed this baby for at least a year.  Mother has been concerned that baby has not latched and fed since delivery.  I offered to assist and she accepted.  Positioned mother appropriately and assessed baby's suck since mother verbalized her second child had a high palate and she could never latch right. Baby started biting on my gloved finger and suck training initiated to help pull tongue down from upper palate.  With cheek support she started sucking more effectively.  Assisted baby to latch in the football hold on the right breast without difficulty.  Reminded mother to obtain a wide mouth for deep latch and to watch baby for flanged lips.  Taught breast compressions during feeding and mother felt no pain with latching.  Showed parents how to stimulate baby to keep her awake at the breast during feeds.    Encouraged feeding 8-12 times/24 hours or sooner if baby shows cues.  Mother will do breast massage and hand expression before and after feeds.  Colostrum container provided for any EBM she obtains.  Finger feeding demonstrated.    Mom made aware of O/P services, breastfeeding support groups, community resources, and our phone # for post-discharge questions. Father present and supportive.  Mother will call for latch assistance as needed throughout the day and night.     Maternal Data Formula Feeding for Exclusion: No Has patient been taught Hand Expression?: Yes Does the patient have breastfeeding experience prior to this delivery?: Yes  Feeding Feeding Type: Breast  Fed Length of feed: 12 min(still feeding when I left the room)  LATCH Score Latch: Grasps breast easily, tongue down, lips flanged, rhythmical sucking.  Audible Swallowing: A few with stimulation  Type of Nipple: Everted at rest and after stimulation  Comfort (Breast/Nipple): Soft / non-tender  Hold (Positioning): Assistance needed to correctly position infant at breast and maintain latch.  LATCH Score: 8  Interventions Interventions: Breast feeding basics reviewed;Assisted with latch;Skin to skin;Breast massage;Hand express;Position options;Support pillows;Adjust position;Breast compression  Lactation Tools Discussed/Used     Consult Status Consult Status: Follow-up Date: 07/21/18 Follow-up type: In-patient    Bailea Beed R Arilla Hice 07/20/2018, 10:21 AM

## 2018-07-20 NOTE — Progress Notes (Signed)
POSTPARTUM PROGRESS NOTE  Post Partum Day 1  Subjective:  Tara Mcmahon is a 22 y.o. [id number] s/p SVD at 2335w6d.  She reports she is doing well. No acute events overnight. She denies any problems with ambulating, voiding or po intake. Denies nausea or vomiting.  Pain is well controlled.  Lochia is appropriate.  Objective: Blood pressure 110/73, pulse 87, temperature 98.6 F (37 C), temperature source Oral, resp. rate 16, height 4\' 11"  (1.499 m), weight 94.3 kg, last menstrual period 10/12/2017, SpO2 98 %, unknown if currently breastfeeding.  Physical Exam:  General: alert, cooperative and no distress Chest: no respiratory distress Heart:regular rate, distal pulses intact Abdomen: soft, nontender,  Uterine Fundus: firm, appropriately tender DVT Evaluation: No calf swelling or tenderness Skin: warm, dry  Recent Labs    07/19/18 1147 07/20/18 0534  HGB 10.0* 8.9*  HCT 30.0* 26.7*    Assessment/Plan: Tara Mcmahon is a 22 y.o. [id number] s/p SVD for IOL for  gHTN at 8135w6d   PPD#1 - Doing well  Routine postpartum care Patient needs post exposure prophylaxis Rabies vaccination (day #14 dose). Have discussed with pharmacy team, who will look into bringing vaccine over from another facility.  GHTN: BPs have been well controlled.  Contraception: vasectomy + POP  Feeding: breast  Dispo: Plan for discharge tomorrow.   LOS: 3 days   Marcy Sirenatherine Wallace, D.O. OB Fellow  07/20/2018, 1:09 PM

## 2018-07-21 ENCOUNTER — Ambulatory Visit: Payer: Self-pay

## 2018-07-21 LAB — COMPREHENSIVE METABOLIC PANEL
ALT: 30 U/L (ref 0–44)
ANION GAP: 10 (ref 5–15)
AST: 20 U/L (ref 15–41)
Albumin: 2.1 g/dL — ABNORMAL LOW (ref 3.5–5.0)
Alkaline Phosphatase: 123 U/L (ref 38–126)
BILIRUBIN TOTAL: 0.3 mg/dL (ref 0.3–1.2)
BUN: 13 mg/dL (ref 6–20)
CO2: 21 mmol/L — ABNORMAL LOW (ref 22–32)
Calcium: 8.2 mg/dL — ABNORMAL LOW (ref 8.9–10.3)
Chloride: 105 mmol/L (ref 98–111)
Creatinine, Ser: 0.73 mg/dL (ref 0.44–1.00)
Glucose, Bld: 83 mg/dL (ref 70–99)
Potassium: 3.7 mmol/L (ref 3.5–5.1)
Sodium: 136 mmol/L (ref 135–145)
TOTAL PROTEIN: 5.3 g/dL — AB (ref 6.5–8.1)

## 2018-07-21 MED ORDER — IBUPROFEN 600 MG PO TABS: 600.0000 mg | ORAL_TABLET | Freq: Four times a day (QID) | ORAL | 0 refills | Status: DC | PRN

## 2018-07-21 MED ORDER — FERROUS FUMARATE 324 (106 FE) MG PO TABS: 1.0000 | ORAL_TABLET | Freq: Two times a day (BID) | ORAL | 3 refills | Status: DC

## 2018-07-21 MED ORDER — POLYETHYLENE GLYCOL 3350 17 G PO PACK
17.0000 g | PACK | ORAL | Status: DC | PRN
  Administered 2018-07-21: 17 g via ORAL
  Filled 2018-07-21 (×2): qty 1

## 2018-07-21 NOTE — Discharge Instructions (Signed)
Hypertension During Pregnancy Hypertension is also called high blood pressure. High blood pressure means that the force of your blood moving in your body is too strong. When you are pregnant, this condition should be watched carefully. It can cause problems for you and your baby. Follow these instructions at home: Eating and drinking  Drink enough fluid to keep your pee (urine) clear or pale yellow.  Eat healthy foods that are low in salt (sodium). ? Do not add salt to your food. ? Check labels on foods and drinks to see much salt is in them. Look on the label where you see "Sodium." Lifestyle  Do not use any products that contain nicotine or tobacco, such as cigarettes and e-cigarettes. If you need help quitting, ask your doctor.  Do not use alcohol.  Avoid caffeine.  Avoid stress. Rest and get plenty of sleep. General instructions  Take over-the-counter and prescription medicines only as told by your doctor.  While lying down, lie on your left side. This keeps pressure off your baby.  While sitting or lying down, raise (elevate) your feet. Try putting some pillows under your lower legs.  Exercise regularly. Ask your doctor what kinds of exercise are best for you.  Keep all prenatal and follow-up visits as told by your doctor. This is important. Contact a doctor if:  You have symptoms that your doctor told you to watch for, such as: ? Fever. ? Throwing up (vomiting). ? Headache. Get help right away if:  You have very bad pain in your belly (abdomen).  You are throwing up, and this does not get better with treatment.  You suddenly get swelling in your hands, ankles, or face.  You gain 4 lb (1.8 kg) or more in 1 week.  You get bleeding from your vagina.  You have blood in your pee.  You do not feel your baby moving as much as normal.  You have a change in vision.  You have muscle twitching or sudden tightening (spasms).  You have trouble breathing.  Your  lips or fingernails turn blue. This information is not intended to replace advice given to you by your health care provider. Make sure you discuss any questions you have with your health care provider. Document Released: 12/17/2010 Document Revised: 07/26/2016 Document Reviewed: 07/26/2016 Elsevier Interactive Patient Education  2018 [employer] Vaginal Delivery, Care After Refer to this sheet in the next few weeks. These instructions provide you with information about caring for yourself after vaginal delivery. Your health care provider may also give you more specific instructions. Your treatment has been planned according to current medical practices, but problems sometimes occur. Call your health care provider if you have any problems or questions. What can I expect after the procedure? After vaginal delivery, it is common to have:  Some bleeding from your vagina.  Soreness in your abdomen, your vagina, and the area of skin between your vaginal opening and your anus (perineum).  Pelvic cramps.  Fatigue.  Follow these instructions at home: Medicines  Take over-the-counter and prescription medicines only as told by your health care provider.  If you were prescribed an antibiotic medicine, take it as told by your health care provider. Do not stop taking the antibiotic until it is finished. Driving   Do not drive or operate heavy machinery while taking prescription pain medicine.  Do not drive for 24 hours if you received a sedative. Lifestyle  Do not drink alcohol. This is especially important if you  are breastfeeding or taking medicine to relieve pain.  Do not use tobacco products, including cigarettes, chewing tobacco, or e-cigarettes. If you need help quitting, ask your health care provider. Eating and drinking  Drink at least 8 eight-ounce glasses of water every day unless you are told not to by your health care provider. If you choose to breastfeed your baby, you may need  to drink more water than this.  Eat high-fiber foods every day. These foods may help prevent or relieve constipation. High-fiber foods include: ? Whole grain cereals and breads. ? Brown rice. ? Beans. ? Fresh fruits and vegetables. Activity  Return to your normal activities as told by your health care provider. Ask your health care provider what activities are safe for you.  Rest as much as possible. Try to rest or take a nap when your baby is sleeping.  Do not lift anything that is heavier than your baby or 10 lb (4.5 kg) until your health care provider says that it is safe.  Talk with your health care provider about when you can engage in sexual activity. This may depend on your: ? Risk of infection. ? Rate of healing. ? Comfort and desire to engage in sexual activity. Vaginal Care  If you have an episiotomy or a vaginal tear, check the area every day for signs of infection. Check for: ? More redness, swelling, or pain. ? More fluid or blood. ? Warmth. ? Pus or a bad smell.  Do not use tampons or douches until your health care provider says this is safe.  Watch for any blood clots that may pass from your vagina. These may look like clumps of dark red, brown, or black discharge. General instructions  Keep your perineum clean and dry as told by your health care provider.  Wear loose, comfortable clothing.  Wipe from front to back when you use the toilet.  Ask your health care provider if you can shower or take a bath. If you had an episiotomy or a perineal tear during labor and delivery, your health care provider may tell you not to take baths for a certain length of time.  Wear a bra that supports your breasts and fits you well.  If possible, have someone help you with household activities and help care for your baby for at least a few days after you leave the hospital.  Keep all follow-up visits for you and your baby as told by your health care provider. This is  important. Contact a health care provider if:  You have: ? Vaginal discharge that has a bad smell. ? Difficulty urinating. ? Pain when urinating. ? A sudden increase or decrease in the frequency of your bowel movements. ? More redness, swelling, or pain around your episiotomy or vaginal tear. ? More fluid or blood coming from your episiotomy or vaginal tear. ? Pus or a bad smell coming from your episiotomy or vaginal tear. ? A fever. ? A rash. ? Little or no interest in activities you used to enjoy. ? Questions about caring for yourself or your baby.  Your episiotomy or vaginal tear feels warm to the touch.  Your episiotomy or vaginal tear is separating or does not appear to be healing.  Your breasts are painful, hard, or turn red.  You feel unusually sad or worried.  You feel nauseous or you vomit.  You pass large blood clots from your vagina. If you pass a blood clot from your vagina, save it to show  to your health care provider. Do not flush blood clots down the toilet without having your health care provider look at them.  You urinate more than usual.  You are dizzy or light-headed.  You have not breastfed at all and you have not had a menstrual period for 12 weeks after delivery.  You have stopped breastfeeding and you have not had a menstrual period for 12 weeks after you stopped breastfeeding. Get help right away if:  You have: ? Pain that does not go away or does not get better with medicine. ? Chest pain. ? Difficulty breathing. ? Blurred vision or spots in your vision. ? Thoughts about hurting yourself or your baby.  You develop pain in your abdomen or in one of your legs.  You develop a severe headache.  You faint.  You bleed from your vagina so much that you fill two sanitary pads in one hour. This information is not intended to replace advice given to you by your health care provider. Make sure you discuss any questions you have with your health care  provider. Document Released: 11/11/2000 Document Revised: 04/27/2016 Document Reviewed: 11/29/2015 Elsevier Interactive Patient Education  2018 [employer].

## 2018-07-21 NOTE — Discharge Summary (Signed)
OB Discharge Summary     Patient Name: Tara Mcmahon DOB: [birth year] MRN: [id number]  Date of admission: 07/17/2018 Delivering MD: Jonnie Kind   Date of discharge: 07/21/2018  Admitting diagnosis: 38 WKS, HEADACHES, BLURRED VISION Intrauterine pregnancy: [redacted]w[redacted]d    Secondary diagnosis:  Active Problems:   Depression with anxiety   Gestational hypertension   History of cervical incompetence in pregnancy, currently pregnant   History of preterm delivery, currently pregnant   Rubella non-immune status, antepartum  Additional problems: none     Discharge diagnosis: Term Pregnancy Delivered, Gestational Hypertension and Anemia                                                                                                Post partum procedures:offered MMR prior to d/c  Augmentation: Pitocin  Complications: Intrauterine Inflammation or infection (Chorioamniotis)  Hospital course:  Induction of Labor With Vaginal Delivery   22y.o. yo [id number] 320w6das admitted to the hospital 07/17/2018 for induction of labor.  Indication for induction: Gestational hypertension. She was initially noted to have a H/A and ALT of 48 and so was started on mag sulfate therapy for pre-e with severe features. BPs were 120-130/70-80s- none in severe range during her entire stay. On 07/18/18 she was given a H/A cocktail with relief of her symptoms, and that combined with her being normotensive led to stopping the mag sulfate and keeping her diagnosis gHTN. She got into labor with the Pit and became complete by the early morning on 07/19/18. She was noted to have a T of 101; initially abx were not given due to planning for imminent delivery, but she required time to labor down and so was given a dose of Amp but delivered prior to GePratteing given. Membrane Rupture Time/Date: 7:30 AM ,07/19/2018   Intrapartum Procedures: Episiotomy: None [1]                                         Lacerations:  2nd degree [3]   Patient had delivery of a Viable infant.  Information for the patient's newborn:  MuJasime, Westergren0[[id number]]Delivery Method: Vaginal, Spontaneous(Filed from Delivery Summary)   07/19/2018  Details of delivery can be found in separate delivery note.  Patient had a routine postpartum course. She remained afebrile and essentially normotensive other than one BP of 130/91. Patient is discharged home 07/21/18.  Physical exam  Vitals:   07/20/18 0500 07/20/18 1401 07/20/18 2258 07/21/18 0528  BP: 110/73 (!) 130/91 131/87 118/82  Pulse: 87 92 73 85  Resp: '16 18 16 18  ' Temp: 98.6 F (37 C) 97.8 F (36.6 C) 98.1 F (36.7 C) 98 F (36.7 C)  TempSrc: Oral Oral Oral Oral  SpO2:  98% 100%   Weight:      Height:       General: alert and cooperative Lochia: appropriate Uterine Fundus: firm Incision: N/A DVT Evaluation: No evidence of DVT seen on physical exam. Labs: Lab  Results  Component Value Date   WBC 25.4 (H) 07/20/2018   HGB 8.9 (L) 07/20/2018   HCT 26.7 (L) 07/20/2018   MCV 85.3 07/20/2018   PLT 280 07/20/2018   CMP Latest Ref Rng & Units 07/21/2018  Glucose 70 - 99 mg/dL 83  BUN 6 - 20 mg/dL 13  Creatinine 0.44 - 1.00 mg/dL 0.73  Sodium 135 - 145 mmol/L 136  Potassium 3.5 - 5.1 mmol/L 3.7  Chloride 98 - 111 mmol/L 105  CO2 22 - 32 mmol/L 21(L)  Calcium 8.9 - 10.3 mg/dL 8.2(L)  Total Protein 6.5 - 8.1 g/dL 5.3(L)  Total Bilirubin 0.3 - 1.2 mg/dL 0.3  Alkaline Phos 38 - 126 U/L 123  AST 15 - 41 U/L 20  ALT 0 - 44 U/L 30    Discharge instruction: per After Visit Summary and "Baby and Me Booklet".  After visit meds:  Allergies as of 07/21/2018      Reactions   Shellfish Allergy Anaphylaxis, Swelling   Only Crawfish not allergic to any other shellfish       Medication List    STOP taking these medications   calcium carbonate 500 MG chewable tablet Commonly known as:  TUMS - dosed in mg elemental calcium   diphenhydrAMINE 25 MG tablet Commonly known as:   BENADRYL     TAKE these medications   Ferrous Fumarate 324 (106 Fe) MG Tabs tablet Commonly known as:  HEMOCYTE - 106 mg FE Take 1 tablet (106 mg of iron total) by mouth 2 (two) times daily.   ibuprofen 600 MG tablet Commonly known as:  ADVIL,MOTRIN Take 1 tablet (600 mg total) by mouth every 6 (six) hours as needed.   PRENATAL ADULT GUMMY/DHA/FA PO Take 1 each by mouth daily.   sertraline 25 MG tablet Commonly known as:  ZOLOFT Take 1 tablet (25 mg total) by mouth daily. What changed:  when to take this       Diet: routine diet  Activity: Advance as tolerated. Pelvic rest for 6 weeks.   Outpatient follow up:BP check in 1 wk; PP visit in 4 weeks Follow up Appt: Future Appointments  Date Time Provider [place]  08/24/2018 12:30 PM Roma Schanz, CNM [facility]   Follow up Visit:No follow-ups on file.  Postpartum contraception: Vasectomy  Newborn Data: Live born female  Birth Weight: 8 lb (3630 g) APGAR: 70, 39  Newborn Delivery   Birth date/time:  07/19/2018 10:36:00 Delivery type:  Vaginal, Spontaneous     Baby Feeding: Breast Disposition:home with mother   07/21/2018 Serita Grammes, CNM  7:35 AM

## 2018-07-21 NOTE — Lactation Note (Signed)
This note was copied from a baby's chart. Lactation Consultation Note:  LC arrived in the room and mother had independently latched infant in football hold. Observed infant with wide open gape. Mother encouraged breast compression. Rhythmic suckling and swallows were observed.  Infant sustained latch for 25 mins.   Assist mother with placing infant in cross cradle hold on alternate breast.  Assist with proper positioning infant with good pillow support.  Mother reports that nipples are sore. Mother was given comfort gels.   Mother taught to latch infant with nipple to nose technique.  Discussed cluster feeding , cue base feeding and feeding infant 8-12 times in 24 hours.  Reviewed treatment and prevention of engoragement.  [facility], encouraged to follow up to [facility]  Mother receptive to all teaching.   Patient Name: Tara Mcmahon ZOXWR'UToday's Date: 07/21/2018 Reason for consult: Follow-up assessment   Maternal Data    Feeding Feeding Type: Breast Fed Length of feed: 15 min  LATCH Score Latch: Grasps breast easily, tongue down, lips flanged, rhythmical sucking.  Audible Swallowing: Spontaneous and intermittent  Type of Nipple: Everted at rest and after stimulation  Comfort (Breast/Nipple): Soft / non-tender  Hold (Positioning): Assistance needed to correctly position infant at breast and maintain latch.  LATCH Score: 9  Interventions Interventions: Skin to skin;Hand express;Breast compression;Adjust position;Support pillows;Position options;Expressed milk;Comfort gels;Hand pump  Lactation Tools Discussed/Used     Consult Status Consult Status: Complete    Michel BickersKendrick, [name] 07/21/2018, 12:25 PM

## 2018-07-28 ENCOUNTER — Encounter (HOSPITAL_COMMUNITY): Payer: Self-pay | Admitting: *Deleted

## 2018-07-28 ENCOUNTER — Inpatient Hospital Stay (HOSPITAL_COMMUNITY)
Admission: AD | Admit: 2018-07-28 | Discharge: 2018-07-28 | Disposition: A | Discharge status: Home / Self Care | Payer: Medicaid Other | Attending: Obstetrics and Gynecology | Admitting: Obstetrics and Gynecology

## 2018-07-28 ENCOUNTER — Inpatient Hospital Stay (HOSPITAL_COMMUNITY): Payer: Medicaid Other

## 2018-07-28 DIAGNOSIS — F419 Anxiety disorder, unspecified: Secondary | ICD-10-CM | POA: Diagnosis not present

## 2018-07-28 DIAGNOSIS — R339 Retention of urine, unspecified: Secondary | ICD-10-CM | POA: Diagnosis not present

## 2018-07-28 DIAGNOSIS — Z79899 Other long term (current) drug therapy: Secondary | ICD-10-CM | POA: Insufficient documentation

## 2018-07-28 DIAGNOSIS — O8611 Cervicitis following delivery: Secondary | ICD-10-CM | POA: Diagnosis not present

## 2018-07-28 DIAGNOSIS — N939 Abnormal uterine and vaginal bleeding, unspecified: Secondary | ICD-10-CM | POA: Diagnosis not present

## 2018-07-28 DIAGNOSIS — F329 Major depressive disorder, single episode, unspecified: Secondary | ICD-10-CM | POA: Diagnosis not present

## 2018-07-28 DIAGNOSIS — Z8379 Family history of other diseases of the digestive system: Secondary | ICD-10-CM | POA: Diagnosis not present

## 2018-07-28 DIAGNOSIS — Z91013 Allergy to seafood: Secondary | ICD-10-CM | POA: Insufficient documentation

## 2018-07-28 DIAGNOSIS — N898 Other specified noninflammatory disorders of vagina: Secondary | ICD-10-CM | POA: Diagnosis present

## 2018-07-28 DIAGNOSIS — O9089 Other complications of the puerperium, not elsewhere classified: Secondary | ICD-10-CM | POA: Diagnosis not present

## 2018-07-28 DIAGNOSIS — Z8261 Family history of arthritis: Secondary | ICD-10-CM | POA: Insufficient documentation

## 2018-07-28 LAB — URINALYSIS, ROUTINE W REFLEX MICROSCOPIC
BILIRUBIN URINE: NEGATIVE
Glucose, UA: NEGATIVE mg/dL
KETONES UR: NEGATIVE mg/dL
NITRITE: NEGATIVE
PROTEIN: NEGATIVE mg/dL
SPECIFIC GRAVITY, URINE: 1.017 (ref 1.005–1.030)
pH: 6 (ref 5.0–8.0)

## 2018-07-28 MED ORDER — METRONIDAZOLE 500 MG PO TABS: 500.0000 mg | ORAL_TABLET | Freq: Two times a day (BID) | ORAL | 1 refills | Status: DC

## 2018-07-28 NOTE — MAU Provider Note (Signed)
Chief Complaint: Vaginal Discharge   First Provider Initiated Contact with Patient 07/28/18 1530     SUBJECTIVE HPI: Tara Mcmahon is a 22 y.o. [id number] at Unknown who presents to Maternity Admissions reporting passage of a clump of her Morphis tissue at home with some associated bleeding.  She presents here to see if there is any membrane remnants or other complications of delivery.  She is approximately 10 days status post vaginal delivery that was notable according to my delivery note that the membranes did not appear quite is voluminous as anticipated.  The placenta was specifically noted to be intact and membranes had to be teased out Since being here we have had an ultrasound which shows a normal uterus without evidence of residual tissue   Additionally the patient complains of inability to void difficulty getting her urine just flow to start.  She was not able to empty her bladder completely before the ultrasound location: Uterus Quality: Mild Severity: Mild Duration: With passage of tissue Timing: None now Modifying factors:  Associated signs and symptoms: History of vaginal delivery with possible tiny membrane remnants, placenta suspected to be intact and inspected at delivery  Past Medical History:  Diagnosis Date  . Anxiety   . Cervical incompetence during pregnancy   . Depression   . Hypothyroidism   . PCOS (polycystic ovarian syndrome)   . Pregnancy induced hypertension    With second pregnancy   OB History  Gravida Para Term Preterm AB Living  4 3 2 1 1 3   SAB TAB Ectopic Multiple Live Births  1     0 3    # Outcome Date GA Lbr Len/2nd Weight Sex Delivery Anes PTL Lv  4 Term 07/19/18 [redacted]w[redacted]d 16:23 / 04:43 3630 g F Vag-Spont EPI  LIV     Birth Comments: WNL  3 Term 08/30/16 [redacted]w[redacted]d 07:05 / 03:13 2695 g F Vag-Spont EPI Y LIV     Complications: History of cervical cerclage  2 Preterm 09/19/12 [redacted]w[redacted]d  822 g F Vag-Spont None Y LIV     Complications: Cervical  incompetence, History of preterm premature rupture of membranes (PPROM)  1 SAB 2013           Past Surgical History:  Procedure Laterality Date  . CERVICAL CERCLAGE    . CERVICAL CERCLAGE N/A 03/09/2016   Procedure: [facility] CERCLAGE PLACEMENT;  Surgeon: Lazaro Arms, MD;  Location: [facility];  Service: Gynecology;  Laterality: N/A;  . CERVICAL CERCLAGE N/A 01/31/2018   Procedure: CERCLAGE CERVICAL;  Surgeon: Lazaro Arms, MD;  Location: [facility];  Service: Gynecology;  Laterality: N/A;  . CERVICAL CERCLAGE     Placed in 2013, 2017, and 2019  . labial reduction     Social History   Socioeconomic History  . Marital status: Married    Spouse name: Not on file  . Number of children: Not on file  . Years of education: Not on file  . Highest education level: Not on file  Occupational History  . Not on file  Social Needs  . Financial resource strain: Not on file  . Food insecurity:    Worry: Not on file    Inability: Not on file  . Transportation needs:    Medical: Not on file    Non-medical: Not on file  Tobacco Use  . Smoking status: Never Smoker  . Smokeless tobacco: Never Used  Substance and Sexual Activity  . Alcohol use: No    Frequency: Never  Comment: occ; not now  . Drug use: No  . Sexual activity: Yes    Birth control/protection: None  Lifestyle  . Physical activity:    Days per week: Not on file    Minutes per session: Not on file  . Stress: Not on file  Relationships  . Social connections:    Talks on phone: Not on file    Gets together: Not on file    Attends religious service: Not on file    Active member of club or organization: Not on file    Attends meetings of clubs or organizations: Not on file    Relationship status: Not on file  . Intimate partner violence:    Fear of current or ex partner: Not on file    Emotionally abused: Not on file    Physically abused: Not on file    Forced sexual activity: Not on file  Other Topics Concern  . Not on file   Social History Narrative  . Not on file   Family History  Problem Relation Age of Onset  . Arthritis Mother   . Lupus Mother   . Autoimmune disease Mother   . Crohn's disease Mother   . Cancer Maternal Grandmother        breast  . Heart disease Maternal Grandfather   . Heart disease Paternal Grandfather   . Hypertension Paternal Grandfather   . Lupus Maternal Aunt    No current facility-administered medications on file prior to encounter.    Current Outpatient Medications on File Prior to Encounter  Medication Sig Dispense Refill  . Ferrous Fumarate (HEMOCYTE - 106 MG FE) 324 (106 Fe) MG TABS tablet Take 1 tablet (106 mg of iron total) by mouth 2 (two) times daily. 60 tablet 3  . ibuprofen (ADVIL,MOTRIN) 600 MG tablet Take 1 tablet (600 mg total) by mouth every 6 (six) hours as needed. 30 tablet 0  . Prenatal MV & Min w/FA-DHA (PRENATAL ADULT GUMMY/DHA/FA PO) Take 1 each by mouth daily.     . sertraline (ZOLOFT) 25 MG tablet Take 1 tablet (25 mg total) by mouth daily. (Patient taking differently: Take 25 mg by mouth at bedtime. ) 30 tablet 11   Allergies  Allergen Reactions  . Shellfish Allergy Anaphylaxis and Swelling    Only Crawfish not allergic to any other shellfish     I have reviewed patient's Past Medical Hx, Surgical Hx, Family Hx, Social Hx, medications and allergies.   Review of Systems  OBJECTIVE Patient Vitals for the past 24 hrs:  BP Temp Pulse Resp Height Weight  07/28/18 1509 114/67 98.1 F (36.7 C) 84 18 4\' 11"  (1.499 m) 89.4 kg   Constitutional: Well-developed, well-nourished female in no acute distress.  Cardiovascular: normal rate & rhythm, no murmur Respiratory: normal rate and effort. Lung sounds clear throughout GI: Abd soft, non-tender, Pos BS x 4. No guarding or rebound tenderness MS: Extremities nontender, no edema, normal ROM Neurologic: Alert and oriented x 4.  GU:     SPECULUM EXAM: NEFG, physiologic discharge with some milky white mucus  discharge not purulent, more consistent with pigmentations of a small bit of membrane remnants, no blood noted, cervix clean  BIMANUAL: No CMT. cervix somewhat everted with endocervical glands swollen particularly on the patient's left side of the cervix; uterus normal size for postpartum state, no adnexal tenderness or masses.   Ring forceps were used to probe the lower uterine segment and some mucousy discharge was obtained.  The  lower uterine segment is closed and I am unable to pass the ring forceps into the uterine cavity itself.  I suspect the cervix is somewhat irregular due to the history of the cerclage LAB RESULTS Results for orders placed or performed during the hospital encounter of 07/28/18 (from the past 24 hour(s))  Urinalysis, Routine w reflex microscopic     Status: Abnormal   Collection Time: 07/28/18  3:42 PM  Result Value Ref Range   Color, Urine YELLOW YELLOW   APPearance HAZY (A) CLEAR   Specific Gravity, Urine 1.017 1.005 - 1.030   pH 6.0 5.0 - 8.0   Glucose, UA NEGATIVE NEGATIVE mg/dL   Hgb urine dipstick LARGE (A) NEGATIVE   Bilirubin Urine NEGATIVE NEGATIVE   Ketones, ur NEGATIVE NEGATIVE mg/dL   Protein, ur NEGATIVE NEGATIVE mg/dL   Nitrite NEGATIVE NEGATIVE   Leukocytes, UA LARGE (A) NEGATIVE   RBC / HPF 21-50 0 - 5 RBC/hpf   WBC, UA >50 (H) 0 - 5 WBC/hpf   Bacteria, UA RARE (A) NONE SEEN   Squamous Epithelial / LPF 0-5 0 - 5   Mucus PRESENT    GC and Chlamydia cultures were negative at 36 weeks IMAGING [place] Pelvis (transabdominal Only)  Result Date: 07/28/2018 CLINICAL DATA:  22 year old female presents with postpartum bleeding and passage of tissue. History of cervical cerclage. Nine days status post vaginal delivery. EXAM: TRANSABDOMINAL ULTRASOUND OF PELVIS TECHNIQUE: Transabdominal ultrasound examination of the pelvis was performed including evaluation of the uterus, ovaries, adnexal regions, and pelvic cul-de-sac. COMPARISON:  None. FINDINGS: Uterus  Measurements: 17.3 x 6.7 x 10.0 cm. Normal size and configuration of the anteverted postpartum uterus. No uterine fibroids or other myometrial abnormalities. Endometrium Thickness: 8 mm. No focal endometrial mass, endometrial cavity fluid or endometrial vascularity on Doppler. Right ovary Measurements: 4.6 x 2.5 x 3.4 cm. Normal appearance/no adnexal mass. Left ovary Measurements: 3.3 x 1.4 x 2.6 cm. Normal appearance/no adnexal mass. Other findings:  No abnormal free fluid. IMPRESSION: 1. No sonographic evidence of retained products of conception. Normal postpartum uterus. 2. No ovarian or adnexal abnormality. Electronically Signed   By: Delbert PhenixJason A Poff M.D.   On: 07/28/2018 17:14    MAU COURSE Orders Placed This Encounter  Procedures  . US PELVIS (TRANSABDOMINAL ONLY)  . Urinalysis, Routine w reflex microscopic  Ultrasound shows no tissue in the uterus No orders of the defined types were placed in this encounter.   MDM  ASSESSMENT increased discharge due to small amount retained membrane remnants, now resolved Cervical irritation status post cerclage, status post membrane remnants we will treat with metronidazole for cervicitis Postpartum urinary retention, suspect gradual recovery 1. Retained amniotic membrane without hemorrhage   2. Postpartum bleeding   3. Cervicitis in postpartum period    Postpartum cervicitis PLAN 1. Metronidazole 500 bid x 7 d  Discharge home in stable condition. Bleeding precautions [facility]   Tilda BurrowFerguson, Kathey Simer V, MD 07/28/2018  6:07 PM   Tilda BurrowJohn V Arrington Yohe, MD

## 2018-07-28 NOTE — MAU Note (Signed)
Pt is 9 days post partum vag delivery. Reports she has been having "gushes of blood come out for a few days and pasted a moderat sized tissue like substance out this morning. Still having abd cramping and pain rates 3-4 but not any worse than it has been since delivery. Not needding any ibuprofin for it most days.

## 2018-07-29 ENCOUNTER — Inpatient Hospital Stay (HOSPITAL_COMMUNITY)
Admission: AD | Admit: 2018-07-29 | Discharge: 2018-07-30 | Disposition: A | Discharge status: Home / Self Care | Payer: Medicaid Other | Attending: Obstetrics & Gynecology | Admitting: Obstetrics & Gynecology

## 2018-07-29 ENCOUNTER — Encounter (HOSPITAL_COMMUNITY): Payer: Self-pay

## 2018-07-29 DIAGNOSIS — Z79899 Other long term (current) drug therapy: Secondary | ICD-10-CM | POA: Insufficient documentation

## 2018-07-29 DIAGNOSIS — O8620 Urinary tract infection following delivery, unspecified: Secondary | ICD-10-CM

## 2018-07-29 DIAGNOSIS — Z791 Long term (current) use of non-steroidal anti-inflammatories (NSAID): Secondary | ICD-10-CM | POA: Diagnosis not present

## 2018-07-29 DIAGNOSIS — F419 Anxiety disorder, unspecified: Secondary | ICD-10-CM | POA: Diagnosis not present

## 2018-07-29 DIAGNOSIS — E039 Hypothyroidism, unspecified: Secondary | ICD-10-CM | POA: Insufficient documentation

## 2018-07-29 DIAGNOSIS — R338 Other retention of urine: Secondary | ICD-10-CM

## 2018-07-29 DIAGNOSIS — R339 Retention of urine, unspecified: Secondary | ICD-10-CM | POA: Diagnosis present

## 2018-07-29 DIAGNOSIS — B9689 Other specified bacterial agents as the cause of diseases classified elsewhere: Secondary | ICD-10-CM | POA: Diagnosis not present

## 2018-07-29 DIAGNOSIS — N390 Urinary tract infection, site not specified: Secondary | ICD-10-CM | POA: Insufficient documentation

## 2018-07-29 DIAGNOSIS — F329 Major depressive disorder, single episode, unspecified: Secondary | ICD-10-CM | POA: Insufficient documentation

## 2018-07-29 DIAGNOSIS — E282 Polycystic ovarian syndrome: Secondary | ICD-10-CM | POA: Diagnosis not present

## 2018-07-29 NOTE — MAU Note (Signed)
Pt states she had SVD 10 days ago. States since her delivery, she has had a hard time starting urination but has been able to go to the bathroom. States today she is only able to pee small trickles of urine. States it is now hurting with urination.

## 2018-07-30 DIAGNOSIS — R338 Other retention of urine: Secondary | ICD-10-CM

## 2018-07-30 DIAGNOSIS — O8620 Urinary tract infection following delivery, unspecified: Secondary | ICD-10-CM | POA: Diagnosis not present

## 2018-07-30 LAB — URINALYSIS, ROUTINE W REFLEX MICROSCOPIC
Bilirubin Urine: NEGATIVE
Glucose, UA: NEGATIVE mg/dL
Ketones, ur: NEGATIVE mg/dL
Nitrite: NEGATIVE
Protein, ur: NEGATIVE mg/dL
Specific Gravity, Urine: 1.018 (ref 1.005–1.030)
WBC, UA: >50 WBC/hpf — ABNORMAL HIGH (ref 0–5)
pH: 5 (ref 5.0–8.0)

## 2018-07-30 MED ORDER — CEPHALEXIN 500 MG PO CAPS: 500.0000 mg | ORAL_CAPSULE | Freq: Four times a day (QID) | ORAL | 0 refills | Status: DC

## 2018-07-30 NOTE — MAU Provider Note (Signed)
History     CSN: [id number]  Arrival date and time: 07/29/18 2158   First Provider Initiated Contact with Patient 07/29/18 2344      Chief Complaint  Patient presents with  . Urinary Retention    Tara Mcmahon is a 22 y.o. G4P3 who is 10 days PP from SVD on 8/22 who presents to MAU with complaints of urinary retention. She reports she has been having difficulty urinating since delivery, reports having a epidural with catherization for over 12 hours, pink tinge bleeding in catheter bag and a long labor but no other complications during delivery. She reports small trickles of urination when she goes to the bathroom. She reports pressure and dysuria with urination-rates pain 3/10. Was seen in MAU yesterday for cervicitis.   OB History    Gravida  4   Para  3   Term  2   Preterm  1   AB  1   Living  3     SAB  1   TAB      Ectopic      Multiple  0   Live Births  3           Past Medical History:  Diagnosis Date  . Anxiety   . Cervical incompetence during pregnancy   . Depression   . Hypothyroidism   . PCOS (polycystic ovarian syndrome)   . Pregnancy induced hypertension    With second pregnancy    Past Surgical History:  Procedure Laterality Date  . CERVICAL CERCLAGE    . CERVICAL CERCLAGE N/A 03/09/2016   Procedure: [facility] CERCLAGE PLACEMENT;  Surgeon: Lazaro Arms, MD;  Location: [facility];  Service: Gynecology;  Laterality: N/A;  . CERVICAL CERCLAGE N/A 01/31/2018   Procedure: CERCLAGE CERVICAL;  Surgeon: Lazaro Arms, MD;  Location: [facility];  Service: Gynecology;  Laterality: N/A;  . CERVICAL CERCLAGE     Placed in 2013, 2017, and 2019  . labial reduction      Family History  Problem Relation Age of Onset  . Arthritis Mother   . Lupus Mother   . Autoimmune disease Mother   . Crohn's disease Mother   . Cancer Maternal Grandmother        breast  . Heart disease Maternal Grandfather   . Heart disease Paternal Grandfather   . Hypertension  Paternal Grandfather   . Lupus Maternal Aunt     Social History   Tobacco Use  . Smoking status: Never Smoker  . Smokeless tobacco: Never Used  Substance Use Topics  . Alcohol use: No    Frequency: Never    Comment: occ; not now  . Drug use: No    Allergies:  Allergies  Allergen Reactions  . Shellfish Allergy Anaphylaxis and Swelling    Only Crawfish not allergic to any other shellfish     Medications Prior to Admission  Medication Sig Dispense Refill Last Dose  . Ferrous Fumarate (HEMOCYTE - 106 MG FE) 324 (106 Fe) MG TABS tablet Take 1 tablet (106 mg of iron total) by mouth 2 (two) times daily. 60 tablet 3 07/29/2018 at Unknown time  . ibuprofen (ADVIL,MOTRIN) 600 MG tablet Take 1 tablet (600 mg total) by mouth every 6 (six) hours as needed. 30 tablet 0 07/29/2018 at Unknown time  . Prenatal MV & Min w/FA-DHA (PRENATAL ADULT GUMMY/DHA/FA PO) Take 1 each by mouth daily.    Past Week at Unknown time  . sertraline (ZOLOFT) 25 MG tablet Take  1 tablet (25 mg total) by mouth daily. (Patient taking differently: Take 25 mg by mouth at bedtime. ) 30 tablet 11 07/28/2018 at Unknown time  . metroNIDAZOLE (FLAGYL) 500 MG tablet Take 1 tablet (500 mg total) by mouth 2 (two) times daily. 14 tablet 1     Review of Systems  Constitutional: Negative.   Respiratory: Negative.   Cardiovascular: Negative.   Gastrointestinal: Negative.   Genitourinary: Positive for decreased urine volume, difficulty urinating and dysuria. Negative for frequency, hematuria and urgency.  Neurological: Negative.    Physical Exam   Blood pressure 135/77, pulse 98, temperature 98.3 F (36.8 C), temperature source Oral, resp. rate 16, height 4\' 11"  (1.499 m), weight 87.1 kg, SpO2 100 %, unknown if currently breastfeeding.  Physical Exam  Nursing note and vitals reviewed. Constitutional: She is oriented to person, place, and time. She appears well-developed and well-nourished. No distress.  Cardiovascular: Normal  rate, regular rhythm and normal heart sounds.  Respiratory: Effort normal and breath sounds normal. No respiratory distress. She has no wheezes.  GI: Soft. Bowel sounds are normal. She exhibits no distension. There is no tenderness.  Genitourinary: No bleeding in the vagina. Vaginal discharge found.  Genitourinary Comments: White thin vaginal discharge without odor otherwise pelvic examination unremarkable  Neurological: She is alert and oriented to person, place, and time.  Skin: Skin is warm and dry.  Psychiatric: She has a normal mood and affect. Her behavior is normal. Thought content normal.     MAU Course  Procedures  MDM Urinalysis  Urine culture pending  Bladder scan   Bladder scan prior to urination 150 and after urination in MAU 35. UA- showed increased Leukocytes and elevated WBC with rare bacteria, will treat for UTI based on symptoms and urinary retention. Urine culture pending.  Educated and discussed bladder training with patient. Educated on going to restroom every 2-3 hours prior to breastfeeding and continue until bladder function is recovered. Patient verbalizes understanding.   Assessment and Plan   1. Acute urinary retention   2. Postpartum urinary tract infection   3.      Postpartum urinary retention, suspect gradual recovery   Discharge home  Follow up as scheduled for postpartum appointment on Friday  Discussed reasons to return to MAU  Rx for Keflex for treatment of suspected UTI  Urine culture pending, will call with positive results and manage accordingly   Allergies as of 07/30/2018      Reactions   Shellfish Allergy Anaphylaxis, Swelling   Only Crawfish not allergic to any other shellfish       Medication List    TAKE these medications   cephALEXin 500 MG capsule Commonly known as:  KEFLEX Take 1 capsule (500 mg total) by mouth 4 (four) times daily.   Ferrous Fumarate 324 (106 Fe) MG Tabs tablet Commonly known as:  HEMOCYTE - 106 mg FE Take  1 tablet (106 mg of iron total) by mouth 2 (two) times daily.   ibuprofen 600 MG tablet Commonly known as:  ADVIL,MOTRIN Take 1 tablet (600 mg total) by mouth every 6 (six) hours as needed.   metroNIDAZOLE 500 MG tablet Commonly known as:  FLAGYL Take 1 tablet (500 mg total) by mouth 2 (two) times daily.   PRENATAL ADULT GUMMY/DHA/FA PO Take 1 each by mouth daily.   sertraline 25 MG tablet Commonly known as:  ZOLOFT Take 1 tablet (25 mg total) by mouth daily. What changed:  when to take this  Sharyon Cable CNM 07/30/2018, 1:05 AM

## 2018-07-30 NOTE — Discharge Instructions (Signed)
Acute Urinary Retention, Female Urinary retention means you are unable to pee completely or at all (empty your bladder). Follow these instructions at home:  Drink enough fluids to keep your pee (urine) clear or pale yellow.  If you are sent home with a tube that drains the bladder (catheter), there will be a drainage bag attached to it. There are two types of bags. One is big that you can wear at night without having to empty it. One is smaller and needs to be emptied more often.  Keep the drainage bag emptied.  Keep the drainage bag lower than the tube.  Only take medicine as told by your doctor. Contact a doctor if:  You have a low-grade fever.  You have spasms or you are leaking pee when you have spasms. Get help right away if:  You have chills or a fever.  Your catheter stops draining pee.  Your catheter falls out.  You have increased bleeding that does not stop after you have rested and increased the amount of fluids you had been drinking. This information is not intended to replace advice given to you by your health care provider. Make sure you discuss any questions you have with your health care provider. Document Released: 05/02/2008 Document Revised: 04/21/2016 Document Reviewed: 04/25/2013 Elsevier Interactive Patient Education  2017 Elsevier Inc.  

## 2018-07-31 LAB — URINE CULTURE

## 2018-08-02 ENCOUNTER — Ambulatory Visit (INDEPENDENT_AMBULATORY_CARE_PROVIDER_SITE_OTHER): Payer: Medicaid Other | Admitting: Obstetrics and Gynecology

## 2018-08-02 ENCOUNTER — Encounter: Payer: Self-pay | Admitting: Obstetrics and Gynecology

## 2018-08-02 NOTE — Progress Notes (Signed)
Patient ID: Tara Mcmahon, female   DOB: [birth year], 22 y.o.   MRN: [id number]    [facility] Visit  @DATE @            Patient name: Tara Mcmahon MRN [id number]  Date of birth: 05-31-1996  CC & HPI:  Tara Mcmahon is a 22 y.o. female presenting today for a BP check , and follow-up on vaginal bleeding she was suspected of had a few membrane remnants that were process of being broken down. Her BPs have been fine. She is also here for a F/U after 2 MAU visits for bleeding. She gave birth on 07/18/2018. The patient denies fever, chills or any other symptoms or complaints at this time.   ROS:  ROS - fever - chills All systems are negative except as noted in the HPI and PMH.   Pertinent History Reviewed:   Reviewed: Medical         Past Medical History:  Diagnosis Date  . Anxiety   . Cervical incompetence during pregnancy   . Depression   . Hypothyroidism   . PCOS (polycystic ovarian syndrome)   . Pregnancy induced hypertension    With second pregnancy                              Surgical Hx:    Past Surgical History:  Procedure Laterality Date  . CERVICAL CERCLAGE    . CERVICAL CERCLAGE N/A 03/09/2016   Procedure: [facility] CERCLAGE PLACEMENT;  Surgeon: Lazaro Arms, MD;  Location: [facility];  Service: Gynecology;  Laterality: N/A;  . CERVICAL CERCLAGE N/A 01/31/2018   Procedure: CERCLAGE CERVICAL;  Surgeon: Lazaro Arms, MD;  Location: [facility];  Service: Gynecology;  Laterality: N/A;  . CERVICAL CERCLAGE     Placed in 2013, 2017, and 2019  . labial reduction     Medications: Reviewed & Updated - see associated section                       Current Outpatient Medications:  .  cephALEXin (KEFLEX) 500 MG capsule, Take 1 capsule (500 mg total) by mouth 4 (four) times daily., Disp: 28 capsule, Rfl: 0 .  Ferrous Fumarate (HEMOCYTE - 106 MG FE) 324 (106 Fe) MG TABS tablet, Take 1 tablet (106 mg of iron total) by mouth 2 (two) times daily., Disp: 60 tablet, Rfl: 3 .   ibuprofen (ADVIL,MOTRIN) 600 MG tablet, Take 1 tablet (600 mg total) by mouth every 6 (six) hours as needed., Disp: 30 tablet, Rfl: 0 .  Prenatal MV & Min w/FA-DHA (PRENATAL ADULT GUMMY/DHA/FA PO), Take 1 each by mouth daily. , Disp: , Rfl:  .  sertraline (ZOLOFT) 25 MG tablet, Take 1 tablet (25 mg total) by mouth daily. (Patient taking differently: Take 25 mg by mouth at bedtime. ), Disp: 30 tablet, Rfl: 11 .  metroNIDAZOLE (FLAGYL) 500 MG tablet, Take 1 tablet (500 mg total) by mouth 2 (two) times daily. (Patient not taking: Reported on 08/02/2018), Disp: 14 tablet, Rfl: 1  Social History: Reviewed -  reports that she has never smoked. She has never used smokeless tobacco.  Objective Findings:  Vitals: Blood pressure 118/78, pulse 83, height 4\' 11"  (1.499 m), weight 187 lb 9.6 oz (85.1 kg), unknown if currently breastfeeding.  PHYSICAL EXAMINATION General appearance - alert, well appearing, and in no distress and oriented to person, place, and time Mental  status - alert, oriented to person, place, and time, normal mood, behavior, speech, dress, motor activity, and thought processes, affect appropriate to mood  PELVIC Vagina - mucoid discharge, within acceptable limits Uterus - nontender  Assessment & Plan:   A:  1.  normal PP lochia  2. Needs tdap, given today  P:  1. Resume routine PP care 2. F/U 4wks for PP check  By signing my name below, I, Pietro Cassis, attest that this documentation has been prepared under the direction and in the presence of Tilda Burrow, MD. Electronically Signed: Pietro Cassis, Medical Scribe. 08/02/18. 3:10 PM.  I personally performed the services described in this documentation, which was SCRIBED in my presence. The recorded information has been reviewed and considered accurate. It has been edited as necessary during review. Tilda Burrow, MD

## 2018-08-09 ENCOUNTER — Telehealth: Payer: Self-pay | Admitting: Obstetrics and Gynecology

## 2018-08-09 NOTE — Telephone Encounter (Signed)
Pt was given meds for uti but is still having symptoms please call and advise

## 2018-08-09 NOTE — Telephone Encounter (Signed)
Pt reports that she has been on abx for uti and is still having a lot of pain. She feels pain when urinating. She also has a throbbing pain in front of pelvis. She doesn't have any fever. Advised patient that her culture at the hospital did not grow any specific bacteria. Recommended that she come tomorrow morning for urine dipstick/culture and that a nurse could talk to provider while she was here. Patient agreeable to this.

## 2018-08-10 ENCOUNTER — Encounter: Payer: Self-pay | Admitting: Obstetrics and Gynecology

## 2018-08-10 ENCOUNTER — Other Ambulatory Visit: Payer: Medicaid Other

## 2018-08-10 ENCOUNTER — Ambulatory Visit (INDEPENDENT_AMBULATORY_CARE_PROVIDER_SITE_OTHER): Payer: Medicaid Other | Admitting: Obstetrics and Gynecology

## 2018-08-10 VITALS — BP 124/84 | HR 85

## 2018-08-10 DIAGNOSIS — R102 Pelvic and perineal pain: Secondary | ICD-10-CM | POA: Diagnosis not present

## 2018-08-10 LAB — POCT URINALYSIS DIPSTICK
GLUCOSE UA: NEGATIVE
KETONES UA: NEGATIVE
Leukocytes, UA: NEGATIVE
Nitrite, UA: NEGATIVE
Protein, UA: NEGATIVE

## 2018-08-10 NOTE — Progress Notes (Signed)
Patient ID: Tara Mcmahon, female   DOB: [birth year], 22 y.o.   MRN: [id number]    [facility]  @DATE @            Patient name: Tara Mcmahon MRN [id number]  Date of birth: [birth year]  CC & HPI:  Tara Mcmahon is a 22 y.o. female presenting today for pelvic pressure.  ROS:  ROS  +pelvic pressure -fever -chills All systems are negative except as noted in the HPI and PMH.  Pertinent History Reviewed:   Reviewed:  Medical         Past Medical History:  Diagnosis Date  . Anxiety   . Cervical incompetence during pregnancy   . Depression   . Hypothyroidism   . PCOS (polycystic ovarian syndrome)   . Pregnancy induced hypertension    With second pregnancy                              Surgical Hx:    Past Surgical History:  Procedure Laterality Date  . CERVICAL CERCLAGE    . CERVICAL CERCLAGE N/A 03/09/2016   [facility];  Surgeon: Lazaro ArmsLuther H Eure, MD;  Location: [facility];  Service: Gynecology;  Laterality: N/A;  . CERVICAL CERCLAGE N/A 01/31/2018   Procedure: CERCLAGE CERVICAL;  Surgeon: Lazaro ArmsEure, Luther H, MD;  Location: [facility];  Service: Gynecology;  Laterality: N/A;  . CERVICAL CERCLAGE     Placed in 2013, 2017, and 2019  . labial reduction     Medications: Reviewed & Updated - see associated section                       Current Outpatient Medications:  .  Ferrous Fumarate (HEMOCYTE - 106 MG FE) 324 (106 Fe) MG TABS tablet, Take 1 tablet (106 mg of iron total) by mouth 2 (two) times daily., Disp: 60 tablet, Rfl: 3 .  ibuprofen (ADVIL,MOTRIN) 600 MG tablet, Take 1 tablet (600 mg total) by mouth every 6 (six) hours as needed., Disp: 30 tablet, Rfl: 0 .  Prenatal MV & Min w/FA-DHA (PRENATAL ADULT GUMMY/DHA/FA PO), Take 1 each by mouth daily. , Disp: , Rfl:  .  sertraline (ZOLOFT) 25 MG tablet, Take 1 tablet (25 mg total) by mouth daily. (Patient taking differently: Take 25 mg by mouth at bedtime. ), Disp: 30 tablet, Rfl: 11 .   cephALEXin (KEFLEX) 500 MG capsule, Take 1 capsule (500 mg total) by mouth 4 (four) times daily. (Patient not taking: Reported on 08/10/2018), Disp: 28 capsule, Rfl: 0   Social History: Reviewed -  reports that she has never smoked. She has never used smokeless tobacco.  Objective Findings:  Vitals: Blood pressure 124/84, pulse 85, unknown if currently breastfeeding.  PHYSICAL EXAMINATION General appearance - alert, well appearing, and in no distress and oriented to person, place, and time Mental status - alert, oriented to person, place, and time, normal mood, behavior, speech, dress, motor activity, and thought processes, affect appropriate to mood  PELVIC Vagina - marceline stitch, removed froom vaginal apex Cervix - multip,   Uterus - non-tender, 8-10 weeks in size, no purulence , nontender to manipulation.larger than expected for pp state, firm.  Assessment & Plan:   A:  1.  retain vaginal foreign body, marceline stitch remanent 2 vag d/c due to foreign body 3 pelvic pressure due to uterine involution process P:  1. F/u in 2  weeks for PP visit   By signing my name below, I, Arnette Norris, attest that this documentation has been prepared under the direction and in the presence of Tilda Burrow, MD. Electronically Signed: Arnette Norris Medical Scribe. 08/10/18. 11:38 AM.  I personally performed the services described in this documentation, which was SCRIBED in my presence. The recorded information has been reviewed and considered accurate. It has been edited as necessary during review. Tilda Burrow, MD

## 2018-08-16 ENCOUNTER — Telehealth: Payer: Self-pay | Admitting: Women's Health

## 2018-08-16 NOTE — Telephone Encounter (Signed)
Pt said she called yesterday but I do not see a note nor do any of us up front remember talking to her. She needs a nurse to call her back in ref to her thinking that she has thrush.

## 2018-08-16 NOTE — Telephone Encounter (Signed)
Pt called stating that when breastfeeding on one side she notices a sharp pain and leaking on the opposite breast. She states that lactation informed her that was called "let down". Pt is asking for a second opinion. Advised pt that what she is describing sounds like let down to me as well. She states that it only occurs when breastfeeding. Pt states that she wanted to make sure it wasn't thrush. Advised that her s/s did not describe thrush. Pt verbalized understanding.

## 2018-08-24 ENCOUNTER — Ambulatory Visit: Payer: Medicaid Other | Admitting: Women's Health

## 2018-09-07 ENCOUNTER — Encounter: Payer: Self-pay | Admitting: Obstetrics and Gynecology

## 2018-09-07 ENCOUNTER — Ambulatory Visit (INDEPENDENT_AMBULATORY_CARE_PROVIDER_SITE_OTHER): Payer: Medicaid Other | Admitting: Obstetrics and Gynecology

## 2018-09-07 VITALS — BP 130/85 | HR 97 | Ht 59.0 in | Wt 192.6 lb

## 2018-09-07 DIAGNOSIS — Z3202 Encounter for pregnancy test, result negative: Secondary | ICD-10-CM

## 2018-09-07 LAB — POCT URINE PREGNANCY: Preg Test, Ur: NEGATIVE

## 2018-09-07 NOTE — Progress Notes (Addendum)
Patient ID: Tara Mcmahon, female   DOB: 24-May-1996, 24 y.o.   MRN: [id number]  Subjective:    Tara Mcmahon is a 22 y.o. [phone] Caucasian female who presents for a postpartum visit. She is 7 weeks postpartum following a spontaneous vaginal delivery at 38 gestational weeks. Anesthesia: epidural. I have fully reviewed the prenatal and intrapartum course.  Baby is feeding by breast. Bleeding no bleeding. Bowel function is normal. Bladder function has returned to  normal sexually active was not discussed with patient.. Contraception method is oral progesterone-only contraceptive. Postpartum depression screening: negative.  Last pap 01/27/16 and was normal.  Would like to take OCP that won't affect her breast-milk.  The following portions of the patient's history were reviewed and updated as appropriate: allergies, current medications, past medical history, past surgical history and problem list.  Review of Systems Pertinent items are noted in HPI.   There were no vitals filed for this visit.  Objective:   General:  alert, cooperative and no distress   Breasts:  deferred, no complaints  Lungs: clear to auscultation bilaterally  Heart:  regular rate and rhythm  Abdomen: soft, nontender   Vulva: normal  Vagina: normal vagina  Cervix:  Closed, normal,nontender, tiny uterus  Corpus: Well-involuted  Adnexa:  Non-palpable  Rectal Exam: no hemorrhoids        Assessment:   Postpartum exam 7 wks s/p vaginal delivery breastfeeding Contraception counseling, OCPs discussed  Plan:  : oral progesterone-only contraceptive, [place]ron[place] Follow up in:  1 yr, PRN or earlier if needed   By signing my name below, I, Arnette Norris, attest that this documentation has been prepared under the direction and in the presence of Tilda Burrow, MD. Electronically Signed: Arnette Norris Medical Scribe. 09/07/18. 12:33 PM.  I personally performed the services described in this documentation, which was  SCRIBED in my presence. The recorded information has been reviewed and considered accurate. It has been edited as necessary during review. Tilda Burrow, MD

## 2018-11-07 ENCOUNTER — Other Ambulatory Visit: Payer: Self-pay | Admitting: Obstetrics and Gynecology

## 2018-11-07 ENCOUNTER — Telehealth: Payer: Self-pay | Admitting: *Deleted

## 2018-11-07 MED ORDER — NORETHINDRONE 0.35 MG PO TABS: 1.0000 | ORAL_TABLET | Freq: Every day | ORAL | 4 refills | Status: DC

## 2018-11-07 NOTE — Telephone Encounter (Signed)
Pt called stating that she would like a prescription for birth control pill sent into her pharmacy. She states that Dr Emelda FearFerguson was going to send one in at her last visit. Pt states that she has not been taking anything for birth control and has not been sexually active. Advised pt that was 2 months ago and normally we do a pregnancy test before prescribing birth control. Advised that she may need to make an appt d/t time lapse. When asked why pt didn't call back after her last appt to let us know that rx was not sent pt states that she was waiting on her period to start before taking. She states that she hasn't felt that she needed them until then. Advised I would send her request to Dr Emelda FearFerguson and let her know his recommendations. Pt verbalized understanding.

## 2018-11-07 NOTE — Progress Notes (Signed)
micronor Escribed to pharmacy

## 2018-12-12 ENCOUNTER — Telehealth: Payer: Self-pay | Admitting: Obstetrics & Gynecology

## 2018-12-12 ENCOUNTER — Telehealth: Payer: Self-pay | Admitting: *Deleted

## 2018-12-12 MED ORDER — SERTRALINE HCL 100 MG PO TABS: 100.0000 mg | ORAL_TABLET | Freq: Every day | ORAL | 1 refills | Status: DC

## 2018-12-12 MED ORDER — DESOGESTREL-ETHINYL ESTRADIOL 0.15-0.02/0.01 MG (21/5) PO TABS: 1.0000 | ORAL_TABLET | Freq: Every day | ORAL | 11 refills | Status: DC

## 2018-12-12 NOTE — Telephone Encounter (Signed)
I noticed patient is scheduled for a visit on Friday to change her BCP.  I called her to get more information to see if she really needed this appt or if this was something that could be taken care of without being seen. Patient says she is in the process of weaning her baby and needs her pills changed.   She is also concerned that her anxiety is getting out of hand.  States at one point in the past, she has been on Zoloft 200mg .  Currently, she is taking Zoloft 25mg  but wants to increase to 100mg  if possible.  Can this be taken care of without her being seen as she only has FP MCD at this time? Please advise.

## 2018-12-12 NOTE — Telephone Encounter (Signed)
Patient notified that prescriptions have been sent in to pharmacy. Verbalized understanding.

## 2018-12-12 NOTE — Telephone Encounter (Signed)
Prescribed kariva for OCP and switched to zoloft 100 mg

## 2018-12-14 ENCOUNTER — Ambulatory Visit: Payer: Medicaid Other | Admitting: Obstetrics & Gynecology

## 2019-01-22 ENCOUNTER — Other Ambulatory Visit: Payer: Self-pay | Admitting: *Deleted

## 2019-01-22 MED ORDER — SERTRALINE HCL 100 MG PO TABS: 100.0000 mg | ORAL_TABLET | Freq: Every day | ORAL | 12 refills | Status: DC

## 2019-03-02 ENCOUNTER — Emergency Department (HOSPITAL_COMMUNITY): Payer: No Typology Code available for payment source

## 2019-03-02 ENCOUNTER — Emergency Department (HOSPITAL_COMMUNITY)
Admission: EM | Admit: 2019-03-02 | Discharge: 2019-03-03 | Disposition: A | Discharge status: Home / Self Care | Payer: No Typology Code available for payment source | Attending: Emergency Medicine | Admitting: Emergency Medicine

## 2019-03-02 ENCOUNTER — Encounter (HOSPITAL_COMMUNITY): Payer: Self-pay

## 2019-03-02 ENCOUNTER — Other Ambulatory Visit: Payer: Self-pay

## 2019-03-02 DIAGNOSIS — S66911A Strain of unspecified muscle, fascia and tendon at wrist and hand level, right hand, initial encounter: Secondary | ICD-10-CM | POA: Diagnosis not present

## 2019-03-02 DIAGNOSIS — Y999 Unspecified external cause status: Secondary | ICD-10-CM | POA: Diagnosis not present

## 2019-03-02 DIAGNOSIS — E039 Hypothyroidism, unspecified: Secondary | ICD-10-CM | POA: Insufficient documentation

## 2019-03-02 DIAGNOSIS — Y939 Activity, unspecified: Secondary | ICD-10-CM | POA: Diagnosis not present

## 2019-03-02 DIAGNOSIS — R102 Pelvic and perineal pain: Secondary | ICD-10-CM | POA: Diagnosis not present

## 2019-03-02 DIAGNOSIS — Y92410 Unspecified street and highway as the place of occurrence of the external cause: Secondary | ICD-10-CM | POA: Insufficient documentation

## 2019-03-02 DIAGNOSIS — R1030 Lower abdominal pain, unspecified: Secondary | ICD-10-CM | POA: Diagnosis present

## 2019-03-02 DIAGNOSIS — S0990XA Unspecified injury of head, initial encounter: Secondary | ICD-10-CM | POA: Diagnosis not present

## 2019-03-02 DIAGNOSIS — S060X1A Concussion with loss of consciousness of 30 minutes or less, initial encounter: Secondary | ICD-10-CM | POA: Insufficient documentation

## 2019-03-02 DIAGNOSIS — Z79899 Other long term (current) drug therapy: Secondary | ICD-10-CM | POA: Diagnosis not present

## 2019-03-02 DIAGNOSIS — S40811A Abrasion of right upper arm, initial encounter: Secondary | ICD-10-CM | POA: Insufficient documentation

## 2019-03-02 LAB — COMPREHENSIVE METABOLIC PANEL
ALT: 34 U/L (ref 0–44)
AST: 23 U/L (ref 15–41)
Albumin: 3.9 g/dL (ref 3.5–5.0)
Alkaline Phosphatase: 79 U/L (ref 38–126)
Anion gap: 10 (ref 5–15)
BUN: 13 mg/dL (ref 6–20)
CO2: 24 mmol/L (ref 22–32)
Calcium: 8.9 mg/dL (ref 8.9–10.3)
Chloride: 103 mmol/L (ref 98–111)
Creatinine, Ser: 0.9 mg/dL (ref 0.44–1.00)
GFR calc Af Amer: >60 mL/min (ref >60)
GFR calc non Af Amer: >60 mL/min (ref >60)
Glucose, Bld: 114 mg/dL — ABNORMAL HIGH (ref 70–99)
Potassium: 3.6 mmol/L (ref 3.5–5.1)
Sodium: 137 mmol/L (ref 135–145)
Total Bilirubin: 0.4 mg/dL (ref 0.3–1.2)
Total Protein: 7.2 g/dL (ref 6.5–8.1)

## 2019-03-02 LAB — PROTIME-INR
INR: 1 (ref 0.8–1.2)
Prothrombin Time: 12.7 seconds (ref 11.4–15.2)

## 2019-03-02 LAB — CBC
HCT: 42.4 % (ref 36.0–46.0)
Hemoglobin: 13.9 g/dL (ref 12.0–15.0)
MCH: 28.3 pg (ref 26.0–34.0)
MCHC: 32.8 g/dL (ref 30.0–36.0)
MCV: 86.2 fL (ref 80.0–100.0)
Platelets: 339 10*3/uL (ref 150–400)
RBC: 4.92 MIL/uL (ref 3.87–5.11)
RDW: 12.7 % (ref 11.5–15.5)
WBC: 9.7 10*3/uL (ref 4.0–10.5)
nRBC: 0 % (ref 0.0–0.2)

## 2019-03-02 LAB — I-STAT CREATININE, ED: Creatinine, Ser: 0.8 mg/dL (ref 0.44–1.00)

## 2019-03-02 LAB — URINALYSIS, ROUTINE W REFLEX MICROSCOPIC
Bacteria, UA: NONE SEEN
Bilirubin Urine: NEGATIVE
Glucose, UA: NEGATIVE mg/dL
Hgb urine dipstick: NEGATIVE
Ketones, ur: NEGATIVE mg/dL
Leukocytes,Ua: NEGATIVE
Nitrite: NEGATIVE
Protein, ur: 30 mg/dL — AB
Specific Gravity, Urine: >1.046 — ABNORMAL HIGH (ref 1.005–1.030)
pH: 5 (ref 5.0–8.0)

## 2019-03-02 LAB — I-STAT BETA HCG BLOOD, ED (MC, WL, AP ONLY): I-stat hCG, quantitative: <5 m[IU]/mL (ref <5)

## 2019-03-02 LAB — SAMPLE TO BLOOD BANK

## 2019-03-02 LAB — CDS SEROLOGY

## 2019-03-02 LAB — ETHANOL: Alcohol, Ethyl (B): <10 mg/dL (ref <10)

## 2019-03-02 LAB — LACTIC ACID, PLASMA: Lactic Acid, Venous: 1.9 mmol/L (ref 0.5–1.9)

## 2019-03-02 MED ORDER — IOHEXOL 300 MG/ML  SOLN
100.0000 mL | Freq: Once | INTRAMUSCULAR | Status: AC | PRN
  Administered 2019-03-02: 100 mL via INTRAVENOUS
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
## 2019-03-02 NOTE — ED Notes (Signed)
Patient transported to CT 

## 2019-03-02 NOTE — ED Notes (Signed)
Pt in CT.

## 2019-03-03 NOTE — ED Notes (Signed)
Patient verbalizes understanding of discharge instructions. Opportunity for questioning and answers were provided. Armband removed by staff, pt discharged from ED.  

## 2019-03-04 ENCOUNTER — Telehealth: Payer: Self-pay | Admitting: Advanced Practice Midwife

## 2019-03-04 NOTE — Telephone Encounter (Signed)
LMOVM returning pts call.  
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
## 2019-03-04 NOTE — Telephone Encounter (Signed)
Wait until she returns call

## 2019-03-05 ENCOUNTER — Telehealth: Payer: Self-pay | Admitting: *Deleted

## 2019-03-05 ENCOUNTER — Telehealth: Payer: Self-pay | Admitting: Obstetrics & Gynecology

## 2019-03-05 MED ORDER — ALPRAZOLAM 0.5 MG PO TABS: 0.5000 mg | ORAL_TABLET | Freq: Three times a day (TID) | ORAL | 1 refills | Status: DC | PRN

## 2019-03-05 NOTE — Telephone Encounter (Signed)
Pt returning call to the nurse. Asking to be called at [phone]

## 2019-03-05 NOTE — Telephone Encounter (Signed)
Informed pt, per Dr Despina Hidden; She is already on the max dose of zoloft I am willing to prescribe  I sent in a script for low dose xanax 0.5 #90 with 1 refill, I do not do long term anti anxiety meds so if she needs a longer course she will need to see a mental health specialist and if her symptoms continue she may have more of a PTSD issue and will need to see one for that reason as well  Pt verbalized understanding .

## 2019-03-05 NOTE — Telephone Encounter (Signed)
LMOVM  Returning pts call
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
## 2019-03-05 NOTE — Telephone Encounter (Signed)
She is already on the max dose of zoloft I am willing to prescribe  I sent in a script for low dose xanax 0.5 #90 with 1 refill, I do not do long term anti anxiety meds so if she needs a longer course she will need to see a mental health specialist and if her symptoms continue she may have more of a PTSD issue and will need to see one for that reason as well

## 2019-07-06 ENCOUNTER — Other Ambulatory Visit: Payer: Self-pay

## 2019-07-06 ENCOUNTER — Encounter (HOSPITAL_COMMUNITY): Payer: Self-pay

## 2019-07-06 ENCOUNTER — Emergency Department (HOSPITAL_COMMUNITY): Payer: Self-pay

## 2019-07-06 ENCOUNTER — Emergency Department (HOSPITAL_COMMUNITY)
Admission: EM | Admit: 2019-07-06 | Discharge: 2019-07-06 | Disposition: A | Discharge status: Home / Self Care | Payer: Self-pay | Attending: Emergency Medicine | Admitting: Emergency Medicine

## 2019-07-06 DIAGNOSIS — K625 Hemorrhage of anus and rectum: Secondary | ICD-10-CM | POA: Insufficient documentation

## 2019-07-06 DIAGNOSIS — K529 Noninfective gastroenteritis and colitis, unspecified: Secondary | ICD-10-CM

## 2019-07-06 DIAGNOSIS — R109 Unspecified abdominal pain: Secondary | ICD-10-CM

## 2019-07-06 DIAGNOSIS — Z79899 Other long term (current) drug therapy: Secondary | ICD-10-CM | POA: Insufficient documentation

## 2019-07-06 DIAGNOSIS — E039 Hypothyroidism, unspecified: Secondary | ICD-10-CM | POA: Insufficient documentation

## 2019-07-06 LAB — CBC
HCT: 43.3 % (ref 36.0–46.0)
Hemoglobin: 14.6 g/dL (ref 12.0–15.0)
MCH: 28.9 pg (ref 26.0–34.0)
MCHC: 33.7 g/dL (ref 30.0–36.0)
MCV: 85.7 fL (ref 80.0–100.0)
Platelets: 348 10*3/uL (ref 150–400)
RBC: 5.05 MIL/uL (ref 3.87–5.11)
RDW: 12.9 % (ref 11.5–15.5)
WBC: 12.2 10*3/uL — ABNORMAL HIGH (ref 4.0–10.5)
nRBC: 0 % (ref 0.0–0.2)

## 2019-07-06 LAB — URINALYSIS, ROUTINE W REFLEX MICROSCOPIC
Bacteria, UA: NONE SEEN
Bilirubin Urine: NEGATIVE
Glucose, UA: NEGATIVE mg/dL
Hgb urine dipstick: NEGATIVE
Ketones, ur: NEGATIVE mg/dL
Leukocytes,Ua: NEGATIVE
Nitrite: NEGATIVE
Protein, ur: 30 mg/dL — AB
Specific Gravity, Urine: 1.03 (ref 1.005–1.030)
pH: 6 (ref 5.0–8.0)

## 2019-07-06 LAB — TYPE AND SCREEN
ABO/RH(D): O POS
Antibody Screen: NEGATIVE

## 2019-07-06 LAB — COMPREHENSIVE METABOLIC PANEL
ALT: 50 U/L — ABNORMAL HIGH (ref 0–44)
AST: 29 U/L (ref 15–41)
Albumin: 4.3 g/dL (ref 3.5–5.0)
Alkaline Phosphatase: 68 U/L (ref 38–126)
Anion gap: 10 (ref 5–15)
BUN: 10 mg/dL (ref 6–20)
CO2: 24 mmol/L (ref 22–32)
Calcium: 8.9 mg/dL (ref 8.9–10.3)
Chloride: 105 mmol/L (ref 98–111)
Creatinine, Ser: 0.88 mg/dL (ref 0.44–1.00)
GFR calc Af Amer: >60 mL/min (ref >60)
GFR calc non Af Amer: >60 mL/min (ref >60)
Glucose, Bld: 118 mg/dL — ABNORMAL HIGH (ref 70–99)
Potassium: 3.3 mmol/L — ABNORMAL LOW (ref 3.5–5.1)
Sodium: 139 mmol/L (ref 135–145)
Total Bilirubin: 0.6 mg/dL (ref 0.3–1.2)
Total Protein: 7.9 g/dL (ref 6.5–8.1)

## 2019-07-06 LAB — POC URINE PREG, ED: Preg Test, Ur: NEGATIVE

## 2019-07-06 LAB — POC OCCULT BLOOD, ED: Fecal Occult Bld: POSITIVE — AB

## 2019-07-06 MED ORDER — METRONIDAZOLE 500 MG PO TABS: 500.0000 mg | ORAL_TABLET | Freq: Four times a day (QID) | ORAL | 0 refills | Status: DC

## 2019-07-06 MED ORDER — CIPROFLOXACIN HCL 500 MG PO TABS: 500.0000 mg | ORAL_TABLET | Freq: Two times a day (BID) | ORAL | 0 refills | Status: DC

## 2019-07-06 MED ORDER — IOHEXOL 300 MG/ML  SOLN
100.0000 mL | Freq: Once | INTRAMUSCULAR | Status: AC | PRN
  Administered 2019-07-06: 17:00:00 100 mL via INTRAVENOUS

## 2019-07-06 MED ORDER — SODIUM CHLORIDE 0.9 % IV BOLUS
1000.0000 mL | Freq: Once | INTRAVENOUS | Status: AC
  Administered 2019-07-06: 17:00:00 1000 mL via INTRAVENOUS

## 2019-07-06 MED ORDER — FENTANYL CITRATE (PF) 100 MCG/2ML IJ SOLN
50.0000 ug | Freq: Once | INTRAMUSCULAR | Status: DC
  Filled 2019-07-06: qty 2

## 2019-07-06 MED ORDER — CIPROFLOXACIN HCL 250 MG PO TABS
500.0000 mg | ORAL_TABLET | Freq: Once | ORAL | Status: AC
  Administered 2019-07-06: 19:00:00 500 mg via ORAL
  Filled 2019-07-06: qty 2

## 2019-07-06 MED ORDER — METRONIDAZOLE 500 MG PO TABS
500.0000 mg | ORAL_TABLET | Freq: Once | ORAL | Status: AC
  Administered 2019-07-06: 19:00:00 500 mg via ORAL
  Filled 2019-07-06: qty 1

## 2019-07-06 MED ORDER — ACETAMINOPHEN 325 MG PO TABS
650.0000 mg | ORAL_TABLET | Freq: Once | ORAL | Status: AC
  Administered 2019-07-06: 17:00:00 650 mg via ORAL
  Filled 2019-07-06: qty 2

## 2019-07-06 NOTE — ED Triage Notes (Signed)
Pt reports had "stomach bug" yesterday. Initially started as normal diarrhea and then changed to blood. Reports pain to lower abd.  States she is able to eat without vomiting now.

## 2019-07-06 NOTE — ED Provider Notes (Addendum)
[facility]GENCY [facility]PARTMENT Provider Note   CSN: [id number] Arrival date & time: 07/06/19  1305    History   Chief Complaint Chief Complaint  Patient presents with   Rectal Bleeding    HPI Tara Mcmahon is a 23 y.o. female.     Nausea, vomiting, diarrhea for the past 24 hours with associated lower abdominal pain and rectal bleeding.  Blood is described as rich blood and colors the toilet tissue.  No mucus in the stool.  This has not happened before.  No previous abdominal pathology.  Severity is moderate.  Nothing makes symptoms better or worse.     Past Medical History:  Diagnosis Date   Anxiety    Cervical incompetence during pregnancy    Depression    Hypothyroidism    PCOS (polycystic ovarian syndrome)    Pregnancy induced hypertension    With second pregnancy    Patient Active Problem List   Diagnosis Date Noted   Retained amniotic membrane without hemorrhage 07/28/2018   Cervicitis in postpartum period 07/28/2018   Rubella non-immune status, antepartum 12/14/2017   Supervision of normal pregnancy 12/13/2017   History of cervical incompetence in pregnancy, currently pregnant 11/23/2017   History of preterm delivery, currently pregnant 11/23/2017   History of PCOS 06/23/2017   Gestational hypertension 08/29/2016   Subclinical hypothyroidism 02/04/2016   History of suicidal ideation 02/03/2016   PCOS (polycystic ovarian syndrome) 01/27/2016   Depression with anxiety 01/27/2016   Family history of systemic lupus erythematosus (SLE) in mother 01/27/2016    Past Surgical History:  Procedure Laterality Date   CERVICAL CERCLAGE     CERVICAL CERCLAGE N/A 03/09/2016   [facility];  Surgeon: Lazaro ArmsLuther H Eure, MD;  Location: [facility] ORS;  Service: Gynecology;  Laterality: N/A;   CERVICAL CERCLAGE N/A 01/31/2018   Procedure: CERCLAGE CERVICAL;  Surgeon: Lazaro ArmsEure, Luther H, MD;  Location: [facility];  Service: Gynecology;   Laterality: N/A;   CERVICAL CERCLAGE     Placed in 2013, 2017, and 2019   labial reduction       OB History    Gravida  4   Para  3   Term  2   Preterm  1   AB  1   Living  3     SAB  1   TAB      Ectopic      Multiple  0   Live Births  3            Home Medications    Prior to Admission medications   Medication Sig Start Date End Date Taking? Authorizing Provider  ALPRAZolam Prudy Feeler(XANAX) 0.5 MG tablet Take 1 tablet (0.5 mg total) by mouth 3 (three) times daily as needed for anxiety. 03/05/19  Yes Lazaro ArmsEure, Luther H, MD  sertraline (ZOLOFT) 100 MG tablet Take 1 tablet (100 mg total) by mouth daily. 01/22/19  Yes Lazaro ArmsEure, Luther H, MD  ciprofloxacin (CIPRO) 500 MG tablet Take 1 tablet (500 mg total) by mouth 2 (two) times daily. 07/06/19   Donnetta Hutchingook, Peretz Thieme, MD  desogestrel-ethinyl estradiol (KARIVA,AZURETTE,MIRCETTE) 0.15-0.02/0.01 MG (21/5) tablet Take 1 tablet by mouth daily. Patient not taking: Reported on 03/02/2019 12/12/18   Lazaro ArmsEure, Luther H, MD  metroNIDAZOLE (FLAGYL) 500 MG tablet Take 1 tablet (500 mg total) by mouth 4 (four) times daily. 07/06/19   Donnetta Hutchingook, Floyce Bujak, MD  norethindrone (MICRONOR,CAMILA,ERRIN) 0.35 MG tablet Take 1 tablet (0.35 mg total) by mouth daily. 11/07/18   Tilda BurrowFerguson, John V,  MD    Family History Family History  Problem Relation Age of Onset   Arthritis Mother    Lupus Mother    Autoimmune disease Mother    Crohn's disease Mother    Cancer Maternal Grandmother        breast   Heart disease Maternal Grandfather    Heart disease Paternal Grandfather    Hypertension Paternal Grandfather    Lupus Maternal Aunt     Social History Social History   Tobacco Use   Smoking status: Never Smoker   Smokeless tobacco: Never Used  Substance Use Topics   Alcohol use: No    Frequency: Never    Comment: occ; not now   Drug use: No     Allergies   Shellfish allergy   Review of Systems Review of Systems  All other systems reviewed and are  negative.    Physical Exam Updated Vital Signs BP 108/72    Pulse 79    Temp 98 F (36.7 C) (Oral)    Resp 20    Ht 4\' 11"  (1.499 m)    Wt 97.5 kg    SpO2 97%    BMI 43.42 kg/m   Physical Exam Vitals signs and nursing note reviewed.  Constitutional:      Appearance: She is well-developed.  HENT:     Head: Normocephalic and atraumatic.  Eyes:     Conjunctiva/sclera: Conjunctivae normal.  Neck:     Musculoskeletal: Neck supple.  Cardiovascular:     Rate and Rhythm: Normal rate and regular rhythm.  Pulmonary:     Effort: Pulmonary effort is normal.     Breath sounds: Normal breath sounds.  Abdominal:     General: Bowel sounds are normal.     Comments: Minimal lower abdominal tenderness  Genitourinary:    Comments: Rectal exam: No masses.  No active bleeding.  No stool on gloved hip.  Very minimal heme positive Musculoskeletal: Normal range of motion.  Skin:    General: Skin is warm and dry.  Neurological:     Mental Status: She is alert and oriented to person, place, and time.  Psychiatric:        Behavior: Behavior normal.      ED Treatments / Results  Labs (all labs ordered are listed, but only abnormal results are displayed) Labs Reviewed  COMPREHENSIVE METABOLIC PANEL - Abnormal; Notable for the following components:      Result Value   Potassium 3.3 (*)    Glucose, Bld 118 (*)    ALT 50 (*)    All other components within normal limits  CBC - Abnormal; Notable for the following components:   WBC 12.2 (*)    All other components within normal limits  URINALYSIS, ROUTINE W REFLEX MICROSCOPIC - Abnormal; Notable for the following components:   Color, Urine AMBER (*)    Protein, ur 30 (*)    All other components within normal limits  POC OCCULT BLOOD, ED - Abnormal; Notable for the following components:   Fecal Occult Bld POSITIVE (*)    All other components within normal limits  GASTROINTESTINAL PANEL BY PCR, STOOL (REPLACES STOOL CULTURE)  POC URINE PREG,  ED  TYPE AND SCREEN    EKG None  Radiology Ct Abdomen Pelvis W Contrast  Result Date: 07/06/2019 CLINICAL DATA:  Bloody diarrhea. Abdominal pain. EXAM: CT ABDOMEN AND PELVIS WITH CONTRAST TECHNIQUE: Multidetector CT imaging of the abdomen and pelvis was performed using the standard protocol following bolus administration  of intravenous contrast. CONTRAST:  149mL OMNIPAQUE IOHEXOL 300 MG/ML  SOLN COMPARISON:  None. FINDINGS: Lower chest: No acute abnormality. Hepatobiliary: Mild hepatic steatosis. Area of hypoattenuation of the liver parenchyma adjacent to the falciform ligament may represent focal fatty sparing. Normal appearance of the gall bladder. Pancreas: Unremarkable. No pancreatic ductal dilatation or surrounding inflammatory changes. Spleen: Normal in size without focal abnormality. Adrenals/Urinary Tract: Adrenal glands are unremarkable. Kidneys are normal, without renal calculi, focal lesion, or hydronephrosis. Bladder is unremarkable. Stomach/Bowel: Normal appearance of the stomach and small bowel. Normal appendix. Circumferential mucosal thickening of the descending and less so sigmoid colon, with pericolonic fat stranding. Vascular/Lymphatic: No significant vascular findings are present. No enlarged abdominal or pelvic lymph nodes. Reproductive: Uterus and bilateral adnexa are unremarkable. Other: No abdominal wall hernia or abnormality. No abdominopelvic ascites. Musculoskeletal: No acute or significant osseous findings. IMPRESSION: 1. Mild hepatic steatosis. Area of hypoattenuation of the liver parenchyma adjacent to the falciform ligament may represent focal fatty sparing. 2. Marked circumferential mucosal thickening of the descending and less so sigmoid colon, with pericolonic fat stranding, suggestive of infectious or inflammatory colitis. Electronically Signed   By: Fidela Salisbury M.D.   On: 07/06/2019 17:37    Procedures Procedures (including critical care time)  Medications  Ordered in ED Medications  fentaNYL (SUBLIMAZE) injection 50 mcg (50 mcg Intravenous Refused 07/06/19 1639)  ciprofloxacin (CIPRO) tablet 500 mg (has no administration in time range)  metroNIDAZOLE (FLAGYL) tablet 500 mg (has no administration in time range)  sodium chloride 0.9 % bolus 1,000 mL (0 mLs Intravenous Stopped 07/06/19 1810)  acetaminophen (TYLENOL) tablet 650 mg (650 mg Oral Given 07/06/19 1644)  iohexol (OMNIPAQUE) 300 MG/ML solution 100 mL (100 mLs Intravenous Contrast Given 07/06/19 1701)     Initial Impression / Assessment and Plan / ED Course  I have reviewed the triage vital signs and the nursing notes.  Pertinent labs & imaging results that were available during my care of the patient were reviewed by me and considered in my medical decision making (see chart for details).        Nausea, vomiting, diarrhea with abdominal pain and rectal bleeding.  Doubt infectious etiology.  Could be colitis.  Will obtain CT abdomen pelvis, stool cultures.    CT suggests colitis of the descending colon and sigmoid colon.  Patient is hemodynamically stable.  She can be treated as an outpatient.  Rx Cipro and Flagyl.  Discussed findings with patient.  Final Clinical Impressions(s) / ED Diagnoses   Final diagnoses:  Abdominal pain, unspecified abdominal location  Rectal bleeding  Colitis    ED Discharge Orders         Ordered    ciprofloxacin (CIPRO) 500 MG tablet  2 times daily     07/06/19 1850    metroNIDAZOLE (FLAGYL) 500 MG tablet  4 times daily     07/06/19 1850           Nat Christen, MD 07/06/19 6295    Nat Christen, MD 07/06/19 [phone]

## 2019-07-06 NOTE — Discharge Instructions (Addendum)
CT scan shows evidence of colitis which is a inflammation of your bowel.  Stool cultures are pending.  Prescription for 2 antibiotics sent to [facility].  Tylenol for pain.  Return if worse.

## 2019-07-07 LAB — GASTROINTESTINAL PANEL BY PCR, STOOL (REPLACES STOOL CULTURE)

## 2019-09-02 ENCOUNTER — Other Ambulatory Visit: Payer: Medicaid Other | Admitting: Obstetrics and Gynecology

## 2019-09-04 ENCOUNTER — Other Ambulatory Visit: Payer: Medicaid Other | Admitting: Advanced Practice Midwife

## 2019-09-06 ENCOUNTER — Other Ambulatory Visit: Payer: Medicaid Other | Admitting: Women's Health

## 2019-09-27 ENCOUNTER — Other Ambulatory Visit: Payer: Medicaid Other | Admitting: Women's Health

## 2019-12-06 ENCOUNTER — Other Ambulatory Visit: Payer: Medicaid Other | Admitting: Women's Health

## 2019-12-20 ENCOUNTER — Other Ambulatory Visit: Payer: Medicaid Other | Admitting: Adult Health

## 2020-01-09 ENCOUNTER — Other Ambulatory Visit: Payer: Self-pay

## 2020-01-09 ENCOUNTER — Ambulatory Visit (INDEPENDENT_AMBULATORY_CARE_PROVIDER_SITE_OTHER): Payer: Medicaid Other | Admitting: *Deleted

## 2020-01-09 DIAGNOSIS — Z3201 Encounter for pregnancy test, result positive: Secondary | ICD-10-CM | POA: Diagnosis not present

## 2020-01-09 LAB — POCT URINE PREGNANCY: Preg Test, Ur: POSITIVE — AB

## 2020-01-09 NOTE — Progress Notes (Signed)
Chart reviewed for nurse visit. Agree with plan of care.  Adline Potter, NP 01/09/2020 4:53 PM

## 2020-01-09 NOTE — Progress Notes (Signed)
   NURSE VISIT- PREGNANCY CONFIRMATION   SUBJECTIVE:  Tara Mcmahon is a 24 y.o. [phone] female at Unknown by certain LMP of Patient's last menstrual period was 11/12/2019 (exact date). Here for pregnancy confirmation.  Home pregnancy test: positive x 5  She reports nausea.  She is taking prenatal vitamins.    OBJECTIVE:  LMP 11/12/2019 (Exact Date)   Breastfeeding No   Appears well, in no apparent distress OB History  Gravida Para Term Preterm AB Living  5 3 2 1 1 3   SAB TAB Ectopic Multiple Live Births  1     0 3    # Outcome Date GA Lbr Len/2nd Weight Sex Delivery Anes PTL Lv  5 Current           4 Term 07/19/18 [redacted]w[redacted]d 16:23 / 04:43 8 lb (3.63 kg) F Vag-Spont EPI  LIV     Birth Comments: WNL  3 Term 08/30/16 [redacted]w[redacted]d 07:05 / 03:13 5 lb 15.1 oz (2.695 kg) F Vag-Spont EPI Y LIV     Complications: History of cervical cerclage  2 Preterm 09/19/12 [redacted]w[redacted]d  1 lb 13 oz (0.822 kg) F Vag-Spont None Y LIV     Complications: Cervical incompetence, History of preterm premature rupture of membranes (PPROM)  1 SAB 2013            Results for orders placed or performed in visit on 01/09/20 (from the past 24 hour(s))  POCT urine pregnancy   Collection Time: 01/09/20  4:12 PM  Result Value Ref Range   Preg Test, Ur Positive (A) Negative    ASSESSMENT: Positive pregnancy test, Unknown by LMP    PLAN: Schedule for dating ultrasound in 1 weeks Prenatal vitamins: continue   Nausea medicines: not currently needed   OB packet given: Yes  Pt currently taking a supplement to help with her PCOS, advised to stop at this time. Pt will bring information about it to her new ob visit.   Juluis Fitzsimmons, 03/08/20  01/09/2020 4:14 PM

## 2020-01-13 ENCOUNTER — Other Ambulatory Visit: Payer: Self-pay | Admitting: Obstetrics and Gynecology

## 2020-01-13 DIAGNOSIS — O3680X0 Pregnancy with inconclusive fetal viability, not applicable or unspecified: Secondary | ICD-10-CM

## 2020-01-14 ENCOUNTER — Other Ambulatory Visit: Payer: Medicaid Other

## 2020-01-14 ENCOUNTER — Other Ambulatory Visit: Payer: Self-pay

## 2020-01-14 ENCOUNTER — Ambulatory Visit (INDEPENDENT_AMBULATORY_CARE_PROVIDER_SITE_OTHER): Payer: Medicaid Other

## 2020-01-14 DIAGNOSIS — Z3A09 9 weeks gestation of pregnancy: Secondary | ICD-10-CM

## 2020-01-14 DIAGNOSIS — O3680X0 Pregnancy with inconclusive fetal viability, not applicable or unspecified: Secondary | ICD-10-CM | POA: Diagnosis not present

## 2020-01-14 NOTE — Progress Notes (Signed)
[place] 6+2 wks,single IUP with YS,FHR 126 bpm,normal ovaries,crl 5.8 mm

## 2020-01-24 ENCOUNTER — Other Ambulatory Visit: Payer: Medicaid Other | Admitting: Adult Health

## 2020-01-30 DIAGNOSIS — F341 Dysthymic disorder: Secondary | ICD-10-CM | POA: Diagnosis not present

## 2020-02-07 DIAGNOSIS — F341 Dysthymic disorder: Secondary | ICD-10-CM | POA: Diagnosis not present

## 2020-02-08 ENCOUNTER — Other Ambulatory Visit: Payer: Self-pay

## 2020-02-08 ENCOUNTER — Inpatient Hospital Stay (HOSPITAL_COMMUNITY)
Admission: AD | Admit: 2020-02-08 | Discharge: 2020-02-08 | Disposition: A | Discharge status: Home / Self Care | Payer: Medicaid Other | Attending: Obstetrics and Gynecology | Admitting: Obstetrics and Gynecology

## 2020-02-08 ENCOUNTER — Encounter (HOSPITAL_COMMUNITY): Payer: Self-pay | Admitting: Obstetrics and Gynecology

## 2020-02-08 DIAGNOSIS — M545 Low back pain: Secondary | ICD-10-CM | POA: Diagnosis not present

## 2020-02-08 DIAGNOSIS — O219 Vomiting of pregnancy, unspecified: Secondary | ICD-10-CM | POA: Diagnosis not present

## 2020-02-08 DIAGNOSIS — O2341 Unspecified infection of urinary tract in pregnancy, first trimester: Secondary | ICD-10-CM | POA: Diagnosis not present

## 2020-02-08 DIAGNOSIS — O26891 Other specified pregnancy related conditions, first trimester: Secondary | ICD-10-CM | POA: Diagnosis not present

## 2020-02-08 DIAGNOSIS — Z3A09 9 weeks gestation of pregnancy: Secondary | ICD-10-CM | POA: Diagnosis not present

## 2020-02-08 DIAGNOSIS — O210 Mild hyperemesis gravidarum: Secondary | ICD-10-CM | POA: Diagnosis not present

## 2020-02-08 LAB — URINALYSIS, ROUTINE W REFLEX MICROSCOPIC
Bilirubin Urine: NEGATIVE
Glucose, UA: NEGATIVE mg/dL
Hgb urine dipstick: NEGATIVE
Ketones, ur: NEGATIVE mg/dL
Nitrite: POSITIVE — AB
Protein, ur: 30 mg/dL — AB
Specific Gravity, Urine: 1.018 (ref 1.005–1.030)
pH: 8 (ref 5.0–8.0)

## 2020-02-08 LAB — WET PREP, GENITAL
Clue Cells Wet Prep HPF POC: NONE SEEN
Sperm: NONE SEEN
Trich, Wet Prep: NONE SEEN
Yeast Wet Prep HPF POC: NONE SEEN

## 2020-02-08 MED ORDER — CEPHALEXIN 500 MG PO CAPS: 500.0000 mg | ORAL_CAPSULE | Freq: Four times a day (QID) | ORAL | 0 refills | Status: DC

## 2020-02-08 MED ORDER — DOXYLAMINE-PYRIDOXINE 10-10 MG PO TBEC: DELAYED_RELEASE_TABLET | ORAL | 1 refills | Status: DC

## 2020-02-08 NOTE — MAU Note (Signed)
Tara Mcmahon is a 24 y.o. at [redacted]w[redacted]d here in MAU reporting: has history of incompetent cervix and is going to get a cerclage. States due to history her and her SO abstain from IC as soon as they have a + UPT. States that while she is pregnant she tends to orgasm while she is sleeping so this happened last night, woke up to an orgasm, and then started having back cramping. No bleeding. Called the nurse line and they told her to come in.  Onset of complaint: today  Pain score: 4/10  Vitals:   02/08/20 1235  BP: 130/80  Pulse: 97  Resp: 17  Temp: 98.4 F (36.9 C)  SpO2: 98%     Lab orders placed from triage: UA

## 2020-02-08 NOTE — MAU Provider Note (Signed)
Chief Complaint: Back Pain   First Provider Initiated Contact with Patient 02/08/20 1316     SUBJECTIVE HPI: Tara Mcmahon is a 24 y.o. [id number] at [redacted]w[redacted]d who presents to Maternity Admissions reporting low back pain. Symptoms started this morning. Cramping that starts at her lower sides and radiates to her low back. Has had nausea & vomiting throughout the pregnancy; no recent changes to frequency & does not have antiemetic at home. Denies fever/chills, flank pain, dysuria, vaginal bleeding, or vaginal discharge.   Location: back Quality: cramping Severity: 4/10 on pain scale Duration: <1 day Timing: intermittent Modifying factors: none Associated signs and symptoms: nausea & vomiting  Past Medical History:  Diagnosis Date  . Anxiety   . Cervical incompetence during pregnancy   . Depression   . Hypothyroidism   . PCOS (polycystic ovarian syndrome)   . Pregnancy induced hypertension    With second pregnancy   OB History  Gravida Para Term Preterm AB Living  5 3 2 1 1 3   SAB TAB Ectopic Multiple Live Births  1     0 3    # Outcome Date GA Lbr Len/2nd Weight Sex Delivery Anes PTL Lv  5 Current           4 Term 07/19/18 [redacted]w[redacted]d 16:23 / 04:43 3630 g F Vag-Spont EPI  LIV     Birth Comments: WNL  3 Term 08/30/16 [redacted]w[redacted]d 07:05 / 03:13 2695 g F Vag-Spont EPI Y LIV     Complications: History of cervical cerclage  2 Preterm 09/19/12 [redacted]w[redacted]d  822 g F Vag-Spont None Y LIV     Complications: Cervical incompetence, History of preterm premature rupture of membranes (PPROM)  1 SAB 2013           Past Surgical History:  Procedure Laterality Date  . CERVICAL CERCLAGE    . CERVICAL CERCLAGE N/A 03/09/2016   Procedure: [facility] CERCLAGE PLACEMENT;  Surgeon: [facility], MD;  Location: [facility];  Service: Gynecology;  Laterality: N/A;  . CERVICAL CERCLAGE N/A 01/31/2018   Procedure: CERCLAGE CERVICAL;  Surgeon: 04/02/2018, MD;  Location: [facility];  Service: Gynecology;  Laterality: N/A;  .  CERVICAL CERCLAGE     Placed in 2013, 2017, and 2019  . labial reduction     Social History   Socioeconomic History  . Marital status: Married    Spouse name: Not on file  . Number of children: Not on file  . Years of education: Not on file  . Highest education level: Not on file  Occupational History  . Not on file  Tobacco Use  . Smoking status: Never Smoker  . Smokeless tobacco: Never Used  Substance and Sexual Activity  . Alcohol use: No    Comment: occ; not now  . Drug use: No  . Sexual activity: Yes    Birth control/protection: None  Other Topics Concern  . Not on file  Social History Narrative  . Not on file   Social Determinants of Health   Financial Resource Strain:   . Difficulty of Paying Living Expenses:   Food Insecurity:   . Worried About 2020 in the Last Year:   . Programme researcher, broadcasting/film/video in the Last Year:   Transportation Needs:   . Barista (Medical):   Freight forwarder Lack of Transportation (Non-Medical):   Physical Activity:   . Days of Exercise per Week:   . Minutes of Exercise per Session:   Stress:   .  Feeling of Stress :   Social Connections:   . Frequency of Communication with Friends and Family:   . Frequency of Social Gatherings with Friends and Family:   . Attends Religious Services:   . Active Member of Clubs or Organizations:   . Attends Archivist Meetings:   Marland Kitchen Marital Status:   Intimate Partner Violence:   . Fear of Current or Ex-Partner:   . Emotionally Abused:   Marland Kitchen Physically Abused:   . Sexually Abused:    Family History  Problem Relation Age of Onset  . Arthritis Mother   . Lupus Mother   . Autoimmune disease Mother   . Crohn's disease Mother   . Cancer Maternal Grandmother        breast  . Heart disease Maternal Grandfather   . Heart disease Paternal Grandfather   . Hypertension Paternal Grandfather   . Lupus Maternal Aunt    No current facility-administered medications on file prior to encounter.    Current Outpatient Medications on File Prior to Encounter  Medication Sig Dispense Refill  . Prenatal Vit-Fe Fumarate-FA (MULTIVITAMIN-PRENATAL) 27-0.8 MG TABS tablet Take 1 tablet by mouth daily at 12 noon.     Allergies  Allergen Reactions  . Shellfish Allergy Anaphylaxis and Swelling    Only Crawfish not allergic to any other shellfish     I have reviewed patient's Past Medical Hx, Surgical Hx, Family Hx, Social Hx, medications and allergies.   Review of Systems  Constitutional: Negative.   Gastrointestinal: Positive for nausea and vomiting. Negative for abdominal pain, constipation and diarrhea.  Genitourinary: Negative for dysuria, flank pain, vaginal bleeding and vaginal discharge.  Musculoskeletal: Positive for back pain.    OBJECTIVE Patient Vitals for the past 24 hrs:  BP Temp Temp src Pulse Resp SpO2 Height Weight  02/08/20 1425 131/79 - - (!) 104 - - - -  02/08/20 1235 130/80 98.4 F (36.9 C) Oral 97 17 98 % - -  02/08/20 1231 - - - - - - 4\' 11"  (1.499 m) 99.1 kg   Constitutional: Well-developed, well-nourished female in no acute distress.  Cardiovascular: normal rate & rhythm, no murmur Respiratory: normal rate and effort. Lung sounds clear throughout GI: No CVA tenderness.  Abd soft, non-tender, Pos BS x 4. No guarding or rebound tenderness MS: Extremities nontender, no edema, normal ROM Neurologic: Alert and oriented x 4.  GU:  NEFG. Cervix closed.   LAB RESULTS Results for orders placed or performed during the hospital encounter of 02/08/20 (from the past 24 hour(s))  Urinalysis, Routine w reflex microscopic     Status: Abnormal   Collection Time: 02/08/20 12:52 PM  Result Value Ref Range   Color, Urine YELLOW YELLOW   APPearance CLOUDY (A) CLEAR   Specific Gravity, Urine 1.018 1.005 - 1.030   pH 8.0 5.0 - 8.0   Glucose, UA NEGATIVE NEGATIVE mg/dL   Hgb urine dipstick NEGATIVE NEGATIVE   Bilirubin Urine NEGATIVE NEGATIVE   Ketones, ur NEGATIVE  NEGATIVE mg/dL   Protein, ur 30 (A) NEGATIVE mg/dL   Nitrite POSITIVE (A) NEGATIVE   Leukocytes,Ua MODERATE (A) NEGATIVE   RBC / HPF 0-5 0 - 5 RBC/hpf   WBC, UA 21-50 0 - 5 WBC/hpf   Bacteria, UA MANY (A) NONE SEEN   Squamous Epithelial / LPF 0-5 0 - 5   Mucus PRESENT    Amorphous Crystal PRESENT   Wet prep, genital     Status: Abnormal   Collection Time: 02/08/20  1:36 PM   Specimen: Cervix  Result Value Ref Range   Yeast Wet Prep HPF POC NONE SEEN NONE SEEN   Trich, Wet Prep NONE SEEN NONE SEEN   Clue Cells Wet Prep HPF POC NONE SEEN NONE SEEN   WBC, Wet Prep HPF POC FEW (A) NONE SEEN   Sperm NONE SEEN     IMAGING No results found.  MAU COURSE Orders Placed This Encounter  Procedures  . Culture, OB Urine  . Wet prep, genital  . Urinalysis, Routine w reflex microscopic  . Discharge patient   Meds ordered this encounter  Medications  . cephALEXin (KEFLEX) 500 MG capsule    Sig: Take 1 capsule (500 mg total) by mouth 4 (four) times daily.    Dispense:  28 capsule    Refill:  0    Order Specific Question:   Supervising Provider    AnswerCatalina Pizza [[id number]]  . Doxylamine-Pyridoxine 10-10 MG TBEC    Sig: Start with 2 tablets every evening. If symptoms persist, add 1 tablet every morning. If symptoms persist, add 1 tablet at mid-day.    Dispense:  60 tablet    Refill:  1    Order Specific Question:   Supervising Provider    Answer:   Catalina Pizza [[id number]]    MDM Had ultrasound in office that confirmed IUP. BSUS today for viability.   Pt informed that the ultrasound is considered a limited OB ultrasound and is not intended to be a complete ultrasound exam.  Patient also informed that the ultrasound is not being completed with the intent of assessing for fetal or placental anomalies or any pelvic abnormalities.  Explained that the purpose of today's ultrasound is to assess for  viability.  Patient acknowledges the purpose of the exam and the limitations of the  study.  Active fetus with FHR 171 bpm  U/a with nitrites. No CVAT & patient afebrile. Will treat with antibiotics & send urine for culture.   Cervix closed  Will rx diclegis for nausea per patient request.    ASSESSMENT 1. Urinary tract infection in mother during first trimester of pregnancy   2. [redacted] weeks gestation of pregnancy   3. Nausea and vomiting during pregnancy prior to [redacted] weeks gestation     PLAN Discharge home in stable condition. Pyelo & SAB precautions GC/CT & urine culture pending Rx keflex Rx diclegis  Allergies as of 02/08/2020      Reactions   Shellfish Allergy Anaphylaxis, Swelling   Only Crawfish not allergic to any other shellfish       Medication List    TAKE these medications   cephALEXin 500 MG capsule Commonly known as: KEFLEX Take 1 capsule (500 mg total) by mouth 4 (four) times daily.   Doxylamine-Pyridoxine 10-10 MG Tbec Start with 2 tablets every evening. If symptoms persist, add 1 tablet every morning. If symptoms persist, add 1 tablet at mid-day.   multivitamin-prenatal 27-0.8 MG Tabs tablet Take 1 tablet by mouth daily at 12 noon.        Judeth Horn, NP 02/08/2020  3:00 PM

## 2020-02-08 NOTE — Discharge Instructions (Signed)
Nausea medication to take during pregnancy (if you don't get the prescription diclegis):  Unisom (doxylamine succinate 25 mg tablets) Take one tablet daily at bedtime. If symptoms are not adequately controlled, the dose can be increased to a maximum recommended dose of two tablets daily (1/2 tablet in the morning, 1/2 tablet mid-afternoon and one at bedtime). Vitamin B6 100mg  tablets. Take one tablet twice a day (up to 200 mg per day).     Pregnancy and Urinary Tract Infection  A urinary tract infection (UTI) is an infection of any part of the urinary tract. This includes the kidneys, the tubes that connect your kidneys to your bladder (ureters), the bladder, and the tube that carries urine out of your body (urethra). These organs make, store, and get rid of urine in the body. Your health care provider may use other names to describe the infection. An upper UTI affects the ureters and kidneys (pyelonephritis). A lower UTI affects the bladder (cystitis) and urethra (urethritis). Most urinary tract infections are caused by bacteria in your genital area, around the entrance to your urinary tract (urethra). These bacteria grow and cause irritation and inflammation of your urinary tract. You are more likely to develop a UTI during pregnancy because the physical and hormonal changes your body goes through can make it easier for bacteria to get into your urinary tract. Your growing baby also puts pressure on your bladder and can affect urine flow. It is important to recognize and treat UTIs in pregnancy because of the risk of serious complications for both you and your baby. How does this affect me? Symptoms of a UTI include:  Needing to urinate right away (urgently).  Frequent urination or passing small amounts of urine frequently.  Pain or burning with urination.  Blood in the urine.  Urine that smells bad or unusual.  Trouble urinating.  Cloudy urine.  Pain in the abdomen or lower  back.  Vaginal discharge. You may also have:  Vomiting or a decreased appetite.  Confusion.  Irritability or tiredness.  A fever.  Diarrhea. How does this affect my baby? An untreated UTI during pregnancy could lead to a kidney infection or a systemic infection, which can cause health problems that could affect your baby. Possible complications of an untreated UTI include:  Giving birth to your baby before 37 weeks of pregnancy (premature).  Having a baby with a low birth weight.  Developing high blood pressure during pregnancy (preeclampsia).  Having a low hemoglobin level (anemia). What can I do to lower my risk? To prevent a UTI:  Go to the bathroom as soon as you feel the need. Do not hold urine for long periods of time.  Always wipe from front to back, especially after a bowel movement. Use each tissue one time when you wipe.  Empty your bladder after sex.  Keep your genital area dry.  Drink 6-10 glasses of water each day.  Do not douche or use deodorant sprays. How is this treated? Treatment for this condition may include:  Antibiotic medicines that are safe to take during pregnancy.  Other medicines to treat less common causes of UTI. Follow these instructions at home:  If you were prescribed an antibiotic medicine, take it as told by your health care provider. Do not stop using the antibiotic even if you start to feel better.  Keep all follow-up visits as told by your health care provider. This is important. Contact a health care provider if:  Your symptoms do not  improve or they get worse.  You have abnormal vaginal discharge. Get help right away if you:  Have a fever.  Have nausea and vomiting.  Have back or side pain.  Feel contractions in your uterus.  Have lower belly pain.  Have a gush of fluid from your vagina.  Have blood in your urine. Summary  A urinary tract infection (UTI) is an infection of any part of the urinary tract,  which includes the kidneys, ureters, bladder, and urethra.  Most urinary tract infections are caused by bacteria in your genital area, around the entrance to your urinary tract (urethra).  You are more likely to develop a UTI during pregnancy.  If you were prescribed an antibiotic medicine, take it as told by your health care provider. Do not stop using the antibiotic even if you start to feel better. This information is not intended to replace advice given to you by your health care provider. Make sure you discuss any questions you have with your health care provider. Document Revised: 03/08/2019 Document Reviewed: 10/18/2018 Elsevier Patient Education  [place].

## 2020-02-10 LAB — GC/CHLAMYDIA PROBE AMP (~~LOC~~) NOT AT ARMC
Chlamydia: NEGATIVE
Comment: NEGATIVE
Comment: NORMAL
Neisseria Gonorrhea: NEGATIVE

## 2020-02-10 LAB — CULTURE, OB URINE: Culture: >=100000 — AB

## 2020-02-14 DIAGNOSIS — F341 Dysthymic disorder: Secondary | ICD-10-CM | POA: Diagnosis not present

## 2020-02-21 DIAGNOSIS — F341 Dysthymic disorder: Secondary | ICD-10-CM | POA: Diagnosis not present

## 2020-02-24 ENCOUNTER — Other Ambulatory Visit: Payer: Self-pay | Admitting: Obstetrics and Gynecology

## 2020-02-24 DIAGNOSIS — Z3682 Encounter for antenatal screening for nuchal translucency: Secondary | ICD-10-CM

## 2020-02-25 ENCOUNTER — Ambulatory Visit (INDEPENDENT_AMBULATORY_CARE_PROVIDER_SITE_OTHER): Payer: Medicaid Other | Admitting: Women's Health

## 2020-02-25 ENCOUNTER — Ambulatory Visit: Payer: Medicaid Other | Admitting: *Deleted

## 2020-02-25 ENCOUNTER — Ambulatory Visit (INDEPENDENT_AMBULATORY_CARE_PROVIDER_SITE_OTHER): Payer: Medicaid Other

## 2020-02-25 ENCOUNTER — Other Ambulatory Visit: Payer: Self-pay

## 2020-02-25 ENCOUNTER — Encounter: Payer: Self-pay | Admitting: Women's Health

## 2020-02-25 VITALS — BP 136/85 | HR 102 | Wt 217.0 lb

## 2020-02-25 DIAGNOSIS — Z3682 Encounter for antenatal screening for nuchal translucency: Secondary | ICD-10-CM | POA: Diagnosis not present

## 2020-02-25 DIAGNOSIS — Z8742 Personal history of other diseases of the female genital tract: Secondary | ICD-10-CM

## 2020-02-25 DIAGNOSIS — Z3A12 12 weeks gestation of pregnancy: Secondary | ICD-10-CM | POA: Diagnosis not present

## 2020-02-25 DIAGNOSIS — Z3481 Encounter for supervision of other normal pregnancy, first trimester: Secondary | ICD-10-CM

## 2020-02-25 DIAGNOSIS — Z3480 Encounter for supervision of other normal pregnancy, unspecified trimester: Secondary | ICD-10-CM | POA: Diagnosis not present

## 2020-02-25 LAB — POCT URINALYSIS DIPSTICK OB
Blood, UA: NEGATIVE
Glucose, UA: NEGATIVE
Ketones, UA: NEGATIVE
Leukocytes, UA: NEGATIVE
Nitrite, UA: NEGATIVE
POC,PROTEIN,UA: NEGATIVE

## 2020-02-25 MED ORDER — BLOOD PRESSURE MONITOR MISC: 0 refills | Status: DC

## 2020-02-25 NOTE — Progress Notes (Signed)
[place] 12+3 wks,measurements c/w dates,crl 61.15 mm,fhr 160 bpm,anterior placenta,subchorionic hemorrhage 3 x 1.6 x 1.8 cm,normal ovaries,NB present,NT 1.6 mm

## 2020-02-25 NOTE — Progress Notes (Signed)
INITIAL OBSTETRICAL VISIT Patient name: Tara Mcmahon MRN [id number]  Date of birth: 11-22-1996 Chief Complaint:   Initial Prenatal Visit  History of Present Illness:   Tara Mcmahon is a 24 y.o. [phone] Caucasian female at [redacted]w[redacted]d by 6wk u/s, with an Estimated Date of Delivery: 09/06/20 being seen today for her initial obstetrical visit.   Her obstetrical history is significant for SAB x1, 26wk PTB: received rescue cerclage at 21.5wks d/t cervical incompetence/bbow, 2d later pprom, began bleeding and laboring @ 26wks and delivered baby w/in 30mins, suspected abruption, path of placenta also revealed [place] (in [place]), then term SVB x 2 (cerclage w/ both) both w/ GHTN/pre-e Today she reports no complaints.  Depression screen [facility] 2/9 02/25/2020 12/13/2017 06/23/2017  Decreased Interest 0 1 0  Down, Depressed, Hopeless 0 1 0  PHQ - 2 Score 0 2 0  Altered sleeping - 0 -  Tired, decreased energy 1 1 -  Change in appetite 0 0 -  Feeling bad or failure about yourself  0 0 -  Trouble concentrating 0 0 -  Moving slowly or fidgety/restless 0 1 -  Suicidal thoughts 0 0 -  PHQ-9 Score - 4 -  Difficult doing work/chores Not difficult at all Not difficult at all -    Patient's last menstrual period was 11/12/2019 (exact date). Last pap 01/27/16: needs, wants to wait til after 1st trimester. Results were: normal Review of Systems:   Pertinent items are noted in HPI Denies cramping/contractions, leakage of fluid, vaginal bleeding, abnormal vaginal discharge w/ itching/odor/irritation, headaches, visual changes, shortness of breath, chest pain, abdominal pain, severe nausea/vomiting, or problems with urination or bowel movements unless otherwise stated above.  Pertinent History Reviewed:  Reviewed past medical,surgical, social, obstetrical and family history.  Reviewed problem list, medications and allergies. OB History  Gravida Para Term Preterm AB Living  5 3 2 1 1 3   SAB TAB Ectopic  Multiple Live Births  1     0 3    # Outcome Date GA Lbr Len/2nd Weight Sex Delivery Anes PTL Lv  5 Current           4 Term 07/19/18 [redacted]w[redacted]d 16:23 / 04:43 8 lb (3.63 kg) F Vag-Spont EPI  LIV     Birth Comments: WNL     Complications: Pre-eclampsia  3 Term 08/30/16 [redacted]w[redacted]d 07:05 / 03:13 5 lb 15.1 oz (2.695 kg) F Vag-Spont EPI Y LIV     Complications: History of cervical cerclage, Gestational hypertension  2 Preterm 09/19/12 [redacted]w[redacted]d  1 lb 13 oz (0.822 kg) F Vag-Spont None Y LIV     Complications: Cervical incompetence, History of preterm premature rupture of membranes (PPROM)  1 SAB 2013           Physical Assessment:   Vitals:   02/25/20 1600  BP: 136/85  Pulse: (!) 102  Weight: 217 lb (98.4 kg)  Body mass index is 43.83 kg/m.       Physical Examination:  General appearance - well appearing, and in no distress  Mental status - alert, oriented to person, place, and time  Psych:  She has a normal mood and affect  Skin - warm and dry, normal color, no suspicious lesions noted  Chest - effort normal, all lung fields clear to auscultation bilaterally  Heart - normal rate and regular rhythm  Abdomen - soft, nontender  Extremities:  No swelling or varicosities noted  Thin prep pap is not done   TODAY'S NT [place]  12+3 wks,measurements c/w dates,crl 61.15 mm,fhr 160 bpm,anterior placenta,subchorionic hemorrhage 3 x 1.6 x 1.8 cm,normal ovaries,NB present,NT 1.6 mm  Results for orders placed or performed in visit on 02/25/20 (from the past 24 hour(s))  POC Urinalysis Dipstick OB   Collection Time: 02/25/20  4:15 PM  Result Value Ref Range   Color, UA     Clarity, UA     Glucose, UA Negative Negative   Bilirubin, UA     Ketones, UA neg    Spec Grav, UA     Blood, UA neg    pH, UA     POC,PROTEIN,UA Negative Negative, Trace, Small (1+), Moderate (2+), Large (3+), 4+   Urobilinogen, UA     Nitrite, UA neg    Leukocytes, UA Negative Negative   Appearance     Odor      Assessment &  Plan:  1) Low-Risk Pregnancy [id number] at [redacted]w[redacted]d with an Estimated Date of Delivery: 09/06/20   2) Initial OB visit  3) H/O cervical incompetence w/ 26wk PTB> then term SVB x 2 w/ cerclages placed early 2nd trimester. LHE in to discuss, will schedule her for cerclage @ 14wks  4) H/O GHTN/pre-e> start ASA, baseline labs today  Meds:  Meds ordered this encounter  Medications  . Blood Pressure Monitor MISC    Sig: For regular home bp monitoring during pregnancy    Dispense:  1 each    Refill:  0    Z34.80 Needs large cuff    Initial labs obtained Continue prenatal vitamins Reviewed n/v relief measures and warning s/s to report Reviewed recommended weight gain based on pre-gravid BMI Encouraged well-balanced diet Genetic Screening discussed: requested nt/it, maternit21 Cystic fibrosis, SMA, Fragile X screening discussed declined Ultrasound discussed; fetal survey: requested CCNC completed>PCM not here, form faxed The nature of [facility] [employer] with multiple MDs and other Advanced Practice Providers was explained to patient; also emphasized that fellows, residents, and students are part of our team.  Follow-up: 3wks LROB in person w/ MD/CNM  Orders Placed This Encounter  Procedures  . Urine Culture  . Integrated 1  . Obstetric Panel, Including HIV  . Hepatitis C antibody  . Hgb Fractionation Cascade  . Comprehensive metabolic panel  . Protein / creatinine ratio, urine  . Hemoglobin A1c  . MaterniT 21 plus Core, Blood  . Pain Management Screening Profile (10S)  . POC Urinalysis Dipstick OB    Cheral Marker CNM, [facility] 02/25/2020 4:28 PM

## 2020-02-25 NOTE — Patient Instructions (Signed)
Tara Mcmahon, I greatly value your feedback.  If you receive a survey following your visit with [place] today, we appreciate you taking the time to fill it out.  Thanks, Knute Neu, CNM, WHNP-BC   [facility] ([place], [place] [address]) Entrance C, located off of [address] parking   Begin taking 162mg  (two 81mg  tablets) baby aspirin daily to decrease the risk of preeclampsia during pregnancy    Nausea & Vomiting  Have saltine crackers or pretzels by your bed and eat a few bites before you raise your head out of bed in the morning  Eat small frequent meals throughout the day instead of large meals  Drink plenty of fluids throughout the day to stay hydrated, just don't drink a lot of fluids with your meals.  This can make your stomach fill up faster making you feel sick  Do not brush your teeth right after you eat  Products with real ginger are good for nausea, like ginger ale and ginger hard candy Make sure it says made with real ginger!  Sucking on sour candy like lemon heads is also good for nausea  If your prenatal vitamins make you nauseated, take them at night so you will sleep through the nausea  Sea Bands  If you feel like you need medicine for the nausea & vomiting please let us know  If you are unable to keep any fluids or food down please let us know   Constipation  Drink plenty of fluid, preferably water, throughout the day  Eat foods high in fiber such as fruits, vegetables, and grains  Exercise, such as walking, is a good way to keep your bowels regular  Drink warm fluids, especially warm prune juice, or decaf coffee  Eat a 1/2 cup of real oatmeal (not instant), 1/2 cup applesauce, and 1/2-1 cup warm prune juice every day  If needed, you may take Colace (docusate sodium) stool softener once or twice a day to help keep the stool soft.   If you still are having problems with constipation, you may  take Miralax once daily as needed to help keep your bowels regular.   Home Blood Pressure Monitoring for Patients   Your provider has recommended that you check your blood pressure (BP) at least once a week at home. If you do not have a blood pressure cuff at home, one will be provided for you. Contact your provider if you have not received your monitor within 1 week.   Helpful Tips for Accurate Home Blood Pressure Checks  . Don't smoke, exercise, or drink caffeine 30 minutes before checking your BP . Use the restroom before checking your BP (a full bladder can raise your pressure) . Relax in a comfortable upright chair . Feet on the ground . Left arm resting comfortably on a flat surface at the level of your heart . Legs uncrossed . Back supported . Sit quietly and don't talk . Place the cuff on your bare arm . Adjust snuggly, so that only two fingertips can fit between your skin and the top of the cuff . Check 2 readings separated by at least one minute . Keep a log of your BP readings . For a visual, please reference this diagram: http://ccnc.care/bpdiagram  Provider Name: [facility]     Phone: [phone]  Zone 1: ALL CLEAR  Continue to monitor your symptoms:  . BP reading is less than  140 (top number) or less than 90 (bottom number)  . No right upper stomach pain . No headaches or seeing spots . No feeling nauseated or throwing up . No swelling in face and hands  Zone 2: CAUTION Call your doctor's office for any of the following:  . BP reading is greater than 140 (top number) or greater than 90 (bottom number)  . Stomach pain under your ribs in the middle or right side . Headaches or seeing spots . Feeling nauseated or throwing up . Swelling in face and hands  Zone 3: EMERGENCY  Seek immediate medical care if you have any of the following:  . BP reading is greater than160 (top number) or greater than 110 (bottom number) . Severe headaches not improving with  Tylenol . Serious difficulty catching your breath . Any worsening symptoms from Zone 2    First Trimester of Pregnancy The first trimester of pregnancy is from week 1 until the end of week 12 (months 1 through 3). A week after a sperm fertilizes an egg, the egg will implant on the wall of the uterus. This embryo will begin to develop into a baby. Genes from you and your partner are forming the baby. The female genes determine whether the baby is a boy or a girl. At 6-8 weeks, the eyes and face are formed, and the heartbeat can be seen on ultrasound. At the end of 12 weeks, all the baby's organs are formed.  Now that you are pregnant, you will want to do everything you can to have a healthy baby. Two of the most important things are to get good prenatal care and to follow your health care provider's instructions. Prenatal care is all the medical care you receive before the baby's birth. This care will help prevent, find, and treat any problems during the pregnancy and childbirth. BODY CHANGES Your body goes through many changes during pregnancy. The changes vary from woman to woman.   You may gain or lose a couple of pounds at first.  You may feel sick to your stomach (nauseous) and throw up (vomit). If the vomiting is uncontrollable, call your health care provider.  You may tire easily.  You may develop headaches that can be relieved by medicines approved by your health care provider.  You may urinate more often. Painful urination may mean you have a bladder infection.  You may develop heartburn as a result of your pregnancy.  You may develop constipation because certain hormones are causing the muscles that push waste through your intestines to slow down.  You may develop hemorrhoids or swollen, bulging veins (varicose veins).  Your breasts may begin to grow larger and become tender. Your nipples may stick out more, and the tissue that surrounds them (areola) may become darker.  Your gums  may bleed and may be sensitive to brushing and flossing.  Dark spots or blotches (chloasma, mask of pregnancy) may develop on your face. This will likely fade after the baby is born.  Your menstrual periods will stop.  You may have a loss of appetite.  You may develop cravings for certain kinds of food.  You may have changes in your emotions from day to day, such as being excited to be pregnant or being concerned that something may go wrong with the pregnancy and baby.  You may have more vivid and strange dreams.  You may have changes in your hair. These can include thickening of your hair, rapid growth, and changes  in texture. Some women also have hair loss during or after pregnancy, or hair that feels dry or thin. Your hair will most likely return to normal after your baby is born. WHAT TO EXPECT AT YOUR PRENATAL VISITS During a routine prenatal visit:  You will be weighed to make sure you and the baby are growing normally.  Your blood pressure will be taken.  Your abdomen will be measured to track your baby's growth.  The fetal heartbeat will be listened to starting around week 10 or 12 of your pregnancy.  Test results from any previous visits will be discussed. Your health care provider may ask you:  How you are feeling.  If you are feeling the baby move.  If you have had any abnormal symptoms, such as leaking fluid, bleeding, severe headaches, or abdominal cramping.  If you have any questions. Other tests that may be performed during your first trimester include:  Blood tests to find your blood type and to check for the presence of any previous infections. They will also be used to check for low iron levels (anemia) and Rh antibodies. Later in the pregnancy, blood tests for diabetes will be done along with other tests if problems develop.  Urine tests to check for infections, diabetes, or protein in the urine.  An ultrasound to confirm the proper growth and development  of the baby.  An amniocentesis to check for possible genetic problems.  Fetal screens for spina bifida and Down syndrome.  You may need other tests to make sure you and the baby are doing well. HOME CARE INSTRUCTIONS  Medicines  Follow your health care provider's instructions regarding medicine use. Specific medicines may be either safe or unsafe to take during pregnancy.  Take your prenatal vitamins as directed.  If you develop constipation, try taking a stool softener if your health care provider approves. Diet  Eat regular, well-balanced meals. Choose a variety of foods, such as meat or vegetable-based protein, fish, milk and low-fat dairy products, vegetables, fruits, and whole grain breads and cereals. Your health care provider will help you determine the amount of weight gain that is right for you.  Avoid raw meat and uncooked cheese. These carry germs that can cause birth defects in the baby.  Eating four or five small meals rather than three large meals a day may help relieve nausea and vomiting. If you start to feel nauseous, eating a few soda crackers can be helpful. Drinking liquids between meals instead of during meals also seems to help nausea and vomiting.  If you develop constipation, eat more high-fiber foods, such as fresh vegetables or fruit and whole grains. Drink enough fluids to keep your urine clear or pale yellow. Activity and Exercise  Exercise only as directed by your health care provider. Exercising will help you:  Control your weight.  Stay in shape.  Be prepared for labor and delivery.  Experiencing pain or cramping in the lower abdomen or low back is a good sign that you should stop exercising. Check with your health care provider before continuing normal exercises.  Try to avoid standing for long periods of time. Move your legs often if you must stand in one place for a long time.  Avoid heavy lifting.  Wear low-heeled shoes, and practice good  posture.  You may continue to have sex unless your health care provider directs you otherwise. Relief of Pain or Discomfort  Wear a good support bra for breast tenderness.    Take  warm sitz baths to soothe any pain or discomfort caused by hemorrhoids. Use hemorrhoid cream if your health care provider approves.    Rest with your legs elevated if you have leg cramps or low back pain.  If you develop varicose veins in your legs, wear support hose. Elevate your feet for 15 minutes, 3-4 times a day. Limit salt in your diet. Prenatal Care  Schedule your prenatal visits by the twelfth week of pregnancy. They are usually scheduled monthly at first, then more often in the last 2 months before delivery.  Write down your questions. Take them to your prenatal visits.  Keep all your prenatal visits as directed by your health care provider. Safety  Wear your seat belt at all times when driving.  Make a list of emergency phone numbers, including numbers for family, friends, the hospital, and police and fire departments. General Tips  Ask your health care provider for a referral to a local prenatal education class. Begin classes no later than at the beginning of month 6 of your pregnancy.  Ask for help if you have counseling or nutritional needs during pregnancy. Your health care provider can offer advice or refer you to specialists for help with various needs.  Do not use hot tubs, steam rooms, or saunas.  Do not douche or use tampons or scented sanitary pads.  Do not cross your legs for long periods of time.  Avoid cat litter boxes and soil used by cats. These carry germs that can cause birth defects in the baby and possibly loss of the fetus by miscarriage or stillbirth.  Avoid all smoking, herbs, alcohol, and medicines not prescribed by your health care provider. Chemicals in these affect the formation and growth of the baby.  Schedule a dentist appointment. At home, brush your teeth with  a soft toothbrush and be gentle when you floss. SEEK MEDICAL CARE IF:   You have dizziness.  You have mild pelvic cramps, pelvic pressure, or nagging pain in the abdominal area.  You have persistent nausea, vomiting, or diarrhea.  You have a bad smelling vaginal discharge.  You have pain with urination.  You notice increased swelling in your face, hands, legs, or ankles. SEEK IMMEDIATE MEDICAL CARE IF:   You have a fever.  You are leaking fluid from your vagina.  You have spotting or bleeding from your vagina.  You have severe abdominal cramping or pain.  You have rapid weight gain or loss.  You vomit blood or material that looks like coffee grounds.  You are exposed to [place] measles and have never had them.  You are exposed to fifth disease or chickenpox.  You develop a severe headache.  You have shortness of breath.  You have any kind of trauma, such as from a fall or a car accident. Document Released: 11/08/2001 Document Revised: 03/31/2014 Document Reviewed: 09/24/2013 [facility] Patient Information 2015 [place], [place]. This information is not intended to replace advice given to you by your health care provider. Make sure you discuss any questions you have with your health care provider.

## 2020-02-26 ENCOUNTER — Encounter: Payer: Self-pay | Admitting: Women's Health

## 2020-02-26 DIAGNOSIS — Z8759 Personal history of other complications of pregnancy, childbirth and the puerperium: Secondary | ICD-10-CM | POA: Insufficient documentation

## 2020-02-26 LAB — PMP SCREEN PROFILE (10S), URINE
Amphetamine Scrn, Ur: NEGATIVE ng/mL
BARBITURATE SCREEN URINE: NEGATIVE ng/mL
BENZODIAZEPINE SCREEN, URINE: NEGATIVE ng/mL
CANNABINOIDS UR QL SCN: NEGATIVE ng/mL
Cocaine (Metab) Scrn, Ur: NEGATIVE ng/mL
Creatinine(Crt), U: 204.9 mg/dL (ref 20.0–300.0)
Methadone Screen, Urine: NEGATIVE ng/mL
OXYCODONE+OXYMORPHONE UR QL SCN: NEGATIVE ng/mL
Opiate Scrn, Ur: NEGATIVE ng/mL
Ph of Urine: 6.9 (ref 4.5–8.9)
Phencyclidine Qn, Ur: NEGATIVE ng/mL
Propoxyphene Scrn, Ur: NEGATIVE ng/mL

## 2020-02-27 DIAGNOSIS — F341 Dysthymic disorder: Secondary | ICD-10-CM | POA: Diagnosis not present

## 2020-03-01 LAB — URINE CULTURE

## 2020-03-02 DIAGNOSIS — Z3480 Encounter for supervision of other normal pregnancy, unspecified trimester: Secondary | ICD-10-CM | POA: Diagnosis not present

## 2020-03-02 LAB — OBSTETRIC PANEL, INCLUDING HIV
Antibody Screen: NEGATIVE
Basophils Absolute: 0 10*3/uL (ref 0.0–0.2)
Basos: 0 %
EOS (ABSOLUTE): 0.1 10*3/uL (ref 0.0–0.4)
Eos: 1 %
HIV Screen 4th Generation wRfx: NONREACTIVE
Hematocrit: 38.7 % (ref 34.0–46.6)
Hemoglobin: 12.9 g/dL (ref 11.1–15.9)
Hepatitis B Surface Ag: NEGATIVE
Immature Grans (Abs): 0 10*3/uL (ref 0.0–0.1)
Immature Granulocytes: 0 %
Lymphocytes Absolute: 1.6 10*3/uL (ref 0.7–3.1)
Lymphs: 16 %
MCH: 28.9 pg (ref 26.6–33.0)
MCHC: 33.3 g/dL (ref 31.5–35.7)
MCV: 87 fL (ref 79–97)
Monocytes Absolute: 0.8 10*3/uL (ref 0.1–0.9)
Monocytes: 8 %
Neutrophils Absolute: 7.5 10*3/uL — ABNORMAL HIGH (ref 1.4–7.0)
Neutrophils: 75 %
Platelets: 379 10*3/uL (ref 150–450)
RBC: 4.47 x10E6/uL (ref 3.77–5.28)
RDW: 13.7 % (ref 11.7–15.4)
RPR Ser Ql: NONREACTIVE
Rh Factor: POSITIVE
Rubella Antibodies, IGG: 3.22 index (ref >0.99)
WBC: 10 10*3/uL (ref 3.4–10.8)

## 2020-03-02 LAB — PROTEIN / CREATININE RATIO, URINE
Creatinine, Urine: 198.5 mg/dL
Protein, Ur: 14.7 mg/dL
Protein/Creat Ratio: 74 mg/g creat (ref 0–200)

## 2020-03-02 LAB — COMPREHENSIVE METABOLIC PANEL
ALT: 14 IU/L (ref 0–32)
AST: 15 IU/L (ref 0–40)
Albumin/Globulin Ratio: 1.4 (ref 1.2–2.2)
Albumin: 4.2 g/dL (ref 3.9–5.0)
Alkaline Phosphatase: 69 IU/L (ref 39–117)
BUN/Creatinine Ratio: 10 (ref 9–23)
BUN: 7 mg/dL (ref 6–20)
Bilirubin Total: 0.2 mg/dL (ref 0.0–1.2)
CO2: 22 mmol/L (ref 20–29)
Calcium: 9.7 mg/dL (ref 8.7–10.2)
Chloride: 104 mmol/L (ref 96–106)
Creatinine, Ser: 0.69 mg/dL (ref 0.57–1.00)
GFR calc Af Amer: 140 mL/min/{1.73_m2} (ref >59)
GFR calc non Af Amer: 121 mL/min/{1.73_m2} (ref >59)
Globulin, Total: 2.9 g/dL (ref 1.5–4.5)
Glucose: 86 mg/dL (ref 65–99)
Potassium: 4.3 mmol/L (ref 3.5–5.2)
Sodium: 141 mmol/L (ref 134–144)
Total Protein: 7.1 g/dL (ref 6.0–8.5)

## 2020-03-02 LAB — MATERNIT 21 PLUS CORE, BLOOD
Fetal Fraction: 9
Result (T21): NEGATIVE
Trisomy 13 (Patau syndrome): NEGATIVE
Trisomy 18 (Edwards syndrome): NEGATIVE
Trisomy 21 (Down syndrome): NEGATIVE

## 2020-03-02 LAB — INTEGRATED 1
Crown Rump Length: 61.2 mm
Gest. Age on Collection Date: 12.4 weeks
Maternal Age at EDD: 26.2 yr
Nuchal Translucency (NT): 1.6 mm
Number of Fetuses: 1
PAPP-A Value: 515.3 ng/mL
Weight: 217 [lb_av]

## 2020-03-02 LAB — HGB FRACTIONATION CASCADE
Hgb A2: 2.9 % (ref 1.8–3.2)
Hgb A: 97.1 % (ref 96.4–98.8)
Hgb F: 0 % (ref 0.0–2.0)
Hgb S: 0 %

## 2020-03-02 LAB — HEMOGLOBIN A1C
Est. average glucose Bld gHb Est-mCnc: 97 mg/dL
Hgb A1c MFr Bld: 5 % (ref 4.8–5.6)

## 2020-03-02 LAB — HEPATITIS C ANTIBODY: Hep C Virus Ab: <0.1 s/co ratio (ref 0.0–0.9)

## 2020-03-10 DIAGNOSIS — F341 Dysthymic disorder: Secondary | ICD-10-CM | POA: Diagnosis not present

## 2020-03-10 NOTE — Patient Instructions (Signed)
Tara Mcmahon  03/10/2020     @PREFPERIOPPHARMACY @   Your procedure is scheduled on  03/13/2020 .  Report to [facility] at  1145  A.M.  Call this number if you have problems the morning of surgery:  [phone]   Remember:  Do not eat or drink after midnight.                        Take these medicines the morning of surgery with A SIP OF WATER None    Do not wear jewelry, make-up or nail polish.  Do not wear lotions, powders, or perfumes. Please wear deodorant and brush your teeth.  Do not shave 48 hours prior to surgery.  Men may shave face and neck.  Do not bring valuables to the hospital.  [facility] is not responsible for any belongings or valuables.  Contacts, dentures or bridgework may not be worn into surgery.  Leave your suitcase in the car.  After surgery it may be brought to your room.  For patients admitted to the hospital, discharge time will be determined by your treatment team.  Patients discharged the day of surgery will not be allowed to drive home.   Name and phone number of your driver:   family Special instructions:  DO NOT smoke the morning of your surgery.  Please read over the following fact sheets that you were given. Anesthesia Post-op Instructions and Care and Recovery After Surgery       Cervical Cerclage, Care After This sheet gives you information about how to care for yourself after your procedure. Your health care provider may also give you more specific instructions. If you have problems or questions, contact your health care provider. What can I expect after the procedure? After your procedure, it is common to have:  Cramping in your abdomen.  Mucus discharge from your vagina. This may last for several days.  Painful urination (dysuria).  Spotting, or small drops of blood coming from your vagina. Follow these instructions at home:  Medicines  Take over-the-counter and prescription medicines only as told by your  health care provider.  Ask your health care provider if the medicine prescribed to you requires you to avoid driving or using heavy machinery. General instructions  If you are told to go on bed rest, follow instructions from your health care provider. You may need to be on bed rest for up to 3 days.  Keep track of your vaginal discharge and watch for any changes. If you notice changes, tell your health care provider.  Avoid physical activities and exercise until your health care provider approves. Ask your health care provider what activities are safe for you.  Do not douche or have sex until your health care provider says it is okay to do so.  Keep all follow-up visits, including prenatal visits, as told by your health care provider. This is important. ? Prenatal visits are all the care that you receive before the birth of your baby. ? You may also need an ultrasound.  You may be asked to have weekly visits to have your cervix checked. Contact a health care provider if you:  Have abnormal discharge from your vagina, such as clots.  Have a bad-smelling discharge from your vagina.  Develop a rash on your skin. This may look like redness and swelling.  Become light-headed or feel like you are going to faint.  Have abdominal pain  that does not get better with medicine.  Have nausea or vomiting that does not go away. Get help right away if you:  Have vaginal bleeding that is heavier or more frequent than spotting.  Are leaking fluid or your water breaks.  Have a fever or chills.  Faint.  Have uterine contractions. These may feel like: ? A back ache. ? Lower abdominal pain. ? Mild cramps, similar to menstrual cramps. ? Tightening or pressure in your abdomen.  Think that your baby is not moving as much as usual, or you cannot feel your baby move.  Have chest pain.  Have shortness of breath. Summary  After the procedure, it is common to have cramping, vaginal discharge,  painful urination, and small drops of blood coming from your vagina.  If you are told to go on bed rest, follow instructions from your health care provider. You may need to be on bed rest for up to 3 days.  Keep track of your vaginal discharge and watch for any changes. If you notice changes, tell your health care provider.  Contact a health care provider if you have abnormal vaginal discharge, become light-headed, or have pain that cannot be controlled with medicines.  Get help right away if you have heavy vaginal bleeding, your water breaks, or you have uterine contractions. Also, get help right away if your baby is not moving as much as usual, or you have chest pain or shortness of breath. This information is not intended to replace advice given to you by your health care provider. Make sure you discuss any questions you have with your health care provider. Document Revised: 09/10/2019 Document Reviewed: 07/10/2019 Elsevier Patient Education  2020 [employer] General Anesthesia, Adult, Care After This sheet gives you information about how to care for yourself after your procedure. Your health care provider may also give you more specific instructions. If you have problems or questions, contact your health care provider. What can I expect after the procedure? After the procedure, the following side effects are common:  Pain or discomfort at the IV site.  Nausea.  Vomiting.  Sore throat.  Trouble concentrating.  Feeling cold or chills.  Weak or tired.  Sleepiness and fatigue.  Soreness and body aches. These side effects can affect parts of the body that were not involved in surgery. Follow these instructions at home:  For at least 24 hours after the procedure:  Have a responsible adult stay with you. It is important to have someone help care for you until you are awake and alert.  Rest as needed.  Do not: ? Participate in activities in which you could fall or become  injured. ? Drive. ? Use heavy machinery. ? Drink alcohol. ? Take sleeping pills or medicines that cause drowsiness. ? Make important decisions or sign legal documents. ? Take care of children on your own. Eating and drinking  Follow any instructions from your health care provider about eating or drinking restrictions.  When you feel hungry, start by eating small amounts of foods that are soft and easy to digest (bland), such as toast. Gradually return to your regular diet.  Drink enough fluid to keep your urine pale yellow.  If you vomit, rehydrate by drinking water, juice, or clear broth. General instructions  If you have sleep apnea, surgery and certain medicines can increase your risk for breathing problems. Follow instructions from your health care provider about wearing your sleep device: ? Anytime you are sleeping, including during daytime  naps. ? While taking prescription pain medicines, sleeping medicines, or medicines that make you drowsy.  Return to your normal activities as told by your health care provider. Ask your health care provider what activities are safe for you.  Take over-the-counter and prescription medicines only as told by your health care provider.  If you smoke, do not smoke without supervision.  Keep all follow-up visits as told by your health care provider. This is important. Contact a health care provider if:  You have nausea or vomiting that does not get better with medicine.  You cannot eat or drink without vomiting.  You have pain that does not get better with medicine.  You are unable to pass urine.  You develop a skin rash.  You have a fever.  You have redness around your IV site that gets worse. Get help right away if:  You have difficulty breathing.  You have chest pain.  You have blood in your urine or stool, or you vomit blood. Summary  After the procedure, it is common to have a sore throat or nausea. It is also common to  feel tired.  Have a responsible adult stay with you for the first 24 hours after general anesthesia. It is important to have someone help care for you until you are awake and alert.  When you feel hungry, start by eating small amounts of foods that are soft and easy to digest (bland), such as toast. Gradually return to your regular diet.  Drink enough fluid to keep your urine pale yellow.  Return to your normal activities as told by your health care provider. Ask your health care provider what activities are safe for you. This information is not intended to replace advice given to you by your health care provider. Make sure you discuss any questions you have with your health care provider. Document Revised: 11/17/2017 Document Reviewed: 06/30/2017 Elsevier Patient Education  2020 [employer].

## 2020-03-11 ENCOUNTER — Other Ambulatory Visit: Payer: Self-pay | Admitting: Obstetrics & Gynecology

## 2020-03-11 ENCOUNTER — Other Ambulatory Visit: Payer: Self-pay

## 2020-03-11 ENCOUNTER — Other Ambulatory Visit (HOSPITAL_COMMUNITY)
Admission: RE | Admit: 2020-03-11 | Discharge: 2020-03-11 | Disposition: A | Discharge status: Home / Self Care | Payer: Medicaid Other | Source: Ambulatory Visit | Attending: Obstetrics & Gynecology | Admitting: Obstetrics & Gynecology

## 2020-03-11 ENCOUNTER — Encounter (HOSPITAL_COMMUNITY)
Admission: RE | Admit: 2020-03-11 | Discharge: 2020-03-11 | Disposition: A | Discharge status: Home / Self Care | Payer: Medicaid Other | Source: Ambulatory Visit | Attending: Obstetrics & Gynecology | Admitting: Obstetrics & Gynecology

## 2020-03-11 ENCOUNTER — Encounter (HOSPITAL_COMMUNITY): Payer: Self-pay

## 2020-03-11 DIAGNOSIS — Z20822 Contact with and (suspected) exposure to covid-19: Secondary | ICD-10-CM | POA: Insufficient documentation

## 2020-03-11 DIAGNOSIS — Z01812 Encounter for preprocedural laboratory examination: Secondary | ICD-10-CM | POA: Diagnosis not present

## 2020-03-11 LAB — COMPREHENSIVE METABOLIC PANEL
ALT: 13 U/L (ref 0–44)
AST: 13 U/L — ABNORMAL LOW (ref 15–41)
Albumin: 3.4 g/dL — ABNORMAL LOW (ref 3.5–5.0)
Alkaline Phosphatase: 58 U/L (ref 38–126)
Anion gap: 9 (ref 5–15)
BUN: 8 mg/dL (ref 6–20)
CO2: 22 mmol/L (ref 22–32)
Calcium: 9 mg/dL (ref 8.9–10.3)
Chloride: 104 mmol/L (ref 98–111)
Creatinine, Ser: 0.57 mg/dL (ref 0.44–1.00)
GFR calc Af Amer: >60 mL/min (ref >60)
GFR calc non Af Amer: >60 mL/min (ref >60)
Glucose, Bld: 87 mg/dL (ref 70–99)
Potassium: 4.1 mmol/L (ref 3.5–5.1)
Sodium: 135 mmol/L (ref 135–145)
Total Bilirubin: 0.3 mg/dL (ref 0.3–1.2)
Total Protein: 6.8 g/dL (ref 6.5–8.1)

## 2020-03-11 LAB — URINALYSIS, ROUTINE W REFLEX MICROSCOPIC
Bilirubin Urine: NEGATIVE
Glucose, UA: NEGATIVE mg/dL
Hgb urine dipstick: NEGATIVE
Ketones, ur: NEGATIVE mg/dL
Leukocytes,Ua: NEGATIVE
Nitrite: NEGATIVE
Protein, ur: NEGATIVE mg/dL
Specific Gravity, Urine: 1.021 (ref 1.005–1.030)
pH: 5 (ref 5.0–8.0)

## 2020-03-11 LAB — CBC
HCT: 35.5 % — ABNORMAL LOW (ref 36.0–46.0)
Hemoglobin: 12.1 g/dL (ref 12.0–15.0)
MCH: 29.7 pg (ref 26.0–34.0)
MCHC: 34.1 g/dL (ref 30.0–36.0)
MCV: 87 fL (ref 80.0–100.0)
Platelets: 337 10*3/uL (ref 150–400)
RBC: 4.08 MIL/uL (ref 3.87–5.11)
RDW: 13.2 % (ref 11.5–15.5)
WBC: 9.4 10*3/uL (ref 4.0–10.5)
nRBC: 0 % (ref 0.0–0.2)

## 2020-03-12 LAB — SARS CORONAVIRUS 2 (TAT 6-24 HRS): SARS Coronavirus 2: NEGATIVE

## 2020-03-13 ENCOUNTER — Encounter (HOSPITAL_COMMUNITY)
Admission: RE | Disposition: A | Discharge status: Home / Self Care | Payer: Self-pay | Source: Home / Self Care | Attending: Obstetrics & Gynecology

## 2020-03-13 ENCOUNTER — Encounter (HOSPITAL_COMMUNITY): Payer: Self-pay | Admitting: Obstetrics & Gynecology

## 2020-03-13 ENCOUNTER — Ambulatory Visit (HOSPITAL_COMMUNITY): Payer: Medicaid Other | Admitting: Anesthesiology

## 2020-03-13 ENCOUNTER — Ambulatory Visit (HOSPITAL_COMMUNITY)
Admission: RE | Admit: 2020-03-13 | Discharge: 2020-03-13 | Disposition: A | Discharge status: Home / Self Care | Payer: Medicaid Other | Attending: Obstetrics & Gynecology | Admitting: Obstetrics & Gynecology

## 2020-03-13 ENCOUNTER — Other Ambulatory Visit: Payer: Self-pay

## 2020-03-13 DIAGNOSIS — Z8379 Family history of other diseases of the digestive system: Secondary | ICD-10-CM | POA: Diagnosis not present

## 2020-03-13 DIAGNOSIS — Z8489 Family history of other specified conditions: Secondary | ICD-10-CM | POA: Insufficient documentation

## 2020-03-13 DIAGNOSIS — Z7982 Long term (current) use of aspirin: Secondary | ICD-10-CM | POA: Diagnosis not present

## 2020-03-13 DIAGNOSIS — N883 Incompetence of cervix uteri: Secondary | ICD-10-CM | POA: Diagnosis not present

## 2020-03-13 DIAGNOSIS — Z91013 Allergy to seafood: Secondary | ICD-10-CM | POA: Diagnosis not present

## 2020-03-13 DIAGNOSIS — F329 Major depressive disorder, single episode, unspecified: Secondary | ICD-10-CM | POA: Insufficient documentation

## 2020-03-13 DIAGNOSIS — O26892 Other specified pregnancy related conditions, second trimester: Secondary | ICD-10-CM | POA: Diagnosis not present

## 2020-03-13 DIAGNOSIS — E282 Polycystic ovarian syndrome: Secondary | ICD-10-CM | POA: Diagnosis not present

## 2020-03-13 DIAGNOSIS — E039 Hypothyroidism, unspecified: Secondary | ICD-10-CM | POA: Diagnosis not present

## 2020-03-13 DIAGNOSIS — F419 Anxiety disorder, unspecified: Secondary | ICD-10-CM | POA: Insufficient documentation

## 2020-03-13 DIAGNOSIS — Z8261 Family history of arthritis: Secondary | ICD-10-CM | POA: Diagnosis not present

## 2020-03-13 DIAGNOSIS — Z8249 Family history of ischemic heart disease and other diseases of the circulatory system: Secondary | ICD-10-CM | POA: Diagnosis not present

## 2020-03-13 DIAGNOSIS — O3432 Maternal care for cervical incompetence, second trimester: Secondary | ICD-10-CM | POA: Diagnosis not present

## 2020-03-13 DIAGNOSIS — I10 Essential (primary) hypertension: Secondary | ICD-10-CM | POA: Diagnosis not present

## 2020-03-13 DIAGNOSIS — Z79899 Other long term (current) drug therapy: Secondary | ICD-10-CM | POA: Diagnosis not present

## 2020-03-13 DIAGNOSIS — Z803 Family history of malignant neoplasm of breast: Secondary | ICD-10-CM | POA: Diagnosis not present

## 2020-03-13 DIAGNOSIS — Z3A14 14 weeks gestation of pregnancy: Secondary | ICD-10-CM | POA: Insufficient documentation

## 2020-03-13 DIAGNOSIS — O09292 Supervision of pregnancy with other poor reproductive or obstetric history, second trimester: Secondary | ICD-10-CM

## 2020-03-13 DIAGNOSIS — Z8759 Personal history of other complications of pregnancy, childbirth and the puerperium: Secondary | ICD-10-CM | POA: Insufficient documentation

## 2020-03-13 DIAGNOSIS — O26872 Cervical shortening, second trimester: Secondary | ICD-10-CM

## 2020-03-13 HISTORY — PX: CERVICAL CERCLAGE: SHX1329

## 2020-03-13 SURGERY — CERCLAGE, CERVIX, VAGINAL APPROACH
Anesthesia: Spinal

## 2020-03-13 MED ORDER — CEFAZOLIN SODIUM-DEXTROSE 2-4 GM/100ML-% IV SOLN
2.0000 g | INTRAVENOUS | Status: AC
  Administered 2020-03-13: 2 g via INTRAVENOUS

## 2020-03-13 MED ORDER — HYDROMORPHONE HCL 1 MG/ML IJ SOLN: 0.2500 mg | INTRAMUSCULAR | Status: DC | PRN

## 2020-03-13 MED ORDER — INDOMETHACIN 25 MG PO CAPS: 25.0000 mg | ORAL_CAPSULE | Freq: Three times a day (TID) | ORAL | 0 refills | Status: DC

## 2020-03-13 MED ORDER — WATER FOR IRRIGATION, STERILE IR SOLN
Status: DC | PRN
  Administered 2020-03-13: 1000 mL

## 2020-03-13 MED ORDER — PROMETHAZINE HCL 25 MG/ML IJ SOLN: 6.2500 mg | INTRAMUSCULAR | Status: DC | PRN

## 2020-03-13 MED ORDER — MEPERIDINE HCL 50 MG/ML IJ SOLN: 6.2500 mg | INTRAMUSCULAR | Status: DC | PRN

## 2020-03-13 MED ORDER — SURGILUBE EX GEL
CUTANEOUS | Status: DC | PRN
  Administered 2020-03-13: 1 via TOPICAL

## 2020-03-13 MED ORDER — CEFAZOLIN SODIUM-DEXTROSE 2-4 GM/100ML-% IV SOLN
INTRAVENOUS | Status: AC
  Filled 2020-03-13: qty 100

## 2020-03-13 MED ORDER — LACTATED RINGERS IV SOLN: Freq: Once | INTRAVENOUS | Status: AC

## 2020-03-13 MED ORDER — BUPIVACAINE IN DEXTROSE 0.75-8.25 % IT SOLN
INTRATHECAL | Status: DC | PRN
  Administered 2020-03-13: 9 mg via INTRATHECAL

## 2020-03-13 MED ORDER — INDOMETHACIN 50 MG RE SUPP
50.0000 mg | Freq: Once | RECTAL | Status: AC
  Administered 2020-03-13: 50 mg via RECTAL
  Filled 2020-03-13: qty 1

## 2020-03-13 MED ORDER — ONDANSETRON HCL 4 MG/2ML IJ SOLN
INTRAMUSCULAR | Status: AC
  Filled 2020-03-13: qty 2

## 2020-03-13 MED ORDER — PROGESTERONE 200 MG PO CAPS: 200.0000 mg | ORAL_CAPSULE | Freq: Every day | ORAL | 11 refills | Status: DC

## 2020-03-13 MED ORDER — ONDANSETRON HCL 4 MG/2ML IJ SOLN
INTRAMUSCULAR | Status: DC | PRN
  Administered 2020-03-13: 4 mg via INTRAVENOUS

## 2020-03-13 MED ORDER — 0.9 % SODIUM CHLORIDE (POUR BTL) OPTIME
TOPICAL | Status: DC | PRN
  Administered 2020-03-13: 13:00:00 1000 mL

## 2020-03-13 SURGICAL SUPPLY — 24 items
CATH ROBINSON RED A/P 16FR (CATHETERS) ×3 IMPLANT
CLOTH BEACON ORANGE TIMEOUT ST (SAFETY) ×3 IMPLANT
COVER LIGHT HANDLE STERIS (MISCELLANEOUS) ×6 IMPLANT
COVER WAND RF STERILE (DRAPES) ×3 IMPLANT
GAUZE 4X4 16PLY RFD (DISPOSABLE) ×3 IMPLANT
GLOVE BIO SURGEON STRL SZ7 (GLOVE) ×3 IMPLANT
GLOVE BIOGEL PI IND STRL 7.0 (GLOVE) ×2 IMPLANT
GLOVE BIOGEL PI IND STRL 8 (GLOVE) ×1 IMPLANT
GLOVE BIOGEL PI INDICATOR 7.0 (GLOVE) ×4
GLOVE BIOGEL PI INDICATOR 8 (GLOVE) ×2
GLOVE ECLIPSE 6.5 STRL STRAW (GLOVE) ×3 IMPLANT
GLOVE ECLIPSE 8.0 STRL XLNG CF (GLOVE) ×3 IMPLANT
GOWN STRL REUS W/TWL LRG LVL3 (GOWN DISPOSABLE) ×6 IMPLANT
GOWN STRL REUS W/TWL XL LVL3 (GOWN DISPOSABLE) ×3 IMPLANT
KIT TURNOVER KIT A (KITS) ×3 IMPLANT
MANIFOLD NEPTUNE II (INSTRUMENTS) ×3 IMPLANT
NS IRRIG 1000ML POUR BTL (IV SOLUTION) ×3 IMPLANT
PACK PERI GYN (CUSTOM PROCEDURE TRAY) ×3 IMPLANT
PAD ARMBOARD 7.5X6 YLW CONV (MISCELLANEOUS) ×3 IMPLANT
SET BASIN LINEN APH (SET/KITS/TRAYS/PACK) ×3 IMPLANT
SURGILUBE 2OZ TUBE FLIPTOP (MISCELLANEOUS) ×3 IMPLANT
SUT MERSILENE FIBER S 5 CTX 12 (SUTURE) ×3 IMPLANT
SUT SILK 2 0 SH (SUTURE) ×3 IMPLANT
WATER STERILE IRR 1000ML POUR (IV SOLUTION) ×3 IMPLANT

## 2020-03-13 NOTE — Anesthesia Preprocedure Evaluation (Signed)
Anesthesia Evaluation  Patient identified by MRN, date of birth, ID band Patient awake    Reviewed: Allergy & Precautions, NPO status , Patient's Chart, lab work & pertinent test results  Airway Mallampati: II  TM Distance: >3 FB Neck ROM: Full    Dental  (+) Teeth Intact, Dental Advisory Given   Pulmonary neg pulmonary ROS,    breath sounds clear to auscultation       Cardiovascular hypertension (not on meds),  Rhythm:Regular Rate:Normal     Neuro/Psych PSYCHIATRIC DISORDERS Anxiety Depression    GI/Hepatic negative GI ROS, Neg liver ROS,   Endo/Other  Hypothyroidism Morbid obesityPCOS  Renal/GU      Musculoskeletal   Abdominal   Peds  Hematology   Anesthesia Other Findings   Reproductive/Obstetrics (+) Pregnancy (IUG at 14 wks )                             Anesthesia Physical  Anesthesia Plan  ASA: III  Anesthesia Plan: Spinal   Post-op Pain Management:    Induction:   PONV Risk Score and Plan:   Airway Management Planned: Simple Face Mask  Additional Equipment:   Intra-op Plan:   Post-operative Plan:   Informed Consent: I have reviewed the patients History and Physical, chart, labs and discussed the procedure including the risks, benefits and alternatives for the proposed anesthesia with the patient or authorized representative who has indicated his/her understanding and acceptance.     Dental advisory given  Plan Discussed with: CRNA and Surgeon  Anesthesia Plan Comments:         Anesthesia Quick Evaluation

## 2020-03-13 NOTE — Anesthesia Procedure Notes (Signed)
Spinal  Patient location during procedure: OR Start time: 03/13/2020 1:27 PM End time: 03/13/2020 1:32 PM Staffing Performed: anesthesiologist  Anesthesiologist: Molli Barrows, MD Preanesthetic Checklist Completed: patient identified, IV checked, site marked, risks and benefits discussed, surgical consent, monitors and equipment checked, pre-op evaluation and timeout performed Spinal Block Patient position: sitting Prep: ChloraPrep Patient monitoring: heart rate, cardiac monitor, continuous pulse ox and blood pressure Approach: midline Location: L3-4 Injection technique: single-shot Needle Needle type: Sprotte  Needle gauge: 24 G Needle length: 9 cm Needle insertion depth: 7 cm Assessment Sensory level: T10

## 2020-03-13 NOTE — Progress Notes (Signed)
Pt up to bathroom. Unable to urinate. Pt states last time this happened as well and was told her bladder had no awakened from the spinal yet and was catherized and sent home and was able to urinate after that. Dr Despina Hidden was notified and vo to in and out cath pt and send her home. In and out cath performed without difficulty. Medium yellow urine returned that had mild odor and was . Advised pt to drink lots of water today. Pt states her fu apt is Tuesday. Pt in nad.

## 2020-03-13 NOTE — Transfer of Care (Signed)
Immediate Anesthesia Transfer of Care Note  Patient: Anahli A Eakes  Procedure(s) Performed: CERCLAGE CERVICAL (Mcdonald) (N/A )  Patient Location: PACU  Anesthesia Type:Spinal  Level of Consciousness: awake, alert  and patient cooperative  Airway & Oxygen Therapy: Patient Spontanous Breathing  Post-op Assessment: Report given to RN and Post -op Vital signs reviewed and stable  Post vital signs: Reviewed and stable  Last Vitals:  Vitals Value Taken Time  BP 118/65 03/13/20 1410  Temp 98.1   Pulse 98 03/13/20 1411  Resp 29 03/13/20 1411  SpO2 89 % 03/13/20 1411  Vitals shown include unvalidated device data.  Last Pain:  Vitals:   03/13/20 1147  TempSrc: Oral  PainSc: 0-No pain         Complications: No apparent anesthesia complications

## 2020-03-13 NOTE — Anesthesia Postprocedure Evaluation (Signed)
Anesthesia Post Note  Patient: Tara Mcmahon  Procedure(s) Performed: CERCLAGE CERVICAL (Mcdonald) (N/A )  Patient location during evaluation: PACU Anesthesia Type: Spinal Level of consciousness: awake and alert and patient cooperative Pain management: satisfactory to patient Vital Signs Assessment: post-procedure vital signs reviewed and stable Respiratory status: spontaneous breathing Cardiovascular status: stable Postop Assessment: no apparent nausea or vomiting, patient able to bend at knees and spinal receding Anesthetic complications: no     Last Vitals:  Vitals:   03/13/20 1445 03/13/20 1500  BP: 120/67 (P) 118/66  Pulse: 85 78  Resp: 16 18  Temp:    SpO2: 100% 100%    Last Pain:  Vitals:   03/13/20 1500  TempSrc:   PainSc: (P) 0-No pain                 Raja Caputi

## 2020-03-13 NOTE — H&P (Signed)
Preoperative History and Physical  Tara Mcmahon is a 24 y.o. [phone] with Patient's last menstrual period was 11/12/2019 (exact date). admitted for a placement of a McDonald cerclage .  She has a 26 week pregnancy delivery with her first pregnancy and was consistent with cervical incompetence I have placed cerclage x 2 with subsequent pregnancies which has resulted in term deliveries.  PMH:    Past Medical History:  Diagnosis Date  . Anxiety   . Cervical incompetence during pregnancy   . Depression   . Hypothyroidism   . PCOS (polycystic ovarian syndrome)   . Pregnancy induced hypertension    With second pregnancy    PSH:     Past Surgical History:  Procedure Laterality Date  . CERVICAL CERCLAGE    . CERVICAL CERCLAGE N/A 03/09/2016   Procedure: [facility] CERCLAGE PLACEMENT;  Surgeon: Lazaro Arms, MD;  Location: [facility];  Service: Gynecology;  Laterality: N/A;  . CERVICAL CERCLAGE N/A 01/31/2018   Procedure: CERCLAGE CERVICAL;  Surgeon: Lazaro Arms, MD;  Location: [facility];  Service: Gynecology;  Laterality: N/A;  . CERVICAL CERCLAGE     Placed in 2013, 2017, and 2019  . labial reduction      POb/GynH:      OB History    Gravida  5   Para  3   Term  2   Preterm  1   AB  1   Living  3     SAB  1   TAB      Ectopic      Multiple  0   Live Births  3           SH:   Social History   Tobacco Use  . Smoking status: Never Smoker  . Smokeless tobacco: Never Used  Substance Use Topics  . Alcohol use: Not Currently    Comment: occ; not now  . Drug use: No    FH:    Family History  Problem Relation Age of Onset  . Arthritis Mother   . Lupus Mother   . Autoimmune disease Mother   . Crohn's disease Mother   . Cancer Maternal Grandmother        breast  . Heart disease Maternal Grandfather   . Heart disease Paternal Grandfather   . Hypertension Paternal Grandfather   . Lupus Maternal Aunt      Allergies:  Allergies  Allergen Reactions   . Shellfish Allergy Anaphylaxis and Swelling    Only Crawfish not allergic to any other shellfish     Medications:      No current facility-administered medications for this encounter.  Current Outpatient Medications:  .  aspirin EC 81 MG tablet, Take 81 mg by mouth in the morning and at bedtime., Disp: , Rfl:  .  Doxylamine-Pyridoxine 10-10 MG TBEC, Start with 2 tablets every evening. If symptoms persist, add 1 tablet every morning. If symptoms persist, add 1 tablet at mid-day. (Patient taking differently: Take 1 tablet by mouth daily as needed (nausea or vomiting). ), Disp: 60 tablet, Rfl: 1 .  Prenatal Vit-Fe Fumarate-FA (MULTIVITAMIN-PRENATAL) 27-0.8 MG TABS tablet, Take 1 tablet by mouth daily at 12 noon., Disp: , Rfl:  .  Blood Pressure Monitor MISC, For regular home bp monitoring during pregnancy, Disp: 1 each, Rfl: 0  Review of Systems:   Review of Systems  Constitutional: Negative for fever, chills, weight loss, malaise/fatigue and diaphoresis.  HENT: Negative for hearing loss, ear pain, nosebleeds,  congestion, sore throat, neck pain, tinnitus and ear discharge.   Eyes: Negative for blurred vision, double vision, photophobia, pain, discharge and redness.  Respiratory: Negative for cough, hemoptysis, sputum production, shortness of breath, wheezing and stridor.   Cardiovascular: Negative for chest pain, palpitations, orthopnea, claudication, leg swelling and PND.  Gastrointestinal: Positive for abdominal pain. Negative for heartburn, nausea, vomiting, diarrhea, constipation, blood in stool and melena.  Genitourinary: Negative for dysuria, urgency, frequency, hematuria and flank pain.  Musculoskeletal: Negative for myalgias, back pain, joint pain and falls.  Skin: Negative for itching and rash.  Neurological: Negative for dizziness, tingling, tremors, sensory change, speech change, focal weakness, seizures, loss of consciousness, weakness and headaches.  Endo/Heme/Allergies:  Negative for environmental allergies and polydipsia. Does not bruise/bleed easily.  Psychiatric/Behavioral: Negative for depression, suicidal ideas, hallucinations, memory loss and substance abuse. The patient is not nervous/anxious and does not have insomnia.      PHYSICAL EXAM:  Last menstrual period 11/12/2019, not currently breastfeeding.    Vitals reviewed. Constitutional: She is oriented to person, place, and time. She appears well-developed and well-nourished.  HENT:  Head: Normocephalic and atraumatic.  Right Ear: External ear normal.  Left Ear: External ear normal.  Nose: Nose normal.  Mouth/Throat: Oropharynx is clear and moist.  Eyes: Conjunctivae and EOM are normal. Pupils are equal, round, and reactive to light. Right eye exhibits no discharge. Left eye exhibits no discharge. No scleral icterus.  Neck: Normal range of motion. Neck supple. No tracheal deviation present. No thyromegaly present.  Cardiovascular: Normal rate, regular rhythm, normal heart sounds and intact distal pulses.  Exam reveals no gallop and no friction rub.   No murmur heard. Respiratory: Effort normal and breath sounds normal. No respiratory distress. She has no wheezes. She has no rales. She exhibits no tenderness.  GI: Soft. Bowel sounds are normal. She exhibits no distension and no mass. There is tenderness. There is no rebound and no guarding.  Genitourinary:       Vulva is normal without lesions Vagina is pink moist without discharge Cervix normal in appearance and pap is normal Uterus is enlarged, 15 weeks size Adnexa is negative with normal sized ovaries by sonogram  Musculoskeletal: Normal range of motion. She exhibits no edema and no tenderness.  Neurological: She is alert and oriented to person, place, and time. She has normal reflexes. She displays normal reflexes. No cranial nerve deficit. She exhibits normal muscle tone. Coordination normal.  Skin: Skin is warm and dry. No rash noted. No  erythema. No pallor.  Psychiatric: She has a normal mood and affect. Her behavior is normal. Judgment and thought content normal.    Labs: Results for orders placed or performed during the hospital encounter of 03/11/20 (from the past 336 hour(s))  SARS CORONAVIRUS 2 (TAT 6-24 HRS) Nasopharyngeal Nasopharyngeal Swab   Collection Time: 03/11/20  1:34 PM   Specimen: Nasopharyngeal Swab  Result Value Ref Range   SARS Coronavirus 2 NEGATIVE NEGATIVE  CBC   Collection Time: 03/11/20  2:49 PM  Result Value Ref Range   WBC 9.4 4.0 - 10.5 K/uL   RBC 4.08 3.87 - 5.11 MIL/uL   Hemoglobin 12.1 12.0 - 15.0 g/dL   HCT 28.3 (L) 66.2 - 94.7 %   MCV 87.0 80.0 - 100.0 fL   MCH 29.7 26.0 - 34.0 pg   MCHC 34.1 30.0 - 36.0 g/dL   RDW 65.4 65.0 - 35.4 %   Platelets 337 150 - 400 K/uL   nRBC  0.0 0.0 - 0.2 %  Comprehensive metabolic panel   Collection Time: 03/11/20  2:49 PM  Result Value Ref Range   Sodium 135 135 - 145 mmol/L   Potassium 4.1 3.5 - 5.1 mmol/L   Chloride 104 98 - 111 mmol/L   CO2 22 22 - 32 mmol/L   Glucose, Bld 87 70 - 99 mg/dL   BUN 8 6 - 20 mg/dL   Creatinine, Ser 0.57 0.44 - 1.00 mg/dL   Calcium 9.0 8.9 - 10.3 mg/dL   Total Protein 6.8 6.5 - 8.1 g/dL   Albumin 3.4 (L) 3.5 - 5.0 g/dL   AST 13 (L) 15 - 41 U/L   ALT 13 0 - 44 U/L   Alkaline Phosphatase 58 38 - 126 U/L   Total Bilirubin 0.3 0.3 - 1.2 mg/dL   GFR calc non Af Amer >60 >60 mL/min   GFR calc Af Amer >60 >60 mL/min   Anion gap 9 5 - 15  Urinalysis, Routine w reflex microscopic   Collection Time: 03/11/20  2:51 PM  Result Value Ref Range   Color, Urine YELLOW YELLOW   APPearance HAZY (A) CLEAR   Specific Gravity, Urine 1.021 1.005 - 1.030   pH 5.0 5.0 - 8.0   Glucose, UA NEGATIVE NEGATIVE mg/dL   Hgb urine dipstick NEGATIVE NEGATIVE   Bilirubin Urine NEGATIVE NEGATIVE   Ketones, ur NEGATIVE NEGATIVE mg/dL   Protein, ur NEGATIVE NEGATIVE mg/dL   Nitrite NEGATIVE NEGATIVE   Leukocytes,Ua NEGATIVE NEGATIVE     EKG: Orders placed or performed during the hospital encounter of 10/22/17  . EKG 12-Lead  . EKG 12-Lead  . ED EKG within 10 minutes  . ED EKG within 10 minutes  . EKG    Imaging Studies: US Fetal Nuchal Translucency Measurement  Result Date: 02/26/2020 NUCHAL TRANSLUCENCY FOR INTEGRATED TESTING Tara Mcmahon is in the office for nuchal translucency sonogram as part of an integrated screen. She is a 24 y.o. year old [phone] with Estimated Date of Delivery: 09/06/20 by early ultrasound now at  [redacted]w[redacted]d weeks gestation. Thus far the pregnancy has been complicated by HX of cervical incompetence. GESTATION: SINGLETON FETAL ACTIVITY:          Heart rate         160          The fetus is active. AMNIOTIC FLUID: The amniotic fluid volume is  normal visually PLACENTA LOCALIZATION:  anterior GRADE 0 CERVIX: Measures appears closed ADNEXA: The ovaries are normal. GESTATIONAL AGE AND  BIOMETRICS: Gestational criteria: Estimated Date of Delivery: 09/06/20 by early ultrasound now at [redacted]w[redacted]d Previous Scans:1                      CROWN RUMP LENGTH           61.15 mm         12+4 weeks NUCHAL TRANSLUCENCY           1.6 mm         normal                                                                   AVERAGE EGA(BY THIS SCAN):  12+4 weeks The fetal nasal bone is identified.  TECHNICIAN COMMENTS: [place] 12+3 wks,measurements c/w dates,crl 61.15 mm,fhr 160 bpm,anterior placenta,subchorionic hemorrhage 3 x 1.6 x 1.8 cm,normal ovaries,NB present,NT 1.6 mm The patient will have the first blood draw of her integrated screening today and the second draw in approximately 4 weeks. [name] 02/25/2020 3:38 PM Clinical Impression and recommendations: fetal nuchal translucency evaluation I have reviewed the sonogram results above, combined with the patient's current clinical course, below are my impressions and any appropriate recommendations for management based on the sonographic findings. 1.  [id number]  Estimated Date of  Delivery: Estimated Date of Delivery: 09/06/20 by  early ultrasound and confirmed by today's sonographic dating 2.  Normal fetal sonographic findings, specifically normal nuchal translucency and present fetal nasal bone Additionally the cranium, both choroid plexuses, zygomatic arch, mandible, 4 equal extremities, stomach, bladder and anterior abdomen are all noted. 3.  Normal general sonographic findings, including anterior placenta, normal cervix 4. Small 1. 7 cm x 3 cm x 1.8 cm Subchorionic hemorrhage at left margin of anterior placenta. Recommend routine prenatal care based on this sonogram or as clinically indicated Tilda Burrow 02/26/2020 9:14 AM                                                     Assessment: [id number] [redacted]w[redacted]d Estimated Date of Delivery: 09/06/20 History of cervical incompetence Previous cerclage x 2 in past Patient Active Problem List   Diagnosis Date Noted  . History of gestational hypertension 02/26/2020  . Encounter for supervision of other normal pregnancy, unspecified trimester 02/25/2020  . Rubella non-immune status, antepartum 12/14/2017  . History of cervical incompetence in pregnancy, currently pregnant 11/23/2017  . History of preterm delivery, currently pregnant 11/23/2017  . History of PCOS 06/23/2017  . Subclinical hypothyroidism 02/04/2016  . History of suicidal ideation 02/03/2016  . Depression with anxiety 01/27/2016  . Family history of systemic lupus erythematosus (SLE) in mother 01/27/2016    Plan: Placement of McDonald cerclage for management of cervical incompetence  Lazaro Arms 03/13/2020 11:12 AM

## 2020-03-13 NOTE — Discharge Instructions (Signed)
Cervical Cerclage, Care After This sheet gives you information about how to care for yourself after your procedure. Your health care provider may also give you more specific instructions. If you have problems or questions, contact your health care provider. What can I expect after the procedure? After your procedure, it is common to have:  Cramping in your abdomen.  Mucus discharge from your vagina. This may last for several days.  Painful urination (dysuria).  Spotting, or small drops of blood coming from your vagina. Follow these instructions at home:  Medicines  Take over-the-counter and prescription medicines only as told by your health care provider.  Ask your health care provider if the medicine prescribed to you requires you to avoid driving or using heavy machinery. General instructions  If you are told to go on bed rest, follow instructions from your health care provider. You may need to be on bed rest for up to 3 days.  Keep track of your vaginal discharge and watch for any changes. If you notice changes, tell your health care provider.  Avoid physical activities and exercise until your health care provider approves. Ask your health care provider what activities are safe for you.  Do not douche or have sex until your health care provider says it is okay to do so.  Keep all follow-up visits, including prenatal visits, as told by your health care provider. This is important. ? Prenatal visits are all the care that you receive before the birth of your baby. ? You may also need an ultrasound.  You may be asked to have weekly visits to have your cervix checked. Contact a health care provider if you:  Have abnormal discharge from your vagina, such as clots.  Have a bad-smelling discharge from your vagina.  Develop a rash on your skin. This may look like redness and swelling.  Become light-headed or feel like you are going to faint.  Have abdominal pain that does not  get better with medicine.  Have nausea or vomiting that does not go away. Get help right away if you:  Have vaginal bleeding that is heavier or more frequent than spotting.  Are leaking fluid or your water breaks.  Have a fever or chills.  Faint.  Have uterine contractions. These may feel like: ? A back ache. ? Lower abdominal pain. ? Mild cramps, similar to menstrual cramps. ? Tightening or pressure in your abdomen.  Think that your baby is not moving as much as usual, or you cannot feel your baby move.  Have chest pain.  Have shortness of breath. Summary  After the procedure, it is common to have cramping, vaginal discharge, painful urination, and small drops of blood coming from your vagina.  If you are told to go on bed rest, follow instructions from your health care provider. You may need to be on bed rest for up to 3 days.  Keep track of your vaginal discharge and watch for any changes. If you notice changes, tell your health care provider.  Contact a health care provider if you have abnormal vaginal discharge, become light-headed, or have pain that cannot be controlled with medicines.  Get help right away if you have heavy vaginal bleeding, your water breaks, or you have uterine contractions. Also, get help right away if your baby is not moving as much as usual, or you have chest pain or shortness of breath. This information is not intended to replace advice given to you by your health care provider.  Make sure you discuss any questions you have with your health care provider. Document Revised: 09/10/2019 Document Reviewed: 07/10/2019 Elsevier Patient Education  [place].  Spinal Anesthesia and Epidural Anesthesia, Care After This sheet gives you information about how to care for yourself after your procedure. Your doctor may also give you more specific instructions. If you have problems or questions, call your doctor. Follow these instructions at home: For  at least 24 hours after the procedure:   Have a responsible adult stay with you. It is important to have someone help care for you until you are awake and alert.  Rest as needed.  Do not do activities where you could fall or get hurt (injured).  Do not drive.  Do not use heavy machinery.  Do not drink alcohol.  Do not take sleeping pills or medicines that make you sleepy (drowsy).  Do not make important decisions.  Do not sign legal documents.  Do not take care of children on your own. Eating and drinking  If you throw up (vomit), drink water, juice, or soup when nausea and vomiting stop.  Drink enough fluid to keep your pee (urine) pale yellow.  Make sure you do not feel like throwing up (nauseous) before you eat solid foods.  Follow the diet that your doctor recommends. General instructions  Return to your normal activities as told by your doctor. Ask your doctor what activities are safe for you.  Take over-the-counter and prescription medicines only as told by your doctor.  If you have sleep apnea, surgery and certain medicines can raise your risk for breathing problems. Follow instructions from your doctor about when to wear your sleep device. Your doctor may tell you to wear your sleep device: ? Anytime you are sleeping, including during daytime naps. ? While taking prescription pain medicines, sleeping pills, or medicines that make you sleepy.  Do not use any products that contain nicotine or tobacco. This includes cigarettes and e-cigarettes. ? If you need help quitting, ask your doctor. ? If you smoke, do not smoke by yourself. Make sure someone is nearby in case you need help.  Keep all follow-up visits as told by your doctor. This is important. Contact a doctor if:  It has been more than one day since your procedure and you feel like throwing up.  It has been more than one day since your procedure and you throw up.  You have a rash. Get help right away  if:  You have a fever.  You have a headache that lasts a long time.  You have a very bad headache.  Your vision is blurry.  You see two of a single object (double vision).  You are dizzy or light-headed.  You faint.  Your arms or legs tingle, feel weak, or get numb.  You have trouble breathing.  You cannot pee (urinate). Summary  After the procedure, have a responsible adult stay with you at home until you are fully awake and alert.  Do not do activities that might get you injured. Do not drive, use heavy machinery, drink alcohol, or make important decisions for 24 hours after the procedure.  Take medicines as told by your doctor. Do not use products that contain nicotine or tobacco.  Get help right away if you have a fever, blurry vision, difficulty breathing or passing urine, or weakness or numbness in arms or legs. This information is not intended to replace advice given to you by your health care provider.  Make sure you discuss any questions you have with your health care provider. Document Revised: 10/27/2017 Document Reviewed: 03/07/2016 Elsevier Patient Education  2020 [employer].

## 2020-03-13 NOTE — Op Note (Signed)
Preoperative diagnosis:  1.  Intrauterine pregnancy at [redacted]w[redacted]d  weeks gestation                                         2.  history of cervical incompetence with previous pregnancy                                         3.  History of previous McDonald cerclage x 2   Postoperative diagnosis:  Same as above  Procedure:  Placement of a McDonald cerclage, using 5 mm Mersilene tape  Surgeon:  Rockne Coons MD  Anesthesia: Spinal.  Findings: Today intraoperatively her cervix is patulous soft mucousy.  It does appear shortened at least to the level of the bladder reflection.  I was able to get a good 2-2-1/2 cm length circumferentially tied down to a tight fingertip.  Description of operation:  Patient was taken to the operating room where she underwent a spinal anesthetic in the sitting position.  She was then placed in the dorsal lithotomy position using candycane stirrups.  I then prepped and draped in the lower abdomen inner thighs perineum perirectal and vaginal area.  Her bladder was drained with A red rubber catheter.  A medium length weighted speculum was placed posteriorly.  2 Deaver retractors were used anteriorly and laterally for retraction and visualization.  A 5 mm Mersilene tape was employed.  It was placed in a counterclockwise fashion at 5 points circumferentially about the cervix.  It was then tied down to a tight fingertip tension at 12:00.  To facilitate future removal a 2-0 silk suture was placed above the knot and tied down around  The Mersilene tape and were then cut.    The patient tolerated the procedure well.  She experienced approximately 10 cc of blood loss.  She was taken to the PACU in good stable condition with all counts being correct.  She received a gram of Ancef preoperatively prophylactically. She will be seen in the office next week for her postoperative visit  Willmer Fellers H 03/13/2020 2:11 PM

## 2020-03-17 ENCOUNTER — Other Ambulatory Visit: Payer: Self-pay

## 2020-03-17 ENCOUNTER — Encounter: Payer: Self-pay | Admitting: Women's Health

## 2020-03-17 ENCOUNTER — Ambulatory Visit (INDEPENDENT_AMBULATORY_CARE_PROVIDER_SITE_OTHER): Payer: Medicaid Other | Admitting: Women's Health

## 2020-03-17 VITALS — BP 135/82 | HR 104 | Wt 213.0 lb

## 2020-03-17 DIAGNOSIS — O09899 Supervision of other high risk pregnancies, unspecified trimester: Secondary | ICD-10-CM

## 2020-03-17 DIAGNOSIS — O3430 Maternal care for cervical incompetence, unspecified trimester: Secondary | ICD-10-CM

## 2020-03-17 DIAGNOSIS — Z3A15 15 weeks gestation of pregnancy: Secondary | ICD-10-CM

## 2020-03-17 DIAGNOSIS — Z8742 Personal history of other diseases of the female genital tract: Secondary | ICD-10-CM

## 2020-03-17 DIAGNOSIS — O09299 Supervision of pregnancy with other poor reproductive or obstetric history, unspecified trimester: Secondary | ICD-10-CM

## 2020-03-17 DIAGNOSIS — Z1389 Encounter for screening for other disorder: Secondary | ICD-10-CM

## 2020-03-17 DIAGNOSIS — Z3482 Encounter for supervision of other normal pregnancy, second trimester: Secondary | ICD-10-CM

## 2020-03-17 DIAGNOSIS — O26872 Cervical shortening, second trimester: Secondary | ICD-10-CM

## 2020-03-17 DIAGNOSIS — Z331 Pregnant state, incidental: Secondary | ICD-10-CM

## 2020-03-17 LAB — POCT URINALYSIS DIPSTICK OB
Blood, UA: NEGATIVE
Glucose, UA: NEGATIVE
Ketones, UA: NEGATIVE
Leukocytes, UA: NEGATIVE
Nitrite, UA: NEGATIVE
POC,PROTEIN,UA: NEGATIVE

## 2020-03-17 NOTE — Progress Notes (Signed)
LOW-RISK PREGNANCY VISIT Patient name: Tara Mcmahon MRN [id number]  Date of birth: 02-14-1996 Chief Complaint:   Routine Prenatal Visit  History of Present Illness:   Tara Mcmahon is a 24 y.o. [phone] female at [redacted]w[redacted]d with an Estimated Date of Delivery: 09/06/20 being seen today for ongoing management of a low-risk pregnancy.  Depression screen [facility] 2/9 02/25/2020 12/13/2017 06/23/2017  Decreased Interest 0 1 0  Down, Depressed, Hopeless 0 1 0  PHQ - 2 Score 0 2 0  Altered sleeping - 0 -  Tired, decreased energy 1 1 -  Change in appetite 0 0 -  Feeling bad or failure about yourself  0 0 -  Trouble concentrating 0 0 -  Moving slowly or fidgety/restless 0 1 -  Suicidal thoughts 0 0 -  PHQ-9 Score - 4 -  Difficult doing work/chores Not difficult at all Not difficult at all -    Today she reports felt some leakage of discharge while in waiting room- could just be prometrium, but wants to check it out. Had McDonald cerclage placed 03/13/20, no problems since. Wants to know if it is safe to get Covid vaccine during pregnancy. Concerned b/c she has risk factors for severe disease (obesity, pregnancy, etc). Discussed w/ [facility], recent study indicating safety of mom during pregnancy, hesitant for pt to get d/t her anxiety and doesn't want her to link vaccine in case of adverse pregnancy outcome- pt states she is ok getting it as long as that is his only reservation w/ her getting it during pregnancy.  Contractions: Not present. Vag. Bleeding: None.  Movement: Present. denies leaking of fluid. Review of Systems:   Pertinent items are noted in HPI Denies abnormal vaginal discharge w/ itching/odor/irritation, headaches, visual changes, shortness of breath, chest pain, abdominal pain, severe nausea/vomiting, or problems with urination or bowel movements unless otherwise stated above. Pertinent History Reviewed:  Reviewed past medical,surgical, social, obstetrical and family history.  Reviewed  problem list, medications and allergies. Physical Assessment:   Vitals:   03/17/20 1617  BP: 135/82  Pulse: (!) 104  Weight: 213 lb (96.6 kg)  Body mass index is 43.02 kg/m.        Physical Examination:   General appearance: Well appearing, and in no distress  Mental status: Alert, oriented to person, place, and time  Skin: Warm & dry  Cardiovascular: Normal heart rate noted  Respiratory: Normal respiratory effort, no distress  Abdomen: Soft, gravid, nontender  Pelvic: Spec exam: cx visually closed w/ cerclage in place, mucousy white nonodorous d/c         Extremities: Edema: None  Fetal Status: Fetal Heart Rate (bpm): 153   Movement: Present    Chaperone: Amanda Rash    Results for orders placed or performed in visit on 03/17/20 (from the past 24 hour(s))  POC Urinalysis Dipstick OB   Collection Time: 03/17/20  4:20 PM  Result Value Ref Range   Color, UA     Clarity, UA     Glucose, UA Negative Negative   Bilirubin, UA     Ketones, UA neg    Spec Grav, UA     Blood, UA neg    pH, UA     POC,PROTEIN,UA Negative Negative, Trace, Small (1+), Moderate (2+), Large (3+), 4+   Urobilinogen, UA     Nitrite, UA neg    Leukocytes, UA Negative Negative   Appearance     Odor      Assessment & Plan:  1) Low-risk pregnancy [id number] at [redacted]w[redacted]d with an Estimated Date of Delivery: 09/06/20   2) H/O cervical incompetence s/p McDonald cerclage, stable  3) H/O PTB>on prometrium q hs  4) H/O GHTN> ASA 162mg  daily   Meds: No orders of the defined types were placed in this encounter.  Labs/procedures today: spec exam  Plan:  Continue routine obstetrical care  Next visit: prefers in person    Reviewed: Preterm labor symptoms and general obstetric precautions including but not limited to vaginal bleeding, contractions, leaking of fluid and fetal movement were reviewed in detail with the patient.  All questions were answered.  Follow-up: Return in about 3 weeks (around 04/07/2020)  for [place], [facility], 2nd IT, in person, MD or CNM.  Orders Placed This Encounter  Procedures  . POC Urinalysis Dipstick OB   Roma Schanz CNM, [facility] 03/17/2020 4:58 PM

## 2020-03-17 NOTE — Patient Instructions (Signed)
Tara Mcmahon, I greatly value your feedback.  If you receive a survey following your visit with [place] today, we appreciate you taking the time to fill it out.  Thanks, Joellyn Haff, CNM, WHNP-BC  [facility] ([address] [place], [place] [address]) Entrance C, located off of [address] valet parking  Go to [email or web address] to register for FREE online childbirth classes  [place] Pediatricians/Family Doctors:  [facility] [phone]            [facility] [phone]                 [facility] [phone] (usually not accepting new patients unless you have family there already, you are always welcome to call and ask)       [facility] Department [phone]       [facility] Pediatricians/Family Doctors:   [facility]: [phone]  [facility]: [phone]  [facility]: [phone]  [facility] Doctors:   [facility]: [phone]   [facility]: [phone]  [facility] Doctors:  [facility]: [phone]    Home Blood Pressure Monitoring for Patients   Your provider has recommended that you check your blood pressure (BP) at least once a week at home. If you do not have a blood pressure cuff at home, one will be provided for you. Contact your provider if you have not received your monitor within 1 week.   Helpful Tips for Accurate Home Blood Pressure Checks  . Don't smoke, exercise, or drink caffeine 30 minutes before checking your BP . Use the restroom before checking your BP (a full bladder can raise your pressure) . Relax in a comfortable upright chair . Feet on the ground . Left arm resting comfortably on a flat surface at the level of your heart . Legs uncrossed . Back supported . Sit quietly and don't talk . Place the cuff on your bare arm . Adjust snuggly, so  that only two fingertips can fit between your skin and the top of the cuff . Check 2 readings separated by at least one minute . Keep a log of your BP readings . For a visual, please reference this diagram: http://ccnc.care/bpdiagram  Provider Name: [facility]     Phone: [phone]  Zone 1: ALL CLEAR  Continue to monitor your symptoms:  . BP reading is less than 140 (top number) or less than 90 (bottom number)  . No right upper stomach pain . No headaches or seeing spots . No feeling nauseated or throwing up . No swelling in face and hands  Zone 2: CAUTION Call your doctor's office for any of the following:  . BP reading is greater than 140 (top number) or greater than 90 (bottom number)  . Stomach pain under your ribs in the middle or right side . Headaches or seeing spots . Feeling nauseated or throwing up . Swelling in face and hands  Zone 3: EMERGENCY  Seek immediate medical care if you have any of the following:  . BP reading is greater than160 (top number) or greater than 110 (bottom number) . Severe headaches not improving with Tylenol . Serious difficulty catching your breath . Any worsening symptoms from Zone 2     Second Trimester of Pregnancy The second trimester is from week 14 through week 27 (months 4 through 6). The second trimester is often a time when you feel your  best. Your body has adjusted to being pregnant, and you begin to feel better physically. Usually, morning sickness has lessened or quit completely, you may have more energy, and you may have an increase in appetite. The second trimester is also a time when the fetus is growing rapidly. At the end of the sixth month, the fetus is about 9 inches long and weighs about 1 pounds. You will likely begin to feel the baby move (quickening) between 16 and 20 weeks of pregnancy. Body changes during your second trimester Your body continues to go through many changes during your second trimester. The  changes vary from woman to woman.  Your weight will continue to increase. You will notice your lower abdomen bulging out.  You may begin to get stretch marks on your hips, abdomen, and breasts.  You may develop headaches that can be relieved by medicines. The medicines should be approved by your health care provider.  You may urinate more often because the fetus is pressing on your bladder.  You may develop or continue to have heartburn as a result of your pregnancy.  You may develop constipation because certain hormones are causing the muscles that push waste through your intestines to slow down.  You may develop hemorrhoids or swollen, bulging veins (varicose veins).  You may have back pain. This is caused by: ? Weight gain. ? Pregnancy hormones that are relaxing the joints in your pelvis. ? A shift in weight and the muscles that support your balance.  Your breasts will continue to grow and they will continue to become tender.  Your gums may bleed and may be sensitive to brushing and flossing.  Dark spots or blotches (chloasma, mask of pregnancy) may develop on your face. This will likely fade after the baby is born.  A dark line from your belly button to the pubic area (linea nigra) may appear. This will likely fade after the baby is born.  You may have changes in your hair. These can include thickening of your hair, rapid growth, and changes in texture. Some women also have hair loss during or after pregnancy, or hair that feels dry or thin. Your hair will most likely return to normal after your baby is born.  What to expect at prenatal visits During a routine prenatal visit:  You will be weighed to make sure you and the fetus are growing normally.  Your blood pressure will be taken.  Your abdomen will be measured to track your baby's growth.  The fetal heartbeat will be listened to.  Any test results from the previous visit will be discussed.  Your health care  provider may ask you:  How you are feeling.  If you are feeling the baby move.  If you have had any abnormal symptoms, such as leaking fluid, bleeding, severe headaches, or abdominal cramping.  If you are using any tobacco products, including cigarettes, chewing tobacco, and electronic cigarettes.  If you have any questions.  Other tests that may be performed during your second trimester include:  Blood tests that check for: ? Low iron levels (anemia). ? High blood sugar that affects pregnant women (gestational diabetes) between 71 and 28 weeks. ? Rh antibodies. This is to check for a protein on red blood cells (Rh factor).  Urine tests to check for infections, diabetes, or protein in the urine.  An ultrasound to confirm the proper growth and development of the baby.  An amniocentesis to check for possible genetic problems.  Fetal  screens for spina bifida and Down syndrome.  HIV (human immunodeficiency virus) testing. Routine prenatal testing includes screening for HIV, unless you choose not to have this test.  Follow these instructions at home: Medicines  Follow your health care provider's instructions regarding medicine use. Specific medicines may be either safe or unsafe to take during pregnancy.  Take a prenatal vitamin that contains at least 600 micrograms (mcg) of folic acid.  If you develop constipation, try taking a stool softener if your health care provider approves. Eating and drinking  Eat a balanced diet that includes fresh fruits and vegetables, whole grains, good sources of protein such as meat, eggs, or tofu, and low-fat dairy. Your health care provider will help you determine the amount of weight gain that is right for you.  Avoid raw meat and uncooked cheese. These carry germs that can cause birth defects in the baby.  If you have low calcium intake from food, talk to your health care provider about whether you should take a daily calcium  supplement.  Limit foods that are high in fat and processed sugars, such as fried and sweet foods.  To prevent constipation: ? Drink enough fluid to keep your urine clear or pale yellow. ? Eat foods that are high in fiber, such as fresh fruits and vegetables, whole grains, and beans. Activity  Exercise only as directed by your health care provider. Most women can continue their usual exercise routine during pregnancy. Try to exercise for 30 minutes at least 5 days a week. Stop exercising if you experience uterine contractions.  Avoid heavy lifting, wear low heel shoes, and practice good posture.  A sexual relationship may be continued unless your health care provider directs you otherwise. Relieving pain and discomfort  Wear a good support bra to prevent discomfort from breast tenderness.  Take warm sitz baths to soothe any pain or discomfort caused by hemorrhoids. Use hemorrhoid cream if your health care provider approves.  Rest with your legs elevated if you have leg cramps or low back pain.  If you develop varicose veins, wear support hose. Elevate your feet for 15 minutes, 3-4 times a day. Limit salt in your diet. Prenatal Care  Write down your questions. Take them to your prenatal visits.  Keep all your prenatal visits as told by your health care provider. This is important. Safety  Wear your seat belt at all times when driving.  Make a list of emergency phone numbers, including numbers for family, friends, the hospital, and police and fire departments. General instructions  Ask your health care provider for a referral to a local prenatal education class. Begin classes no later than the beginning of month 6 of your pregnancy.  Ask for help if you have counseling or nutritional needs during pregnancy. Your health care provider can offer advice or refer you to specialists for help with various needs.  Do not use hot tubs, steam rooms, or saunas.  Do not douche or use  tampons or scented sanitary pads.  Do not cross your legs for long periods of time.  Avoid cat litter boxes and soil used by cats. These carry germs that can cause birth defects in the baby and possibly loss of the fetus by miscarriage or stillbirth.  Avoid all smoking, herbs, alcohol, and unprescribed drugs. Chemicals in these products can affect the formation and growth of the baby.  Do not use any products that contain nicotine or tobacco, such as cigarettes and e-cigarettes. If you need help  quitting, ask your health care provider.  Visit your dentist if you have not gone yet during your pregnancy. Use a soft toothbrush to brush your teeth and be gentle when you floss. Contact a health care provider if:  You have dizziness.  You have mild pelvic cramps, pelvic pressure, or nagging pain in the abdominal area.  You have persistent nausea, vomiting, or diarrhea.  You have a bad smelling vaginal discharge.  You have pain when you urinate. Get help right away if:  You have a fever.  You are leaking fluid from your vagina.  You have spotting or bleeding from your vagina.  You have severe abdominal cramping or pain.  You have rapid weight gain or weight loss.  You have shortness of breath with chest pain.  You notice sudden or extreme swelling of your face, hands, ankles, feet, or legs.  You have not felt your baby move in over an hour.  You have severe headaches that do not go away when you take medicine.  You have vision changes. Summary  The second trimester is from week 14 through week 27 (months 4 through 6). It is also a time when the fetus is growing rapidly.  Your body goes through many changes during pregnancy. The changes vary from woman to woman.  Avoid all smoking, herbs, alcohol, and unprescribed drugs. These chemicals affect the formation and growth your baby.  Do not use any tobacco products, such as cigarettes, chewing tobacco, and e-cigarettes. If you  need help quitting, ask your health care provider.  Contact your health care provider if you have any questions. Keep all prenatal visits as told by your health care provider. This is important. This information is not intended to replace advice given to you by your health care provider. Make sure you discuss any questions you have with your health care provider. Document Released: 11/08/2001 Document Revised: 04/21/2016 Document Reviewed: 01/15/2013 Elsevier Interactive Patient Education  2017 [place] FLU! Because you are pregnant, we at [facility], along with the Centers for Disease Control (CDC), recommend that you receive the flu vaccine to protect yourself and your baby from the flu. The flu is more likely to cause severe illness in pregnant women than in women of reproductive age who are not pregnant. Changes in the immune system, heart, and lungs during pregnancy make pregnant women (and women up to two weeks postpartum) more prone to severe illness from flu, including illness resulting in hospitalization. Flu also may be harmful for a pregnant woman's developing baby. A common flu symptom is fever, which may be associated with neural tube defects and other adverse outcomes for a developing baby. Getting vaccinated can also help protect a baby after birth from flu. (Mom passes antibodies onto the developing baby during her pregnancy.)  A Flu Vaccine is the Best Protection Against Flu Getting a flu vaccine is the first and most important step in protecting against flu. Pregnant women should get a flu shot and not the live attenuated influenza vaccine (LAIV), also known as nasal spray flu vaccine. Flu vaccines given during pregnancy help protect both the mother and her baby from flu. Vaccination has been shown to reduce the risk of flu-associated acute respiratory infection in pregnant women by up to one-half. A 2018 study showed that getting a flu shot  reduced a pregnant woman's risk of being hospitalized with flu by an average of 40 percent. Pregnant women who get a  flu vaccine are also helping to protect their babies from flu illness for the first several months after their birth, when they are too young to get vaccinated.   A Long Record of Safety for Flu Shots in Pregnant Women Flu shots have been given to millions of pregnant women over many years with a good safety record. There is a lot of evidence that flu vaccines can be given safely during pregnancy; though these data are limited for the first trimester. The CDC recommends that pregnant women get vaccinated during any trimester of their pregnancy. It is very important for pregnant women to get the flu shot.   Other Preventive Actions In addition to getting a flu shot, pregnant women should take the same everyday preventive actions the CDC recommends of everyone, including covering coughs, washing hands often, and avoiding people who are sick.  Symptoms and Treatment If you get sick with flu symptoms call your doctor right away. There are antiviral drugs that can treat flu illness and prevent serious flu complications. The CDC recommends prompt treatment for people who have influenza infection or suspected influenza infection and who are at high risk of serious flu complications, such as people with asthma, diabetes (including gestational diabetes), or heart disease. Early treatment of influenza in hospitalized pregnant women has been shown to reduce the length of the hospital stay.  Symptoms Flu symptoms include fever, cough, sore throat, runny or stuffy nose, body aches, headache, chills and fatigue. Some people may also have vomiting and diarrhea. People may be infected with the flu and have respiratory symptoms without a fever.  Early Treatment is Important for Pregnant Women Treatment should begin as soon as possible because antiviral drugs work best when started early (within 48 hours  after symptoms start). Antiviral drugs can make your flu illness milder and make you feel better faster. They may also prevent serious health problems that can result from flu illness. Oral oseltamivir (Tamiflu) is the preferred treatment for pregnant women because it has the most studies available to suggest that it is safe and beneficial. Antiviral drugs require a prescription from your provider. Having a fever caused by flu infection or other infections early in pregnancy may be linked to birth defects in a baby. In addition to taking antiviral drugs, pregnant women who get a fever should treat their fever with Tylenol (acetaminophen) and contact their provider immediately.  When to [place] If you are pregnant and have any of these signs, seek care immediately:  Difficulty breathing or shortness of breath  Pain or pressure in the chest or abdomen  Sudden dizziness  Confusion  Severe or persistent vomiting  High fever that is not responding to Tylenol (or store brand equivalent)  Decreased or no movement of your baby  [email or web address].[email or web address]

## 2020-03-19 DIAGNOSIS — F341 Dysthymic disorder: Secondary | ICD-10-CM | POA: Diagnosis not present

## 2020-03-26 DIAGNOSIS — F341 Dysthymic disorder: Secondary | ICD-10-CM | POA: Diagnosis not present

## 2020-03-28 ENCOUNTER — Inpatient Hospital Stay (HOSPITAL_BASED_OUTPATIENT_CLINIC_OR_DEPARTMENT_OTHER): Payer: Medicaid Other

## 2020-03-28 ENCOUNTER — Inpatient Hospital Stay (HOSPITAL_COMMUNITY)
Admission: AD | Admit: 2020-03-28 | Discharge: 2020-03-28 | Disposition: A | Discharge status: Home / Self Care | Payer: Medicaid Other | Attending: Obstetrics and Gynecology | Admitting: Obstetrics and Gynecology

## 2020-03-28 ENCOUNTER — Encounter (HOSPITAL_COMMUNITY): Payer: Self-pay | Admitting: Obstetrics and Gynecology

## 2020-03-28 ENCOUNTER — Other Ambulatory Visit: Payer: Self-pay

## 2020-03-28 DIAGNOSIS — R300 Dysuria: Secondary | ICD-10-CM | POA: Diagnosis not present

## 2020-03-28 DIAGNOSIS — R109 Unspecified abdominal pain: Secondary | ICD-10-CM | POA: Diagnosis not present

## 2020-03-28 DIAGNOSIS — E282 Polycystic ovarian syndrome: Secondary | ICD-10-CM | POA: Insufficient documentation

## 2020-03-28 DIAGNOSIS — O3432 Maternal care for cervical incompetence, second trimester: Secondary | ICD-10-CM | POA: Diagnosis not present

## 2020-03-28 DIAGNOSIS — O99282 Endocrine, nutritional and metabolic diseases complicating pregnancy, second trimester: Secondary | ICD-10-CM | POA: Diagnosis not present

## 2020-03-28 DIAGNOSIS — O26892 Other specified pregnancy related conditions, second trimester: Secondary | ICD-10-CM | POA: Insufficient documentation

## 2020-03-28 DIAGNOSIS — Z3686 Encounter for antenatal screening for cervical length: Secondary | ICD-10-CM | POA: Diagnosis not present

## 2020-03-28 DIAGNOSIS — Z3A16 16 weeks gestation of pregnancy: Secondary | ICD-10-CM

## 2020-03-28 DIAGNOSIS — Z79899 Other long term (current) drug therapy: Secondary | ICD-10-CM | POA: Diagnosis not present

## 2020-03-28 DIAGNOSIS — O99212 Obesity complicating pregnancy, second trimester: Secondary | ICD-10-CM | POA: Insufficient documentation

## 2020-03-28 LAB — URINALYSIS, ROUTINE W REFLEX MICROSCOPIC
Bacteria, UA: NONE SEEN
Bilirubin Urine: NEGATIVE
Glucose, UA: NEGATIVE mg/dL
Hgb urine dipstick: NEGATIVE
Ketones, ur: NEGATIVE mg/dL
Leukocytes,Ua: NEGATIVE
Nitrite: NEGATIVE
Protein, ur: 30 mg/dL — AB
Specific Gravity, Urine: 1.025 (ref 1.005–1.030)
pH: 5 (ref 5.0–8.0)

## 2020-03-28 MED ORDER — DOCUSATE SODIUM 100 MG PO CAPS: 100.0000 mg | ORAL_CAPSULE | Freq: Two times a day (BID) | ORAL | 0 refills | Status: DC

## 2020-03-28 NOTE — Discharge Instructions (Signed)
Cervical Insufficiency Cervical insufficiency is when the cervix is weak and starts to open (dilate) and thin (efface) before the pregnancy is at term and before labor starts. This is also called incompetent cervix. It can happen during the second or third trimester when the fetus starts putting pressure on the cervix. Treatment may reduce the risk of problems for you and your baby. Cervical insufficiency can lead to:  Loss of the baby (miscarriage).  Breaking of the sac that holds the baby (amniotic sac). This is also called preterm premature rupture of the membranes,PPROM.  The baby being born early (preterm birth). What are the causes? The cause of this condition is not well known. However, it may be caused by abnormalities in the cervix and other factors such as inflammation or infection. What increases the risk? This condition is more likely to develop if:  You have a shorter cervix than normal.  Your cervix was damaged or injured during a past pregnancy or surgery.  You were born with a cervical defect.  You have had a procedure done on the cervix, such as cervical biopsy.  You have a history of: ? Cervical insufficiency. ? PPROM.  You have ended several pregnancies through abortion.  You were exposed to the drug diethylstilbestrol (DES). What are the signs or symptoms? Symptoms of this condition can vary. Sometimes there no symptoms for this condition, and at other times there are mild symptoms that start between weeks 14 and 20 of pregnancy. The symptoms may last several days or weeks. These symptoms include:  Light spotting or bleeding from the vagina.  Pelvic pressure.  A change in vaginal discharge, such as changes from clear, white, or light yellow to pink or tan.  Back pain.  Abdominal pain or cramping. How is this diagnosed? This condition may be diagnosed based on:  Your symptoms.  Your medical history, including: ? Any problems during past pregnancies,  such as miscarriages. ? Any procedures performed on your cervix. ? Any history of cervical insufficiency. During the second trimester, cervical insufficiency may be diagnosed based on:  An ultrasound done with a probe inserted into your vagina (transvaginal ultrasound).  A pelvic exam.  Tests of fluid in the amniotic sac. This is done to rule out infection. How is this treated? This condition may be managed by:  Limiting physical activity.  Limiting activity at home or in the hospital.  Pelvic rest. This means that there should be no sexual intercourse or placing anything in the vagina.  A procedure to sew the cervix closed and prevent it from opening too early (cerclage). The stitches (sutures) are removed between weeks 36 and 38 to avoid problems during labor. Cerclage may be recommended if: ? You have a history of miscarriages or preterm births without a known cause. ? You have a short cervix. A short cervix is identified by ultrasound. ? Your cervix has dilated before 24 weeks of pregnancy. Follow these instructions at home:  Get plenty of rest and lessen activity as told by your health care provider. Ask your health care provider what activities are safe for you.  If pelvic rest was recommended, you shouldnot have sex, use tampons, douche, or place anything inside your vagina until your health care provider says that this is okay.  Take over-the-counter and prescription medicines only as told by your health care provider.  Keep all follow-up visits and prenatal visits as told by your health care provider. This is important. Get help right away if:    You have vaginal bleeding, even if it is a small amount or even if it is painless.  You have pain in your abdomen or your lower back.  You have a feeling of increased pressure in your pelvis.  You have vaginal discharge that changes from clear, white, or light yellow to pink or tan.  You have a fever.  You have severe  nausea or vomiting. Summary  Cervical insufficiency is when the cervix is weak and starts to dilate and efface before the pregnancy is at term and before labor starts.  Symptoms of this condition can vary from no symptoms to mild symptoms that start between weeks 14 and 20 of pregnancy. The symptoms may last several days or weeks.  This condition may be managed by limiting physical activity, having pelvic rest, or having cervical cerclage.  If pelvic rest was recommended, you should not have sex, use tampons, use a douche, or place anything inside your vagina until your health care provider says that this is okay. This information is not intended to replace advice given to you by your health care provider. Make sure you discuss any questions you have with your health care provider. Document Revised: 10/27/2017 Document Reviewed: 11/17/2016 Elsevier Patient Education  2020 Elsevier Inc.  

## 2020-03-28 NOTE — MAU Note (Signed)
Tara Mcmahon is a 24 y.o. at [redacted]w[redacted]d here in MAU reporting: started having heavy cramping this morning. Is concerned since she has a cerclage. Mainly feels the pain in her back. No bleeding or discharge.  Onset of complaint: today  Pain score: 4/10  Vitals:   03/28/20 1413  BP: 125/78  Pulse: 100  Resp: 16  Temp: 98.2 F (36.8 C)  SpO2: 99%     FHT: 150  Lab orders placed from triage: UA

## 2020-03-28 NOTE — MAU Provider Note (Signed)
Chief Complaint: Abdominal Pain   First Provider Initiated Contact with Patient 03/28/20 1418     SUBJECTIVE HPI: Tara Mcmahon is a 24 y.o. [id number] at [redacted]w[redacted]d who presents to [facility] reporting abdominal cramping. Symptoms started this morning. Had cerclage placed a few weeks ago. Used progesterone & is on pelvic rest. Denies vomiting, diarrhea, constipation, vaginal bleeding, or LOF. Some dysuria for the last few days.   Location: abdomen Quality: cramping Severity: 4/10 on pain scale Duration: hours Timing: intermittent Modifying factors: none Associated signs and symptoms: none  Past Medical History:  Diagnosis Date  . Anxiety   . Cervical incompetence during pregnancy   . Depression   . Hypothyroidism   . PCOS (polycystic ovarian syndrome)   . Pregnancy induced hypertension    With second pregnancy   OB History  Gravida Para Term Preterm AB Living  5 3 2 1 1 3   SAB TAB Ectopic Multiple Live Births  1     0 3    # Outcome Date GA Lbr Len/2nd Weight Sex Delivery Anes PTL Lv  5 Current           4 Term 07/19/18 [redacted]w[redacted]d 16:23 / 04:43 3630 g F Vag-Spont EPI  LIV     Birth Comments: WNL     Complications: Pre-eclampsia  3 Term 08/30/16 [redacted]w[redacted]d 07:05 / 03:13 2695 g F Vag-Spont EPI Y LIV     Complications: History of cervical cerclage, Gestational hypertension  2 Preterm 09/19/12 [redacted]w[redacted]d  822 g F Vag-Spont None Y LIV     Complications: Cervical incompetence, History of preterm premature rupture of membranes (PPROM)  1 SAB 2013           Past Surgical History:  Procedure Laterality Date  . CERVICAL CERCLAGE    . CERVICAL CERCLAGE N/A 03/09/2016   Procedure: [facility] CERCLAGE PLACEMENT;  Surgeon: Florian Buff, MD;  Location: [facility];  Service: Gynecology;  Laterality: N/A;  . CERVICAL CERCLAGE N/A 01/31/2018   Procedure: CERCLAGE CERVICAL;  Surgeon: Florian Buff, MD;  Location: [facility];  Service: Gynecology;  Laterality: N/A;  . CERVICAL CERCLAGE     Placed in 2013,  2017, and 2019  . CERVICAL CERCLAGE N/A 03/13/2020   Procedure: CERCLAGE CERVICAL (Mcdonald);  Surgeon: Florian Buff, MD;  Location: [facility];  Service: Gynecology;  Laterality: N/A;  . labial reduction     Social History   Socioeconomic History  . Marital status: Married    Spouse name: Not on file  . Number of children: Not on file  . Years of education: Not on file  . Highest education level: Not on file  Occupational History  . Not on file  Tobacco Use  . Smoking status: Never Smoker  . Smokeless tobacco: Never Used  Substance and Sexual Activity  . Alcohol use: Not Currently    Comment: occ; not now  . Drug use: No  . Sexual activity: Yes    Birth control/protection: None  Other Topics Concern  . Not on file  Social History Narrative  . Not on file   Social Determinants of Health   Financial Resource Strain: Low Risk   . Difficulty of Paying Living Expenses: Not hard at all  Food Insecurity: No Food Insecurity  . Worried About Charity fundraiser in the Last Year: Never true  . Ran Out of Food in the Last Year: Never true  Transportation Needs: No Transportation Needs  . Lack of Transportation (Medical):  No  . Lack of Transportation (Non-Medical): No  Physical Activity: Inactive  . Days of Exercise per Week: 0 days  . Minutes of Exercise per Session: 0 min  Stress: No Stress Concern Present  . Feeling of Stress : Only a little  Social Connections: Somewhat Isolated  . Frequency of Communication with Friends and Family: Three times a week  . Frequency of Social Gatherings with Friends and Family: Never  . Attends Religious Services: Never  . Active Member of Clubs or Organizations: No  . Attends Banker Meetings: Never  . Marital Status: Married  Catering manager Violence: Not At Risk  . Fear of Current or Ex-Partner: No  . Emotionally Abused: No  . Physically Abused: No  . Sexually Abused: No   Family History  Problem Relation Age of Onset   . Arthritis Mother   . Lupus Mother   . Autoimmune disease Mother   . Crohn's disease Mother   . Cancer Maternal Grandmother        breast  . Heart disease Maternal Grandfather   . Heart disease Paternal Grandfather   . Hypertension Paternal Grandfather   . Lupus Maternal Aunt    No current facility-administered medications on file prior to encounter.   Current Outpatient Medications on File Prior to Encounter  Medication Sig Dispense Refill  . aspirin EC 81 MG tablet Take 81 mg by mouth in the morning and at bedtime.    . Blood Pressure Monitor MISC For regular home bp monitoring during pregnancy 1 each 0  . Prenatal Vit-Fe Fumarate-FA (MULTIVITAMIN-PRENATAL) 27-0.8 MG TABS tablet Take 1 tablet by mouth daily at 12 noon.    . progesterone (PROMETRIUM) 200 MG capsule Place 1 capsule (200 mg total) vaginally at bedtime. 30 capsule 11   Allergies  Allergen Reactions  . Shellfish Allergy Anaphylaxis and Swelling    Only Crawfish not allergic to any other shellfish     I have reviewed patient's Past Medical Hx, Surgical Hx, Family Hx, Social Hx, medications and allergies.   Review of Systems  Constitutional: Negative.   Gastrointestinal: Positive for abdominal pain. Negative for constipation, diarrhea, nausea and vomiting.  Genitourinary: Negative.     OBJECTIVE Patient Vitals for the past 24 hrs:  BP Temp Temp src Pulse Resp SpO2  03/28/20 1701 121/78 -- -- 99 16 99 %  03/28/20 1413 125/78 98.2 F (36.8 C) Oral 100 16 99 %   Constitutional: Well-developed, well-nourished female in no acute distress.  Cardiovascular: normal rate & rhythm, no murmur Respiratory: normal rate and effort. Lung sounds clear throughout GI: Abd soft, non-tender, Pos BS x 4. No guarding or rebound tenderness MS: Extremities nontender, no edema, normal ROM Neurologic: Alert and oriented x 4.  GU:     SPECULUM EXAM: NEFG, physiologic discharge, no blood noted, cerclage suture visualized, cervix  visually closed. Digital exam deferred.   LAB RESULTS Results for orders placed or performed during the hospital encounter of 03/28/20 (from the past 24 hour(s))  Urinalysis, Routine w reflex microscopic     Status: Abnormal   Collection Time: 03/28/20  2:26 PM  Result Value Ref Range   Color, Urine AMBER (A) YELLOW   APPearance HAZY (A) CLEAR   Specific Gravity, Urine 1.025 1.005 - 1.030   pH 5.0 5.0 - 8.0   Glucose, UA NEGATIVE NEGATIVE mg/dL   Hgb urine dipstick NEGATIVE NEGATIVE   Bilirubin Urine NEGATIVE NEGATIVE   Ketones, ur NEGATIVE NEGATIVE mg/dL   Protein,  ur 30 (A) NEGATIVE mg/dL   Nitrite NEGATIVE NEGATIVE   Leukocytes,Ua NEGATIVE NEGATIVE   RBC / HPF 0-5 0 - 5 RBC/hpf   WBC, UA 0-5 0 - 5 WBC/hpf   Bacteria, UA NONE SEEN NONE SEEN   Squamous Epithelial / LPF 11-20 0 - 5   Mucus PRESENT     IMAGING No results found.  MAU COURSE Orders Placed This Encounter  Procedures  . [place] MFM OB TRANSVAGINAL  . Urinalysis, Routine w reflex microscopic  . Discharge patient   Meds ordered this encounter  Medications  . docusate sodium (COLACE) 100 MG capsule    Sig: Take 1 capsule (100 mg total) by mouth 2 (two) times daily.    Dispense:  60 capsule    Refill:  0    Order Specific Question:   Supervising Provider    Answer:   Catalina Pizza [[id number]]    MDM FHT present via doppler  Cervix visually closed  U/a negative for signs of infection  TVUS - cervical length 1.6 cm Reviewed images with Dr. Jeanann Lewandowsky. ?funneling proximal to cerclage?  Patient stable for discharge. Currently has cerclage in place & is using prometrium as prescribed Sees. Dr. Emelda Fear 5/11  ASSESSMENT 1. Abdominal pain during pregnancy in second trimester   2. [redacted] weeks gestation of pregnancy   3. Cervical cerclage suture present in second trimester     PLAN Discharge home in stable condition. Discussed reasons to return to MAU Continue pelvic rest Continue prometrium   Allergies as of  03/28/2020      Reactions   Shellfish Allergy Anaphylaxis, Swelling   Only Crawfish not allergic to any other shellfish       Medication List    STOP taking these medications   Doxylamine-Pyridoxine 10-10 MG Tbec   indomethacin 25 MG capsule Commonly known as: INDOCIN     TAKE these medications   aspirin EC 81 MG tablet Take 81 mg by mouth in the morning and at bedtime.   Blood Pressure Monitor Misc For regular home bp monitoring during pregnancy   docusate sodium 100 MG capsule Commonly known as: COLACE Take 1 capsule (100 mg total) by mouth 2 (two) times daily.   multivitamin-prenatal 27-0.8 MG Tabs tablet Take 1 tablet by mouth daily at 12 noon.   progesterone 200 MG capsule Commonly known as: Prometrium Place 1 capsule (200 mg total) vaginally at bedtime.        Judeth Horn, NP 03/28/2020  5:29 PM

## 2020-04-03 DIAGNOSIS — F341 Dysthymic disorder: Secondary | ICD-10-CM | POA: Diagnosis not present

## 2020-04-06 ENCOUNTER — Other Ambulatory Visit: Payer: Self-pay | Admitting: Women's Health

## 2020-04-06 DIAGNOSIS — O09299 Supervision of pregnancy with other poor reproductive or obstetric history, unspecified trimester: Secondary | ICD-10-CM

## 2020-04-06 DIAGNOSIS — Z363 Encounter for antenatal screening for malformations: Secondary | ICD-10-CM

## 2020-04-06 DIAGNOSIS — O3432 Maternal care for cervical incompetence, second trimester: Secondary | ICD-10-CM

## 2020-04-07 ENCOUNTER — Ambulatory Visit (INDEPENDENT_AMBULATORY_CARE_PROVIDER_SITE_OTHER): Payer: Medicaid Other | Admitting: Obstetrics and Gynecology

## 2020-04-07 ENCOUNTER — Other Ambulatory Visit: Payer: Self-pay

## 2020-04-07 ENCOUNTER — Encounter: Payer: Self-pay | Admitting: Obstetrics and Gynecology

## 2020-04-07 ENCOUNTER — Ambulatory Visit (INDEPENDENT_AMBULATORY_CARE_PROVIDER_SITE_OTHER): Payer: Medicaid Other

## 2020-04-07 VITALS — BP 138/78 | HR 89 | Wt 209.0 lb

## 2020-04-07 DIAGNOSIS — Z3480 Encounter for supervision of other normal pregnancy, unspecified trimester: Secondary | ICD-10-CM

## 2020-04-07 DIAGNOSIS — Z363 Encounter for antenatal screening for malformations: Secondary | ICD-10-CM

## 2020-04-07 DIAGNOSIS — O09213 Supervision of pregnancy with history of pre-term labor, third trimester: Secondary | ICD-10-CM | POA: Diagnosis not present

## 2020-04-07 DIAGNOSIS — Z3A18 18 weeks gestation of pregnancy: Secondary | ICD-10-CM

## 2020-04-07 DIAGNOSIS — O3432 Maternal care for cervical incompetence, second trimester: Secondary | ICD-10-CM

## 2020-04-07 DIAGNOSIS — Z1389 Encounter for screening for other disorder: Secondary | ICD-10-CM

## 2020-04-07 DIAGNOSIS — O09299 Supervision of pregnancy with other poor reproductive or obstetric history, unspecified trimester: Secondary | ICD-10-CM

## 2020-04-07 DIAGNOSIS — Z331 Pregnant state, incidental: Secondary | ICD-10-CM

## 2020-04-07 DIAGNOSIS — Z3482 Encounter for supervision of other normal pregnancy, second trimester: Secondary | ICD-10-CM

## 2020-04-07 LAB — POCT URINALYSIS DIPSTICK OB
Blood, UA: NEGATIVE
Glucose, UA: NEGATIVE
Ketones, UA: NEGATIVE
Leukocytes, UA: NEGATIVE
Nitrite, UA: NEGATIVE
POC,PROTEIN,UA: NEGATIVE

## 2020-04-07 NOTE — Progress Notes (Signed)
[place] TA/TV:18+2 wks,breech,anterior placenta gr 0,CX length 5.3-6 cm with and w/o pressure,cx length from cerclage to external os 1.3 cm,svp of fluid 4.4 cm,fhr 150 bpm,efw 246 g 61%,anatomy complete,no obvious abnormalities

## 2020-04-07 NOTE — Progress Notes (Addendum)
PATIENT ID: Arrie Aran, female     DOB: 1996-01-18, 24 y.o.     MRN: [id number]    LOW-RISK PREGNANCY VISIT PATIENT NAME: YOHANNA TOW MRN [id number]  DOB: 03-31-1996  Chief Complaint:   Routine Prenatal Visit (anatomy)  History of Present Illness:   INDEA DEARMAN is a 24 y.o. [id number] female at [redacted]w[redacted]d with an Estimated Date of Delivery: 09/06/20 being seen today for ongoing management of a low-risk pregnancy.   She was seen recently in the MAU for heavy cramping on 03/28/2020 with complaints of back pain and dysuria. She denied bleeding or discharge.   Component     Latest Ref Rng & Units 03/28/2020  Color, Urine     YELLOW AMBER (A)  Appearance     CLEAR HAZY (A)  Specific Gravity, Urine     1.005 - 1.030 1.025  pH     5.0 - 8.0 5.0  Glucose, UA     NEGATIVE mg/dL NEGATIVE  Hgb urine dipstick     NEGATIVE NEGATIVE  Bilirubin Urine     NEGATIVE NEGATIVE  Ketones, ur     NEGATIVE mg/dL NEGATIVE  Protein     NEGATIVE mg/dL 30 (A)  Nitrite     NEGATIVE NEGATIVE  RBC / HPF     0 - 5 RBC/hpf 0-5  WBC, UA     0 - 5 WBC/hpf 0-5  Bacteria, UA     NONE SEEN NONE SEEN  Squamous Epithelial / LPF     0 - 5 11-20  Mucus      PRESENT  Leukocytes,Ua     NEGATIVE NEGATIVE    Depression screen [facility] 2/9 02/25/2020 12/13/2017 06/23/2017  Decreased Interest 0 1 0  Down, Depressed, Hopeless 0 1 0  PHQ - 2 Score 0 2 0  Altered sleeping - 0 -  Tired, decreased energy 1 1 -  Change in appetite 0 0 -  Feeling bad or failure about yourself  0 0 -  Trouble concentrating 0 0 -  Moving slowly or fidgety/restless 0 1 -  Suicidal thoughts 0 0 -  PHQ-9 Score - 4 -  Difficult doing work/chores Not difficult at all Not difficult at all -    Today she reports no complaints. Contractions: Not present. Vag. Bleeding: None.  Movement: Present. denies leaking of fluid.  Review of Systems:   Pertinent items are noted in HPI Denies abnormal vaginal discharge w/  itching/odor/irritation, headaches, visual changes, shortness of breath, chest pain, abdominal pain, severe nausea/vomiting, or problems with urination or bowel movements unless otherwise stated above.  Pertinent History Reviewed:  Reviewed past medical,surgical, social, obstetrical and family history.  Reviewed problem list, medications and allergies.  Physical Assessment:   Vitals:   04/07/20 1652  BP: 138/78  Pulse: 89  Weight: 209 lb (94.8 kg)  Body mass index is 42.21 kg/m.        Physical Examination:   General appearance: Well appearing, and in no distress  Mental status: Alert, oriented to person, place, and time  Skin: Warm & dry  Cardiovascular: Normal heart rate noted  Respiratory: Normal respiratory effort, no distress  Abdomen: Soft, gravid, nontender  Pelvic: Cervical exam deferred         Extremities: Edema: None  Fetal Status:     Movement: Present    Chaperone: [employer]    Results for orders placed or performed in visit on 04/07/20 (from the past 24 hour(s))  POC Urinalysis Dipstick  OB   Collection Time: 04/07/20  4:41 PM  Result Value Ref Range   Color, UA     Clarity, UA     Glucose, UA Negative Negative   Bilirubin, UA     Ketones, UA neg    Spec Grav, UA     Blood, UA neg    pH, UA     POC,PROTEIN,UA Negative Negative, Trace, Small (1+), Moderate (2+), Large (3+), 4+   Urobilinogen, UA     Nitrite, UA neg    Leukocytes, UA Negative Negative   Appearance     Odor      Assessment & Plan:  1) Low-risk pregnancy [id number] at [redacted]w[redacted]d with an Estimated Date of Delivery: 09/06/20   2) mcdonald cerclage in plase, merselene with good cervical length.   Meds: No orders of the defined types were placed in this encounter.  Labs/procedures today: u/s  Plan:  Continue routine obstetrical care  Next visit: prefers in person    Reviewed: Term labor symptoms and general obstetric precautions including but not limited to vaginal bleeding, contractions,  leaking of fluid and fetal movement were reviewed in detail with the patient.  All questions were answered. Check bp weekly, let us know if >140/90.   Follow-up: No follow-ups on file.    By signing my name below, I, [employer], attest that this documentation has been prepared under the direction and in the presence of Tilda Burrow, MD. Electronically Signed: Mal Misty Medical Scribe. 04/07/20. 4:57 PM.  I personally performed the services described in this documentation, which was SCRIBED in my presence. The recorded information has been reviewed and considered accurate. It has been edited as necessary during review. Tilda Burrow, MD

## 2020-04-09 LAB — INTEGRATED 2
AFP MoM: 1.02
Alpha-Fetoprotein: 34.2 ng/mL
Crown Rump Length: 61.2 mm
DIA MoM: 1.54
DIA Value: 198.7 pg/mL
Estriol, Unconjugated: 2.44 ng/mL
Gest. Age on Collection Date: 12.4 weeks
Gestational Age: 18.4 weeks
Maternal Age at EDD: 26.2 yr
Nuchal Translucency (NT): 1.6 mm
Nuchal Translucency MoM: 1.17
Number of Fetuses: 1
PAPP-A MoM: 0.83
PAPP-A Value: 515.3 ng/mL
Test Results:: NEGATIVE
Weight: 217 [lb_av]
Weight: 217 [lb_av]
hCG MoM: 2.29
hCG Value: 42.3 IU/mL
uE3 MoM: 1.74

## 2020-04-10 DIAGNOSIS — F341 Dysthymic disorder: Secondary | ICD-10-CM | POA: Diagnosis not present

## 2020-04-13 ENCOUNTER — Telehealth: Payer: Self-pay | Admitting: Obstetrics & Gynecology

## 2020-04-13 ENCOUNTER — Other Ambulatory Visit: Payer: Self-pay | Admitting: Women's Health

## 2020-04-13 MED ORDER — METRONIDAZOLE 500 MG PO TABS: 500.0000 mg | ORAL_TABLET | Freq: Two times a day (BID) | ORAL | 0 refills | Status: DC

## 2020-04-13 NOTE — Telephone Encounter (Signed)
Pt is having some discharge and wants someone to f/u with her she suspects its something similar in a previous pregnancy that she had. Is wondering if she needs to have this symptom addressed in an e-visit or face to face.

## 2020-04-13 NOTE — Telephone Encounter (Signed)
Pt aware Flagyl was sent to pharmacy and if not better after Flagyl, she needs to be seen. Pt voiced understanding. JSY

## 2020-04-13 NOTE — Telephone Encounter (Addendum)
Pt is on Progesterone supp. Pt has a discharge that's thin and white. + burning around vaginal area; + "chemical odor"; ; has no way to the office, Zenaida Niece is in the shop. Pt states it's like when she had BV  With a previous pregnancy. Please advise. Thanks!! JSY

## 2020-04-16 DIAGNOSIS — F341 Dysthymic disorder: Secondary | ICD-10-CM | POA: Diagnosis not present
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
## 2020-04-23 DIAGNOSIS — F341 Dysthymic disorder: Secondary | ICD-10-CM | POA: Diagnosis not present

## 2020-04-29 ENCOUNTER — Other Ambulatory Visit: Payer: Self-pay | Admitting: Obstetrics and Gynecology

## 2020-04-29 DIAGNOSIS — O3432 Maternal care for cervical incompetence, second trimester: Secondary | ICD-10-CM

## 2020-04-29 DIAGNOSIS — O09299 Supervision of pregnancy with other poor reproductive or obstetric history, unspecified trimester: Secondary | ICD-10-CM

## 2020-04-30 ENCOUNTER — Encounter: Payer: Medicaid Other | Admitting: Obstetrics & Gynecology

## 2020-04-30 ENCOUNTER — Other Ambulatory Visit: Payer: Medicaid Other

## 2020-04-30 ENCOUNTER — Telehealth: Payer: Self-pay | Admitting: Obstetrics & Gynecology

## 2020-04-30 NOTE — Telephone Encounter (Signed)
Patient called this morning to  Cx/rs todays appointment explained to pt the ultrasound next available would be 6/21 offered this to her and pt requested nurse to give her a call to see if the ultrasound if necessary. Pt wanted to wait to r/s appointments til after she spoke with nurse/doctor
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
## 2020-05-05 DIAGNOSIS — F341 Dysthymic disorder: Secondary | ICD-10-CM | POA: Diagnosis not present

## 2020-05-07 ENCOUNTER — Ambulatory Visit (INDEPENDENT_AMBULATORY_CARE_PROVIDER_SITE_OTHER): Payer: Medicaid Other | Admitting: Obstetrics & Gynecology

## 2020-05-07 ENCOUNTER — Encounter: Payer: Self-pay | Admitting: Obstetrics & Gynecology

## 2020-05-07 VITALS — BP 125/78 | HR 111 | Wt 209.0 lb

## 2020-05-07 DIAGNOSIS — Z3A22 22 weeks gestation of pregnancy: Secondary | ICD-10-CM

## 2020-05-07 DIAGNOSIS — Z3480 Encounter for supervision of other normal pregnancy, unspecified trimester: Secondary | ICD-10-CM

## 2020-05-07 DIAGNOSIS — Z331 Pregnant state, incidental: Secondary | ICD-10-CM

## 2020-05-07 DIAGNOSIS — O3432 Maternal care for cervical incompetence, second trimester: Secondary | ICD-10-CM

## 2020-05-07 DIAGNOSIS — Z1389 Encounter for screening for other disorder: Secondary | ICD-10-CM

## 2020-05-07 LAB — POCT URINALYSIS DIPSTICK OB
Blood, UA: NEGATIVE
Glucose, UA: NEGATIVE
Ketones, UA: NEGATIVE
Leukocytes, UA: NEGATIVE
Nitrite, UA: NEGATIVE
POC,PROTEIN,UA: NEGATIVE

## 2020-05-07 NOTE — Progress Notes (Signed)
LOW-RISK PREGNANCY VISIT Patient name: Tara Mcmahon MRN [id number]  Date of birth: 1996/10/28 Chief Complaint:   Routine Prenatal Visit  History of Present Illness:   Tara Mcmahon is a 24 y.o. [phone] female at [redacted]w[redacted]d with an Estimated Date of Delivery: 09/06/20 being seen today for ongoing management of a low-risk pregnancy.  Depression screen [facility] 2/9 02/25/2020 12/13/2017 06/23/2017  Decreased Interest 0 1 0  Down, Depressed, Hopeless 0 1 0  PHQ - 2 Score 0 2 0  Altered sleeping - 0 -  Tired, decreased energy 1 1 -  Change in appetite 0 0 -  Feeling bad or failure about yourself  0 0 -  Trouble concentrating 0 0 -  Moving slowly or fidgety/restless 0 1 -  Suicidal thoughts 0 0 -  PHQ-9 Score - 4 -  Difficult doing work/chores Not difficult at all Not difficult at all -    Today she reports no complaints. Discharge that does not have a fishy odor but is more than normal Contractions: Irritability. Vag. Bleeding: None.  Movement: Present. denies leaking of fluid. Review of Systems:   Pertinent items are noted in HPI Denies abnormal vaginal discharge w/ itching/odor/irritation, headaches, visual changes, shortness of breath, chest pain, abdominal pain, severe nausea/vomiting, or problems with urination or bowel movements unless otherwise stated above. Pertinent History Reviewed:  Reviewed past medical,surgical, social, obstetrical and family history.  Reviewed problem list, medications and allergies. Physical Assessment:   Vitals:   05/07/20 1630  BP: 125/78  Pulse: (!) 111  Weight: 209 lb (94.8 kg)  Body mass index is 42.21 kg/m.        Physical Examination:   General appearance: Well appearing, and in no distress  Mental status: Alert, oriented to person, place, and time  Skin: Warm & dry  Cardiovascular: Normal heart rate noted  Respiratory: Normal respiratory effort, no distress  Abdomen: Soft, gravid, nontender  Pelvic: Cervical exam deferred          Extremities: Edema: None  Fetal Status:     Movement: Present    Chaperone: Rolena Infante    Results for orders placed or performed in visit on 05/07/20 (from the past 24 hour(s))  POC Urinalysis Dipstick OB   Collection Time: 05/07/20  4:32 PM  Result Value Ref Range   Color, UA     Clarity, UA     Glucose, UA Negative Negative   Bilirubin, UA     Ketones, UA n    Spec Grav, UA     Blood, UA n    pH, UA     POC,PROTEIN,UA Negative Negative, Trace, Small (1+), Moderate (2+), Large (3+), 4+   Urobilinogen, UA     Nitrite, UA n    Leukocytes, UA Negative Negative   Appearance     Odor      Assessment & Plan:  1) Low-risk pregnancy [id number] at [redacted]w[redacted]d with an Estimated Date of Delivery: 09/06/20   2) Cervical incompetence, S/P mcDonald cerclage with vaginal prometrium   Meds: No orders of the defined types were placed in this encounter.  Labs/procedures today: spec exam normal no BV or yeast minimal cervical mucous today  Plan:  Continue routine obstetrical care  Next visit: prefers in person    Reviewed: Preterm labor symptoms and general obstetric precautions including but not limited to vaginal bleeding, contractions, leaking of fluid and fetal movement were reviewed in detail with the patient.  All questions were answered.  home bp  cuff. Rx faxed to . Check bp weekly, let us know if >140/90.   Follow-up: Return in about 4 weeks (around 06/04/2020) for PN2, HROB.  Orders Placed This Encounter  Procedures  . POC Urinalysis Dipstick OB   Lazaro Arms  05/07/2020 4:52 PM

## 2020-05-14 DIAGNOSIS — F341 Dysthymic disorder: Secondary | ICD-10-CM | POA: Diagnosis not present

## 2020-05-21 DIAGNOSIS — F341 Dysthymic disorder: Secondary | ICD-10-CM | POA: Diagnosis not present

## 2020-05-28 DIAGNOSIS — F411 Generalized anxiety disorder: Secondary | ICD-10-CM | POA: Diagnosis not present

## 2020-06-05 ENCOUNTER — Other Ambulatory Visit (HOSPITAL_COMMUNITY)
Admission: RE | Admit: 2020-06-05 | Discharge: 2020-06-05 | Disposition: A | Discharge status: Home / Self Care | Payer: Medicaid Other | Source: Ambulatory Visit | Attending: Obstetrics & Gynecology | Admitting: Obstetrics & Gynecology

## 2020-06-05 ENCOUNTER — Ambulatory Visit (INDEPENDENT_AMBULATORY_CARE_PROVIDER_SITE_OTHER): Payer: Medicaid Other | Admitting: Obstetrics & Gynecology

## 2020-06-05 ENCOUNTER — Other Ambulatory Visit: Payer: Medicaid Other

## 2020-06-05 ENCOUNTER — Encounter: Payer: Self-pay | Admitting: Obstetrics & Gynecology

## 2020-06-05 VITALS — BP 139/76 | HR 118 | Wt 213.0 lb

## 2020-06-05 DIAGNOSIS — O3432 Maternal care for cervical incompetence, second trimester: Secondary | ICD-10-CM

## 2020-06-05 DIAGNOSIS — Z23 Encounter for immunization: Secondary | ICD-10-CM | POA: Diagnosis not present

## 2020-06-05 DIAGNOSIS — F411 Generalized anxiety disorder: Secondary | ICD-10-CM | POA: Diagnosis not present

## 2020-06-05 DIAGNOSIS — O0990 Supervision of high risk pregnancy, unspecified, unspecified trimester: Secondary | ICD-10-CM | POA: Diagnosis not present

## 2020-06-05 DIAGNOSIS — L299 Pruritus, unspecified: Secondary | ICD-10-CM | POA: Diagnosis not present

## 2020-06-05 DIAGNOSIS — Z131 Encounter for screening for diabetes mellitus: Secondary | ICD-10-CM

## 2020-06-05 DIAGNOSIS — Z331 Pregnant state, incidental: Secondary | ICD-10-CM

## 2020-06-05 DIAGNOSIS — Z1389 Encounter for screening for other disorder: Secondary | ICD-10-CM

## 2020-06-05 DIAGNOSIS — Z3A26 26 weeks gestation of pregnancy: Secondary | ICD-10-CM | POA: Diagnosis not present

## 2020-06-05 DIAGNOSIS — Z3480 Encounter for supervision of other normal pregnancy, unspecified trimester: Secondary | ICD-10-CM

## 2020-06-05 LAB — POCT URINALYSIS DIPSTICK OB
Blood, UA: NEGATIVE
Glucose, UA: NEGATIVE
Ketones, UA: NEGATIVE
Nitrite, UA: NEGATIVE

## 2020-06-05 NOTE — Progress Notes (Signed)
LOW-RISK PREGNANCY VISIT Patient name: Tara Mcmahon MRN [id number]  Date of birth: January 25, 1996 Chief Complaint:   High Risk Gestation (PN2 today; itching all over; pap today)  History of Present Illness:   Tara Mcmahon is a 24 y.o. [phone] female at [redacted]w[redacted]d with an Estimated Date of Delivery: 09/06/20 being seen today for ongoing management of a low-risk pregnancy.  Depression screen [facility] 2/9 06/05/2020 02/25/2020 12/13/2017 06/23/2017  Decreased Interest 1 0 1 0  Down, Depressed, Hopeless 0 0 1 0  PHQ - 2 Score 1 0 2 0  Altered sleeping 1 - 0 -  Tired, decreased energy 1 1 1  -  Change in appetite 0 0 0 -  Feeling bad or failure about yourself  0 0 0 -  Trouble concentrating 0 0 0 -  Moving slowly or fidgety/restless 0 0 1 -  Suicidal thoughts 0 0 0 -  PHQ-9 Score 3 - 4 -  Difficult doing work/chores Not difficult at all Not difficult at all Not difficult at all -    Today she reports itching all over without rash. Contractions: Irregular. Vag. Bleeding: None.  Movement: Present. denies leaking of fluid. Review of Systems:   Pertinent items are noted in HPI Denies abnormal vaginal discharge w/ itching/odor/irritation, headaches, visual changes, shortness of breath, chest pain, abdominal pain, severe nausea/vomiting, or problems with urination or bowel movements unless otherwise stated above. Pertinent History Reviewed:  Reviewed past medical,surgical, social, obstetrical and family history.  Reviewed problem list, medications and allergies. Physical Assessment:   Vitals:   06/05/20 1035  BP: 139/76  Pulse: (!) 118  Weight: 213 lb (96.6 kg)  Body mass index is 43.02 kg/m.        Physical Examination:   General appearance: Well appearing, and in no distress  Mental status: Alert, oriented to person, place, and time  Skin: Warm & dry  Cardiovascular: Normal heart rate noted  Respiratory: Normal respiratory effort, no distress  Abdomen: Soft, gravid, nontender  Pelvic:  Cervical exam performed  Dilation: Closed Effacement (%): Thick Station: Ballotable  Extremities: Edema: None  Fetal Status: Fetal Heart Rate (bpm): 144 Fundal Height: 30 cm Movement: Present Presentation: Vertex  Chaperone: 08/06/20    Results for orders placed or performed in visit on 06/05/20 (from the past 24 hour(s))  POC Urinalysis Dipstick OB   Collection Time: 06/05/20 10:36 AM  Result Value Ref Range   Color, UA     Clarity, UA     Glucose, UA Negative Negative   Bilirubin, UA     Ketones, UA neg    Spec Grav, UA     Blood, UA neg    pH, UA     POC,PROTEIN,UA Trace Negative, Trace, Small (1+), Moderate (2+), Large (3+), 4+   Urobilinogen, UA     Nitrite, UA neg    Leukocytes, UA Moderate (2+) (A) Negative   Appearance     Odor      Assessment & Plan:  1) Low-risk pregnancy 08/06/20 at [redacted]w[redacted]d with an Estimated Date of Delivery: 09/06/20   2) Cerclage in place stable,   3) itching all over without rash, Bile acids, fasting, performed today   Meds: No orders of the defined types were placed in this encounter.  Labs/procedures today: pending  Plan:  Continue routine obstetrical care  Next visit: prefers in person    Reviewed: Preterm labor symptoms and general obstetric precautions including but not limited to vaginal bleeding, contractions, leaking of  fluid and fetal movement were reviewed in detail with the patient.  All questions were answered. Has home bp cuff. Rx faxed to  Check bp weekly, let us know if >140/90.   Follow-up: Return in about 3 weeks (around 06/26/2020) for LROB, with Dr Despina Hidden.  Orders Placed This Encounter  Procedures  . Tdap vaccine greater than or equal to 7yo IM  . POC Urinalysis Dipstick OB   Amaryllis Dyke Jobie Popp 06/05/2020 11:59 AM

## 2020-06-07 LAB — CBC
Hematocrit: 35.2 % (ref 34.0–46.6)
Hemoglobin: 11.6 g/dL (ref 11.1–15.9)
MCH: 29.4 pg (ref 26.6–33.0)
MCHC: 33 g/dL (ref 31.5–35.7)
MCV: 89 fL (ref 79–97)
Platelets: 345 10*3/uL (ref 150–450)
RBC: 3.95 x10E6/uL (ref 3.77–5.28)
RDW: 12 % (ref 11.7–15.4)
WBC: 12.4 10*3/uL — ABNORMAL HIGH (ref 3.4–10.8)

## 2020-06-07 LAB — COMPREHENSIVE METABOLIC PANEL WITH GFR
ALT: 10 [IU]/L (ref 0–32)
AST: 11 [IU]/L (ref 0–40)
Albumin/Globulin Ratio: 1.2 (ref 1.2–2.2)
Albumin: 3.5 g/dL — ABNORMAL LOW (ref 3.9–5.0)
Alkaline Phosphatase: 92 [IU]/L (ref 48–121)
BUN/Creatinine Ratio: 13 (ref 9–23)
BUN: 7 mg/dL (ref 6–20)
Bilirubin Total: <0.2 mg/dL (ref 0.0–1.2)
CO2: 21 mmol/L (ref 20–29)
Calcium: 9.3 mg/dL (ref 8.7–10.2)
Chloride: 104 mmol/L (ref 96–106)
Creatinine, Ser: 0.56 mg/dL — ABNORMAL LOW (ref 0.57–1.00)
GFR calc Af Amer: 150 mL/min/{1.73_m2}
GFR calc non Af Amer: 130 mL/min/{1.73_m2}
Globulin, Total: 2.9 g/dL (ref 1.5–4.5)
Glucose: 86 mg/dL (ref 65–99)
Potassium: 3.8 mmol/L (ref 3.5–5.2)
Sodium: 139 mmol/L (ref 134–144)
Total Protein: 6.4 g/dL (ref 6.0–8.5)

## 2020-06-07 LAB — GLUCOSE TOLERANCE, 2 HOURS W/ 1HR
Glucose, 1 hour: 140 mg/dL (ref 65–179)
Glucose, 2 hour: 116 mg/dL (ref 65–152)
Glucose, Fasting: 82 mg/dL (ref 65–91)

## 2020-06-07 LAB — ANTIBODY SCREEN: Antibody Screen: NEGATIVE

## 2020-06-07 LAB — BILE ACIDS, TOTAL: Bile Acids Total: 9.6 umol/L (ref 0.0–10.0)

## 2020-06-07 LAB — RPR: RPR Ser Ql: NONREACTIVE

## 2020-06-07 LAB — HIV ANTIBODY (ROUTINE TESTING W REFLEX): HIV Screen 4th Generation wRfx: NONREACTIVE

## 2020-06-10 ENCOUNTER — Telehealth: Payer: Self-pay | Admitting: *Deleted

## 2020-06-10 NOTE — Telephone Encounter (Signed)
Pt is itching all over. Pt had bile acids checked on 06/05/20. Dr. Despina Hidden wanted to have bile acids repeated in 2-3 weeks. Pt don't feel like she can wait that long. Itching is worse. Pt wants to know if she can go ahead and have labs repeated. Please advise. I spoke with Dr. Emelda Fear and he advised pt can go ahead and have bile acid repeated. Pt can't go tomorrow but plans on going Friday afternoon. I advised she has to be fasting. Pt states she is so sick with this pregnancy so it won't be an issue for her to be fasting. JSY

## 2020-06-11 DIAGNOSIS — F411 Generalized anxiety disorder: Secondary | ICD-10-CM | POA: Diagnosis not present

## 2020-06-11 LAB — CYTOLOGY - PAP
Adequacy: ABSENT
Comment: NEGATIVE
Comment: NEGATIVE
Diagnosis: NEGATIVE
HPV 16: NEGATIVE
HPV 18 / 45: NEGATIVE
High risk HPV: POSITIVE — AB

## 2020-06-12 ENCOUNTER — Other Ambulatory Visit: Payer: Self-pay

## 2020-06-12 ENCOUNTER — Other Ambulatory Visit: Payer: Medicaid Other

## 2020-06-12 DIAGNOSIS — L299 Pruritus, unspecified: Secondary | ICD-10-CM | POA: Diagnosis not present

## 2020-06-14 LAB — BILE ACIDS, TOTAL: Bile Acids Total: 7.4 umol/L (ref 0.0–10.0)

## 2020-06-18 DIAGNOSIS — F411 Generalized anxiety disorder: Secondary | ICD-10-CM | POA: Diagnosis not present

## 2020-06-24 ENCOUNTER — Other Ambulatory Visit: Payer: Self-pay | Admitting: Adult Health

## 2020-06-24 DIAGNOSIS — L299 Pruritus, unspecified: Secondary | ICD-10-CM

## 2020-06-24 NOTE — Progress Notes (Signed)
Check bile acids

## 2020-06-26 ENCOUNTER — Telehealth (INDEPENDENT_AMBULATORY_CARE_PROVIDER_SITE_OTHER): Payer: Medicaid Other | Admitting: Obstetrics & Gynecology

## 2020-06-26 ENCOUNTER — Encounter: Payer: Self-pay | Admitting: Obstetrics & Gynecology

## 2020-06-26 VITALS — BP 124/70 | HR 89

## 2020-06-26 DIAGNOSIS — L299 Pruritus, unspecified: Secondary | ICD-10-CM | POA: Diagnosis not present

## 2020-06-26 DIAGNOSIS — O99713 Diseases of the skin and subcutaneous tissue complicating pregnancy, third trimester: Secondary | ICD-10-CM

## 2020-06-26 DIAGNOSIS — F411 Generalized anxiety disorder: Secondary | ICD-10-CM | POA: Diagnosis not present

## 2020-06-26 DIAGNOSIS — Z3A29 29 weeks gestation of pregnancy: Secondary | ICD-10-CM

## 2020-06-26 DIAGNOSIS — O3433 Maternal care for cervical incompetence, third trimester: Secondary | ICD-10-CM

## 2020-06-26 DIAGNOSIS — O3432 Maternal care for cervical incompetence, second trimester: Secondary | ICD-10-CM

## 2020-06-26 DIAGNOSIS — O0990 Supervision of high risk pregnancy, unspecified, unspecified trimester: Secondary | ICD-10-CM

## 2020-06-26 NOTE — Progress Notes (Signed)
I connected with Tara Mcmahon 06/26/20 at 12:30 PM EDT by: MyChart video and verified that I am speaking with the correct person using two identifiers.  Patient is located at home and provider is located at office.     The purpose of this virtual visit is to provide medical care while limiting exposure to the novel coronavirus. I discussed the limitations, risks, security and privacy concerns of performing an evaluation and management service by MyChart video and the availability of in person appointments. I also discussed with the patient that there may be a patient responsible charge related to this service. By engaging in this virtual visit, you consent to the provision of healthcare.  Additionally, you authorize for your insurance to be billed for the services provided during this visit.  The patient expressed understanding and agreed to proceed.  The following staff members participated in the virtual visit:  Faith Rogue LPN    PRENATAL VISIT NOTE  Subjective:  Tara Mcmahon is a 24 y.o. [id number] at [redacted]w[redacted]d  for phone visit for ongoing prenatal care.  She is currently monitored for the following issues for this high-risk pregnancy and has Depression with anxiety; Family history of systemic lupus erythematosus (SLE) in mother; History of suicidal ideation; Subclinical hypothyroidism; History of PCOS; History of cervical incompetence in pregnancy, currently pregnant; History of preterm delivery, currently pregnant; Rubella non-immune status, antepartum; Encounter for supervision of other normal pregnancy, unspecified trimester; History of gestational hypertension; and Supervision of high risk pregnancy, antepartum on their problem list.  Patient reports itching.  Contractions: Irregular. Vag. Bleeding: None.  Movement: Present. Denies leaking of fluid.   The following portions of the patient's history were reviewed and updated as appropriate: allergies, current medications, past family  history, past medical history, past social history, past surgical history and problem list.   Objective:  There were no vitals filed for this visit. Self-Obtained  Fetal Status:     Movement: Present     Assessment and Plan:  Pregnancy: [id number] at [redacted]w[redacted]d There are no diagnoses linked to this encounter. Preterm labor symptoms and general obstetric precautions including but not limited to vaginal bleeding, contractions, leaking of fluid and fetal movement were reviewed in detail with the patient.  Return in about 2 weeks (around 07/10/2020) for HROB.  No future appointments.   Time spent on virtual visit: 10 minutes  Lazaro Arms, MD

## 2020-06-28 LAB — BILE ACIDS, TOTAL: Bile Acids Total: 6.3 umol/L (ref 0.0–10.0)

## 2020-07-14 ENCOUNTER — Telehealth (INDEPENDENT_AMBULATORY_CARE_PROVIDER_SITE_OTHER): Payer: Medicaid Other | Admitting: Obstetrics and Gynecology

## 2020-07-14 ENCOUNTER — Encounter: Payer: Self-pay | Admitting: Obstetrics and Gynecology

## 2020-07-14 VITALS — BP 129/80 | HR 100

## 2020-07-14 DIAGNOSIS — Z3480 Encounter for supervision of other normal pregnancy, unspecified trimester: Secondary | ICD-10-CM

## 2020-07-14 DIAGNOSIS — Z3A32 32 weeks gestation of pregnancy: Secondary | ICD-10-CM

## 2020-07-14 DIAGNOSIS — Z3482 Encounter for supervision of other normal pregnancy, second trimester: Secondary | ICD-10-CM

## 2020-07-14 DIAGNOSIS — O0990 Supervision of high risk pregnancy, unspecified, unspecified trimester: Secondary | ICD-10-CM

## 2020-07-14 NOTE — Progress Notes (Signed)
°  OB VISIT ENCOUNTER NOTE     I discussed the limitations, risks, security and privacy concerns of performing an evaluation and management service by telephone and the availability of in person appointments. I also discussed with the patient that there may be a patient responsible charge related to this service. The patient expressed understanding and agreed to proceed.   History:  Tara Mcmahon is a 24 y.o. [phone] female being evaluated today for a low-risk pregnancy. She is concerned about the amount of  vaginal discharge, bleeding, pelvic pain or other concerns.       Past Medical History:  Diagnosis Date   Anxiety    Cervical incompetence during pregnancy    Depression    Hypothyroidism    PCOS (polycystic ovarian syndrome)    Pregnancy induced hypertension    With second pregnancy   Past Surgical History:  Procedure Laterality Date   CERVICAL CERCLAGE     CERVICAL CERCLAGE N/A 03/09/2016   Procedure: [facility] CERCLAGE PLACEMENT;  Surgeon: Lazaro Arms, MD;  Location: [facility];  Service: Gynecology;  Laterality: N/A;   CERVICAL CERCLAGE N/A 01/31/2018   Procedure: CERCLAGE CERVICAL;  Surgeon: Lazaro Arms, MD;  Location: [facility];  Service: Gynecology;  Laterality: N/A;   CERVICAL CERCLAGE     Placed in 2013, 2017, and 2019   CERVICAL CERCLAGE N/A 03/13/2020   Procedure: CERCLAGE CERVICAL (Mcdonald);  Surgeon: Lazaro Arms, MD;  Location: [facility];  Service: Gynecology;  Laterality: N/A;   labial reduction    She has had a moderate vaginal d/c recently, and is concerned. The following portions of the patient's history were reviewed and updated as appropriate: allergies, current medications, past family history, past medical history, past social history, past surgical history and problem list.    Review of Systems:  Pertinent items noted in HPI and remainder of comprehensive ROS otherwise negative.   Physical Exam:   General:  Alert, oriented and cooperative.    Mental Status: Normal mood and affect perceived. Normal judgment and thought content.  Speculum exam" physiologic d/c. PH 4.5.   Labs and Imagingph 4.5 fern negative normal epithelials only.  No results found.    Assessment and Plan:    [id number] [redacted]w[redacted]d There are no diagnoses linked to this encounter.      I discussed the assessment and treatment plan with the patient. The patient was provided an opportunity to ask questions and all were answered. The patient agreed with the plan and demonstrated an understanding of the instructions.   The patient was advised to call back or seek an in-person evaluation/go to the ED if the symptoms worsen or if the condition fails to improve as anticipated.  Tilda Burrow, MD  [facility], [facility]   By signing my name below, I, [employer], attest that this documentation has been prepared under the direction and in the presence of Tilda Burrow, MD. Electronically Signed: Mal Misty Medical Scribe. 07/14/20. 1:53 PM.  I personally performed the services described in this documentation, which was SCRIBED in my presence. The recorded information has been reviewed and considered accurate. It has been edited as necessary during review. Tilda Burrow, MD

## 2020-07-16 NOTE — Progress Notes (Signed)
Patient ID: Tara Mcmahon, female   DOB: [birth year], 24 y.o.   MRN: [id number]    [facility] PREGNANCY VISIT Patient name: Tara Mcmahon MRN [id number]  Date of birth: 1996/11/06 Chief Complaint:   No chief complaint on file.  History of Present Illness:   Tara Mcmahon is a 24 y.o. [phone] female at [redacted]w[redacted]d with an Estimated Date of Delivery: 09/06/20 being seen today for ongoing management of a high-risk pregnancy complicated by Merselene cerclage.  Today she reports no complaints.  Depression screen [facility] 2/9 06/05/2020 02/25/2020 12/13/2017 06/23/2017  Decreased Interest 1 0 1 0  Down, Depressed, Hopeless 0 0 1 0  PHQ - 2 Score 1 0 2 0  Altered sleeping 1 - 0 -  Tired, decreased energy 1 1 1  -  Change in appetite 0 0 0 -  Feeling bad or failure about yourself  0 0 0 -  Trouble concentrating 0 0 0 -  Moving slowly or fidgety/restless 0 0 1 -  Suicidal thoughts 0 0 0 -  PHQ-9 Score 3 - 4 -  Difficult doing work/chores Not difficult at all Not difficult at all Not difficult at all -     .  .   . denies leaking of fluid.  Review of Systems:   Pertinent items are noted in HPI Denies abnormal vaginal discharge w/ itching/odor/irritation, headaches, visual changes, shortness of breath, chest pain, abdominal pain, severe nausea/vomiting, or problems with urination or bowel movements unless otherwise stated above. Pertinent History Reviewed:  Reviewed past medical,surgical, social, obstetrical and family history.  Reviewed problem list, medications and allergies. Physical Assessment:  There were no vitals filed for this visit.There is no height or weight on file to calculate BMI.           Physical Examination:   General appearance: alert, well appearing, and in no distress, oriented to person, place, and time and normal appearing weight  Mental status: alert, oriented to person, place, and time, normal mood, behavior, speech, dress, motor activity, and thought processes  Skin: warm &  dry   Extremities:      Cardiovascular: normal heart rate noted  Respiratory: normal respiratory effort, no distress  Abdomen: gravid, soft, non-tender  Pelvic: speculum normal secretions.         Fetal Status:          Fetal Surveillance Testing today: none  Chaperone: N/A  No results found for this or any previous visit (from the past 24 hour(s)).  Assessment & Plan:  1) High-risk pregnancy at [redacted]w[redacted]d with an Estimated Date of Delivery: 09/06/20   2) hx cervical incompetence with Merselene cerclage., stable  3)   Meds: No orders of the defined types were placed in this encounter.  Labs/procedures today: none  Treatment Plan:  Remove cerclage at 37 wk  Reviewed: Preterm labor symptoms and general obstetric precautions including but not limited to vaginal bleeding, contractions, leaking of fluid and fetal movement were reviewed in detail with the patient.  All questions were answered. has home bp cuff. . Check bp weekly, let 11/06/20 know if >140/90.   Follow-up: Return in about 2 weeks (around 07/31/2020) for HROB.  No orders of the defined types were placed in this encounter.  07/16/2020 11:26 AM  By signing my name below, I, 07/18/2020, attest that this documentation has been prepared under the direction and in the presence of Pietro Cassis, MD. Electronically Signed: Tilda Burrow, Medical Scribe. 07/16/20. 11:26 AM.  I personally performed the services described in this documentation, which was SCRIBED in my presence. The recorded information has been reviewed and considered accurate. It has been edited as necessary during review. Jonnie Kind, MD

## 2020-07-17 ENCOUNTER — Other Ambulatory Visit: Payer: Self-pay

## 2020-07-17 ENCOUNTER — Encounter: Payer: Self-pay | Admitting: Obstetrics and Gynecology

## 2020-07-17 ENCOUNTER — Ambulatory Visit (INDEPENDENT_AMBULATORY_CARE_PROVIDER_SITE_OTHER): Payer: Medicaid Other | Admitting: Obstetrics and Gynecology

## 2020-07-17 VITALS — BP 117/83 | HR 111 | Wt 204.0 lb

## 2020-07-17 DIAGNOSIS — O26893 Other specified pregnancy related conditions, third trimester: Secondary | ICD-10-CM

## 2020-07-17 DIAGNOSIS — Z1389 Encounter for screening for other disorder: Secondary | ICD-10-CM

## 2020-07-17 DIAGNOSIS — O0990 Supervision of high risk pregnancy, unspecified, unspecified trimester: Secondary | ICD-10-CM

## 2020-07-17 DIAGNOSIS — Z3A32 32 weeks gestation of pregnancy: Secondary | ICD-10-CM

## 2020-07-17 DIAGNOSIS — Z331 Pregnant state, incidental: Secondary | ICD-10-CM | POA: Diagnosis not present

## 2020-07-17 DIAGNOSIS — N898 Other specified noninflammatory disorders of vagina: Secondary | ICD-10-CM | POA: Diagnosis not present

## 2020-07-17 LAB — POCT URINALYSIS DIPSTICK OB
Blood, UA: NEGATIVE
Glucose, UA: NEGATIVE
Ketones, UA: NEGATIVE
Leukocytes, UA: NEGATIVE
Nitrite, UA: NEGATIVE

## 2020-07-17 LAB — POCT FERNING: Ferning, POC: NEGATIVE

## 2020-07-17 NOTE — Progress Notes (Signed)
Thin, watery discharge x 1-2 weeks.

## 2020-07-24 ENCOUNTER — Other Ambulatory Visit: Payer: Self-pay | Admitting: Obstetrics and Gynecology

## 2020-07-24 DIAGNOSIS — O26843 Uterine size-date discrepancy, third trimester: Secondary | ICD-10-CM

## 2020-07-27 ENCOUNTER — Encounter: Payer: Medicaid Other | Admitting: Obstetrics and Gynecology

## 2020-07-27 ENCOUNTER — Other Ambulatory Visit: Payer: Self-pay | Admitting: Obstetrics and Gynecology

## 2020-07-27 ENCOUNTER — Ambulatory Visit (INDEPENDENT_AMBULATORY_CARE_PROVIDER_SITE_OTHER): Payer: Medicaid Other

## 2020-07-27 DIAGNOSIS — O09299 Supervision of pregnancy with other poor reproductive or obstetric history, unspecified trimester: Secondary | ICD-10-CM

## 2020-07-27 DIAGNOSIS — O26843 Uterine size-date discrepancy, third trimester: Secondary | ICD-10-CM

## 2020-07-27 NOTE — Progress Notes (Signed)
[place] 34+1 wks,cephalic,anterior placenta gr 1,afi 20 cm,fhr 146 bpm,EFW 2605 g 73%

## 2020-07-31 ENCOUNTER — Telehealth (INDEPENDENT_AMBULATORY_CARE_PROVIDER_SITE_OTHER): Payer: Medicaid Other | Admitting: Obstetrics & Gynecology

## 2020-07-31 ENCOUNTER — Encounter: Payer: Self-pay | Admitting: Obstetrics & Gynecology

## 2020-07-31 VITALS — BP 131/71

## 2020-07-31 DIAGNOSIS — O0990 Supervision of high risk pregnancy, unspecified, unspecified trimester: Secondary | ICD-10-CM

## 2020-07-31 DIAGNOSIS — O3432 Maternal care for cervical incompetence, second trimester: Secondary | ICD-10-CM

## 2020-07-31 DIAGNOSIS — Z3480 Encounter for supervision of other normal pregnancy, unspecified trimester: Secondary | ICD-10-CM

## 2020-07-31 NOTE — Progress Notes (Signed)
   HIGH-RISK PREGNANCY VISIT: Telephone visit Patient name: Tara Mcmahon MRN [id number]  Date of birth: 10-Mar-1996 Chief Complaint:   Routine Prenatal Visit (headaches/floaters for the past week)  History of Present Illness:   Tara Mcmahon is a 24 y.o. [phone] female at [redacted]w[redacted]d with an Estimated Date of Delivery: 09/06/20 being seen today for ongoing management of a high-risk pregnancy complicated by Cervical incompetency.  Today she reports no complaints.  Depression screen [facility] 2/9 06/05/2020 02/25/2020 12/13/2017 06/23/2017  Decreased Interest 1 0 1 0  Down, Depressed, Hopeless 0 0 1 0  PHQ - 2 Score 1 0 2 0  Altered sleeping 1 - 0 -  Tired, decreased energy 1 1 1  -  Change in appetite 0 0 0 -  Feeling bad or failure about yourself  0 0 0 -  Trouble concentrating 0 0 0 -  Moving slowly or fidgety/restless 0 0 1 -  Suicidal thoughts 0 0 0 -  PHQ-9 Score 3 - 4 -  Difficult doing work/chores Not difficult at all Not difficult at all Not difficult at all -    Contractions: Irregular. Vag. Bleeding: None.  Movement: Present. denies leaking of fluid.  Review of Systems:   Pertinent items are noted in HPI Denies abnormal vaginal discharge w/ itching/odor/irritation, headaches, visual changes, shortness of breath, chest pain, abdominal pain, severe nausea/vomiting, or problems with urination or bowel movements unless otherwise stated above. Pertinent History Reviewed:  Reviewed past medical,surgical, social, obstetrical and family history.  Reviewed problem list, medications and allergies. Physical Assessment:   Vitals:   07/31/20 1220  BP: 131/71  There is no height or weight on file to calculate BMI.           Physical Examination:   General appearance: alert, well appearing, and in no distress  Mental status: alert, oriented to person, place, and time  Skin: warm & dry   Extremities: Edema: None    Cardiovascular: normal heart rate noted  Respiratory: normal respiratory  effort, no distress  Abdomen: gravid, soft, non-tender  Pelvic: Cervical exam deferred         Fetal Status:     Movement: Present    Fetal Surveillance Testing today:    Chaperone: n/a    No results found for this or any previous visit (from the past 24 hour(s)).  Assessment & Plan:  1) High-risk pregnancy 09/30/20 at [redacted]w[redacted]d with an Estimated Date of Delivery: 09/06/20   2) McDonald cerclage in place, stable  3) Itching has been variable improved over previously,   Meds: No orders of the defined types were placed in this encounter.   Labs/procedures today:   Treatment Plan:  2 weeks for cerclage removal cultures  Reviewed: Preterm labor symptoms and general obstetric precautions including but not limited to vaginal bleeding, contractions, leaking of fluid and fetal movement were reviewed in detail with the patient.  All questions were answered. Has home bp cuff. Rx faxed to . Check bp weekly, let 11/06/20 know if >140/90.   Follow-up: Return in about 2 weeks (around 08/14/2020) for HROB, cerclage removal.  No orders of the defined types were placed in this encounter.  08/16/2020  07/31/2020 12:42 PM

## 2020-08-14 ENCOUNTER — Inpatient Hospital Stay (HOSPITAL_COMMUNITY)
Admission: AD | Admit: 2020-08-14 | Discharge: 2020-08-14 | Disposition: A | Discharge status: Home / Self Care | Payer: Medicaid Other | Attending: Obstetrics and Gynecology | Admitting: Obstetrics and Gynecology

## 2020-08-14 ENCOUNTER — Encounter: Payer: Self-pay | Admitting: Obstetrics & Gynecology

## 2020-08-14 ENCOUNTER — Other Ambulatory Visit (HOSPITAL_COMMUNITY)
Admission: RE | Admit: 2020-08-14 | Discharge: 2020-08-14 | Disposition: A | Discharge status: Home / Self Care | Payer: Medicaid Other | Source: Ambulatory Visit | Attending: Obstetrics & Gynecology | Admitting: Obstetrics & Gynecology

## 2020-08-14 ENCOUNTER — Encounter (HOSPITAL_COMMUNITY): Payer: Self-pay | Admitting: Obstetrics and Gynecology

## 2020-08-14 ENCOUNTER — Other Ambulatory Visit: Payer: Self-pay

## 2020-08-14 ENCOUNTER — Ambulatory Visit (INDEPENDENT_AMBULATORY_CARE_PROVIDER_SITE_OTHER): Payer: Medicaid Other | Admitting: Obstetrics & Gynecology

## 2020-08-14 VITALS — BP 126/80 | HR 99 | Wt 207.0 lb

## 2020-08-14 DIAGNOSIS — Z3A36 36 weeks gestation of pregnancy: Secondary | ICD-10-CM | POA: Insufficient documentation

## 2020-08-14 DIAGNOSIS — O4693 Antepartum hemorrhage, unspecified, third trimester: Secondary | ICD-10-CM

## 2020-08-14 DIAGNOSIS — O26853 Spotting complicating pregnancy, third trimester: Secondary | ICD-10-CM | POA: Diagnosis not present

## 2020-08-14 DIAGNOSIS — L299 Pruritus, unspecified: Secondary | ICD-10-CM | POA: Diagnosis not present

## 2020-08-14 DIAGNOSIS — Z7982 Long term (current) use of aspirin: Secondary | ICD-10-CM | POA: Insufficient documentation

## 2020-08-14 DIAGNOSIS — Z331 Pregnant state, incidental: Secondary | ICD-10-CM

## 2020-08-14 DIAGNOSIS — E039 Hypothyroidism, unspecified: Secondary | ICD-10-CM | POA: Diagnosis not present

## 2020-08-14 DIAGNOSIS — Z3480 Encounter for supervision of other normal pregnancy, unspecified trimester: Secondary | ICD-10-CM

## 2020-08-14 DIAGNOSIS — N858 Other specified noninflammatory disorders of uterus: Secondary | ICD-10-CM

## 2020-08-14 DIAGNOSIS — Z3483 Encounter for supervision of other normal pregnancy, third trimester: Secondary | ICD-10-CM | POA: Insufficient documentation

## 2020-08-14 DIAGNOSIS — O3433 Maternal care for cervical incompetence, third trimester: Secondary | ICD-10-CM | POA: Diagnosis not present

## 2020-08-14 DIAGNOSIS — O99283 Endocrine, nutritional and metabolic diseases complicating pregnancy, third trimester: Secondary | ICD-10-CM | POA: Diagnosis not present

## 2020-08-14 DIAGNOSIS — E282 Polycystic ovarian syndrome: Secondary | ICD-10-CM | POA: Diagnosis not present

## 2020-08-14 DIAGNOSIS — Z1389 Encounter for screening for other disorder: Secondary | ICD-10-CM

## 2020-08-14 NOTE — MAU Note (Signed)
Pt still in BR trying to get urine. STates " I think baby is sitting on my bladder"

## 2020-08-14 NOTE — MAU Provider Note (Signed)
Chief Complaint:  Vaginal Bleeding and Decreased Fetal Movement   First Provider Initiated Contact with Patient 08/14/20 0055     HPI: Tara Mcmahon is a 24 y.o. [id number] at 79w5dwho presents to maternity admissions reporting pelvic pressure and spotting tonight.  Is scheduled to have Cerclage removed at noon today.  Has some pelvic pain at times.. She reports good fetal movement, denies LOF, vaginal bleeding, vaginal itching/burning, urinary symptoms, h/a, dizziness, n/v, diarrhea, constipation or fever/chills.  She denies headache, visual changes or RUQ abdominal pain.  Vaginal Bleeding The patient's primary symptoms include pelvic pain and vaginal bleeding. The patient's pertinent negatives include no genital itching, genital lesions or genital odor. This is a new problem. The current episode started today. The problem occurs intermittently. The pain is mild. She is pregnant. Pertinent negatives include no chills, constipation, diarrhea, dysuria, fever, frequency, nausea or vomiting. The vaginal discharge was bloody. The vaginal bleeding is spotting. She has not been passing clots. She has not been passing tissue. Nothing aggravates the symptoms. She has tried nothing for the symptoms.    RN Note: Pt has cerclage in place which is to be removed later this am at office visit. Feeling a lot of pelvic pressure tonight and spotting so was advised to come in to be seen. Some pelvic pain  Past Medical History: Past Medical History:  Diagnosis Date  . Anxiety   . Cervical incompetence during pregnancy   . Depression   . Hypothyroidism   . PCOS (polycystic ovarian syndrome)   . Pregnancy induced hypertension    With second pregnancy    Past obstetric history: OB History  Gravida Para Term Preterm AB Living  5 3 2 1 1 3   SAB TAB Ectopic Multiple Live Births  1     0 3    # Outcome Date GA Lbr Len/2nd Weight Sex Delivery Anes PTL Lv  5 Current           4 Term 07/19/18 [redacted]w[redacted]d 16:23 /  04:43 3630 g F Vag-Spont EPI  LIV     Birth Comments: WNL     Complications: Pre-eclampsia  3 Term 08/30/16 [redacted]w[redacted]d 07:05 / 03:13 2695 g F Vag-Spont EPI Y LIV     Complications: History of cervical cerclage, Gestational hypertension  2 Preterm 09/19/12 [redacted]w[redacted]d  822 g F Vag-Spont None Y LIV     Complications: Cervical incompetence, History of preterm premature rupture of membranes (PPROM)  1 SAB 2013            Past Surgical History: Past Surgical History:  Procedure Laterality Date  . CERVICAL CERCLAGE    . CERVICAL CERCLAGE N/A 03/09/2016   Procedure: [facility] CERCLAGE PLACEMENT;  Surgeon: [facility], MD;  Location: [facility];  Service: Gynecology;  Laterality: N/A;  . CERVICAL CERCLAGE N/A 01/31/2018   Procedure: CERCLAGE CERVICAL;  Surgeon: 04/02/2018, MD;  Location: [facility];  Service: Gynecology;  Laterality: N/A;  . CERVICAL CERCLAGE     Placed in 2013, 2017, and 2019  . CERVICAL CERCLAGE N/A 03/13/2020   Procedure: CERCLAGE CERVICAL ([place]);  Surgeon: 03/15/2020, MD;  Location: [facility];  Service: Gynecology;  Laterality: N/A;  . labial reduction      Family History: Family History  Problem Relation Age of Onset  . Arthritis Mother   . Lupus Mother   . Autoimmune disease Mother   . Crohn's disease Mother   . Cancer Maternal Grandmother  breast  . Heart disease Maternal Grandfather   . Heart disease Paternal Grandfather   . Hypertension Paternal Grandfather   . Lupus Maternal Aunt     Social History: Social History   Tobacco Use  . Smoking status: Never Smoker  . Smokeless tobacco: Never Used  Vaping Use  . Vaping Use: Never assessed  Substance Use Topics  . Alcohol use: Not Currently    Comment: occ; not now  . Drug use: No    Allergies:  Allergies  Allergen Reactions  . Shellfish Allergy Anaphylaxis and Swelling    Only Crawfish not allergic to any other shellfish     Meds:  Medications Prior to Admission  Medication Sig Dispense Refill Last  Dose  . docusate sodium (COLACE) 100 MG capsule Take 1 capsule (100 mg total) by mouth 2 (two) times daily. (Patient taking differently: Take 100 mg by mouth. ) 60 capsule 0 08/13/2020 at Unknown time  . Prenatal Vit-Fe Fumarate-FA (MULTIVITAMIN-PRENATAL) 27-0.8 MG TABS tablet Take 1 tablet by mouth daily at 12 noon.   08/13/2020 at Unknown time  . aspirin EC 81 MG tablet Take 81 mg by mouth in the morning and at bedtime.     . Blood Pressure Monitor MISC For regular home bp monitoring during pregnancy 1 each 0     I have reviewed patient's Past Medical Hx, Surgical Hx, Family Hx, Social Hx, medications and allergies.   ROS:  Review of Systems  Constitutional: Negative for chills and fever.  Gastrointestinal: Negative for constipation, diarrhea, nausea and vomiting.  Genitourinary: Positive for pelvic pain and vaginal bleeding. Negative for dysuria and frequency.   Other systems negative  Physical Exam   Patient Vitals for the past 24 hrs:  Temp Resp Height Weight  08/14/20 0020 98.4 F (36.9 C) 18 4\' 11"  (1.499 m) 93.4 kg   Constitutional: Well-developed, well-nourished female in no acute distress.  Cardiovascular: normal rate and rhythm Respiratory: normal effort, clear to auscultation bilaterally GI: Abd soft, non-tender, gravid appropriate for gestational age.   No rebound or guarding. MS: Extremities nontender, no edema, normal ROM Neurologic: Alert and oriented x 4.  GU: Neg CVAT.  PELVIC EXAM: Cervix pink, visually closed, without lesion, scant white creamy discharge with small streak of dark red blood, Cerclage present.    Dilation: Fingertip Effacement (%): 40 Station: Ballotable Presentation: Vertex (Vtx confirmed by bedside u/s by CNM) Exam by:: [email or web address] CNM  FHT:  Baseline 140 , moderate variability, accelerations present, no decelerations Contractions: uterine irritability   Labs: No results found for this or any previous visit (from the past  24 hour(s)).  O/Positive/-- (03/30 1720)  Imaging:  Pt informed that the ultrasound is considered a limited OB ultrasound and is not intended to be a complete ultrasound exam.  Patient also informed that the ultrasound is not being completed with the intent of assessing for fetal or placental anomalies or any pelvic abnormalities.  Explained that the purpose of today's ultrasound is to assess for presentation.  Patient acknowledges the purpose of the exam and the limitations of the study.    Vertex presentation  MAU Course/MDM: NST reviewed, reactive with uterine irritability.  Contractions are mild and don't last long  Advised to increase PO water intake.  Has appt at noon today to have cerclage removed. .    Assessment: Single IUP at [redacted]w[redacted]d Cerclage present Uterine irritability Spotting in setting of cerclage, likely related to uterine irritability  Plan: Discharge home Labor  precautions and fetal kick counts Follow up in Office for prenatal visits and recheck of cerclage  Encouraged to return here or to other Urgent Care/ED if she develops worsening of symptoms, increase in pain, fever, or other concerning symptoms.   Pt stable at time of discharge.  Wynelle Bourgeois CNM, MSN Certified Nurse-Midwife 08/14/2020 12:55 AM

## 2020-08-14 NOTE — Progress Notes (Signed)
Written and verbal d/c instructions given and understanding voiced. 

## 2020-08-14 NOTE — MAU Note (Signed)
PT still unable to void for u/a. Ginger ale to pt for nausea

## 2020-08-14 NOTE — Progress Notes (Signed)
   LOW-RISK PREGNANCY VISIT Patient name: Tara Mcmahon MRN [id number]  Date of birth: Mar 18, 1996 Chief Complaint:   Routine Prenatal Visit  History of Present Illness:   Tara Mcmahon is a 24 y.o. [phone] female at 50w5dwith an Estimated Date of Delivery: 09/06/20 being seen today for ongoing management of a low-risk pregnancy.  [facility] 06/05/2020 02/25/2020 12/13/2017 06/23/2017  Decreased Interest 1 0 1 0  Down, Depressed, Hopeless 0 0 1 0  PHQ - 2 Score 1 0 2 0  Altered sleeping 1 - 0 -  Tired, decreased energy _0 -  Change in appetite 0 0 0 -  Feeling bad or failure about yourself  0 0 0 -  Trouble concentrating 0 0 0 -  Moving slowly or fidgety/restless 0 0 1 -  Suicidal thoughts 0 0 0 -  PHQ-9 Score 3 - 4 -  Difficult doing work/chores Not difficult at all Not difficult at all Not difficult at all -    Today she reports pelvic pain. Contractions: Irregular. Vag. Bleeding: None.  Movement: Present. denies leaking of fluid. Review of Systems:   Pertinent items are noted in HPI Denies abnormal vaginal discharge w/ itching/odor/irritation, headaches, visual changes, shortness of breath, chest pain, abdominal pain, severe nausea/vomiting, or problems with urination or bowel movements unless otherwise stated above. Pertinent History Reviewed:  Reviewed past medical,surgical, social, obstetrical and family history.  Reviewed problem list, medications and allergies. Physical Assessment:   Vitals:   08/14/20 1213  BP: 126/80  Pulse: 99  Weight: 207 lb (93.9 kg)  Body mass index is 41.81 kg/m.        Physical Examination:   General appearance: Well appearing, and in no distress  Mental status: Alert, oriented to person, place, and time  Skin: Warm & dry  Cardiovascular: Normal heart rate noted  Respiratory: Normal respiratory effort, no distress  Abdomen: Soft, gravid, nontender  Pelvic: Cervical exam deferred         Extremities: Edema:  None  Fetal Status: Fetal Heart Rate (bpm): 130 Fundal Height: 38 cm Movement: Present    Chaperone: Amanda Rash    No results found for this or any previous visit (from the past 24 hour(s)).  Assessment & Plan:  1) Low-risk pregnancy [id number] 362w5dith an Estimated Date of Delivery: 09/06/20   2) Cerclage removed today without problem, cx 2/th/ball   Meds: No orders of the defined types were placed in this encounter.  Labs/procedures today: CMP for itching  Plan:  Continue routine obstetrical care  Next visit: prefers in person    Reviewed: Term labor symptoms and general obstetric precautions including but not limited to vaginal bleeding, contractions, leaking of fluid and fetal movement were reviewed in detail with the patient.  All questions were answered.  home bp cuff. Rx faxed to . Check bp weekly, let usKoreanow if >140/90.   Follow-up: Return in about 1 week (around 08/21/2020) for LRAlbia Orders Placed This Encounter  Procedures  . Culture, beta strep (group b only)  . Comp Met (CMET)   LuFlorian BuffNM, [facility]/17/2021 12:33 PM

## 2020-08-14 NOTE — Discharge Instructions (Signed)
Cervical Cerclage  Cervical cerclage is a surgical procedure to correct a cervix that opens up and thins out before pregnancy is at term. This is also called cervical insufficiency, or incompetent cervix. This condition can cause labor to start early (prematurely). In this procedure, a health care provider uses stitches (sutures) to sew the cervix shut during pregnancy. Your health care provider may use ultrasound to help guide the procedure and monitor your baby. Ultrasound uses sound waves to take images of your cervix and uterus. The health care provider will assess these images on a monitor in the operating room. Tell a health care provider about:  Any allergies you have, especially any allergies related to prescribed medicine, stitches, or anesthetic medicines.  Any problems you or family members have had with anesthetic medicines.  Any blood disorders you have.  Any surgeries you have had, including prior cervical stitching.  Any medical conditions you have or have had. What are the risks? Generally, this is a safe procedure. However, problems may occur, including:  Infection, such as infection of the cervix or the bag of fluid that surrounds the baby (amniotic sac).  Vaginal bleeding.  Allergic reactions to medicines.  Damage to nearby structures or organs, such as injury to the cervix or tearing of the amniotic sac.  Contractions that come too early, including going into early labor and delivery.  Cervical dystocia. This occurs when the cervix is unable to open normally during labor. What happens before the procedure? Staying hydrated Follow instructions from your health care provider about hydration, which may include:  Up to 2 hours before the procedure - you may continue to drink clear liquids, such as water, clear fruit juice, black coffee, and plain tea.  Eating and drinking restrictions Follow instructions from your health care provider about eating and drinking,  which may include:  8 hours before the procedure - stop eating heavy meals or foods, such as meat, fried foods, or fatty foods.  6 hours before the procedure - stop eating light meals or foods, such as toast or cereal.  6 hours before the procedure - stop drinking milk or drinks that contain milk.  2 hours before the procedure - stop drinking clear liquids. Medicines Ask your health care provider about:  Changing or stopping your regular medicines. This is especially important if you are taking diabetes medicines or blood thinners.  Taking medicines such as aspirin and ibuprofen. These medicines can thin your blood. Do not take these medicines unless your health care provider tells you to take them.  Taking over-the-counter medicines, vitamins, herbs, and supplements. Surgery safety Ask your health care provider:  How your surgery site will be marked.  What steps will be taken to help prevent infection. These may include: ? Removing hair at the surgery site. ? Washing skin with a germ-killing soap. ? Taking antibiotic medicine. General instructions  Do not put on any lotion, deodorant, or perfume.  Remove contact lenses and jewelry.  You may have an exam or testing, including blood or urine tests.  Plan to have someone take you home from the hospital or clinic.  If you will be going home right after the procedure, plan to have someone with you for 24 hours. What happens during the procedure?  An IV will be inserted into one of your veins.  You may be given one or more of the following: ? A medicine to help you relax (sedative). ? A medicine to numb the area (local anesthetic). ?   A medicine to make you fall asleep (general anesthetic). ? A medicine that is injected into your spine to numb the area below and slightly above the injection site (spinal anesthetic).  A lubricated instrument (speculum) will be inserted into your vagina. The speculum will be widened to open the  walls of your vagina so your surgeon can see your cervix.  Your cervix will be grasped and tightly sutured to close it. To do this, your surgeon will stitch a strong band of thread around your cervix, then the thread will be tightened to hold your cervix shut. The procedure may vary among health care providers and hospitals. What happens after the procedure?  Your blood pressure, heart rate, breathing rate, and blood oxygen level will be monitored until you leave the hospital or clinic.  You will be monitored for premature contractions.  You may have light bleeding and mild cramping.  You may have to wear compression stockings. These stockings help to prevent blood clots and reduce swelling in your legs.  If you were given a sedative during the procedure, it can affect you for several hours. Do not drive or operate machinery until your health care provider says that it is safe.  You may be put on bed rest.  You may be given an injection of a hormone (progesterone) to prevent premature contractions. Summary  Cervical cerclage is a surgical procedure in which stitches are used to sew the cervix shut during pregnancy.  Before the procedure, tell your health care provider about your medicines, or medical problems or blood disorders that you have.  This is a safe procedure. However, problems may occur, including infection, bleeding, or premature labor.  Follow all instructions about eating and drinking before the procedure. Plan to have someone drive you home from the hospital or clinic. This information is not intended to replace advice given to you by your health care provider. Make sure you discuss any questions you have with your health care provider. Document Revised: 09/10/2019 Document Reviewed: 07/10/2019 Elsevier Patient Education  2020 [employer]  

## 2020-08-14 NOTE — MAU Note (Signed)
Pt has cerclage in place which is to be removed later this am at office visit. Feeling a lot of pelvic pressure tonight and spotting so was advised to come in to be seen. Some pelvic pain.

## 2020-08-14 NOTE — MAU Note (Signed)
PT now states the baby has "been sluggish today and has not been herself". When asked to clarify pt states baby has not moved as much as usual today

## 2020-08-15 LAB — COMPREHENSIVE METABOLIC PANEL
ALT: 9 IU/L (ref 0–32)
AST: 9 IU/L (ref 0–40)
Albumin/Globulin Ratio: 1.1 — ABNORMAL LOW (ref 1.2–2.2)
Albumin: 3.2 g/dL — ABNORMAL LOW (ref 3.9–5.0)
Alkaline Phosphatase: 166 IU/L — ABNORMAL HIGH (ref 44–121)
BUN/Creatinine Ratio: 11 (ref 9–23)
BUN: 7 mg/dL (ref 6–20)
Bilirubin Total: 0.2 mg/dL (ref 0.0–1.2)
CO2: 19 mmol/L — ABNORMAL LOW (ref 20–29)
Calcium: 9.3 mg/dL (ref 8.7–10.2)
Chloride: 106 mmol/L (ref 96–106)
Creatinine, Ser: 0.64 mg/dL (ref 0.57–1.00)
GFR calc Af Amer: 142 mL/min/{1.73_m2} (ref >59)
GFR calc non Af Amer: 124 mL/min/{1.73_m2} (ref >59)
Globulin, Total: 3 g/dL (ref 1.5–4.5)
Glucose: 122 mg/dL — ABNORMAL HIGH (ref 65–99)
Potassium: 4 mmol/L (ref 3.5–5.2)
Sodium: 139 mmol/L (ref 134–144)
Total Protein: 6.2 g/dL (ref 6.0–8.5)

## 2020-08-17 LAB — CERVICOVAGINAL ANCILLARY ONLY
Chlamydia: NEGATIVE
Comment: NEGATIVE
Comment: NORMAL
Neisseria Gonorrhea: NEGATIVE

## 2020-08-17 LAB — CULTURE, BETA STREP (GROUP B ONLY): Strep Gp B Culture: POSITIVE — AB

## 2020-08-19 ENCOUNTER — Encounter (HOSPITAL_COMMUNITY): Payer: Self-pay | Admitting: Obstetrics and Gynecology

## 2020-08-19 ENCOUNTER — Other Ambulatory Visit: Payer: Self-pay

## 2020-08-19 ENCOUNTER — Inpatient Hospital Stay (EMERGENCY_DEPARTMENT_HOSPITAL)
Admission: AD | Admit: 2020-08-19 | Discharge: 2020-08-20 | Disposition: A | Discharge status: Home / Self Care | Payer: Medicaid Other | Source: Home / Self Care | Attending: Obstetrics and Gynecology | Admitting: Obstetrics and Gynecology

## 2020-08-19 DIAGNOSIS — O26893 Other specified pregnancy related conditions, third trimester: Secondary | ICD-10-CM | POA: Insufficient documentation

## 2020-08-19 DIAGNOSIS — Z7982 Long term (current) use of aspirin: Secondary | ICD-10-CM | POA: Insufficient documentation

## 2020-08-19 DIAGNOSIS — O99283 Endocrine, nutritional and metabolic diseases complicating pregnancy, third trimester: Secondary | ICD-10-CM | POA: Insufficient documentation

## 2020-08-19 DIAGNOSIS — I10 Essential (primary) hypertension: Secondary | ICD-10-CM

## 2020-08-19 DIAGNOSIS — Z79899 Other long term (current) drug therapy: Secondary | ICD-10-CM | POA: Insufficient documentation

## 2020-08-19 DIAGNOSIS — Z8759 Personal history of other complications of pregnancy, childbirth and the puerperium: Secondary | ICD-10-CM | POA: Insufficient documentation

## 2020-08-19 DIAGNOSIS — R519 Headache, unspecified: Secondary | ICD-10-CM | POA: Insufficient documentation

## 2020-08-19 DIAGNOSIS — O09293 Supervision of pregnancy with other poor reproductive or obstetric history, third trimester: Secondary | ICD-10-CM | POA: Insufficient documentation

## 2020-08-19 DIAGNOSIS — Z3480 Encounter for supervision of other normal pregnancy, unspecified trimester: Secondary | ICD-10-CM

## 2020-08-19 DIAGNOSIS — O368130 Decreased fetal movements, third trimester, not applicable or unspecified: Secondary | ICD-10-CM | POA: Insufficient documentation

## 2020-08-19 DIAGNOSIS — E039 Hypothyroidism, unspecified: Secondary | ICD-10-CM | POA: Insufficient documentation

## 2020-08-19 DIAGNOSIS — E282 Polycystic ovarian syndrome: Secondary | ICD-10-CM | POA: Insufficient documentation

## 2020-08-19 DIAGNOSIS — O10913 Unspecified pre-existing hypertension complicating pregnancy, third trimester: Secondary | ICD-10-CM | POA: Insufficient documentation

## 2020-08-19 DIAGNOSIS — Z3A37 37 weeks gestation of pregnancy: Secondary | ICD-10-CM | POA: Insufficient documentation

## 2020-08-19 LAB — URINALYSIS, ROUTINE W REFLEX MICROSCOPIC
Bilirubin Urine: NEGATIVE
Glucose, UA: NEGATIVE mg/dL
Hgb urine dipstick: NEGATIVE
Ketones, ur: NEGATIVE mg/dL
Leukocytes,Ua: NEGATIVE
Nitrite: NEGATIVE
Protein, ur: 30 mg/dL — AB
Specific Gravity, Urine: 1.021 (ref 1.005–1.030)
pH: 6 (ref 5.0–8.0)

## 2020-08-19 LAB — PROTEIN / CREATININE RATIO, URINE
Creatinine, Urine: 220.39 mg/dL
Protein Creatinine Ratio: 0.11 mg/mg{Cre} (ref 0.00–0.15)
Total Protein, Urine: 25 mg/dL

## 2020-08-19 LAB — CBC
HCT: 33.4 % — ABNORMAL LOW (ref 36.0–46.0)
Hemoglobin: 11 g/dL — ABNORMAL LOW (ref 12.0–15.0)
MCH: 27.2 pg (ref 26.0–34.0)
MCHC: 32.9 g/dL (ref 30.0–36.0)
MCV: 82.5 fL (ref 80.0–100.0)
Platelets: 346 10*3/uL (ref 150–400)
RBC: 4.05 MIL/uL (ref 3.87–5.11)
RDW: 13 % (ref 11.5–15.5)
WBC: 9.3 10*3/uL (ref 4.0–10.5)
nRBC: 0 % (ref 0.0–0.2)

## 2020-08-19 LAB — COMPREHENSIVE METABOLIC PANEL
ALT: 14 U/L (ref 0–44)
AST: 18 U/L (ref 15–41)
Albumin: 2.5 g/dL — ABNORMAL LOW (ref 3.5–5.0)
Alkaline Phosphatase: 141 U/L — ABNORMAL HIGH (ref 38–126)
Anion gap: 9 (ref 5–15)
BUN: 5 mg/dL — ABNORMAL LOW (ref 6–20)
CO2: 20 mmol/L — ABNORMAL LOW (ref 22–32)
Calcium: 8.8 mg/dL — ABNORMAL LOW (ref 8.9–10.3)
Chloride: 105 mmol/L (ref 98–111)
Creatinine, Ser: 0.63 mg/dL (ref 0.44–1.00)
GFR calc Af Amer: >60 mL/min (ref >60)
GFR calc non Af Amer: >60 mL/min (ref >60)
Glucose, Bld: 89 mg/dL (ref 70–99)
Potassium: 4.2 mmol/L (ref 3.5–5.1)
Sodium: 134 mmol/L — ABNORMAL LOW (ref 135–145)
Total Bilirubin: 0.5 mg/dL (ref 0.3–1.2)
Total Protein: 6.5 g/dL (ref 6.5–8.1)

## 2020-08-19 MED ORDER — ACETAMINOPHEN 325 MG PO TABS
650.0000 mg | ORAL_TABLET | Freq: Once | ORAL | Status: AC
  Administered 2020-08-19: 650 mg via ORAL
  Filled 2020-08-19: qty 2

## 2020-08-19 MED ORDER — DIPHENHYDRAMINE HCL 25 MG PO CAPS
25.0000 mg | ORAL_CAPSULE | Freq: Once | ORAL | Status: AC
  Administered 2020-08-19: 25 mg via ORAL
  Filled 2020-08-19 (×2): qty 1

## 2020-08-19 MED ORDER — FENTANYL CITRATE (PF) 100 MCG/2ML IJ SOLN
50.0000 ug | Freq: Once | INTRAMUSCULAR | Status: AC
  Administered 2020-08-19: 50 ug via INTRAMUSCULAR
  Filled 2020-08-19: qty 2

## 2020-08-19 MED ORDER — FENTANYL CITRATE (PF) 100 MCG/2ML IJ SOLN: 25.0000 ug | Freq: Once | INTRAMUSCULAR | Status: DC

## 2020-08-19 MED ORDER — DEXAMETHASONE SODIUM PHOSPHATE 10 MG/ML IJ SOLN
10.0000 mg | Freq: Once | INTRAMUSCULAR | Status: DC
  Filled 2020-08-19: qty 1

## 2020-08-19 MED ORDER — METOCLOPRAMIDE HCL 5 MG/ML IJ SOLN
5.0000 mg | Freq: Once | INTRAMUSCULAR | Status: DC
  Filled 2020-08-19: qty 2

## 2020-08-19 NOTE — MAU Note (Addendum)
PT came in for BP check w/severe headaches. Patient was crying when nurse came in to check on her. Provider notified that headache was 10/10. 650 Tylenol ordered. Headache did not resolve w/Tylenol so 50 mcg Fentanyl IM given along with 25 mg PO Benadryl. Patient stated only slight relief of headache to 8/10. BP at last check was 117/74. Provider notified.

## 2020-08-19 NOTE — MAU Note (Signed)
Pt reporting severe headache which began at 6 pm, BP 142/96 and blurry/spots in vision, and increased swelling, denies URQ pain. Denies regular contractions, VB, LOF, some decreased fetal movement. Took an 81mg  aspirin for pain but it did not help. Hx of Preeclampsia.

## 2020-08-19 NOTE — MAU Provider Note (Addendum)
Chief Complaint:  Hypertension and Headache   First Provider Initiated Contact with Patient 08/19/20 2052     HPI: Tara Mcmahon is a 24 y.o. [id number] at 71w3dho presents to maternity admissions reporting severe headache which started at 6pm.   Tried ASA 842mfor headache without relief. .  Had elevated BP at home and has been seeing floaters.  Has had decreased fetal movement.  Is concerned because prior baby (2nd)  had abnormal doppler studies at the end of pregnancy, necessitating induction of labor. So she is worried that this is happening again with this baby.  Third pregnancy got induced at 38+ wks for ALT of 48 (nl 0-44) and headaches with intermittent elevations in BP.   She denies vaginal bleeding, vaginal itching/burning, urinary symptoms, dizziness, n/v, diarrhea, constipation or fever/chills.  She denies RUQ abdominal pain.  Hypertension The problem has been waxing and waning since onset. Associated symptoms include blurred vision, headaches and peripheral edema. Pertinent negatives include no palpitations. There are no associated agents to hypertension. Past treatments include nothing. There are no compliance problems.   Headache  The current episode started today. The problem occurs constantly. The problem has been gradually worsening. The quality of the pain is described as aching. The pain is severe. Associated symptoms include blurred vision and a visual change. Pertinent negatives include no fever or sinus pressure. Treatments tried: ASA. The treatment provided no relief. Her past medical history is significant for hypertension.   RN Note: Pt reporting severe headache which began at 6 pm, BP 142/96 and blurry/spots in vision, and increased swelling, denies URQ pain. Denies regular contractions, VB, LOF, some decreased fetal movement. Took an 8128mspirin for pain but it did not help. Hx of Preeclampsia.   Past Medical History: Past Medical History:  Diagnosis Date   Anxiety     Cervical incompetence during pregnancy    Depression    Hypothyroidism    PCOS (polycystic ovarian syndrome)    Pregnancy induced hypertension    With second pregnancy    Past obstetric history: OB History  Gravida Para Term Preterm AB Living  '5 3 2 1 1 3  ' SAB TAB Ectopic Multiple Live Births  1     0 3    # Outcome Date GA Lbr Len/2nd Weight Sex Delivery Anes PTL Lv  5 Current           4 Term 07/19/18 38w33w6d23 / 04:43 3630 g F Vag-Spont EPI  LIV     Birth Comments: WNL     Complications: Pre-eclampsia  3 Term 08/30/16 39w374w3d5 / 03:13 2695 g F Vag-Spont EPI Y LIV     Complications: History of cervical cerclage, Gestational hypertension  2 Preterm 09/19/12 54w0d23w0dg F Vag-Spont None Y LIV     Complications: Cervical incompetence, History of preterm premature rupture of membranes (PPROM)  1 SAB 2013            Past Surgical History: Past Surgical History:  Procedure Laterality Date   CERVICAL CERCLAGE     CERVICAL CERCLAGE N/A 03/09/2016   [facility]  Surgeon: LutherFlorian Buff Location: [facility];  Service: Gynecology;  Laterality: N/A;   CERVICAL CERCLAGE N/A 01/31/2018   Procedure: CERCLAGE CERVICAL;  Surgeon: Eure, Florian Buff Location: [facility];  Service: Gynecology;  Laterality: N/A;   CERVICAL CERCLAGE     Placed in 2013, 2017, and 2019   CERVICAL CERCLAGE N/A 03/13/2020  Procedure: CERCLAGE CERVICAL ([facility]);  Surgeon: Florian Buff, MD;  Location: [facility];  Service: Gynecology;  Laterality: N/A;   labial reduction      Family History: Family History  Problem Relation Age of Onset   Arthritis Mother    Lupus Mother    Autoimmune disease Mother    Crohn's disease Mother    Cancer Maternal Grandmother        breast   Heart disease Maternal Grandfather    Heart disease Paternal Grandfather    Hypertension Paternal Grandfather    Lupus Maternal Aunt     Social History: Social History   Tobacco  Use   Smoking status: Never Smoker   Smokeless tobacco: Never Used  Scientific laboratory technician Use: Never assessed  Substance Use Topics   Alcohol use: Not Currently    Comment: occ; not now   Drug use: No    Allergies:  Allergies  Allergen Reactions   Shellfish Allergy Anaphylaxis and Swelling    Only Crawfish not allergic to any other shellfish     Meds:  Medications Prior to Admission  Medication Sig Dispense Refill Last Dose   aspirin EC 81 MG tablet Take 81 mg by mouth in the morning and at bedtime.   08/19/2020 at Unknown time   Blood Pressure Monitor MISC For regular home bp monitoring during pregnancy 1 each 0 08/19/2020 at Unknown time   Prenatal Vit-Fe Fumarate-FA (MULTIVITAMIN-PRENATAL) 27-0.8 MG TABS tablet Take 1 tablet by mouth daily at 12 noon.   08/19/2020 at Unknown time   docusate sodium (COLACE) 100 MG capsule Take 1 capsule (100 mg total) by mouth 2 (two) times daily. (Patient taking differently: Take 100 mg by mouth. ) 60 capsule 0     I have reviewed patient's Past Medical Hx, Surgical Hx, Family Hx, Social Hx, medications and allergies.   ROS:  Review of Systems  Constitutional: Negative for fever.  HENT: Negative for sinus pressure.   Eyes: Positive for blurred vision.  Cardiovascular: Negative for palpitations.  Neurological: Positive for headaches.   Other systems negative  Physical Exam   Patient Vitals for the past 24 hrs:  BP Temp Temp src Pulse Resp SpO2 Height Weight  08/19/20 2046 130/84 -- -- (!) 136 -- -- -- --  08/19/20 2045 -- -- -- -- -- 99 % -- --  08/19/20 2040 -- -- -- -- -- 99 % -- --  08/19/20 2035 -- -- -- -- -- 99 % -- --  08/19/20 2034 (!) 128/93 -- -- (!) 117 -- -- -- --  08/19/20 2030 -- -- -- -- -- 99 % -- --  08/19/20 2025 -- -- -- -- -- 99 % -- --  08/19/20 2024 125/86 98.3 F (36.8 C) Oral (!) 118 20 100 % '4\' 11"'  (1.499 m) 93 kg  08/19/20 2020 125/86 -- -- (!) 121 -- 99 % -- --   Constitutional: Well-developed,  well-nourished female in pain with headache..  Cardiovascular: mild tachycardia with regular rhythm Respiratory: normal effort, no disress.  99-100% oxygen saturation GI: Abd soft, non-tender, gravid appropriate for gestational age.   No rebound or guarding. MS: Extremities nontender, no edema, normal ROM Neurologic: Alert and oriented x 4.  DTRs 2+ GU: Neg CVAT.  PELVIC EXAM:  deferred  FHT:  Baseline 140 , moderate variability, accelerations present, no decelerations Contractions: Irregular     Labs: Results for orders placed or performed during the hospital encounter of 08/19/20 (from the past  24 hour(s))  Urinalysis, Routine w reflex microscopic Urine, Clean Catch     Status: Abnormal   Collection Time: 08/19/20  8:01 PM  Result Value Ref Range   Color, Urine AMBER (A) YELLOW   APPearance HAZY (A) CLEAR   Specific Gravity, Urine 1.021 1.005 - 1.030   pH 6.0 5.0 - 8.0   Glucose, UA NEGATIVE NEGATIVE mg/dL   Hgb urine dipstick NEGATIVE NEGATIVE   Bilirubin Urine NEGATIVE NEGATIVE   Ketones, ur NEGATIVE NEGATIVE mg/dL   Protein, ur 30 (A) NEGATIVE mg/dL   Nitrite NEGATIVE NEGATIVE   Leukocytes,Ua NEGATIVE NEGATIVE   RBC / HPF 0-5 0 - 5 RBC/hpf   WBC, UA 6-10 0 - 5 WBC/hpf   Bacteria, UA RARE (A) NONE SEEN   Squamous Epithelial / LPF 11-20 0 - 5   Mucus PRESENT   Protein / creatinine ratio, urine     Status: None   Collection Time: 08/19/20  8:45 PM  Result Value Ref Range   Creatinine, Urine 220.39 mg/dL   Total Protein, Urine 25 mg/dL   Protein Creatinine Ratio 0.11 0.00 - 0.15 mg/mg[Cre]  CBC     Status: Abnormal   Collection Time: 08/19/20  9:07 PM  Result Value Ref Range   WBC 9.3 4.0 - 10.5 K/uL   RBC 4.05 3.87 - 5.11 MIL/uL   Hemoglobin 11.0 (L) 12.0 - 15.0 g/dL   HCT 33.4 (L) 36 - 46 %   MCV 82.5 80.0 - 100.0 fL   MCH 27.2 26.0 - 34.0 pg   MCHC 32.9 30.0 - 36.0 g/dL   RDW 13.0 11.5 - 15.5 %   Platelets 346 150 - 400 K/uL   nRBC 0.0 0.0 - 0.2 %   Comprehensive metabolic panel     Status: Abnormal   Collection Time: 08/19/20  9:07 PM  Result Value Ref Range   Sodium 134 (L) 135 - 145 mmol/L   Potassium 4.2 3.5 - 5.1 mmol/L   Chloride 105 98 - 111 mmol/L   CO2 20 (L) 22 - 32 mmol/L   Glucose, Bld 89 70 - 99 mg/dL   BUN 5 (L) 6 - 20 mg/dL   Creatinine, Ser 0.63 0.44 - 1.00 mg/dL   Calcium 8.8 (L) 8.9 - 10.3 mg/dL   Total Protein 6.5 6.5 - 8.1 g/dL   Albumin 2.5 (L) 3.5 - 5.0 g/dL   AST 18 15 - 41 U/L   ALT 14 0 - 44 U/L   Alkaline Phosphatase 141 (H) 38 - 126 U/L   Total Bilirubin 0.5 0.3 - 1.2 mg/dL   GFR calc non Af Amer >60 >60 mL/min   GFR calc Af Amer >60 >60 mL/min   Anion gap 9 5 - 15  Amnisure rupture of membrane (rom)not at [facility]     Status: None   Collection Time: 08/19/20 11:52 PM  Result Value Ref Range   Amnisure ROM NEGATIVE     O/Positive/-- (03/30 1720)  Imaging:    MAU Course/MDM: I have ordered labs and reviewed results. These are within normal limits, no elevation in transaminases and no thrombocytopenia.  Pr/Cr Ratio WNL NST reviewed, reassuring Consult Dr Ilda Basset with presentation, exam findings and test results. He recommends treating headache and discharging home with followup in office.  Treatments in MAU included Tylenol, Fentanyl and Benadryl for headache.  These reduced her headache from a "10" to an "8".  Ordered Reglan, second dose of Fentanyl, and Reglan which patient refused.   She  was very upset that we were planning to send her home after treating her headache.  Primary concerns were of fetal well-being (decreased movement and history of prior baby who had abnormal doppler studies) and elevations of blood pressure with severe headache.   I reconsulted Dr Ilda Basset and reviewed her concerns and additioinal BP measurements (2 elevated BPs out of 3 hours, separated by 2 hours)  She had reported elevated BP at home.  House supervisor brought in per patient request to speak to someone in Patient  Experience (not available at night).  Explained criteria for Gestational Hypertension that we had not quite met.    I discussed patient's request for Doppler studies with Sonographer, who said her guidelines require Full growth [place] and documentation of growth restriction in order to do Doppler studies.  I offered to order a BPP and patient states "it will be normal but the Dopplers could still be bad" and she was still very concerned for her baby's well-being.  Declined BPP study.   Just before leaving, she felt small gush of fluid.  We did an amnisure.  Patient wanted to leave and asked [place] to call her if it was positive and she would come back (resulted negative)  She stated she wanted to leave (husband is in car with children and needs to get to bed for work) and did not want to take additional meds for headache.  I also offered a Head CT to evaluated for any pathology.  I suggested she speak with provider in morning re: Doppler studies and discussing plan of care as well as her concerns.    Assessment: Single IUP at 66w4dIntermittent hypertension Severe headache Decreased fetal movement with reactive nonstress test/EFM  Plan: Discharge home Preeclampsia precautions Labor precautions and fetal kick counts Follow up in Office as scheduled in AM to discuss plan of care  Encouraged to return here or to other Urgent Care/ED if she develops worsening of symptoms, increase in pain, fever, or other concerning symptoms.   Pt stable at time of discharge.  MHansel FeinsteinCNM, MSN Certified Nurse-Midwife 08/19/2020 8:52 PM

## 2020-08-20 ENCOUNTER — Other Ambulatory Visit: Payer: Self-pay | Admitting: Obstetrics and Gynecology

## 2020-08-20 ENCOUNTER — Inpatient Hospital Stay (HOSPITAL_COMMUNITY)
Admission: AD | Admit: 2020-08-20 | Discharge: 2020-08-23 | DRG: 788 | Disposition: A | Discharge status: Home / Self Care | Payer: Medicaid Other | Attending: Obstetrics & Gynecology | Admitting: Obstetrics & Gynecology

## 2020-08-20 ENCOUNTER — Ambulatory Visit (INDEPENDENT_AMBULATORY_CARE_PROVIDER_SITE_OTHER): Payer: Medicaid Other | Admitting: Obstetrics and Gynecology

## 2020-08-20 ENCOUNTER — Other Ambulatory Visit: Payer: Self-pay

## 2020-08-20 ENCOUNTER — Encounter (HOSPITAL_COMMUNITY): Payer: Self-pay | Admitting: Obstetrics and Gynecology

## 2020-08-20 VITALS — BP 140/78 | HR 111 | Wt 206.0 lb

## 2020-08-20 DIAGNOSIS — R519 Headache, unspecified: Secondary | ICD-10-CM

## 2020-08-20 DIAGNOSIS — Z331 Pregnant state, incidental: Secondary | ICD-10-CM

## 2020-08-20 DIAGNOSIS — Z1389 Encounter for screening for other disorder: Secondary | ICD-10-CM

## 2020-08-20 DIAGNOSIS — O99891 Other specified diseases and conditions complicating pregnancy: Secondary | ICD-10-CM

## 2020-08-20 DIAGNOSIS — Z2839 Other underimmunization status: Secondary | ICD-10-CM

## 2020-08-20 DIAGNOSIS — O368130 Decreased fetal movements, third trimester, not applicable or unspecified: Secondary | ICD-10-CM

## 2020-08-20 DIAGNOSIS — O09899 Supervision of other high risk pregnancies, unspecified trimester: Secondary | ICD-10-CM

## 2020-08-20 DIAGNOSIS — Z3A37 37 weeks gestation of pregnancy: Secondary | ICD-10-CM

## 2020-08-20 DIAGNOSIS — O0993 Supervision of high risk pregnancy, unspecified, third trimester: Secondary | ICD-10-CM

## 2020-08-20 DIAGNOSIS — O99824 Streptococcus B carrier state complicating childbirth: Secondary | ICD-10-CM | POA: Diagnosis present

## 2020-08-20 DIAGNOSIS — O26893 Other specified pregnancy related conditions, third trimester: Secondary | ICD-10-CM

## 2020-08-20 DIAGNOSIS — O1414 Severe pre-eclampsia complicating childbirth: Secondary | ICD-10-CM | POA: Diagnosis not present

## 2020-08-20 DIAGNOSIS — Z20822 Contact with and (suspected) exposure to covid-19: Secondary | ICD-10-CM | POA: Diagnosis not present

## 2020-08-20 DIAGNOSIS — Z8659 Personal history of other mental and behavioral disorders: Secondary | ICD-10-CM

## 2020-08-20 DIAGNOSIS — Z3480 Encounter for supervision of other normal pregnancy, unspecified trimester: Secondary | ICD-10-CM

## 2020-08-20 DIAGNOSIS — Z8742 Personal history of other diseases of the female genital tract: Secondary | ICD-10-CM

## 2020-08-20 DIAGNOSIS — O09299 Supervision of pregnancy with other poor reproductive or obstetric history, unspecified trimester: Secondary | ICD-10-CM

## 2020-08-20 DIAGNOSIS — O99214 Obesity complicating childbirth: Secondary | ICD-10-CM | POA: Diagnosis not present

## 2020-08-20 DIAGNOSIS — F418 Other specified anxiety disorders: Secondary | ICD-10-CM | POA: Diagnosis present

## 2020-08-20 DIAGNOSIS — E038 Other specified hypothyroidism: Secondary | ICD-10-CM | POA: Diagnosis present

## 2020-08-20 DIAGNOSIS — O0990 Supervision of high risk pregnancy, unspecified, unspecified trimester: Secondary | ICD-10-CM

## 2020-08-20 DIAGNOSIS — Z8759 Personal history of other complications of pregnancy, childbirth and the puerperium: Secondary | ICD-10-CM

## 2020-08-20 DIAGNOSIS — O1413 Severe pre-eclampsia, third trimester: Secondary | ICD-10-CM | POA: Diagnosis present

## 2020-08-20 HISTORY — DX: Anemia, unspecified: D64.9

## 2020-08-20 LAB — COMPREHENSIVE METABOLIC PANEL
ALT: 18 U/L (ref 0–44)
AST: 20 U/L (ref 15–41)
Albumin: 2.7 g/dL — ABNORMAL LOW (ref 3.5–5.0)
Alkaline Phosphatase: 153 U/L — ABNORMAL HIGH (ref 38–126)
Anion gap: 11 (ref 5–15)
BUN: 8 mg/dL (ref 6–20)
CO2: 19 mmol/L — ABNORMAL LOW (ref 22–32)
Calcium: 9 mg/dL (ref 8.9–10.3)
Chloride: 106 mmol/L (ref 98–111)
Creatinine, Ser: 0.61 mg/dL (ref 0.44–1.00)
GFR calc Af Amer: >60 mL/min (ref >60)
GFR calc non Af Amer: >60 mL/min (ref >60)
Glucose, Bld: 91 mg/dL (ref 70–99)
Potassium: 3.9 mmol/L (ref 3.5–5.1)
Sodium: 136 mmol/L (ref 135–145)
Total Bilirubin: 0.6 mg/dL (ref 0.3–1.2)
Total Protein: 6.5 g/dL (ref 6.5–8.1)

## 2020-08-20 LAB — POCT URINALYSIS DIPSTICK OB
Blood, UA: NEGATIVE
Glucose, UA: NEGATIVE
Leukocytes, UA: NEGATIVE
Nitrite, UA: NEGATIVE

## 2020-08-20 LAB — CBC
HCT: 33.4 % — ABNORMAL LOW (ref 36.0–46.0)
Hemoglobin: 10.9 g/dL — ABNORMAL LOW (ref 12.0–15.0)
MCH: 27.1 pg (ref 26.0–34.0)
MCHC: 32.6 g/dL (ref 30.0–36.0)
MCV: 83.1 fL (ref 80.0–100.0)
Platelets: 350 10*3/uL (ref 150–400)
RBC: 4.02 MIL/uL (ref 3.87–5.11)
RDW: 12.9 % (ref 11.5–15.5)
WBC: 10.2 10*3/uL (ref 4.0–10.5)
nRBC: 0 % (ref 0.0–0.2)

## 2020-08-20 LAB — TYPE AND SCREEN
ABO/RH(D): O POS
Antibody Screen: NEGATIVE

## 2020-08-20 LAB — RESPIRATORY PANEL BY RT PCR (FLU A&B, COVID)
Influenza A by PCR: NEGATIVE
Influenza B by PCR: NEGATIVE
SARS Coronavirus 2 by RT PCR: NEGATIVE

## 2020-08-20 LAB — AMNISURE RUPTURE OF MEMBRANE (ROM) NOT AT ARMC: Amnisure ROM: NEGATIVE

## 2020-08-20 MED ORDER — LIDOCAINE HCL (PF) 1 % IJ SOLN: 30.0000 mL | INTRAMUSCULAR | Status: DC | PRN

## 2020-08-20 MED ORDER — OXYTOCIN-SODIUM CHLORIDE 30-0.9 UT/500ML-% IV SOLN: 2.5000 [IU]/h | INTRAVENOUS | Status: DC

## 2020-08-20 MED ORDER — NIFEDIPINE 10 MG PO CAPS: 10.0000 mg | ORAL_CAPSULE | ORAL | Status: DC | PRN

## 2020-08-20 MED ORDER — OXYCODONE-ACETAMINOPHEN 5-325 MG PO TABS: 2.0000 | ORAL_TABLET | ORAL | Status: DC | PRN

## 2020-08-20 MED ORDER — NIFEDIPINE 10 MG PO CAPS: 20.0000 mg | ORAL_CAPSULE | ORAL | Status: DC | PRN

## 2020-08-20 MED ORDER — ACETAMINOPHEN 325 MG PO TABS
650.0000 mg | ORAL_TABLET | ORAL | Status: DC | PRN
  Administered 2020-08-20: 650 mg via ORAL
  Filled 2020-08-20: qty 2

## 2020-08-20 MED ORDER — ONDANSETRON HCL 4 MG/2ML IJ SOLN
4.0000 mg | Freq: Four times a day (QID) | INTRAMUSCULAR | Status: DC | PRN
  Administered 2020-08-21 (×2): 4 mg via INTRAVENOUS
  Filled 2020-08-20 (×2): qty 2

## 2020-08-20 MED ORDER — MISOPROSTOL 25 MCG QUARTER TABLET
25.0000 ug | ORAL_TABLET | ORAL | Status: DC | PRN
  Administered 2020-08-20: 25 ug via VAGINAL
  Filled 2020-08-20: qty 1

## 2020-08-20 MED ORDER — FLEET ENEMA 7-19 GM/118ML RE ENEM: 1.0000 | ENEMA | Freq: Once | RECTAL | Status: DC | PRN

## 2020-08-20 MED ORDER — OXYCODONE-ACETAMINOPHEN 5-325 MG PO TABS: 1.0000 | ORAL_TABLET | ORAL | Status: DC | PRN

## 2020-08-20 MED ORDER — TERBUTALINE SULFATE 1 MG/ML IJ SOLN
0.2500 mg | Freq: Once | INTRAMUSCULAR | Status: AC | PRN
  Administered 2020-08-21: 0.25 mg via SUBCUTANEOUS
  Filled 2020-08-20: qty 1

## 2020-08-20 MED ORDER — MAGNESIUM SULFATE 40 GM/1000ML IV SOLN
2.0000 g/h | INTRAVENOUS | Status: DC
  Filled 2020-08-20: qty 1000

## 2020-08-20 MED ORDER — SODIUM CHLORIDE 0.9 % IV SOLN
5.0000 10*6.[IU] | Freq: Once | INTRAVENOUS | Status: AC
  Administered 2020-08-20: 5 10*6.[IU] via INTRAVENOUS
  Filled 2020-08-20: qty 5

## 2020-08-20 MED ORDER — LACTATED RINGERS IV SOLN: INTRAVENOUS | Status: DC

## 2020-08-20 MED ORDER — PENICILLIN G POT IN DEXTROSE 60000 UNIT/ML IV SOLN
3.0000 10*6.[IU] | INTRAVENOUS | Status: DC
  Administered 2020-08-21 (×3): 3 10*6.[IU] via INTRAVENOUS
  Filled 2020-08-20 (×3): qty 50

## 2020-08-20 MED ORDER — LACTATED RINGERS IV SOLN
500.0000 mL | INTRAVENOUS | Status: DC | PRN
  Administered 2020-08-21: 1000 mL via INTRAVENOUS

## 2020-08-20 MED ORDER — LABETALOL HCL 5 MG/ML IV SOLN: 40.0000 mg | INTRAVENOUS | Status: DC | PRN

## 2020-08-20 MED ORDER — MAGNESIUM SULFATE BOLUS VIA INFUSION
4.0000 g | Freq: Once | INTRAVENOUS | Status: AC
  Administered 2020-08-20: 4 g via INTRAVENOUS
  Filled 2020-08-20: qty 1000

## 2020-08-20 MED ORDER — OXYTOCIN BOLUS FROM INFUSION: 333.0000 mL | Freq: Once | INTRAVENOUS | Status: DC

## 2020-08-20 MED ORDER — HYDROXYZINE HCL 50 MG PO TABS: 50.0000 mg | ORAL_TABLET | Freq: Four times a day (QID) | ORAL | Status: DC | PRN

## 2020-08-20 MED ORDER — SOD CITRATE-CITRIC ACID 500-334 MG/5ML PO SOLN
30.0000 mL | ORAL | Status: DC | PRN
  Administered 2020-08-21: 30 mL via ORAL
  Filled 2020-08-20: qty 15

## 2020-08-20 NOTE — Progress Notes (Signed)
 HIGH-RISK PREGNANCY VISIT Patient name: Tara Mcmahon MRN [id number]  Date of birth: 08/19/1996 Chief Complaint:   Routine Prenatal Visit (headache not gone away, seem women's 9-22/ decrease fetal movement)  History of Present Illness:   Tara Mcmahon is a 24 y.o. G5P2113 female at [redacted]w[redacted]d with an Estimated Date of Delivery: 09/06/20 being seen today for ongoing management of a high-risk pregnancy complicated by hx cerclage.  Today she reports vomiting and headache.  BP 140/78   Pulse (!) 111   Wt 206 lb (93.4 kg)   LMP 11/12/2019 (Exact Date)   BMI 41.61 kg/m    sHE HAS A HX OF GHTN, AS WELL AS A HISTORY OF PRE-E WITH PRIOR PREGNANCIES.   she has an additional hx of severe anxiety but this pregnancy she sees a counsellor regularly , and has not lived with recurrent weekly visits to MAU like she did last time, and she feels her anxiety is better controlled than before.She spoke to counsellor before coming to the office today,so she was calm.  Depression screen PHQ 2/9 06/05/2020 02/25/2020 12/13/2017 06/23/2017  Decreased Interest 1 0 1 0  Down, Depressed, Hopeless 0 0 1 0  PHQ - 2 Score 1 0 2 0  Altered sleeping 1 - 0 -  Tired, decreased energy 1 1 1 -  Change in appetite 0 0 0 -  Feeling bad or failure about yourself  0 0 0 -  Trouble concentrating 0 0 0 -  Moving slowly or fidgety/restless 0 0 1 -  Suicidal thoughts 0 0 0 -  PHQ-9 Score 3 - 4 -  Difficult doing work/chores Not difficult at all Not difficult at all Not difficult at all -    Contractions: Irregular.  .  Movement: (!) Decreased. denies leaking of fluid.  Review of Systems:   Pertinent items are noted in HPI Denies abnormal vaginal discharge w/ itching/odor/irritation, , visual changes, shortness of breath, chest pain, abdominal pain, severe nausea/vomiting, or problems with urination or bowel movements unless otherwise stated above. Pertinent History Reviewed:  Reviewed past medical,surgical, social,  obstetrical and family history.  Reviewed problem list, medications and allergies. Physical Assessment:   Vitals:   08/20/20 1503  BP: 140/78  Pulse: (!) 111  Weight: 206 lb (93.4 kg)  Body mass index is 41.61 kg/m.           Physical Examination:   General appearance: alert, well appearing, and in no distress, overweight and ill-appearing  Mental status: alert, oriented to person, place, and time, e  Skin: warm & dry   Extremities: Edema: Trace    Cardiovascular: normal heart rate noted  Respiratory: normal respiratory effort, no distress  Abdomen: gravid, soft, non-tender fhr 145.  Pelvic: Cervical exam performed 1.5-2 cm/ long/-2-3 vertex, cerclage removed last visit.cervix texture irregular.        Fetal Status:     Movement: (!) Decreased  pt noted movement while I was in room fhr 145.  Fetal Surveillance Testing today: sent to L& D   Chaperone: Leonna Ameduite    Results for orders placed or performed in visit on 08/20/20 (from the past 24 hour(s))  POC Urinalysis Dipstick OB   Collection Time: 08/20/20  3:15 PM  Result Value Ref Range   Color, UA     Clarity, UA     Glucose, UA Negative Negative   Bilirubin, UA     Ketones, UA small    Spec Grav, UA       Blood, UA neg    pH, UA     POC,PROTEIN,UA Trace Negative, Trace, Small (1+), Moderate (2+), Large (3+), 4+   Urobilinogen, UA     Nitrite, UA neg    Leukocytes, UA Negative Negative   Appearance     Odor    Results for orders placed or performed during the hospital encounter of 08/19/20 (from the past 24 hour(s))  Urinalysis, Routine w reflex microscopic Urine, Clean Catch   Collection Time: 08/19/20  8:01 PM  Result Value Ref Range   Color, Urine AMBER (A) YELLOW   APPearance HAZY (A) CLEAR   Specific Gravity, Urine 1.021 1.005 - 1.030   pH 6.0 5.0 - 8.0   Glucose, UA NEGATIVE NEGATIVE mg/dL   Hgb urine dipstick NEGATIVE NEGATIVE   Bilirubin Urine NEGATIVE NEGATIVE   Ketones, ur NEGATIVE NEGATIVE mg/dL    Protein, ur 30 (A) NEGATIVE mg/dL   Nitrite NEGATIVE NEGATIVE   Leukocytes,Ua NEGATIVE NEGATIVE   RBC / HPF 0-5 0 - 5 RBC/hpf   WBC, UA 6-10 0 - 5 WBC/hpf   Bacteria, UA RARE (A) NONE SEEN   Squamous Epithelial / LPF 11-20 0 - 5   Mucus PRESENT   Protein / creatinine ratio, urine   Collection Time: 08/19/20  8:45 PM  Result Value Ref Range   Creatinine, Urine 220.39 mg/dL   Total Protein, Urine 25 mg/dL   Protein Creatinine Ratio 0.11 0.00 - 0.15 mg/mg[Cre]  CBC   Collection Time: 08/19/20  9:07 PM  Result Value Ref Range   WBC 9.3 4.0 - 10.5 K/uL   RBC 4.05 3.87 - 5.11 MIL/uL   Hemoglobin 11.0 (L) 12.0 - 15.0 g/dL   HCT 33.4 (L) 36 - 46 %   MCV 82.5 80.0 - 100.0 fL   MCH 27.2 26.0 - 34.0 pg   MCHC 32.9 30.0 - 36.0 g/dL   RDW 13.0 11.5 - 15.5 %   Platelets 346 150 - 400 K/uL   nRBC 0.0 0.0 - 0.2 %  Comprehensive metabolic panel   Collection Time: 08/19/20  9:07 PM  Result Value Ref Range   Sodium 134 (L) 135 - 145 mmol/L   Potassium 4.2 3.5 - 5.1 mmol/L   Chloride 105 98 - 111 mmol/L   CO2 20 (L) 22 - 32 mmol/L   Glucose, Bld 89 70 - 99 mg/dL   BUN 5 (L) 6 - 20 mg/dL   Creatinine, Ser 0.63 0.44 - 1.00 mg/dL   Calcium 8.8 (L) 8.9 - 10.3 mg/dL   Total Protein 6.5 6.5 - 8.1 g/dL   Albumin 2.5 (L) 3.5 - 5.0 g/dL   AST 18 15 - 41 U/L   ALT 14 0 - 44 U/L   Alkaline Phosphatase 141 (H) 38 - 126 U/L   Total Bilirubin 0.5 0.3 - 1.2 mg/dL   GFR calc non Af Amer >60 >60 mL/min   GFR calc Af Amer >60 >60 mL/min   Anion gap 9 5 - 15  Amnisure rupture of membrane (rom)not at [facility]   Collection Time: 08/19/20 11:52 PM  Result Value Ref Range   Amnisure ROM NEGATIVE     Assessment & Plan:  1) High-risk pregnancy G5P2113 at [redacted]w[redacted]d with an Estimated Date of Delivery: 09/06/20   2) headache unrelieved x 24 hr without history of migraines, will treat as PreE with Severe features.  Discussed with Dr Ervin, will direct admit for IOL,   3) pt insists on going home briefly , I    insisted she be at hospital within 90 minutes, stable  Meds: No orders of the defined types were placed in this encounter.   Labs/procedures today: u/a cervix exam,   Treatment Plan:  Direct admit for iOL for preE with SEVERE FEATURES (HA) AT [redacted]w[redacted]d    Follow-up: No follow-ups on file.  Orders Placed This Encounter  Procedures  . POC Urinalysis Dipstick OB   Jahnaya Branscome V My Rinke CNM, WHNP-BC 08/20/2020 5:27 PM  

## 2020-08-20 NOTE — Progress Notes (Signed)
LABOR PROGRESS NOTE  Tara Mcmahon is a 24 y.o. [phone] at [redacted]w[redacted]d  admitted for IOL due to pre-e with severe features.   Subjective: Doing well, still having intermittent headache and blurred vision, just started Mag.   Objective: BP 111/74   Pulse (!) 116   Temp 98 F (36.7 C) (Oral)   Resp 18   Ht 4\' 11"  (1.499 m)   Wt 91.9 kg   LMP 11/12/2019 (Exact Date)   BMI 40.92 kg/m  or  Vitals:   08/20/20 1915 08/20/20 1930 08/20/20 2016 08/20/20 2020  BP: 127/81 117/79 109/76 111/74  Pulse: (!) 117 (!) 119 (!) 105 (!) 116  Resp: 18 18    Temp:  98 F (36.7 C)    TempSrc:  Oral    Weight: 91.9 kg     Height: 4\' 11"  (1.499 m)       Dilation: 3 Effacement (%): 20 Cervical Position: Posterior Station: -3 Presentation: Vertex Exam by:: Dr. : baseline rate 120, moderate varibility, +acel, -decel Toco: irritable   Labs: Lab Results  Component Value Date   WBC 10.2 08/20/2020   HGB 10.9 (L) 08/20/2020   HCT 33.4 (L) 08/20/2020   MCV 83.1 08/20/2020   PLT 350 08/20/2020    Patient Active Problem List   Diagnosis Date Noted  . Preeclampsia, severe, third trimester 08/20/2020  . Supervision of high risk pregnancy, antepartum 06/05/2020  . History of gestational hypertension 02/26/2020  . Encounter for supervision of other normal pregnancy, unspecified trimester 02/25/2020  . Rubella non-immune status, antepartum 12/14/2017  . History of cervical incompetence in pregnancy, currently pregnant 11/23/2017  . History of preterm delivery, currently pregnant 11/23/2017  . History of PCOS 06/23/2017  . Subclinical hypothyroidism 02/04/2016  . History of suicidal ideation 02/03/2016  . Depression with anxiety 01/27/2016  . Family history of systemic lupus erythematosus (SLE) in mother 01/27/2016    Assessment / Plan: 42 y.o. 03/28/2016 at [redacted]w[redacted]d here for IOL for pre-e with severe features.   Labor: [id number] to place FB, however progressed well with cytotec placed at  1932, no FB needed. Will check in 2-3 hours.   Fetal Wellbeing:  Cat 1 strip  Pain Control:  Desires epidural  Anticipated MOD:  SVD  #Pre-e with severe features: Stable.  HTN + HA/vision changes. BP at goal without any severe range pressures. PEC labs wnl.  --Cont Mag --Monitor BP, Procardia/Labetalol PRN  --Tylenol/IV pain medication PRN HA   #GBS positive:  PCN on board.   [redacted]w[redacted]d, DO Family Medicine PGY-3   08/20/2020, 8:58 PM

## 2020-08-20 NOTE — H&P (Addendum)
LABOR AND DELIVERY ADMISSION HISTORY AND PHYSICAL NOTE  Tara Mcmahon is a 24 y.o. female [phone] with IUP at 28w4dby 647w2dresenting for IOL for preE w severe features (elevated BP + HA).   She reports positive fetal movement. She denies leakage of fluid or vaginal bleeding. Patient irregular, but painful. Reports HA since 6 pm last night, shooting pains through temples. Denies SOB, CP, swelling.  She plans on breast feeding. Her contraception plan is: IUD outpt.  Prenatal History/Complications: PNC at [facility]:  _0 , normal anatomy, cephalic presentation, anterior placenta, 73%ile, EFW [id number] complications:  -hx of preE -cervical incompetence (s/p McDonald cerclage and prometrium)  Past Medical History: Past Medical History:  Diagnosis Date   Anxiety    Cervical incompetence during pregnancy    Depression    Hypothyroidism    PCOS (polycystic ovarian syndrome)    Pregnancy induced hypertension    With second pregnancy    Past Surgical History: Past Surgical History:  Procedure Laterality Date   CERVICAL CERCLAGE     CERVICAL CERCLAGE N/A 03/09/2016   [facility]  Surgeon: LuFlorian BuffMD;  Location: [facility];  Service: Gynecology;  Laterality: N/A;   CERVICAL CERCLAGE N/A 01/31/2018   Procedure: CERCLAGE CERVICAL;  Surgeon: EuFlorian BuffMD;  Location: [facility];  Service: Gynecology;  Laterality: N/A;   CERVICAL CERCLAGE     Placed in 2013, 2017, and 2019   CERVICAL CERCLAGE N/A 03/13/2020   Procedure: CERCLAGE CERVICAL (Mcdonald);  Surgeon: EuFlorian BuffMD;  Location: [facility];  Service: Gynecology;  Laterality: N/A;   labial reduction      Obstetrical History: OB History     Gravida  5   Para  3   Term  2   Preterm  1   AB  1   Living  3      SAB  1   TAB      Ectopic      Multiple  0   Live Births  3           Social History: Social History   Socioeconomic History   Marital status: Married     Spouse name: Not on file   Number of children: Not on file   Years of education: Not on file   Highest education level: Not on file  Occupational History   Not on file  Tobacco Use   Smoking status: Never Smoker   Smokeless tobacco: Never Used  Vaping Use   Vaping Use: Never assessed  Substance and Sexual Activity   Alcohol use: Not Currently    Comment: occ; not now   Drug use: No   Sexual activity: Yes    Birth control/protection: None  Other Topics Concern   Not on file  Social History Narrative   Not on file   Social Determinants of Health   Financial Resource Strain: Low Risk    Difficulty of Paying Living Expenses: Not hard at all  Food Insecurity: No Food Insecurity   Worried About RuCharity fundraisern the Last Year: Never true   Ran Out of Food in the Last Year: Never true  Transportation Needs: No Transportation Needs   Lack of Transportation (Medical): No   Lack of Transportation (Non-Medical): No  Physical Activity: Inactive   Days of Exercise per Week: 0 days   Minutes of Exercise per Session: 0 min  Stress: No Stress Concern Present  Feeling of Stress : Only a little  Social Connections: Moderately Isolated   Frequency of Communication with Friends and Family: More than three times a week   Frequency of Social Gatherings with Friends and Family: Never   Attends Religious Services: Never   Marine scientist or Organizations: No   Attends Music therapist: Never   Marital Status: Married    Family History: Family History  Problem Relation Age of Onset   Arthritis Mother    Lupus Mother    Autoimmune disease Mother    Crohn's disease Mother    Cancer Maternal Grandmother        breast   Heart disease Maternal Grandfather    Heart disease Paternal Grandfather    Hypertension Paternal Grandfather    Lupus Maternal Aunt     Allergies: Allergies  Allergen Reactions   Shellfish Allergy Anaphylaxis and Swelling    Only Crawfish  not allergic to any other shellfish     Medications Prior to Admission  Medication Sig Dispense Refill Last Dose   aspirin EC 81 MG tablet Take 81 mg by mouth in the morning and at bedtime.      Blood Pressure Monitor MISC For regular home bp monitoring during pregnancy 1 each 0    docusate sodium (COLACE) 100 MG capsule Take 1 capsule (100 mg total) by mouth 2 (two) times daily. (Patient not taking: Reported on 08/20/2020) 60 capsule 0    Prenatal Vit-Fe Fumarate-FA (MULTIVITAMIN-PRENATAL) 27-0.8 MG TABS tablet Take 1 tablet by mouth daily at 12 noon.        Review of Systems  All systems reviewed and negative except as stated in HPI  Physical Exam Last menstrual period 11/12/2019. General appearance: alert, oriented, NAD Lungs: normal respiratory effort Abdomen: soft, non-tender; gravid Extremities: No calf swelling or tenderness  Fetal monitoring: Baseline: 140 bpm, Variability: moderate, Accelerations: Reactive and Decelerations: Absent Uterine activity: None    Prenatal labs: ABO, Rh: O/Positive/-- (03/30 1720) Antibody: Negative (07/09 0954) Rubella: 3.22 (03/30 1720) RPR: Non Reactive (07/09 0954)  HBsAg: Negative (03/30 1720)  HIV: Non Reactive (07/09 0954)  GC/Chlamydia: Negative  GBS: Positive/-- (09/17 1600)  2-hr GTT: wnl Genetic screening: declined Anatomy US: normal  Prenatal Transfer Tool  Maternal Diabetes: No Genetic Screening: Declined Maternal Ultrasounds/Referrals: Normal Fetal Ultrasounds or other Referrals:  None Maternal Substance Abuse:  No Significant Maternal Medications:  None Significant Maternal Lab Results: Group B Strep positive  Results for orders placed or performed in visit on 08/20/20 (from the past 24 hour(s))  POC Urinalysis Dipstick OB   Collection Time: 08/20/20  3:15 PM  Result Value Ref Range   Color, UA     Clarity, UA     Glucose, UA Negative Negative   Bilirubin, UA     Ketones, UA small    Spec Grav, UA     Blood,  UA neg    pH, UA     POC,PROTEIN,UA Trace Negative, Trace, Small (1+), Moderate (2+), Large (3+), 4+   Urobilinogen, UA     Nitrite, UA neg    Leukocytes, UA Negative Negative   Appearance     Odor    Results for orders placed or performed during the hospital encounter of 08/19/20 (from the past 24 hour(s))  Urinalysis, Routine w reflex microscopic Urine, Clean Catch   Collection Time: 08/19/20  8:01 PM  Result Value Ref Range   Color, Urine AMBER (A) YELLOW   APPearance HAZY (A)  CLEAR   Specific Gravity, Urine 1.021 1.005 - 1.030   pH 6.0 5.0 - 8.0   Glucose, UA NEGATIVE NEGATIVE mg/dL   Hgb urine dipstick NEGATIVE NEGATIVE   Bilirubin Urine NEGATIVE NEGATIVE   Ketones, ur NEGATIVE NEGATIVE mg/dL   Protein, ur 30 (A) NEGATIVE mg/dL   Nitrite NEGATIVE NEGATIVE   Leukocytes,Ua NEGATIVE NEGATIVE   RBC / HPF 0-5 0 - 5 RBC/hpf   WBC, UA 6-10 0 - 5 WBC/hpf   Bacteria, UA RARE (A) NONE SEEN   Squamous Epithelial / LPF 11-20 0 - 5   Mucus PRESENT   Protein / creatinine ratio, urine   Collection Time: 08/19/20  8:45 PM  Result Value Ref Range   Creatinine, Urine 220.39 mg/dL   Total Protein, Urine 25 mg/dL   Protein Creatinine Ratio 0.11 0.00 - 0.15 mg/mg[Cre]  CBC   Collection Time: 08/19/20  9:07 PM  Result Value Ref Range   WBC 9.3 4.0 - 10.5 K/uL   RBC 4.05 3.87 - 5.11 MIL/uL   Hemoglobin 11.0 (L) 12.0 - 15.0 g/dL   HCT 33.4 (L) 36 - 46 %   MCV 82.5 80.0 - 100.0 fL   MCH 27.2 26.0 - 34.0 pg   MCHC 32.9 30.0 - 36.0 g/dL   RDW 13.0 11.5 - 15.5 %   Platelets 346 150 - 400 K/uL   nRBC 0.0 0.0 - 0.2 %  Comprehensive metabolic panel   Collection Time: 08/19/20  9:07 PM  Result Value Ref Range   Sodium 134 (L) 135 - 145 mmol/L   Potassium 4.2 3.5 - 5.1 mmol/L   Chloride 105 98 - 111 mmol/L   CO2 20 (L) 22 - 32 mmol/L   Glucose, Bld 89 70 - 99 mg/dL   BUN 5 (L) 6 - 20 mg/dL   Creatinine, Ser 0.63 0.44 - 1.00 mg/dL   Calcium 8.8 (L) 8.9 - 10.3 mg/dL   Total Protein 6.5  6.5 - 8.1 g/dL   Albumin 2.5 (L) 3.5 - 5.0 g/dL   AST 18 15 - 41 U/L   ALT 14 0 - 44 U/L   Alkaline Phosphatase 141 (H) 38 - 126 U/L   Total Bilirubin 0.5 0.3 - 1.2 mg/dL   GFR calc non Af Amer >60 >60 mL/min   GFR calc Af Amer >60 >60 mL/min   Anion gap 9 5 - 15  Amnisure rupture of membrane (rom)not at [facility]   Collection Time: 08/19/20 11:52 PM  Result Value Ref Range   Amnisure ROM NEGATIVE     Patient Active Problem List   Diagnosis Date Noted   Preeclampsia, severe, third trimester 08/20/2020   Supervision of high risk pregnancy, antepartum 06/05/2020   History of gestational hypertension 02/26/2020   Encounter for supervision of other normal pregnancy, unspecified trimester 02/25/2020   Rubella non-immune status, antepartum 12/14/2017   History of cervical incompetence in pregnancy, currently pregnant 11/23/2017   History of preterm delivery, currently pregnant 11/23/2017   History of PCOS 06/23/2017   Subclinical hypothyroidism 02/04/2016   History of suicidal ideation 02/03/2016   Depression with anxiety 01/27/2016   Family history of systemic lupus erythematosus (SLE) in mother 01/27/2016    Assessment: Tara Mcmahon is a 24 y.o. [id number] at 44w4dhere for IOL.   #Labor: Cytotec @ 1932. #Fetal Wellbeing:  Category I #Pain Control: Epidural #GBS/ID: positive, PCN #COVID: swab pending #Anticipated MOD:  vaginal #preE w SF: IV Magnesium  ATalitha GivensMD, PGY-1 Family Medicine  Resident, OB Faculty Teaching Service  08/20/2020, 7:00 PM  I personally saw and evaluated the patient, performing the key elements of the service. I developed and verified the management plan that is described in the resident's/student's note, and I agree with the content with my edits above. VSS, HRR&R, Resp unlabored, Legs neg.  Nigel Berthold, CNM 08/21/2020 2:48 AM

## 2020-08-20 NOTE — Progress Notes (Signed)
HIGH-RISK PREGNANCY VISIT Patient name: Tara Mcmahon MRN [id number]  Date of birth: 1996/02/18 Chief Complaint:   Routine Prenatal Visit (headache not gone away, seem women's 9-22/ decrease fetal movement)  History of Present Illness:   Tara Mcmahon is a 24 y.o. [phone] female at [redacted]w[redacted]d with an Estimated Date of Delivery: 09/06/20 being seen today for ongoing management of a high-risk pregnancy complicated by hx cerclage.  Today she reports vomiting and headache.  BP 140/78   Pulse (!) 111   Wt 206 lb (93.4 kg)   LMP 11/12/2019 (Exact Date)   BMI 41.61 kg/m    sHE HAS A HX OF GHTN, AS WELL AS A HISTORY OF PRE-E WITH PRIOR PREGNANCIES.   she has an additional hx of severe anxiety but this pregnancy she sees a Haematologist regularly , and has not lived with recurrent weekly visits to [facility] like she did last time, and she feels her anxiety is better controlled than before.She spoke to counsellor before coming to the office today,so she was calm.  Depression screen [facility] 2/9 06/05/2020 02/25/2020 12/13/2017 06/23/2017  Decreased Interest 1 0 1 0  Down, Depressed, Hopeless 0 0 1 0  PHQ - 2 Score 1 0 2 0  Altered sleeping 1 - 0 -  Tired, decreased energy 1 1 1  -  Change in appetite 0 0 0 -  Feeling bad or failure about yourself  0 0 0 -  Trouble concentrating 0 0 0 -  Moving slowly or fidgety/restless 0 0 1 -  Suicidal thoughts 0 0 0 -  PHQ-9 Score 3 - 4 -  Difficult doing work/chores Not difficult at all Not difficult at all Not difficult at all -    Contractions: Irregular.  .  Movement: (!) Decreased. denies leaking of fluid.  Review of Systems:   Pertinent items are noted in HPI Denies abnormal vaginal discharge w/ itching/odor/irritation, , visual changes, shortness of breath, chest pain, abdominal pain, severe nausea/vomiting, or problems with urination or bowel movements unless otherwise stated above. Pertinent History Reviewed:  Reviewed past medical,surgical, social,  obstetrical and family history.  Reviewed problem list, medications and allergies. Physical Assessment:   Vitals:   08/20/20 1503  BP: 140/78  Pulse: (!) 111  Weight: 206 lb (93.4 kg)  Body mass index is 41.61 kg/m.           Physical Examination:   General appearance: alert, well appearing, and in no distress, overweight and ill-appearing  Mental status: alert, oriented to person, place, and time, e  Skin: warm & dry   Extremities: Edema: Trace    Cardiovascular: normal heart rate noted  Respiratory: normal respiratory effort, no distress  Abdomen: gravid, soft, non-tender fhr 145.  Pelvic: Cervical exam performed 1.5-2 cm/ long/-2-3 vertex, cerclage removed last visit.cervix texture irregular.        Fetal Status:     Movement: (!) Decreased  pt noted movement while I was in room fhr 145.  Fetal Surveillance Testing today: sent to L& D   Chaperone: Leonna Ameduite    Results for orders placed or performed in visit on 08/20/20 (from the past 24 hour(s))  POC Urinalysis Dipstick OB   Collection Time: 08/20/20  3:15 PM  Result Value Ref Range   Color, UA     Clarity, UA     Glucose, UA Negative Negative   Bilirubin, UA     Ketones, UA small    Spec Grav, UA  Blood, UA neg    pH, UA     POC,PROTEIN,UA Trace Negative, Trace, Small (1+), Moderate (2+), Large (3+), 4+   Urobilinogen, UA     Nitrite, UA neg    Leukocytes, UA Negative Negative   Appearance     Odor    Results for orders placed or performed during the hospital encounter of 08/19/20 (from the past 24 hour(s))  Urinalysis, Routine w reflex microscopic Urine, Clean Catch   Collection Time: 08/19/20  8:01 PM  Result Value Ref Range   Color, Urine AMBER (A) YELLOW   APPearance HAZY (A) CLEAR   Specific Gravity, Urine 1.021 1.005 - 1.030   pH 6.0 5.0 - 8.0   Glucose, UA NEGATIVE NEGATIVE mg/dL   Hgb urine dipstick NEGATIVE NEGATIVE   Bilirubin Urine NEGATIVE NEGATIVE   Ketones, ur NEGATIVE NEGATIVE mg/dL    Protein, ur 30 (A) NEGATIVE mg/dL   Nitrite NEGATIVE NEGATIVE   Leukocytes,Ua NEGATIVE NEGATIVE   RBC / HPF 0-5 0 - 5 RBC/hpf   WBC, UA 6-10 0 - 5 WBC/hpf   Bacteria, UA RARE (A) NONE SEEN   Squamous Epithelial / LPF 11-20 0 - 5   Mucus PRESENT   Protein / creatinine ratio, urine   Collection Time: 08/19/20  8:45 PM  Result Value Ref Range   Creatinine, Urine 220.39 mg/dL   Total Protein, Urine 25 mg/dL   Protein Creatinine Ratio 0.11 0.00 - 0.15 mg/mg[Cre]  CBC   Collection Time: 08/19/20  9:07 PM  Result Value Ref Range   WBC 9.3 4.0 - 10.5 K/uL   RBC 4.05 3.87 - 5.11 MIL/uL   Hemoglobin 11.0 (L) 12.0 - 15.0 g/dL   HCT 59.5 (L) 36 - 46 %   MCV 82.5 80.0 - 100.0 fL   MCH 27.2 26.0 - 34.0 pg   MCHC 32.9 30.0 - 36.0 g/dL   RDW 63.8 75.6 - 43.3 %   Platelets 346 150 - 400 K/uL   nRBC 0.0 0.0 - 0.2 %  Comprehensive metabolic panel   Collection Time: 08/19/20  9:07 PM  Result Value Ref Range   Sodium 134 (L) 135 - 145 mmol/L   Potassium 4.2 3.5 - 5.1 mmol/L   Chloride 105 98 - 111 mmol/L   CO2 20 (L) 22 - 32 mmol/L   Glucose, Bld 89 70 - 99 mg/dL   BUN 5 (L) 6 - 20 mg/dL   Creatinine, Ser 2.95 0.44 - 1.00 mg/dL   Calcium 8.8 (L) 8.9 - 10.3 mg/dL   Total Protein 6.5 6.5 - 8.1 g/dL   Albumin 2.5 (L) 3.5 - 5.0 g/dL   AST 18 15 - 41 U/L   ALT 14 0 - 44 U/L   Alkaline Phosphatase 141 (H) 38 - 126 U/L   Total Bilirubin 0.5 0.3 - 1.2 mg/dL   GFR calc non Af Amer >60 >60 mL/min   GFR calc Af Amer >60 >60 mL/min   Anion gap 9 5 - 15  Amnisure rupture of membrane (rom)not at [facility]   Collection Time: 08/19/20 11:52 PM  Result Value Ref Range   Amnisure ROM NEGATIVE     Assessment & Plan:  1) High-risk pregnancy [id number] at [redacted]w[redacted]d with an Estimated Date of Delivery: 09/06/20   2) headache unrelieved x 24 hr without history of migraines, will treat as PreE with Severe features.  Discussed with Dr Alysia Penna, will direct admit for IOL,   3) pt insists on going home briefly , I  insisted she be at hospital within 90 minutes, stable  Meds: No orders of the defined types were placed in this encounter.   Labs/procedures today: u/a cervix exam,   Treatment Plan:  Direct admit for iOL for preE with SEVERE FEATURES (HA) AT [redacted]w[redacted]d    Follow-up: No follow-ups on file.  Orders Placed This Encounter  Procedures  . POC Urinalysis Dipstick OB   Tilda Burrow CNM, [facility] 08/20/2020 5:27 PM

## 2020-08-21 ENCOUNTER — Encounter (HOSPITAL_COMMUNITY)
Admission: AD | Disposition: A | Discharge status: Home / Self Care | Payer: Self-pay | Source: Home / Self Care | Attending: Obstetrics & Gynecology

## 2020-08-21 ENCOUNTER — Encounter (HOSPITAL_COMMUNITY): Payer: Self-pay | Admitting: Obstetrics and Gynecology

## 2020-08-21 ENCOUNTER — Inpatient Hospital Stay (HOSPITAL_COMMUNITY): Payer: Medicaid Other | Admitting: Anesthesiology

## 2020-08-21 DIAGNOSIS — Z20822 Contact with and (suspected) exposure to covid-19: Secondary | ICD-10-CM | POA: Diagnosis not present

## 2020-08-21 DIAGNOSIS — O1414 Severe pre-eclampsia complicating childbirth: Secondary | ICD-10-CM | POA: Diagnosis not present

## 2020-08-21 DIAGNOSIS — O9902 Anemia complicating childbirth: Secondary | ICD-10-CM | POA: Diagnosis not present

## 2020-08-21 DIAGNOSIS — O99214 Obesity complicating childbirth: Secondary | ICD-10-CM | POA: Diagnosis not present

## 2020-08-21 DIAGNOSIS — D649 Anemia, unspecified: Secondary | ICD-10-CM | POA: Diagnosis not present

## 2020-08-21 DIAGNOSIS — Z3A37 37 weeks gestation of pregnancy: Secondary | ICD-10-CM | POA: Diagnosis not present

## 2020-08-21 DIAGNOSIS — O99824 Streptococcus B carrier state complicating childbirth: Secondary | ICD-10-CM | POA: Diagnosis not present

## 2020-08-21 LAB — RPR: RPR Ser Ql: NONREACTIVE

## 2020-08-21 LAB — PROTEIN / CREATININE RATIO, URINE
Creatinine, Urine: 96.94 mg/dL
Protein Creatinine Ratio: 0.13 mg/mg{Cre} (ref 0.00–0.15)
Total Protein, Urine: 13 mg/dL

## 2020-08-21 SURGERY — Surgical Case
Anesthesia: Epidural

## 2020-08-21 MED ORDER — PHENYLEPHRINE 40 MCG/ML (10ML) SYRINGE FOR IV PUSH (FOR BLOOD PRESSURE SUPPORT)
PREFILLED_SYRINGE | INTRAVENOUS | Status: AC
  Filled 2020-08-21: qty 10

## 2020-08-21 MED ORDER — DEXAMETHASONE SODIUM PHOSPHATE 4 MG/ML IJ SOLN
INTRAMUSCULAR | Status: DC | PRN
  Administered 2020-08-21: 4 mg via INTRAVENOUS

## 2020-08-21 MED ORDER — ONDANSETRON HCL 4 MG/2ML IJ SOLN
INTRAMUSCULAR | Status: DC | PRN
  Administered 2020-08-21: 4 mg via INTRAVENOUS

## 2020-08-21 MED ORDER — OXYCODONE HCL 5 MG PO TABS: 5.0000 mg | ORAL_TABLET | ORAL | Status: DC | PRN

## 2020-08-21 MED ORDER — SODIUM CHLORIDE 0.9 % IR SOLN
Status: DC | PRN
  Administered 2020-08-21: 1000 mL

## 2020-08-21 MED ORDER — FENTANYL-BUPIVACAINE-NACL 0.5-0.125-0.9 MG/250ML-% EP SOLN: 12.0000 mL/h | EPIDURAL | Status: DC | PRN

## 2020-08-21 MED ORDER — BUPIVACAINE HCL (PF) 0.5 % IJ SOLN
INTRAMUSCULAR | Status: AC
  Filled 2020-08-21: qty 30

## 2020-08-21 MED ORDER — COCONUT OIL OIL: 1.0000 "application " | TOPICAL_OIL | Status: DC | PRN

## 2020-08-21 MED ORDER — ACETAMINOPHEN 325 MG PO TABS: 325.0000 mg | ORAL_TABLET | ORAL | Status: DC | PRN

## 2020-08-21 MED ORDER — LACTATED RINGERS IV SOLN
500.0000 mL | Freq: Once | INTRAVENOUS | Status: AC
  Administered 2020-08-21: 500 mL via INTRAVENOUS

## 2020-08-21 MED ORDER — BUPIVACAINE HCL (PF) 0.5 % IJ SOLN
INTRAMUSCULAR | Status: DC | PRN
  Administered 2020-08-21: 20 mL

## 2020-08-21 MED ORDER — LACTATED RINGERS IV SOLN: INTRAVENOUS | Status: DC

## 2020-08-21 MED ORDER — FENTANYL CITRATE (PF) 100 MCG/2ML IJ SOLN
25.0000 ug | INTRAMUSCULAR | Status: DC | PRN
  Administered 2020-08-21 (×2): 50 ug via INTRAVENOUS

## 2020-08-21 MED ORDER — OXYCODONE HCL 5 MG PO TABS: 5.0000 mg | ORAL_TABLET | Freq: Once | ORAL | Status: DC | PRN

## 2020-08-21 MED ORDER — SIMETHICONE 80 MG PO CHEW
80.0000 mg | CHEWABLE_TABLET | Freq: Three times a day (TID) | ORAL | Status: DC
  Administered 2020-08-21 – 2020-08-22 (×2): 80 mg via ORAL
  Filled 2020-08-21 (×3): qty 1

## 2020-08-21 MED ORDER — DIBUCAINE (PERIANAL) 1 % EX OINT: 1.0000 "application " | TOPICAL_OINTMENT | CUTANEOUS | Status: DC | PRN

## 2020-08-21 MED ORDER — SIMETHICONE 80 MG PO CHEW: 80.0000 mg | CHEWABLE_TABLET | ORAL | Status: DC | PRN

## 2020-08-21 MED ORDER — SODIUM CHLORIDE 0.9 % IV SOLN
INTRAVENOUS | Status: AC
  Filled 2020-08-21: qty 500

## 2020-08-21 MED ORDER — OXYTOCIN-SODIUM CHLORIDE 30-0.9 UT/500ML-% IV SOLN: 2.5000 [IU]/h | INTRAVENOUS | Status: AC

## 2020-08-21 MED ORDER — MEPERIDINE HCL 25 MG/ML IJ SOLN
INTRAMUSCULAR | Status: AC
  Filled 2020-08-21: qty 1

## 2020-08-21 MED ORDER — ACETAMINOPHEN 500 MG PO TABS
1000.0000 mg | ORAL_TABLET | Freq: Four times a day (QID) | ORAL | Status: DC
  Administered 2020-08-21 – 2020-08-23 (×5): 1000 mg via ORAL
  Filled 2020-08-21 (×9): qty 2

## 2020-08-21 MED ORDER — PHENYLEPHRINE 40 MCG/ML (10ML) SYRINGE FOR IV PUSH (FOR BLOOD PRESSURE SUPPORT)
80.0000 ug | PREFILLED_SYRINGE | INTRAVENOUS | Status: DC | PRN
  Filled 2020-08-21: qty 10

## 2020-08-21 MED ORDER — EPHEDRINE 5 MG/ML INJ
10.0000 mg | INTRAVENOUS | Status: DC | PRN
  Filled 2020-08-21: qty 10

## 2020-08-21 MED ORDER — PRENATAL MULTIVITAMIN CH: 1.0000 | ORAL_TABLET | Freq: Every day | ORAL | Status: DC

## 2020-08-21 MED ORDER — FENTANYL CITRATE (PF) 100 MCG/2ML IJ SOLN
INTRAMUSCULAR | Status: DC | PRN
Start: 2020-08-21 — End: 2020-08-21
  Administered 2020-08-21: 100 ug via EPIDURAL

## 2020-08-21 MED ORDER — FENTANYL-BUPIVACAINE-NACL 0.5-0.125-0.9 MG/250ML-% EP SOLN
EPIDURAL | Status: AC
  Filled 2020-08-21: qty 250

## 2020-08-21 MED ORDER — OXYTOCIN-SODIUM CHLORIDE 30-0.9 UT/500ML-% IV SOLN
INTRAVENOUS | Status: DC | PRN
  Administered 2020-08-21: 500 mL via INTRAVENOUS

## 2020-08-21 MED ORDER — MENTHOL 3 MG MT LOZG: 1.0000 | LOZENGE | OROMUCOSAL | Status: DC | PRN

## 2020-08-21 MED ORDER — LACTATED RINGERS IV SOLN: INTRAVENOUS | Status: DC | PRN

## 2020-08-21 MED ORDER — CEFAZOLIN SODIUM-DEXTROSE 2-3 GM-%(50ML) IV SOLR
INTRAVENOUS | Status: DC | PRN
  Administered 2020-08-21: 2 g via INTRAVENOUS

## 2020-08-21 MED ORDER — BUPIVACAINE HCL (PF) 0.75 % IJ SOLN
INTRAMUSCULAR | Status: DC | PRN
Start: 2020-08-21 — End: 2020-08-21
  Administered 2020-08-21: 12 mL/h via EPIDURAL

## 2020-08-21 MED ORDER — PHENYLEPHRINE HCL-NACL 20-0.9 MG/250ML-% IV SOLN
INTRAVENOUS | Status: DC | PRN
  Administered 2020-08-21: 60 ug/min via INTRAVENOUS

## 2020-08-21 MED ORDER — PHENYLEPHRINE 40 MCG/ML (10ML) SYRINGE FOR IV PUSH (FOR BLOOD PRESSURE SUPPORT): 80.0000 ug | PREFILLED_SYRINGE | INTRAVENOUS | Status: DC | PRN

## 2020-08-21 MED ORDER — TERBUTALINE SULFATE 1 MG/ML IJ SOLN: 0.2500 mg | Freq: Once | INTRAMUSCULAR | Status: DC | PRN

## 2020-08-21 MED ORDER — METOCLOPRAMIDE HCL 5 MG/ML IJ SOLN
INTRAMUSCULAR | Status: DC | PRN
  Administered 2020-08-21: 10 mg via INTRAVENOUS

## 2020-08-21 MED ORDER — ENOXAPARIN SODIUM 60 MG/0.6ML ~~LOC~~ SOLN
50.0000 mg | SUBCUTANEOUS | Status: DC
  Administered 2020-08-22 – 2020-08-23 (×2): 50 mg via SUBCUTANEOUS
  Filled 2020-08-21 (×2): qty 0.6

## 2020-08-21 MED ORDER — PHENYLEPHRINE HCL (PRESSORS) 10 MG/ML IV SOLN
INTRAVENOUS | Status: DC | PRN
  Administered 2020-08-21: 40 ug via INTRAVENOUS
  Administered 2020-08-21 (×7): 80 ug via INTRAVENOUS

## 2020-08-21 MED ORDER — ONDANSETRON HCL 4 MG/2ML IJ SOLN: 4.0000 mg | Freq: Once | INTRAMUSCULAR | Status: DC | PRN

## 2020-08-21 MED ORDER — MAGNESIUM SULFATE 40 GM/1000ML IV SOLN
2.0000 g/h | INTRAVENOUS | Status: AC
  Administered 2020-08-21: 2 g/h via INTRAVENOUS
  Filled 2020-08-21: qty 1000

## 2020-08-21 MED ORDER — EPHEDRINE 5 MG/ML INJ
10.0000 mg | INTRAVENOUS | Status: DC | PRN
  Administered 2020-08-21: 10 mg via INTRAVENOUS

## 2020-08-21 MED ORDER — ZOLPIDEM TARTRATE 5 MG PO TABS: 5.0000 mg | ORAL_TABLET | Freq: Every evening | ORAL | Status: DC | PRN

## 2020-08-21 MED ORDER — LIDOCAINE HCL (PF) 1 % IJ SOLN
INTRAMUSCULAR | Status: DC | PRN
  Administered 2020-08-21: 2 mL via EPIDURAL
  Administered 2020-08-21: 10 mL via EPIDURAL

## 2020-08-21 MED ORDER — OXYCODONE HCL 5 MG/5ML PO SOLN: 5.0000 mg | Freq: Once | ORAL | Status: DC | PRN

## 2020-08-21 MED ORDER — FENTANYL CITRATE (PF) 100 MCG/2ML IJ SOLN
INTRAMUSCULAR | Status: AC
  Filled 2020-08-21: qty 2

## 2020-08-21 MED ORDER — DEXAMETHASONE SODIUM PHOSPHATE 4 MG/ML IJ SOLN
INTRAMUSCULAR | Status: AC
  Filled 2020-08-21: qty 1

## 2020-08-21 MED ORDER — ACETAMINOPHEN 160 MG/5ML PO SOLN: 325.0000 mg | ORAL | Status: DC | PRN

## 2020-08-21 MED ORDER — ONDANSETRON HCL 4 MG/2ML IJ SOLN
INTRAMUSCULAR | Status: AC
  Filled 2020-08-21: qty 2

## 2020-08-21 MED ORDER — DIPHENHYDRAMINE HCL 50 MG/ML IJ SOLN: 12.5000 mg | INTRAMUSCULAR | Status: DC | PRN

## 2020-08-21 MED ORDER — DIPHENHYDRAMINE HCL 25 MG PO CAPS: 25.0000 mg | ORAL_CAPSULE | Freq: Four times a day (QID) | ORAL | Status: DC | PRN

## 2020-08-21 MED ORDER — MORPHINE SULFATE (PF) 0.5 MG/ML IJ SOLN
INTRAMUSCULAR | Status: AC
  Filled 2020-08-21: qty 10

## 2020-08-21 MED ORDER — MEPERIDINE HCL 25 MG/ML IJ SOLN: 6.2500 mg | INTRAMUSCULAR | Status: DC | PRN

## 2020-08-21 MED ORDER — SODIUM CHLORIDE 0.9 % IV SOLN
INTRAVENOUS | Status: DC | PRN
  Administered 2020-08-21: 500 mg via INTRAVENOUS

## 2020-08-21 MED ORDER — TETANUS-DIPHTH-ACELL PERTUSSIS 5-2.5-18.5 LF-MCG/0.5 IM SUSP: 0.5000 mL | Freq: Once | INTRAMUSCULAR | Status: DC

## 2020-08-21 MED ORDER — IBUPROFEN 800 MG PO TABS
800.0000 mg | ORAL_TABLET | Freq: Three times a day (TID) | ORAL | Status: DC
  Administered 2020-08-21 – 2020-08-23 (×6): 800 mg via ORAL
  Filled 2020-08-21 (×8): qty 1

## 2020-08-21 MED ORDER — MEPERIDINE HCL 25 MG/ML IJ SOLN
INTRAMUSCULAR | Status: DC | PRN
Start: 2020-08-21 — End: 2020-08-21
  Administered 2020-08-21 (×2): 12.5 mg via INTRAVENOUS

## 2020-08-21 MED ORDER — WITCH HAZEL-GLYCERIN EX PADS: 1.0000 "application " | MEDICATED_PAD | CUTANEOUS | Status: DC | PRN

## 2020-08-21 MED ORDER — OXYTOCIN-SODIUM CHLORIDE 30-0.9 UT/500ML-% IV SOLN
1.0000 m[IU]/min | INTRAVENOUS | Status: DC
  Administered 2020-08-21: 2 m[IU]/min via INTRAVENOUS
  Filled 2020-08-21: qty 500

## 2020-08-21 MED ORDER — MORPHINE SULFATE (PF) 0.5 MG/ML IJ SOLN
INTRAMUSCULAR | Status: DC | PRN
Start: 2020-08-21 — End: 2020-08-21
  Administered 2020-08-21: 3 mg via EPIDURAL

## 2020-08-21 MED ORDER — SIMETHICONE 80 MG PO CHEW
80.0000 mg | CHEWABLE_TABLET | ORAL | Status: DC
  Administered 2020-08-22 – 2020-08-23 (×2): 80 mg via ORAL
  Filled 2020-08-21 (×2): qty 1

## 2020-08-21 MED ORDER — LIDOCAINE-EPINEPHRINE (PF) 2 %-1:200000 IJ SOLN
INTRAMUSCULAR | Status: DC | PRN
  Administered 2020-08-21 (×2): 10 mL via EPIDURAL

## 2020-08-21 MED ORDER — OXYTOCIN-SODIUM CHLORIDE 30-0.9 UT/500ML-% IV SOLN
INTRAVENOUS | Status: AC
  Filled 2020-08-21: qty 500

## 2020-08-21 MED ORDER — SENNOSIDES-DOCUSATE SODIUM 8.6-50 MG PO TABS
2.0000 | ORAL_TABLET | ORAL | Status: DC
  Administered 2020-08-22 – 2020-08-23 (×2): 2 via ORAL
  Filled 2020-08-21 (×2): qty 2

## 2020-08-21 SURGICAL SUPPLY — 43 items
BENZOIN TINCTURE PRP APPL 2/3 (GAUZE/BANDAGES/DRESSINGS) ×2 IMPLANT
CHLORAPREP W/TINT 26ML (MISCELLANEOUS) ×2 IMPLANT
CLAMP CORD UMBIL (MISCELLANEOUS) IMPLANT
CLOTH BEACON ORANGE TIMEOUT ST (SAFETY) ×2 IMPLANT
DRAPE C SECTION CLR SCREEN (DRAPES) IMPLANT
DRSG OPSITE POSTOP 4X10 (GAUZE/BANDAGES/DRESSINGS) ×2 IMPLANT
ELECT REM PT RETURN 9FT ADLT (ELECTROSURGICAL) ×2
ELECTRODE REM PT RTRN 9FT ADLT (ELECTROSURGICAL) ×1 IMPLANT
EXTRACTOR VACUUM M CUP 4 TUBE (SUCTIONS) IMPLANT
GLOVE BIO SURGEON STRL SZ7.5 (GLOVE) ×2 IMPLANT
GLOVE BIOGEL PI IND STRL 7.0 (GLOVE) ×1 IMPLANT
GLOVE BIOGEL PI INDICATOR 7.0 (GLOVE) ×1
GOWN STRL REUS W/TWL 2XL LVL3 (GOWN DISPOSABLE) ×2 IMPLANT
GOWN STRL REUS W/TWL LRG LVL3 (GOWN DISPOSABLE) ×4 IMPLANT
KIT ABG SYR 3ML LUER SLIP (SYRINGE) IMPLANT
NEEDLE HYPO 22GX1.5 SAFETY (NEEDLE) ×2 IMPLANT
NEEDLE HYPO 25X5/8 SAFETYGLIDE (NEEDLE) IMPLANT
NS IRRIG 1000ML POUR BTL (IV SOLUTION) ×2 IMPLANT
PACK C SECTION WH (CUSTOM PROCEDURE TRAY) ×2 IMPLANT
PAD ABD 7.5X8 STRL (GAUZE/BANDAGES/DRESSINGS) ×2 IMPLANT
PAD OB MATERNITY 4.3X12.25 (PERSONAL CARE ITEMS) ×2 IMPLANT
PENCIL SMOKE EVAC W/HOLSTER (ELECTROSURGICAL) ×2 IMPLANT
RTRCTR C-SECT PINK 25CM LRG (MISCELLANEOUS) ×2 IMPLANT
SPONGE GAUZE 4X4 12PLY STER LF (GAUZE/BANDAGES/DRESSINGS) ×2 IMPLANT
STRIP CLOSURE SKIN 1/2X4 (GAUZE/BANDAGES/DRESSINGS) ×2 IMPLANT
SUT CHROMIC 1 CTX 36 (SUTURE) ×4 IMPLANT
SUT PLAIN 2 0 (SUTURE) ×1
SUT PLAIN ABS 2-0 CT1 27XMFL (SUTURE) ×1 IMPLANT
SUT VIC AB 0 CT1 27 (SUTURE) ×1
SUT VIC AB 0 CT1 27XBRD ANBCTR (SUTURE) ×1 IMPLANT
SUT VIC AB 1 CT1 36 (SUTURE) ×4 IMPLANT
SUT VIC AB 2-0 CT1 (SUTURE) ×2 IMPLANT
SUT VIC AB 2-0 CT1 27 (SUTURE) ×1
SUT VIC AB 2-0 CT1 TAPERPNT 27 (SUTURE) ×1 IMPLANT
SUT VIC AB 3-0 CT1 27 (SUTURE) ×2
SUT VIC AB 3-0 CT1 TAPERPNT 27 (SUTURE) ×2 IMPLANT
SUT VIC AB 3-0 SH 27 (SUTURE)
SUT VIC AB 3-0 SH 27X BRD (SUTURE) IMPLANT
SUT VIC AB 4-0 KS 27 (SUTURE) ×4 IMPLANT
SYR BULB IRRIGATION 50ML (SYRINGE) IMPLANT
TOWEL OR 17X24 6PK STRL BLUE (TOWEL DISPOSABLE) ×2 IMPLANT
TRAY FOLEY W/BAG SLVR 14FR LF (SET/KITS/TRAYS/PACK) ×2 IMPLANT
WATER STERILE IRR 1000ML POUR (IV SOLUTION) ×2 IMPLANT

## 2020-08-21 NOTE — Discharge Summary (Signed)
Postpartum Discharge Summary     Patient Name: Tara Mcmahon DOB: 18-Dec-1995 MRN: [id number]  Date of admission: 08/20/2020 Delivery date:08/21/2020  Delivering provider: Woodroe Mode  Date of discharge: 08/23/2020  Admitting diagnosis: Preeclampsia, severe, third trimester [O14.13] Intrauterine pregnancy: [redacted]w[redacted]d    Secondary diagnosis:  Principal Problem:   Cesarean delivery delivered Active Problems:   Depression with anxiety   History of suicidal ideation   Subclinical hypothyroidism   History of PCOS   History of cervical incompetence in pregnancy, currently pregnant   History of preterm delivery, currently pregnant   Rubella non-immune status, antepartum   History of gestational hypertension   Preeclampsia, severe, third trimester  Additional problems: as noted above   Discharge diagnosis:  Primary Cesarean delivery Post partum procedures:none Augmentation: Pitocin and Cytotec Complications: None  Hospital course: Induction of Labor With Cesarean Section   24y.o. yo [id number] 324w5das admitted to the hospital 08/20/2020 for induction of labor. Patient had a labor course significant for NRFHTs remote from delivery. The patient went for cesarean section due to secondary to NFHTs remote from delivery (tight nuchal visualized). Delivery details are as follows: Membrane Rupture Time/Date: 8:37 AM ,08/21/2020   Delivery Method:C-Section, Low Transverse  Details of operation can be found in separate operative Note. Patient received Magnesium sulfate x 24 hours. Her blood pressures were within range.  She is ambulating, tolerating a regular diet, passing flatus, and urinating well.  Patient is discharged home in stable condition on 08/23/20.      Newborn Data: Birth date:08/21/2020  Birth time:8:38 AM  Gender:Female  Living status:Living  Apgars:8 ,8  Weight:3160 g                                 Magnesium Sulfate received: Yes: Seizure prophylaxis BMZ received:  No Rhophylac:N/A MMR:N/A T-DaP:Given prenatally Flu: No Transfusion:No  Physical exam  Vitals:   08/22/20 2107 08/22/20 2257 08/23/20 0505 08/23/20 0732  BP: 122/73 112/74 118/61 105/70  Pulse: 95 87 81 67  Resp: _0 Temp: 98 F (36.7 C) 98 F (36.7 C) 98.4 F (36.9 C) 98.4 F (36.9 C)  TempSrc: Oral Oral Oral Oral  SpO2: 100% 100% 99% 99%  Weight:      Height:       General: alert, cooperative and no distress Lochia: appropriate Uterine Fundus: firm Incision: Healing well with no significant drainage DVT Evaluation: No evidence of DVT seen on physical exam. Labs: Lab Results  Component Value Date   WBC 12.1 (H) 08/22/2020   HGB 7.5 (L) 08/22/2020   HCT 23.1 (L) 08/22/2020   MCV 84.3 08/22/2020   PLT 304 08/22/2020   CMP Latest Ref Rng & Units 08/22/2020  Glucose 70 - 99 mg/dL 95  BUN 6 - 20 mg/dL <5(L)  Creatinine 0.44 - 1.00 mg/dL 0.67  Sodium 135 - 145 mmol/L 133(L)  Potassium 3.5 - 5.1 mmol/L 3.6  Chloride 98 - 111 mmol/L 101  CO2 22 - 32 mmol/L 23  Calcium 8.9 - 10.3 mg/dL 6.6(L)  Total Protein 6.5 - 8.1 g/dL 4.9(L)  Total Bilirubin 0.3 - 1.2 mg/dL 0.3  Alkaline Phos 38 - 126 U/L 114  AST 15 - 41 U/L 19  ALT 0 - 44 U/L 13   Edinburgh Score: Edinburgh Postnatal Depression Scale Screening Tool 08/21/2020  I have been able to laugh and see the funny side  of things. (No Data)  I have looked forward with enjoyment to things. -  I have blamed myself unnecessarily when things went wrong. -  I have been anxious or worried for no good reason. -  I have felt scared or panicky for no good reason. -  Things have been getting on top of me. -  I have been so unhappy that I have had difficulty sleeping. -  I have felt sad or miserable. -  I have been so unhappy that I have been crying. -  The thought of harming myself has occurred to me. Flavia Shipper Postnatal Depression Scale Total -     After visit meds:  Allergies as of 08/23/2020      Reactions    Shellfish Allergy Anaphylaxis, Swelling   Only Crawfish not allergic to any other shellfish       Medication List    TAKE these medications   aspirin EC 81 MG tablet Take 81 mg by mouth in the morning and at bedtime.   Blood Pressure Monitor Misc For regular home bp monitoring during pregnancy   docusate sodium 100 MG capsule Commonly known as: COLACE Take 1 capsule (100 mg total) by mouth 2 (two) times daily.   multivitamin-prenatal 27-0.8 MG Tabs tablet Take 1 tablet by mouth daily at 12 noon.   oxyCODONE-acetaminophen 5-325 MG tablet Commonly known as: PERCOCET/ROXICET Take 1-2 tablets by mouth every 6 (six) hours as needed.       Discharge home in stable condition Infant Feeding: Bottle Infant Disposition:home with mother Discharge instruction: per After Visit Summary and Postpartum booklet. Activity: Advance as tolerated. Pelvic rest for 6 weeks.  Diet: routine diet Future Appointments: Future Appointments  Date Time Provider [place]  08/27/2020  3:10 PM [facility] NURSE [facility]   Follow up Visit:  Follow-up Information    [facility] Follow up in 1 week(s).   Why: call the office on Monday to schedule a Blood pressure check Contact information: [place] [phone] [phone]             Message sent to [facility] to schedule PP appt on 9/24  Please schedule this patient for a In person postpartum visit in 4 weeks with the following provider: Any provider. Additional Postpartum F/U:Postpartum Depression checkup in 1 week, Incision check 1 week and BP check 2-3 days  High risk pregnancy complicated by: preeclampsia with severe features, anxiety/depression, rubella non-immune, h/o preterm delivery Delivery mode:  C-Section, Low Transverse  Anticipated Birth Control:  plan for outpatient IUD   08/23/2020 Donnamae Jude, MD

## 2020-08-21 NOTE — Lactation Note (Signed)
This note was copied from a baby's chart. Lactation Consultation Note  Patient Name: Tara Mcmahon MVHQI'[id number] Date: 08/21/2020 Reason for consult: Mother's request;Follow-up assessment P4, 11 hour ETI female infant. Per mom, requested assistance with latch, infant has been sleepy almost 4 hours since last feeding. LC entered room, mom hold infant swaddled in blankets, infant asleep. LC discussed hand expression, mom choice is not to use DEBP due to past  hx anxiety with using a breast pump. Mom was open to doing hand expression, LC assisted mom and mom easily expressed 3 mls of colostrum in a spoon. Infant was undressed and became more alert after  taking the of colostrum and started rooting and cuing to breastfeed. Mom latched infant on her left breast using the football hold position, infant was still BF after 10 minutes with deep latch when LC left the room. Mom understands to BF infant by cues, 8 to 12+ times within 24 hours and to BF infant STS, doing stimulation to keep infant awake at the breast such as: talking to infant, breast compression and gently stroking infant's neck and shoulders.  LC informed mom she can hand expressed after latching infant at breast to give infant extra volume since she chooses not to pump.  LC explained the benefits of hand expression in help to establish mom's milk supply due to her hx of : GHTN, PCOS, MgSo4 and C/S delivery. Mom knows to call RN or LC if she has any further questions, concerns or needs assistance with latching infant at the breast.     Maternal Data    Feeding Feeding Type: Breast Fed  LATCH Score Latch: Grasps breast easily, tongue down, lips flanged, rhythmical sucking.  Audible Swallowing: Spontaneous and intermittent  Type of Nipple: Everted at rest and after stimulation  Comfort (Breast/Nipple): Soft / non-tender  Hold (Positioning): Assistance needed to correctly position infant at breast and maintain  latch.  LATCH Score: 9  Interventions Interventions: Skin to skin;Breast compression;Hand express;Breast massage;Assisted with latch;Position options;Support pillows;Adjust position;Expressed milk  Lactation Tools Discussed/Used     Consult Status Consult Status: Follow-up Date: 08/22/20 Follow-up type: In-patient    Danelle Earthly 08/21/2020, 8:36 PM

## 2020-08-21 NOTE — Plan of Care (Signed)
  Problem: Education: Goal: Knowledge of General Education information will improve Description Including pain rating scale, medication(s)/side effects and non-pharmacologic comfort measures Outcome: Progressing   Problem: Health Behavior/Discharge Planning: Goal: Ability to manage health-related needs will improve Outcome: Progressing   

## 2020-08-21 NOTE — Op Note (Signed)
Tara Mcmahon PROCEDURE DATE: 08/21/2020  PREOPERATIVE DIAGNOSES: Intrauterine pregnancy at [redacted]w[redacted]d weeks gestation; non-reassuring fetal status in the setting of preeclampsia with severe features  POSTOPERATIVE DIAGNOSES: The same, Tight nuchal visualized at time of delivery  PROCEDURE: Primary Low Transverse Cesarean Section  SURGEON:  Dr. Scheryl Darter   Dr. Lynnda Shields  ANESTHESIOLOGY TEAM: Anesthesiologist: Vira Agar, MD CRNA: Graciela Husbands, CRNA; Rhymer, Doree Fudge, CRNA  INDICATIONS: Tara Mcmahon is a 24 y.o. [phone] at [redacted]w[redacted]d here for cesarean section secondary to the indications listed under preoperative diagnoses; please see preoperative note for further details.  The risks of surgery were discussed with the patient including but were not limited to: bleeding which may require transfusion or reoperation; infection which may require antibiotics; injury to bowel, blaegnancies; incisional problems; thromboembolic phenomenon and other postdder, ureters or other surrounding organs; injury to the fetus; need for additional procedures including hysterectomy in the event of a life-threatening hemorrhage; formation of adhesions; placental abnormalities wth subsequent properative/anesthesia complications.  The patient concurred with the proposed plan, giving informed written consent for the procedure.    FINDINGS:  Viable female infant in cephalic presentation.  Apgars 8 and 8.  Clear amniotic fluid.  Intact placenta, three vessel cord.  Normal uterus, fallopian tubes and ovaries bilaterally.  ANESTHESIA: Epidural  INTRAVENOUS FLUIDS: 1500 ml   ESTIMATED BLOOD LOSS: 341 ml URINE OUTPUT:  725 ml SPECIMENS: Placenta sent to L&D, arterial cord gas pH 7.18 COMPLICATIONS: None immediate  PROCEDURE IN DETAIL:  The patient preoperatively received intravenous antibiotics and had sequential compression devices applied to her lower extremities.  She was then  taken to the operating room where the epidural anesthesia was dosed up to surgical level and was found to be adequate. She was then placed in a dorsal supine position with a leftward tilt, and prepped and draped in a sterile manner.  A foley catheter was placed into her bladder and attached to constant gravity.  After an adequate timeout was performed, a Pfannenstiel skin incision was made with scalpel and carried through to the underlying layer of fascia. The fascia was incised in the midline and extended bluntly.  Kocher clamps were applied to the superior aspect of the fascial incision and the underlying rectus muscles were dissected off bluntly and sharply.  The rectus muscles were separated in the midline and the peritoneum was entered bluntly. The Alexis self-retaining retractor was introduced into the abdominal cavity.  Attention was turned to the lower uterine segment where a low transverse hysterotomy was made with a scalpel and extended bilaterally bluntly.  The infant was successfully delivered, the cord was clamped immediately given NRFHTs, and the infant was handed over to the awaiting neonatology team. Uterine massage was then administered, and the placenta delivered intact with a three-vessel cord. The uterus was then cleared of clots and debris.  The hysterotomy was closed with 0 Vicryl in a running locked fashion, and an imbricating layer was also placed with 0 Vicryl. The pelvis was cleared of all clot and debris. Hemostasis was confirmed on all surfaces.  The retractor was removed.  The peritoneum was closed with a 0 Vicryl running stitch. The fascia was then closed using 0 Vicryl in a running fashion.  The subcutaneous layer was irrigated, reapproximated with 2-0 plain gut interrupted stitches, and the skin was closed with a 4-0 Vicryl subcuticular stitch. The patient tolerated the procedure well. Sponge, instrument and needle counts were correct x 3.  She was taken to the recovery room in stable  condition.   Sheila Oats, MD OB Fellow, Faculty Practice 08/21/2020 9:49 AM

## 2020-08-21 NOTE — Progress Notes (Signed)
LABOR PROGRESS NOTE  SHIASIA PORRO is a 24 y.o. [phone] at [redacted]w[redacted]d  admitted for IOL due to pre-e with severe features.   Subjective: Just received epidural. Feeling nauseous with NBNB emesis x1.   Objective: BP 111/63   Pulse 98   Temp 98 F (36.7 C) (Oral)   Resp 18   Ht 4\' 11"  (1.499 m)   Wt 91.9 kg   LMP 11/12/2019 (Exact Date)   SpO2 100%   BMI 40.92 kg/m  or  Vitals:   08/21/20 0103 08/21/20 0105 08/21/20 0110 08/21/20 0115  BP: (!) 133/93 128/78 127/74 111/63  Pulse: (!) 103 (!) 105 (!) 113 98  Resp:    18  Temp:      TempSrc:      SpO2:  96% 100%   Weight:      Height:        Dilation: 4 Effacement (%): 50 Cervical Position: Posterior Station: -3 Presentation: Vertex Exam by:: [email or web address] RN FHT: baseline rate 140, moderate varibility, +acel, variable decel Toco: every 3-7 min   Labs: Lab Results  Component Value Date   WBC 10.2 08/20/2020   HGB 10.9 (L) 08/20/2020   HCT 33.4 (L) 08/20/2020   MCV 83.1 08/20/2020   PLT 350 08/20/2020    Patient Active Problem List   Diagnosis Date Noted  . Preeclampsia, severe, third trimester 08/20/2020  . Supervision of high risk pregnancy, antepartum 06/05/2020  . History of gestational hypertension 02/26/2020  . Encounter for supervision of other normal pregnancy, unspecified trimester 02/25/2020  . Rubella non-immune status, antepartum 12/14/2017  . History of cervical incompetence in pregnancy, currently pregnant 11/23/2017  . History of preterm delivery, currently pregnant 11/23/2017  . History of PCOS 06/23/2017  . Subclinical hypothyroidism 02/04/2016  . History of suicidal ideation 02/03/2016  . Depression with anxiety 01/27/2016  . Family history of systemic lupus erythematosus (SLE) in mother 01/27/2016    Assessment / Plan: 64 y.o. 30 at [redacted]w[redacted]d here for IOL due to pre-e with severe features.   Labor: Progressing well, s/p cytotec x1 and started pit 2x2 at 0046. Will titrate as  tolerated Fetal Wellbeing: Cat II with recent deep variable after epidural placed, with resolution after switching positions, will additionally give NS bolus.  Pain Control:  Epidural just placed  Anticipated MOD:  SVD  #Pre-e with severe features:  HA/blurry vision resolved. Currently on magnesium, UOP appropriate. No severe range. PEC labs wnl.  --Monitor BP --Cont Mag   [redacted]w[redacted]d, DO  Family Medicine PGY-3  08/21/2020, 1:39 AM

## 2020-08-21 NOTE — Progress Notes (Addendum)
Called to pt's room given recurrent decels to 50s without improvement s/p discontinuation of pitocin and dose of terbutaline with maternal repositioning and IVF bolus. Recheck of cervical exam unchanged from prior (5cm). Given remote from delivery with NRFHTs, consented for cesarean.   The risks of cesarean section were discussed with the patient including but were not limited to: bleeding which may require transfusion or reoperation; infection which may require antibiotics; injury to bowel, bladder, ureters or other surrounding organs; injury to the fetus; need for additional procedures including hysterectomy in the event of a life-threatening hemorrhage; placental abnormalities wth subsequent pregnancies, incisional problems, thromboembolic phenomenon and other postoperative/anesthesia complications.  The patient concurred with the proposed plan, giving informed written consent for the procedure.  Patient has been NPO since yesterday evening; she will remain NPO for procedure. Anesthesia and OR aware.  Preoperative prophylactic antibiotics and SCDs ordered on call to the OR.  To OR when ready.  Sheila Oats, MD OB Fellow, Faculty Practice 08/21/2020 8:29 AM  Attestation of Attending Supervision of Obstetric Fellow: Evaluation and management procedures were performed by the Obstetric Fellow under my supervision and collaboration.  I have reviewed the Obstetric Fellow's note and chart, and I agree with the management and plan. I was present and counseled the patient.  Scheryl Darter, MD, FACOG Attending Obstetrician & Gynecologist Faculty Practice, [facility]

## 2020-08-21 NOTE — Anesthesia Procedure Notes (Signed)
Epidural Patient location during procedure: OB Start time: 08/21/2020 12:56 AM End time: 08/21/2020 1:06 AM  Staffing Anesthesiologist: Lannie Fields, DO Performed: anesthesiologist   Preanesthetic Checklist Completed: patient identified, IV checked, risks and benefits discussed, monitors and equipment checked, pre-op evaluation and timeout performed  Epidural Patient position: sitting Prep: DuraPrep and site prepped and draped Patient monitoring: continuous pulse ox, blood pressure, heart rate and cardiac monitor Approach: midline Location: L3-L4 Injection technique: LOR air  Needle:  Needle type: Tuohy  Needle gauge: 17 G Needle length: 9 cm Needle insertion depth: 7 cm Catheter type: closed end flexible Catheter size: 19 Gauge Catheter at skin depth: 12 cm Test dose: negative  Assessment Sensory level: T8 Events: blood not aspirated, injection not painful, no injection resistance, no paresthesia and negative IV test  Additional Notes Patient identified. Risks/Benefits/Options discussed with patient including but not limited to bleeding, infection, nerve damage, paralysis, failed block, incomplete pain control, headache, blood pressure changes, nausea, vomiting, reactions to medication both or allergic, itching and postpartum back pain. Confirmed with bedside nurse the patient's most recent platelet count. Confirmed with patient that they are not currently taking any anticoagulation, have any bleeding history or any family history of bleeding disorders. Patient expressed understanding and wished to proceed. All questions were answered. Sterile technique was used throughout the entire procedure. Please see nursing notes for vital signs. Test dose was given through epidural catheter and negative prior to continuing to dose epidural or start infusion. Warning signs of high block given to the patient including shortness of breath, tingling/numbness in hands, complete motor  block, or any concerning symptoms with instructions to call for help. Patient was given instructions on fall risk and not to get out of bed. All questions and concerns addressed with instructions to call with any issues or inadequate analgesia.  Reason for block:procedure for pain

## 2020-08-21 NOTE — Progress Notes (Signed)
LABOR PROGRESS NOTE  Tara Mcmahon is a 24 y.o. [phone] at [redacted]w[redacted]d  admitted for IOL due to pre-e with severe features.   Subjective: Doing well, getting rest.   Objective: BP 117/79   Pulse 86   Temp 97.6 F (36.4 C) (Oral)   Resp 17   Ht 4\' 11"  (1.499 m)   Wt 91.9 kg   LMP 11/12/2019 (Exact Date)   SpO2 98%   BMI 40.92 kg/m  or  Vitals:   08/21/20 0400 08/21/20 0430 08/21/20 0500 08/21/20 0530  BP: 104/70 97/63 91/69  117/79  Pulse: 81 83 74 86  Resp: 16 16 16 17   Temp:    97.6 F (36.4 C)  TempSrc:    Oral  SpO2:      Weight:      Height:        Dilation: 5 Effacement (%): 60 Cervical Position: Posterior Station: -2 Presentation: Vertex Exam by:: Dr. FHT: baseline rate 120, moderate varibility, +acel, -decel Toco: every 2-4 min   Labs: Lab Results  Component Value Date   WBC 10.2 08/20/2020   HGB 10.9 (L) 08/20/2020   HCT 33.4 (L) 08/20/2020   MCV 83.1 08/20/2020   PLT 350 08/20/2020    Patient Active Problem List   Diagnosis Date Noted  . Preeclampsia, severe, third trimester 08/20/2020  . Supervision of high risk pregnancy, antepartum 06/05/2020  . History of gestational hypertension 02/26/2020  . Encounter for supervision of other normal pregnancy, unspecified trimester 02/25/2020  . Rubella non-immune status, antepartum 12/14/2017  . History of cervical incompetence in pregnancy, currently pregnant 11/23/2017  . History of preterm delivery, currently pregnant 11/23/2017  . History of PCOS 06/23/2017  . Subclinical hypothyroidism 02/04/2016  . History of suicidal ideation 02/03/2016  . Depression with anxiety 01/27/2016  . Family history of systemic lupus erythematosus (SLE) in mother 01/27/2016    Assessment / Plan: 2 y.o. [birth year]28/2018 at [redacted]w[redacted]d here for IOL due to pre-e with severe features.   Labor: Progressing well, will continue to titrate pit as tolerated. Possible AROM on next check, would like head to become more applied.  Fetal  Wellbeing: Cat 1 strip  Pain Control:  Epidural in place  Anticipated MOD:  SVD  #Pre-e with severe features:  BP wnl currently, no severe range. UOP appropriate with IV Mag. Pre-e labs wnl.  --Monitor BP --Cont IV Mg   [id number], DO  Family Medicine PGY-3  08/21/2020, 5:55 AM

## 2020-08-21 NOTE — Lactation Note (Signed)
This note was copied from a baby's chart. Lactation Consultation Note  Patient Name: Tara Mcmahon [id number] Date: 08/21/2020 Reason for consult: Initial assessment;Early term 37-38.6wks;Maternal endocrine disorder Type of Endocrine Disorder?: PCOS (subclinical hypothyroidism)  MgSO4 for GHTN, C/Section, severe anxiety  LC in to visit with P4 Mom of ET infant at 26 hrs old.  Baby swaddled in Mom's arms.    Mom has a varied breastfeeding history.  Mom has PCOS and has struggled with her milk supply for each baby except for the first who was born at 84 weeks.  Mom states she pumped with a hospital grade DEBP and had an abundant milk supply.  Mom quit after 8 weeks due to exhaustion and anxiety.    Assisted Mom to place baby STS on her chest.  Reinforced importance of STS for encouraging more breastfeeding, and to stimulate her milk supply.  Baby has breastfed well (per Mom as she noted deep jaw extensions and swallowing) twice since birth.    Talked with Mom about double pumping after baby breastfeeds to increase stimulation for her milk supply.  Mom explained very calmly how pumping causes an anxiety reaction in her due to history of infant in NICU.  Pump at bedside, kit in room unopened.  Validated Mom's feelings and respected her wishes.  If Mom changes her mind and would like to add pumping to the plan, Mom will ask her RN to set up the DEBP.    Mom has a Spectra 2 DEBP at home.  Mom also has [facility] in [place], did not fax referral as Mom declines pumping.  Mom to keep baby STS as much as possible and watch baby for feeding cues.  Mom to ask for help prn.  Lactation brochure left in room.  Mom aware of IP and OP lactation support and will call prn   Interventions Interventions: Breast feeding basics reviewed;Skin to skin;Breast massage;Hand express  Lactation Tools Discussed/Used WIC Program: Yes   Consult Status Consult Status: Follow-up Date: 08/22/20 Follow-up type:  In-patient    Broadus John 08/21/2020, 2:41 PM

## 2020-08-21 NOTE — Anesthesia Preprocedure Evaluation (Signed)
Anesthesia Evaluation  Patient identified by MRN, date of birth, ID band Patient awake    Reviewed: Allergy & Precautions, Patient's Chart, lab work & pertinent test results  Airway Mallampati: II  TM Distance: >3 FB Neck ROM: Full    Dental no notable dental hx.    Pulmonary neg pulmonary ROS,    Pulmonary exam normal breath sounds clear to auscultation       Cardiovascular hypertension (PIH no meds), Normal cardiovascular exam Rhythm:Regular Rate:Normal     Neuro/Psych PSYCHIATRIC DISORDERS Anxiety Depression negative neurological ROS     GI/Hepatic negative GI ROS, Neg liver ROS,   Endo/Other  Hypothyroidism Morbid obesityBMI 41  Renal/GU negative Renal ROS  Female GU complaint (PCOS)     Musculoskeletal negative musculoskeletal ROS (+)   Abdominal (+) + obese,   Peds negative pediatric ROS (+)  Hematology  (+) Blood dyscrasia, anemia , hct 33.4, plt 350   Anesthesia Other Findings   Reproductive/Obstetrics (+) Pregnancy G5P3 has had epidural before, had issue once with spinal not working 8 years ago for emergency cerclage and has some PTSD 2/2 this   preE with severe features (HA, vision changes, mildly elevated Bps that have not needed treatment)                            Anesthesia Physical Anesthesia Plan  ASA: III and emergent  Anesthesia Plan: Epidural   Post-op Pain Management:    Induction:   PONV Risk Score and Plan: 2  Airway Management Planned: Natural Airway  Additional Equipment: None  Intra-op Plan:   Post-operative Plan:   Informed Consent: I have reviewed the patients History and Physical, chart, labs and discussed the procedure including the risks, benefits and alternatives for the proposed anesthesia with the patient or authorized representative who has indicated his/her understanding and acceptance.       Plan Discussed with:   Anesthesia Plan  Comments:         Anesthesia Quick Evaluation

## 2020-08-21 NOTE — Discharge Instructions (Signed)

## 2020-08-21 NOTE — Transfer of Care (Signed)
Immediate Anesthesia Transfer of Care Note  Patient: Tara Mcmahon  Procedure(s) Performed: CESAREAN SECTION (N/A )  Patient Location: PACU  Anesthesia Type:Epidural  Level of Consciousness: awake, alert  and oriented  Airway & Oxygen Therapy: Patient Spontanous Breathing  Post-op Assessment: Report given to RN and Post -op Vital signs reviewed and stable  Post vital signs: Reviewed and stable  Last Vitals:  Vitals Value Taken Time  BP 115/60 08/21/20 0954  Temp    Pulse 107 08/21/20 1000  Resp 16 08/21/20 1000  SpO2 100 % 08/21/20 0959  Vitals shown include unvalidated device data.  Last Pain:  Vitals:   08/21/20 0715  TempSrc:   PainSc: 4          Complications: No complications documented.

## 2020-08-22 LAB — COMPREHENSIVE METABOLIC PANEL
ALT: 13 U/L (ref 0–44)
AST: 19 U/L (ref 15–41)
Albumin: 1.9 g/dL — ABNORMAL LOW (ref 3.5–5.0)
Alkaline Phosphatase: 114 U/L (ref 38–126)
Anion gap: 9 (ref 5–15)
BUN: <5 mg/dL — ABNORMAL LOW (ref 6–20)
CO2: 23 mmol/L (ref 22–32)
Calcium: 6.6 mg/dL — ABNORMAL LOW (ref 8.9–10.3)
Chloride: 101 mmol/L (ref 98–111)
Creatinine, Ser: 0.67 mg/dL (ref 0.44–1.00)
GFR calc Af Amer: >60 mL/min (ref >60)
GFR calc non Af Amer: >60 mL/min (ref >60)
Glucose, Bld: 95 mg/dL (ref 70–99)
Potassium: 3.6 mmol/L (ref 3.5–5.1)
Sodium: 133 mmol/L — ABNORMAL LOW (ref 135–145)
Total Bilirubin: 0.3 mg/dL (ref 0.3–1.2)
Total Protein: 4.9 g/dL — ABNORMAL LOW (ref 6.5–8.1)

## 2020-08-22 LAB — CBC
HCT: 23.1 % — ABNORMAL LOW (ref 36.0–46.0)
Hemoglobin: 7.5 g/dL — ABNORMAL LOW (ref 12.0–15.0)
MCH: 27.4 pg (ref 26.0–34.0)
MCHC: 32.5 g/dL (ref 30.0–36.0)
MCV: 84.3 fL (ref 80.0–100.0)
Platelets: 304 10*3/uL (ref 150–400)
RBC: 2.74 MIL/uL — ABNORMAL LOW (ref 3.87–5.11)
RDW: 13.2 % (ref 11.5–15.5)
WBC: 12.1 10*3/uL — ABNORMAL HIGH (ref 4.0–10.5)
nRBC: 0 % (ref 0.0–0.2)

## 2020-08-22 MED ORDER — POLYETHYLENE GLYCOL 3350 17 G PO PACK
17.0000 g | PACK | Freq: Every day | ORAL | Status: DC
  Filled 2020-08-22: qty 1

## 2020-08-22 NOTE — Lactation Note (Signed)
This note was copied from a baby's chart. Lactation Consultation Note  Patient Name: Tara Mcmahon [id number] Date: 08/22/2020 Reason for consult: Follow-up assessment;Early term 37-38.6wks;Infant weight loss;Maternal endocrine disorder Type of Endocrine Disorder?: PCOS (Hypothyroidism/ PCOS)  Mom hx PIH and Anemia  Infant is 37 weeks 28 hours old with a 7% weight loss. Mom is not interested in using DEBP in the hospital to increase stimulation. She has a Spectra 2 at home that is hospital grade and she will start pumping on discharge.  Mom states nursing is going well. Last feeding was at 11 am for 20 minutes.  Infant had 3 urine and 5 stool since birth. During visit, LC changed diaper with urine/ stool.   LC assisted Mom latching infant in football on the right side.  Mom has decided to supplement with Enfamil. LC reviewed breastfeeding supplementation guidelines for the LPTI. We also reviewed behaviors to follow to decrease calorie loss keeping infant STS, wearing a hat, and keeping feeding times under 30 minutes.   Plan 1. To feed infant based on cues 8-12x in 24 hour period no more than 3 hours without an attempt.         2. Mom to supplement 10- 20 ml with Enfamil neuro pro with Iron and to increase as tolerated based on guideline hours after birth.         3. Mom knows to call RN or LC if she needs assistance.

## 2020-08-22 NOTE — Progress Notes (Signed)
Subjective:no HA or scotomata Postpartum Day 1: Cesarean Delivery Patient reports incisional pain and tolerating PO.    Objective: Vital signs in last 24 hours: Temp:  [97.5 F (36.4 C)-98.7 F (37.1 C)] 97.5 F (36.4 C) (09/25 0500) Pulse Rate:  [80-112] 80 (09/25 0500) Resp:  [16-23] 18 (09/25 0600) BP: (98-113)/(52-78) 103/71 (09/25 0500) SpO2:  [96 %-100 %] 99 % (09/25 0500)  Physical Exam:  General: alert, cooperative and no distress Lochia: appropriate Uterine Fundus: firm Incision: no significant drainage DVT Evaluation: No evidence of DVT seen on physical exam.  Recent Labs    08/20/20 2001 08/22/20 0331  HGB 10.9* 7.5*  HCT 33.4* 23.1*   CMP Latest Ref Rng & Units 08/22/2020 08/20/2020 08/19/2020  Glucose 70 - 99 mg/dL 95 91 89  BUN 6 - 20 mg/dL <8(C) 8 5(L)  Creatinine 0.44 - 1.00 mg/dL 1.66 0.63 0.16  Sodium 135 - 145 mmol/L 133(L) 136 134(L)  Potassium 3.5 - 5.1 mmol/L 3.6 3.9 4.2  Chloride 98 - 111 mmol/L 101 106 105  CO2 22 - 32 mmol/L 23 19(L) 20(L)  Calcium 8.9 - 10.3 mg/dL 6.6(L) 9.0 8.8(L)  Total Protein 6.5 - 8.1 g/dL 4.9(L) 6.5 6.5  Total Bilirubin 0.3 - 1.2 mg/dL 0.3 0.6 0.5  Alkaline Phos 38 - 126 U/L 114 153(H) 141(H)  AST 15 - 41 U/L 19 20 18   ALT 0 - 44 U/L 13 18 14       Assessment/Plan: Status post Cesarean section. Postoperative course complicated by blood loss at surgery and Preeclampsia  Continue current care. Magnesium to d/c this AM  08/22/2020, 6:58 AM

## 2020-08-22 NOTE — Anesthesia Postprocedure Evaluation (Signed)
Anesthesia Post Note  Patient: Tara Mcmahon  Procedure(s) Performed: CESAREAN SECTION (N/A )     Patient location during evaluation: Mother Baby Anesthesia Type: Epidural Level of consciousness: awake and alert Pain management: pain level controlled Vital Signs Assessment: post-procedure vital signs reviewed and stable Respiratory status: spontaneous breathing, nonlabored ventilation and respiratory function stable Cardiovascular status: stable Postop Assessment: no headache, no backache and epidural receding Anesthetic complications: no   No complications documented.  Last Vitals:  Vitals:   08/22/20 0700 08/22/20 0739  BP:  104/70  Pulse:  74  Resp: 17 18  Temp:  36.4 C  SpO2:  98%    Last Pain:  Vitals:   08/22/20 0739  TempSrc: Oral  PainSc:    Pain Goal:                   Jourdin Gens

## 2020-08-22 NOTE — Progress Notes (Signed)
CSW received consult for hx of Anxiety and Depression.  CSW met with [name] at bedside to offer support and complete assessment. On arrival, CSW introduced self and stated reason for visit. [name] was welcoming, pleasant, and engaged. Infant, [place] was present and in [name] arms during visit.   During assessment, [name] confirmed depression and anxiety dx. [name] stated sx such as lack of interest and energy, and  tearfulness have been managed through counseling. [name] reports seeing therapist Evlyn Courier at [facility] since [redacted] weeks pregnant. [name] reports next visit Tuesday, 08/25/20. [name] reported previous Rx for Zoloft a year ago. [name] stated sx have been managed without Rx. [name] reported current mood as "good" due to support from staff and doula during delivery. [name] denied any SI, HI, or domestic violence. [name] identified FOB, parents, friends, and therapist as support. [name] declined any additional support or resources.   CSW provided education regarding the baby blues period vs. perinatal mood disorders, discussed treatment and gave resources for mental health follow up if concerns arise.  CSW recommends self-evaluation during the postpartum time period using the New Mom Checklist from Postpartum Progress and encouraged [name] to contact a medical professional if symptoms are noted at any time. [name] stated understanding and denied any questions. [name] reported postpartum depression after birth of first child, however, relates it baby being born at 71 weeks.    CSW provided review of Sudden Infant Death Syndrome (SIDS) precautions. [name] stated understanding and denied any questions. [name] confirmed having all needed items including car seat and crib for baby's safe sleep.     CSW identifies no further need for intervention and no barriers to discharge at this time.  Kennard Fildes D. Lissa Morales, MSW, [facility] Clinical Social Worker [phone]

## 2020-08-23 MED ORDER — OXYCODONE-ACETAMINOPHEN 5-325 MG PO TABS: 1.0000 | ORAL_TABLET | Freq: Four times a day (QID) | ORAL | 0 refills | Status: DC | PRN

## 2020-08-23 NOTE — Progress Notes (Signed)
Mother has chooses to formula feed only due to mothers thoughts about newborn having acid reflux.  Mother states "in my experience breastmilk is so thin it makes reflux worse."  Education on acid reflux offered, mother declines any education at this time.  Mother educated on how to protect breasts if not breastfeeding.  Mother states she already knows what to do.

## 2020-08-27 ENCOUNTER — Telehealth (INDEPENDENT_AMBULATORY_CARE_PROVIDER_SITE_OTHER): Payer: Medicaid Other | Admitting: *Deleted

## 2020-08-27 DIAGNOSIS — Z0130 Encounter for examination of blood pressure without abnormal findings: Secondary | ICD-10-CM

## 2020-08-27 NOTE — Progress Notes (Addendum)
I connected with  Tara Mcmahon on 08/27/20 by a video enabled telemedicine application and verified that I am speaking with the correct person using two identifiers.   I discussed the limitations of evaluation and management by telemedicine. The patient expressed understanding and agreed to proceed.   NURSE VISIT- BLOOD PRESSURE CHECK  SUBJECTIVE:  Tara Mcmahon is a 24 y.o. [phone] female here for BP check. She is postpartum, delivery date 08/21/20    HYPERTENSION ROS:  Postpartum:  . Severe headaches that don't go away with tylenol/other medicines: No  . Visual changes (seeing spots/double/blurred vision) No  . Severe pain under right breast breast or in center of upper chest No  . Severe nausea/vomiting No  . Taking medicines as instructed not applicable  .   OBJECTIVE:  BP 117/75 (BP Location: Right Arm, Patient Position: Sitting, Cuff Size: Normal)   Pulse 100   Breastfeeding Yes   Appearance alert, well appearing, and in no distress and oriented to person, place, and time.  ASSESSMENT: Postpartum  blood pressure check  PLAN: Discussed with Dr. Despina Hidden   Recommendations: no changes needed   Follow-up: incision check   Jobe Marker  08/27/2020 3:23 PM   Attestation of Attending Supervision of Nursing Visit Encounter: Evaluation and management procedures were performed by the nursing staff under my supervision and collaboration.  I have reviewed the nurse's note and chart, and I agree with the management and plan.  Rockne Coons MD Attending Physician for the Center for [facility] 09/16/2020 8:42 PM

## 2020-09-08 ENCOUNTER — Telehealth (INDEPENDENT_AMBULATORY_CARE_PROVIDER_SITE_OTHER): Payer: Medicaid Other | Admitting: Obstetrics & Gynecology

## 2020-09-08 DIAGNOSIS — Z98891 History of uterine scar from previous surgery: Secondary | ICD-10-CM

## 2020-09-08 MED ORDER — SERTRALINE HCL 50 MG PO TABS: 50.0000 mg | ORAL_TABLET | Freq: Every day | ORAL | 5 refills | Status: DC

## 2020-09-08 NOTE — Progress Notes (Signed)
  HPI: Patient returns for routine postoperative follow-up having undergone emergent C section on .  The patient's immediate postoperative recovery has been unremarkable. Since hospital discharge the patient reports .   Current Outpatient Medications: aspirin EC 81 MG tablet, Take 81 mg by mouth in the morning and at bedtime. (Patient not taking: Reported on 08/27/2020), Disp: , Rfl:  Blood Pressure Monitor MISC, For regular home bp monitoring during pregnancy, Disp: 1 each, Rfl: 0 docusate sodium (COLACE) 100 MG capsule, Take 1 capsule (100 mg total) by mouth 2 (two) times daily. (Patient not taking: Reported on 08/20/2020), Disp: 60 capsule, Rfl: 0 oxyCODONE-acetaminophen (PERCOCET/ROXICET) 5-325 MG tablet, Take 1-2 tablets by mouth every 6 (six) hours as needed. (Patient not taking: Reported on 08/27/2020), Disp: 20 tablet, Rfl: 0 Prenatal Vit-Fe Fumarate-FA (MULTIVITAMIN-PRENATAL) 27-0.8 MG TABS tablet, Take 1 tablet by mouth daily at 12 noon. (Patient not taking: Reported on 08/27/2020), Disp: , Rfl:  sertraline (ZOLOFT) 50 MG tablet, Take 1 tablet (50 mg total) by mouth daily., Disp: 30 tablet, Rfl: 5  No current facility-administered medications for this visit.    currently breastfeeding.  Physical Exam: Incision clean dry intact   Diagnostic Tests:   Pathology:   Impression: S/p emeggnt c section for NRFHT  Plan:   Follow up: 4  weeks  Meds ordered this encounter  Medications  . sertraline (ZOLOFT) 50 MG tablet    Sig: Take 1 tablet (50 mg total) by mouth daily.    Dispense:  30 tablet    Refill:  5    Lazaro Arms, MD

## 2020-10-06 DIAGNOSIS — F422 Mixed obsessional thoughts and acts: Secondary | ICD-10-CM | POA: Diagnosis not present

## 2020-10-06 DIAGNOSIS — F411 Generalized anxiety disorder: Secondary | ICD-10-CM | POA: Diagnosis not present

## 2020-10-07 ENCOUNTER — Ambulatory Visit: Payer: Medicaid Other | Admitting: Advanced Practice Midwife

## 2020-10-15 ENCOUNTER — Ambulatory Visit: Payer: Medicaid Other | Admitting: Advanced Practice Midwife

## 2020-10-19 ENCOUNTER — Encounter: Payer: Self-pay | Admitting: Advanced Practice Midwife

## 2020-10-19 ENCOUNTER — Ambulatory Visit (INDEPENDENT_AMBULATORY_CARE_PROVIDER_SITE_OTHER): Payer: Medicaid Other | Admitting: Advanced Practice Midwife

## 2020-10-19 ENCOUNTER — Other Ambulatory Visit: Payer: Self-pay

## 2020-10-19 DIAGNOSIS — B977 Papillomavirus as the cause of diseases classified elsewhere: Secondary | ICD-10-CM | POA: Insufficient documentation

## 2020-10-19 DIAGNOSIS — Z30430 Encounter for insertion of intrauterine contraceptive device: Secondary | ICD-10-CM | POA: Diagnosis not present

## 2020-10-19 DIAGNOSIS — Z302 Encounter for sterilization: Secondary | ICD-10-CM | POA: Insufficient documentation

## 2020-10-19 DIAGNOSIS — R87613 High grade squamous intraepithelial lesion on cytologic smear of cervix (HGSIL): Secondary | ICD-10-CM

## 2020-10-19 DIAGNOSIS — O99893 Other specified diseases and conditions complicating puerperium: Secondary | ICD-10-CM

## 2020-10-19 DIAGNOSIS — Z3480 Encounter for supervision of other normal pregnancy, unspecified trimester: Secondary | ICD-10-CM

## 2020-10-19 MED ORDER — LEVONORGESTREL 19.5 MCG/DAY IU IUD: INTRAUTERINE_SYSTEM | Freq: Once | INTRAUTERINE | Status: AC

## 2020-10-19 MED ORDER — SERTRALINE HCL 50 MG PO TABS: 75.0000 mg | ORAL_TABLET | Freq: Every day | ORAL | 5 refills | Status: DC

## 2020-10-19 NOTE — Patient Instructions (Signed)
IUD PLACEMENT POST-PROCEDURE INSTRUCTIONS  1. You may take Ibuprofen, Aleve or Tylenol for pain if needed.  Cramping should resolve within in 24 hours.  2. You may have a small amount of spotting.  You should wear a mini pad for the next few days  3.    Nothing in the vagina for 3 days (tampons or intercourse)  4.  You need to call if you have a fever (more than 100.4), any moderate to severe pelvic pain, fever, or foul smelling vaginal discharge.  Irregular bleeding is common the first several months after having an IUD placed. You do not need to call for this reason unless you are concerned.  5. Shower or bathe as normal  6.  You should have a follow-up appointment in 4-8 weeks for a re-check to make sure you are not having any problems.  

## 2020-10-19 NOTE — Progress Notes (Signed)
Post Partum Visit Note  Tara Mcmahon is a 24 y.o. [phone] female who presents for a postpartum visit. She is 6 weeks postpartum following a primary cesarean section.  I have fully reviewed the prenatal and intrapartum course. The delivery was at 37.5 gestational weeks.  Anesthesia: epidural. Postpartum course has been uneventful. Baby is doing well. Baby is feeding by both breast and bottle - Similac Advance. Bleeding no bleeding. Bowel function is normal. Bladder function is normal. Patient is sexually active. Contraception method is condoms. Postpartum depression screening: negative.    Review of Systems Pertinent items are noted in HPI.   Objective:  Blood pressure 131/89, pulse 88, height 4\' 11"  (1.499 m), weight 193 lb (87.5 kg), currently breastfeeding.  General:  alert, cooperative, and no distress   Breasts:  negative  Lungs: Normal respiratory effort  Heart:  regular rate and rhythm  Abdomen: soft, non-tender, incision well healed   Vulva:  normal  Vagina: normal vagina  Cervix:  normal  Corpus: Well involuted  Adnexa:  not evaluated  Rectal Exam: no hemorrhoids      Wants Liletta IUD:  The risks and benefits of the method and placement have been thouroughly reviewed with the patient and all questions were answered.  Specifically the patient is aware of failure rate of 11/998, expulsion of the IUD and of possible perforation.  The patient is aware of irregular bleeding due to the method and understands the incidence of irregular bleeding diminishes with time.  Time out was performed.  A Graves speculum was placed.  The cervix was prepped using Betadine. The uterus was found to be neutral and it sounded to 8 cm.  The cervix was grasped with a tenaculum and the IUD was inserted to 8 cm.  It was pulled back 1 cm and the IUD was disengaged.  The strings were trimmed to 3 cm.  Sonogram was performed and the proper placement of the IUD was verified.  The patient was  instructed on signs and symptoms of infection and to check for the strings after each menses or each month.  The patient is to refrain from intercourse for 3 days.   Assessment:    normal postpartum exam. Pap smear not done at today's visit.   Liletta IUD inserted  Plan:   Essential components of care per ACOG recommendations:  1.  Mood and well being: Patient with negative depression screening today. Zoloft is helping, but wants to go ahead and increase to 75mg ..  - Patient does not use tobacco. If using tobacco we discussed reduction and for recently cessation risk of relapse - hx of drug use? No   If yes, discussed support systems  2. Infant care and feeding:  -Patient currently breastmilk feeding? Yes If breastmilk feeding discussed return to work and pumping. If needed, patient was provided letter for work to allow for every 2-3 hr pumping breaks, and to be granted a private location to express breastmilk and refrigerated area to store breastmilk. Reviewed importance of draining breast regularly to support lactation. -Social determinants of health (SDOH) reviewed in EPIC. No concernsThe following needs were identified  3. Sexuality, contraception and birth spacing - Patient does not want a pregnancy in the next year.  Desired family size is 4 children.  - Reviewed forms of contraception in tiered fashion. Patient desired IUD today.   - Discussed birth spacing of 18 months  4. Sleep and fatigue -Encouraged family/partner/community support of 4 hrs of uninterrupted sleep to help  with mood and fatigue  5. Physical Recovery  - Discussed patients delivery and complications - Patient had a 0 degree laceration, perineal healing reviewed. Patient expressed understanding - Patient has urinary incontinence? No Patient was referred to pelvic floor PT  - Patient is safe to resume physical and sexual activity  6.  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 Repeat this July  7. Chronic Disease - PCP follow up  Jacklyn Shell, CNM Center for [employer], [facility]

## 2020-10-30 ENCOUNTER — Telehealth: Payer: Self-pay | Admitting: Obstetrics & Gynecology

## 2020-10-30 MED ORDER — DESOGESTREL-ETHINYL ESTRADIOL 0.15-30 MG-MCG PO TABS: 1.0000 | ORAL_TABLET | Freq: Every day | ORAL | 11 refills | Status: DC

## 2020-10-30 NOTE — Telephone Encounter (Signed)
COC e prescribed for patietn

## 2020-11-05 ENCOUNTER — Ambulatory Visit: Payer: Medicaid Other | Admitting: Women's Health

## 2020-11-09 ENCOUNTER — Ambulatory Visit: Payer: Medicaid Other | Admitting: Women's Health

## 2020-11-10 ENCOUNTER — Ambulatory Visit: Payer: Medicaid Other | Admitting: Women's Health

## 2020-11-11 ENCOUNTER — Telehealth: Payer: Self-pay

## 2020-11-11 NOTE — Telephone Encounter (Signed)
Copied from CRM [id number]. Topic: Appointment Scheduling - Scheduling Inquiry for Clinic >> Nov 11, 2020  2:11 PM Dalphine Handing A wrote: Before patient schedules new patient appt, she wants to know if its okay for her to rbing her 4 kids in with her at the time of the appt due to not having a sitter. Patient was left on hold for 9 min and hung up. Please advise

## 2020-11-11 NOTE — Telephone Encounter (Signed)
Advised patient that we are only allowing just the patient back into the exam room at this time due to covid unless the patient needs assistance with their care.

## 2020-11-12 ENCOUNTER — Ambulatory Visit: Payer: Medicaid Other | Admitting: Advanced Practice Midwife

## 2020-12-18 ENCOUNTER — Ambulatory Visit: Payer: Self-pay | Admitting: Family Medicine

## 2021-02-19 ENCOUNTER — Ambulatory Visit: Payer: Self-pay | Admitting: Physician Assistant

## 2021-06-10 ENCOUNTER — Other Ambulatory Visit (HOSPITAL_COMMUNITY)
Admission: RE | Admit: 2021-06-10 | Discharge: 2021-06-10 | Disposition: A | Discharge status: Home / Self Care | Payer: Medicaid Other | Source: Ambulatory Visit | Attending: Advanced Practice Midwife | Admitting: Advanced Practice Midwife

## 2021-06-10 ENCOUNTER — Other Ambulatory Visit: Payer: Self-pay

## 2021-06-10 ENCOUNTER — Encounter: Payer: Self-pay | Admitting: Advanced Practice Midwife

## 2021-06-10 ENCOUNTER — Ambulatory Visit (INDEPENDENT_AMBULATORY_CARE_PROVIDER_SITE_OTHER): Payer: Medicaid Other | Admitting: Advanced Practice Midwife

## 2021-06-10 VITALS — BP 138/93 | HR 79 | Ht 59.0 in | Wt 232.2 lb

## 2021-06-10 DIAGNOSIS — Z8639 Personal history of other endocrine, nutritional and metabolic disease: Secondary | ICD-10-CM | POA: Diagnosis not present

## 2021-06-10 DIAGNOSIS — Z131 Encounter for screening for diabetes mellitus: Secondary | ICD-10-CM | POA: Diagnosis not present

## 2021-06-10 DIAGNOSIS — Z01419 Encounter for gynecological examination (general) (routine) without abnormal findings: Secondary | ICD-10-CM | POA: Diagnosis not present

## 2021-06-10 DIAGNOSIS — Z30432 Encounter for removal of intrauterine contraceptive device: Secondary | ICD-10-CM

## 2021-06-10 DIAGNOSIS — B977 Papillomavirus as the cause of diseases classified elsewhere: Secondary | ICD-10-CM

## 2021-06-10 DIAGNOSIS — Z1321 Encounter for screening for nutritional disorder: Secondary | ICD-10-CM | POA: Diagnosis not present

## 2021-06-10 DIAGNOSIS — E038 Other specified hypothyroidism: Secondary | ICD-10-CM | POA: Diagnosis not present

## 2021-06-10 MED ORDER — MISOPROSTOL 200 MCG PO TABS: 400.0000 ug | ORAL_TABLET | Freq: Once | ORAL | 0 refills | Status: DC

## 2021-06-10 NOTE — Patient Instructions (Addendum)
Semaglutide Paragard [facility]: [address], [phone]                [facility] Medicine [facility]): [address], [phone] (call to ask if accepting patients) [facility] Department: [address], [place], [phone]   [facility]  [address] [place]  [phone] [facility]  [address]  [place]  [phone]  [facility]: [address] [place], [phone] [facility]: [address], [phone]  [facility]  [facility]): [phone] [facility]: [address] [phone]   [facility]: [address], [phone]  [facility] Doctors  [facility]: [phone], [phone]

## 2021-06-10 NOTE — Progress Notes (Signed)
Aaleeyah A Few 25 y.o.  Vitals:   06/10/21 1600  BP: (!) 138/93  Pulse: 79     Filed Weights   06/10/21 1600  Weight: 232 lb 3.2 oz (105.3 kg)    Past Medical History: Past Medical History:  Diagnosis Date   Anemia    Anxiety    Cervical incompetence during pregnancy    Depression    Hypothyroidism    PCOS (polycystic ovarian syndrome)    Pregnancy induced hypertension    With second pregnancy    Past Surgical History: Past Surgical History:  Procedure Laterality Date   CERVICAL CERCLAGE     CERVICAL CERCLAGE N/A 03/09/2016   Procedure: [facility] CERCLAGE PLACEMENT;  Surgeon: Lazaro Arms, MD;  Location: [facility];  Service: Gynecology;  Laterality: N/A;   CERVICAL CERCLAGE N/A 01/31/2018   Procedure: CERCLAGE CERVICAL;  Surgeon: Lazaro Arms, MD;  Location: [facility];  Service: Gynecology;  Laterality: N/A;   CERVICAL CERCLAGE     Placed in 2013, 2017, and 2019   CERVICAL CERCLAGE N/A 03/13/2020   Procedure: CERCLAGE CERVICAL ([facility]);  Surgeon: Lazaro Arms, MD;  Location: [facility];  Service: Gynecology;  Laterality: N/A;   CESAREAN SECTION N/A 08/21/2020   Procedure: CESAREAN SECTION;  Surgeon: Adam Phenix, MD;  Location: [facility];  Service: Obstetrics;  Laterality: N/A;   labial reduction      Family History: Family History  Problem Relation Age of Onset   Arthritis Mother    Lupus Mother    Autoimmune disease Mother    Crohn's disease Mother    Cancer Maternal Grandmother        breast   Heart disease Maternal Grandfather    Heart disease Paternal Grandfather    Hypertension Paternal Grandfather    Lupus Maternal Aunt     Social History: Social History   Tobacco Use   Smoking status: Never   Smokeless tobacco: Never  Vaping Use   Vaping Use: Never used  Substance Use Topics   Alcohol use: Not Currently    Comment: occ; not now   Drug use: No    Allergies:  Allergies  Allergen Reactions   Shellfish Allergy Anaphylaxis and Swelling     Only Crawfish not allergic to any other shellfish       Current Outpatient Medications:    misoprostol (CYTOTEC) 200 MCG tablet, Take 2 tablets (400 mcg total) by mouth once for 1 dose. 4 hours prior to IUD appointment, Disp: 2 tablet, Rfl: 0   Blood Pressure Monitor MISC, For regular home bp monitoring during pregnancy (Patient not taking: Reported on 06/10/2021), Disp: 1 each, Rfl: 0   desogestrel-ethinyl estradiol (APRI) 0.15-30 MG-MCG tablet, Take 1 tablet by mouth daily. (Patient not taking: Reported on 06/10/2021), Disp: 28 tablet, Rfl: 11   sertraline (ZOLOFT) 50 MG tablet, Take 1.5 tablets (75 mg total) by mouth daily. (Patient not taking: Reported on 06/10/2021), Disp: 45 tablet, Rfl: 5  History of Present Illness: Here for pap/physical. Last pap a year ago, neg w/Hr HPV, not 16/18/45.  Had Liletta IUD inserted 11/21, may want it out has gained weight, feels like it is the IUD  Did well on COCs, but loves reliability of IUD  Discussed paragard, nuva ring, phexxi.  Also talked about semaglutide for wt loss. Doesn't have a pcp yet, just got medicaid.  List given. Hx of off and on thyroid issues, will check today. Will skip lipids d/t not fasting and needing to  do the bloodwork today  Review of Systems   Patient denies any headaches, blurred vision, shortness of breath, chest pain, abdominal pain, problems with bowel movements, urination, or intercourse.   Physical Exam: General:  Well developed, well nourished, no acute distress Skin:  Warm and dry Neck:  Midline trachea, normal thyroid Lungs; Clear to auscultation bilaterally Breast:  No dominant palpable mass, retraction, or nipple discharge Cardiovascular: Regular rate and rhythm Abdomen:  Soft, non tender, no hepatosplenomegaly Pelvic:  External genitalia is normal in appearance.  The vagina is normal in appearance.  The cervix is small, no strings visible, pt husband checks every few weeks and says he can feel therm.  Uterus is felt  to be normal size, shape, and contour.  No adnexal masses or tenderness noted. Exam limited by habitus Extremities:  No swelling or varicosities noted Psych:  No mood changes.     Impression: normal gyn exam     Plan:  Orders Placed This Encounter  Procedures   Comprehensive metabolic panel   CBC   Hemoglobin A1c   VITAMIN D 25 Hydroxy (Vit-D Deficiency, Fractures)   Thyroid Panel With TSH   Research the other BCs, rx for cytotec if chooses IUD option.  If chooses another, wil start prior to dcing IUD (pt does NOT want an interruption in [facility])

## 2021-06-11 LAB — COMPREHENSIVE METABOLIC PANEL
ALT: 21 IU/L (ref 0–32)
AST: 17 IU/L (ref 0–40)
Albumin/Globulin Ratio: 1.7 (ref 1.2–2.2)
Albumin: 4.7 g/dL (ref 3.9–5.0)
Alkaline Phosphatase: 95 IU/L (ref 44–121)
BUN/Creatinine Ratio: 14 (ref 9–23)
BUN: 11 mg/dL (ref 6–20)
Bilirubin Total: 0.3 mg/dL (ref 0.0–1.2)
CO2: 24 mmol/L (ref 20–29)
Calcium: 9.7 mg/dL (ref 8.7–10.2)
Chloride: 100 mmol/L (ref 96–106)
Creatinine, Ser: 0.81 mg/dL (ref 0.57–1.00)
Globulin, Total: 2.7 g/dL (ref 1.5–4.5)
Glucose: 89 mg/dL (ref 65–99)
Potassium: 4.8 mmol/L (ref 3.5–5.2)
Sodium: 136 mmol/L (ref 134–144)
Total Protein: 7.4 g/dL (ref 6.0–8.5)
eGFR: 103 mL/min/{1.73_m2} (ref >59)

## 2021-06-11 LAB — THYROID PANEL WITH TSH
Free Thyroxine Index: 1.6 (ref 1.2–4.9)
T3 Uptake Ratio: 24 % (ref 24–39)
T4, Total: 6.7 ug/dL (ref 4.5–12.0)
TSH: 3.48 u[IU]/mL (ref 0.450–4.500)

## 2021-06-11 LAB — CBC
Hematocrit: 45 % (ref 34.0–46.6)
Hemoglobin: 14.9 g/dL (ref 11.1–15.9)
MCH: 28.4 pg (ref 26.6–33.0)
MCHC: 33.1 g/dL (ref 31.5–35.7)
MCV: 86 fL (ref 79–97)
Platelets: 418 10*3/uL (ref 150–450)
RBC: 5.24 x10E6/uL (ref 3.77–5.28)
RDW: 12.4 % (ref 11.7–15.4)
WBC: 8.7 10*3/uL (ref 3.4–10.8)

## 2021-06-11 LAB — VITAMIN D 25 HYDROXY (VIT D DEFICIENCY, FRACTURES): Vit D, 25-Hydroxy: 10.6 ng/mL — ABNORMAL LOW (ref 30.0–100.0)

## 2021-06-11 LAB — HEMOGLOBIN A1C
Est. average glucose Bld gHb Est-mCnc: 105 mg/dL
Hgb A1c MFr Bld: 5.3 % (ref 4.8–5.6)

## 2021-06-12 ENCOUNTER — Encounter: Payer: Self-pay | Admitting: Advanced Practice Midwife

## 2021-06-12 ENCOUNTER — Other Ambulatory Visit: Payer: Self-pay | Admitting: Advanced Practice Midwife

## 2021-06-12 DIAGNOSIS — E559 Vitamin D deficiency, unspecified: Secondary | ICD-10-CM | POA: Insufficient documentation

## 2021-06-12 MED ORDER — VITAMIN D (ERGOCALCIFEROL) 50000 UNITS PO CAPS: 50000.0000 [IU] | ORAL_CAPSULE | ORAL | 0 refills | Status: DC

## 2021-06-16 LAB — CYTOLOGY - PAP
Comment: NEGATIVE
Diagnosis: NEGATIVE
High risk HPV: NEGATIVE

## 2021-06-25 ENCOUNTER — Other Ambulatory Visit: Payer: Self-pay | Admitting: Advanced Practice Midwife

## 2021-06-25 DIAGNOSIS — Z8742 Personal history of other diseases of the female genital tract: Secondary | ICD-10-CM

## 2021-06-25 NOTE — Progress Notes (Signed)
Pt wants to start spironolacotone to help w/weight/acne/hirsutism d/t her pCOS. Has LGN IUD, requested testosterone levels checked.

## 2021-09-07 ENCOUNTER — Ambulatory Visit: Payer: Medicaid Other | Admitting: Family Medicine

## 2021-09-15 ENCOUNTER — Ambulatory Visit: Payer: Self-pay | Admitting: Nurse Practitioner

## 2021-09-17 ENCOUNTER — Encounter: Payer: Self-pay | Admitting: Obstetrics & Gynecology

## 2021-09-17 ENCOUNTER — Ambulatory Visit: Payer: Medicaid Other | Admitting: Obstetrics & Gynecology

## 2021-09-17 ENCOUNTER — Other Ambulatory Visit: Payer: Self-pay

## 2021-09-17 VITALS — BP 133/85 | HR 87 | Ht 59.0 in | Wt 246.0 lb

## 2021-09-17 DIAGNOSIS — E282 Polycystic ovarian syndrome: Secondary | ICD-10-CM

## 2021-09-17 DIAGNOSIS — L906 Striae atrophicae: Secondary | ICD-10-CM

## 2021-09-17 NOTE — Progress Notes (Signed)
   GYN VISIT Patient name: Tara Mcmahon MRN [id number]  Date of birth: 1996/10/19 Chief Complaint:   Breast Problem  History of Present Illness:   Tara Mcmahon is a 25 y.o. [phone]  female being seen today for the following concern:  Breast concerns: A few wks ago noted itching on the side of her right breast and then noted some redness.  Noted a small cyst then ruptured on her own.  Again the itching recurred- seems to come and go.  Denies lump/mass.  Denies nipple discharge.  Reports h/o breast cancer- maternal grandmother (not sure age), no other relatives  Weight gain: Since Liletta placement she has noted significant weight gain- more than 40lbs.  States her diet has been unchanged and that she remains a very active person.    No LMP recorded. (Menstrual status: IUD).  Depression screen [facility] 2/9 06/10/2021 06/05/2020 02/25/2020 12/13/2017 06/23/2017  Decreased Interest 0 1 0 1 0  Down, Depressed, Hopeless 1 0 0 1 0  PHQ - 2 Score 1 1 0 2 0  Altered sleeping 1 1 - 0 -  Tired, decreased energy 1 1 1 1  -  Change in appetite 0 0 0 0 -  Feeling bad or failure about yourself  0 0 0 0 -  Trouble concentrating 0 0 0 0 -  Moving slowly or fidgety/restless 0 0 0 1 -  Suicidal thoughts 0 0 0 0 -  PHQ-9 Score 3 3 - 4 -  Difficult doing work/chores - Not difficult at all Not difficult at all Not difficult at all -  Some recent data might be hidden     Review of Systems:   Pertinent items are noted in HPI Denies fever/chills, dizziness, headaches, visual disturbances, fatigue, shortness of breath, chest pain, abdominal pain, vomiting,  problems with periods, bowel movements, urination, or intercourse unless otherwise stated above.  Pertinent History Reviewed:  Reviewed past medical,surgical, social, obstetrical and family history.  Reviewed problem list, medications and allergies. Physical Assessment:   Vitals:   09/17/21 0834  BP: 133/85  Pulse: 87  Weight: 246 lb (111.6 kg)   Height: 4\' 11"  (1.499 m)  Body mass index is 49.69 kg/m.       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   , DO Attending Obstetrician & Gynecologist, Faculty Practice Center for , [facility]

## 2021-09-27 ENCOUNTER — Other Ambulatory Visit: Payer: Self-pay | Admitting: Adult Health

## 2021-09-27 MED ORDER — LO LOESTRIN FE 1 MG-10 MCG / 10 MCG PO TABS: 1.0000 | ORAL_TABLET | Freq: Every day | ORAL | 11 refills | Status: DC

## 2021-10-04 ENCOUNTER — Ambulatory Visit: Payer: Medicaid Other | Admitting: Adult Health

## 2021-10-14 ENCOUNTER — Ambulatory Visit: Payer: Medicaid Other | Admitting: Obstetrics & Gynecology

## 2021-10-28 ENCOUNTER — Ambulatory Visit: Payer: Medicaid Other | Admitting: Obstetrics & Gynecology

## 2021-11-23 DIAGNOSIS — Z6841 Body Mass Index (BMI) 40.0 and over, adult: Secondary | ICD-10-CM | POA: Diagnosis not present

## 2021-11-23 DIAGNOSIS — Z832 Family history of diseases of the blood and blood-forming organs and certain disorders involving the immune mechanism: Secondary | ICD-10-CM | POA: Diagnosis not present

## 2021-11-23 DIAGNOSIS — Z975 Presence of (intrauterine) contraceptive device: Secondary | ICD-10-CM | POA: Diagnosis not present

## 2021-11-23 DIAGNOSIS — E282 Polycystic ovarian syndrome: Secondary | ICD-10-CM | POA: Diagnosis not present

## 2021-12-27 ENCOUNTER — Ambulatory Visit: Payer: Medicaid Other | Admitting: Obstetrics & Gynecology

## 2021-12-29 ENCOUNTER — Encounter: Payer: Self-pay | Admitting: Advanced Practice Midwife

## 2022-01-20 ENCOUNTER — Encounter: Payer: Self-pay | Admitting: Obstetrics & Gynecology

## 2022-01-20 ENCOUNTER — Other Ambulatory Visit: Payer: Self-pay | Admitting: *Deleted

## 2022-01-20 ENCOUNTER — Other Ambulatory Visit: Payer: Medicaid Other

## 2022-01-20 DIAGNOSIS — N926 Irregular menstruation, unspecified: Secondary | ICD-10-CM | POA: Diagnosis not present

## 2022-01-21 ENCOUNTER — Other Ambulatory Visit: Payer: Medicaid Other

## 2022-01-21 LAB — BETA HCG QUANT (REF LAB): hCG Quant: <1 m[IU]/mL

## 2022-06-09 DIAGNOSIS — F422 Mixed obsessional thoughts and acts: Secondary | ICD-10-CM | POA: Diagnosis not present

## 2022-06-09 DIAGNOSIS — F411 Generalized anxiety disorder: Secondary | ICD-10-CM | POA: Diagnosis not present

## 2022-06-22 DIAGNOSIS — F422 Mixed obsessional thoughts and acts: Secondary | ICD-10-CM | POA: Diagnosis not present

## 2022-06-22 DIAGNOSIS — F411 Generalized anxiety disorder: Secondary | ICD-10-CM | POA: Diagnosis not present

## 2022-07-21 DIAGNOSIS — F422 Mixed obsessional thoughts and acts: Secondary | ICD-10-CM | POA: Diagnosis not present

## 2022-07-21 DIAGNOSIS — F411 Generalized anxiety disorder: Secondary | ICD-10-CM | POA: Diagnosis not present

## 2022-08-04 ENCOUNTER — Encounter: Payer: Self-pay | Admitting: Obstetrics & Gynecology

## 2022-08-23 ENCOUNTER — Encounter: Payer: Self-pay | Admitting: Obstetrics & Gynecology

## 2022-08-23 ENCOUNTER — Ambulatory Visit: Payer: Medicaid Other | Admitting: Obstetrics & Gynecology

## 2022-08-23 VITALS — BP 132/89 | HR 98 | Ht 59.0 in | Wt 249.0 lb

## 2022-08-23 DIAGNOSIS — Z3009 Encounter for other general counseling and advice on contraception: Secondary | ICD-10-CM | POA: Diagnosis not present

## 2022-08-23 NOTE — Progress Notes (Signed)
Chief Complaint  Patient presents with   Contraception    Discuss options, doesn't like iud. Interested in tubal but willing to discuss other options.       26 y.o. [id number] No LMP recorded. (Menstrual status: IUD). The current method of family planning is IUD.  Outpatient Encounter Medications as of 08/23/2022  Medication Sig   [DISCONTINUED] Blood Pressure Monitor MISC For regular home bp monitoring during pregnancy (Patient not taking: No sig reported)   [DISCONTINUED] misoprostol (CYTOTEC) 200 MCG tablet Take 2 tablets (400 mcg total) by mouth once for 1 dose. 4 hours prior to IUD appointment   [DISCONTINUED] Norethindrone-Ethinyl Estradiol-Fe Biphas (LO LOESTRIN FE) 1 MG-10 MCG / 10 MCG tablet Take 1 tablet by mouth daily. Take 1 daily by mouth   [DISCONTINUED] sertraline (ZOLOFT) 50 MG tablet Take 1.5 tablets (75 mg total) by mouth daily. (Patient not taking: No sig reported)   [DISCONTINUED] Vitamin D, Ergocalciferol, 50000 units CAPS Take 50,000 Units by mouth once a week. (Patient not taking: Reported on 09/17/2021)   No facility-administered encounter medications on file as of 08/23/2022.    Subjective Pt has gained >50 pounds on Mirena and is discussing permanent options Including tubal ligation We discussed her pregnancy history and it is extensive As well as the pros and cons of permanent sterilization All questions were answered  Past Medical History:  Diagnosis Date   Anemia    Anxiety    Cervical incompetence during pregnancy    Depression    Hypothyroidism    PCOS (polycystic ovarian syndrome)    Pregnancy induced hypertension    With second pregnancy    Past Surgical History:  Procedure Laterality Date   CERVICAL CERCLAGE     CERVICAL CERCLAGE N/A 03/09/2016   Procedure: [facility] CERCLAGE PLACEMENT;  Surgeon: Florian Buff, MD;  Location: [facility];  Service: Gynecology;  Laterality: N/A;   CERVICAL CERCLAGE N/A 01/31/2018   Procedure: CERCLAGE  CERVICAL;  Surgeon: Florian Buff, MD;  Location: [facility];  Service: Gynecology;  Laterality: N/A;   CERVICAL CERCLAGE     Placed in 2013, 2017, and 2019   CERVICAL CERCLAGE N/A 03/13/2020   Procedure: CERCLAGE CERVICAL ([facility]);  Surgeon: Florian Buff, MD;  Location: [facility];  Service: Gynecology;  Laterality: N/A;   CESAREAN SECTION N/A 08/21/2020   Procedure: CESAREAN SECTION;  Surgeon: Woodroe Mode, MD;  Location: [facility];  Service: Obstetrics;  Laterality: N/A;   labial reduction      OB History     Gravida  5   Para  4   Term  3   Preterm  1   AB  1   Living  4      SAB  1   IAB      Ectopic      Multiple  0   Live Births  4           Allergies  Allergen Reactions   Shellfish Allergy Anaphylaxis and Swelling    Only Crawfish not allergic to any other shellfish     Social History   Socioeconomic History   Marital status: Married    Spouse name: Not on file   Number of children: Not on file   Years of education: Not on file   Highest education level: Not on file  Occupational History   Not on file  Tobacco Use   Smoking status: Never   Smokeless tobacco: Never  Vaping Use   Vaping Use: Never used  Substance and Sexual Activity   Alcohol use: Not Currently    Comment: occ; not now   Drug use: No   Sexual activity: Yes    Birth control/protection: None, Condom  Other Topics Concern   Not on file  Social History Narrative   Not on file   Social Determinants of Health   Financial Resource Strain: Low Risk  (06/10/2021)   Overall Financial Resource Strain (CARDIA)    Difficulty of Paying Living Expenses: Not hard at all  Food Insecurity: No Food Insecurity (06/10/2021)   Hunger Vital Sign    Worried About Running Out of Food in the Last Year: Never true    Ran Out of Food in the Last Year: Never true  Transportation Needs: No Transportation Needs (06/10/2021)   PRAPARE - Hydrologist (Medical): No     Lack of Transportation (Non-Medical): No  Physical Activity: Insufficiently Active (06/10/2021)   Exercise Vital Sign    Days of Exercise per Week: 3 days    Minutes of Exercise per Session: 40 min  Stress: No Stress Concern Present (06/10/2021)   [place]    Feeling of Stress : Only a little  Social Connections: Moderately Isolated (06/10/2021)   Social Connection and Isolation Panel [NHANES]    Frequency of Communication with Friends and Family: Three times a week    Frequency of Social Gatherings with Friends and Family: Never    Attends Religious Services: Never    Marine scientist or Organizations: No    Attends Music therapist: Never    Marital Status: Married    Family History  Problem Relation Age of Onset   Arthritis Mother    Lupus Mother    Autoimmune disease Mother    Crohn's disease Mother    Cancer Maternal Grandmother        breast   Heart disease Maternal Grandfather    Heart disease Paternal Grandfather    Hypertension Paternal Grandfather    Lupus Maternal Aunt     Medications:      No current outpatient medications on file.  Objective Blood pressure 132/89, pulse 98, height 4\' 11"  (1.499 m), weight 249 lb (112.9 kg).  Gen WDWN NAD  Pertinent ROS No burning with urination, frequency or urgency No nausea, vomiting or diarrhea Nor fever chills or other constitutional symptoms   Labs or studies N/a    Impression + Management Plan: Diagnoses this Encounter::   ICD-10-CM   1. Encounter for consultation for female sterilization  Z30.09    weighing options, decision ball is in her court        Medications prescribed during  this encounter: No orders of the defined types were placed in this encounter.   Labs or Scans Ordered during this encounter: No orders of the defined types were placed in this encounter.     Follow up No follow-ups on  file.

## 2022-09-06 ENCOUNTER — Encounter: Payer: Self-pay | Admitting: Obstetrics & Gynecology

## 2022-09-13 DIAGNOSIS — F422 Mixed obsessional thoughts and acts: Secondary | ICD-10-CM | POA: Diagnosis not present

## 2022-09-13 DIAGNOSIS — F411 Generalized anxiety disorder: Secondary | ICD-10-CM | POA: Diagnosis not present

## 2022-09-15 ENCOUNTER — Encounter: Payer: Self-pay | Admitting: Obstetrics & Gynecology

## 2022-09-15 ENCOUNTER — Ambulatory Visit (INDEPENDENT_AMBULATORY_CARE_PROVIDER_SITE_OTHER): Payer: Medicaid Other | Admitting: Obstetrics & Gynecology

## 2022-09-15 ENCOUNTER — Ambulatory Visit: Payer: Medicaid Other | Admitting: Obstetrics & Gynecology

## 2022-09-15 VITALS — BP 121/81 | HR 102 | Ht 59.0 in | Wt 251.0 lb

## 2022-09-15 DIAGNOSIS — Z3009 Encounter for other general counseling and advice on contraception: Secondary | ICD-10-CM

## 2022-09-16 ENCOUNTER — Encounter: Payer: Self-pay | Admitting: Obstetrics & Gynecology

## 2022-09-16 NOTE — Progress Notes (Signed)
Follow up appointment for follow up to her sterilization counselling:   Chief Complaint  Patient presents with   consultation for female sterilization    Blood pressure 121/81, pulse (!) 102, height 4\' 11"  (1.499 m), weight 251 lb (113.9 kg).    Tara Mcmahon and I have discussed options for birth control via office visit and via MYChart messages She is struggling with the decision which I understand but I am encouraging her to make the best decision for her family She and her husband have talked at length She wants to proceed, 30 day papers signed and appt will be made for surgery All questions were answered  MEDS ordered this encounter: No orders of the defined types were placed in this encounter.   Orders for this encounter: No orders of the defined types were placed in this encounter.   Impression + Management Plan   ICD-10-CM   1. Encounter for consultation for female sterilization  Z30.09    laparoscopic bilateral salpingectomy 10/26/22      Follow Up: Return for Post Op.     All questions were answered.  Past Medical History:  Diagnosis Date   Anemia    Anxiety    Cervical incompetence during pregnancy    Depression    Hypothyroidism    PCOS (polycystic ovarian syndrome)    Pregnancy induced hypertension    With second pregnancy    Past Surgical History:  Procedure Laterality Date   CERVICAL CERCLAGE     CERVICAL CERCLAGE N/A 03/09/2016   Procedure: [facility] CERCLAGE PLACEMENT;  Surgeon: [facility], MD;  Location: [facility];  Service: Gynecology;  Laterality: N/A;   CERVICAL CERCLAGE N/A 01/31/2018   Procedure: CERCLAGE CERVICAL;  Surgeon: 04/02/2018, MD;  Location: [facility];  Service: Gynecology;  Laterality: N/A;   CERVICAL CERCLAGE     Placed in 2013, 2017, and 2019   CERVICAL CERCLAGE N/A 03/13/2020   Procedure: CERCLAGE CERVICAL ([place]);  Surgeon: 03/15/2020, MD;  Location: [facility];  Service: Gynecology;  Laterality: N/A;   CESAREAN  SECTION N/A 08/21/2020   Procedure: CESAREAN SECTION;  Surgeon: 08/23/2020, MD;  Location: [facility]S;  Service: Obstetrics;  Laterality: N/A;   labial reduction      OB History     Gravida  5   Para  4   Term  3   Preterm  1   AB  1   Living  4      SAB  1   IAB      Ectopic      Multiple  0   Live Births  4           Allergies  Allergen Reactions   Shellfish Allergy Anaphylaxis and Swelling    Only Crawfish not allergic to any other shellfish     Social History   Socioeconomic History   Marital status: Married    Spouse name: Not on file   Number of children: Not on file   Years of education: Not on file   Highest education level: Not on file  Occupational History   Not on file  Tobacco Use   Smoking status: Never   Smokeless tobacco: Never  Vaping Use   Vaping Use: Never used  Substance and Sexual Activity   Alcohol use: Not Currently    Comment: occ; not now   Drug use: No   Sexual activity: Yes    Birth control/protection: I.U.D.  Other Topics Concern   Not on file  Social History Narrative   Not on file   Social Determinants of Health   Financial Resource Strain: Low Risk  (06/10/2021)   Overall Financial Resource Strain (CARDIA)    Difficulty of Paying Living Expenses: Not hard at all  Food Insecurity: No Food Insecurity (06/10/2021)   Hunger Vital Sign    Worried About Running Out of Food in the Last Year: Never true    Ran Out of Food in the Last Year: Never true  Transportation Needs: No Transportation Needs (06/10/2021)   PRAPARE - Hydrologist (Medical): No    Lack of Transportation (Non-Medical): No  Physical Activity: Insufficiently Active (06/10/2021)   Exercise Vital Sign    Days of Exercise per Week: 3 days    Minutes of Exercise per Session: 40 min  Stress: No Stress Concern Present (06/10/2021)   [place]    Feeling of  Stress : Only a little  Social Connections: Moderately Isolated (06/10/2021)   Social Connection and Isolation Panel [NHANES]    Frequency of Communication with Friends and Family: Three times a week    Frequency of Social Gatherings with Friends and Family: Never    Attends Religious Services: Never    Marine scientist or Organizations: No    Attends Music therapist: Never    Marital Status: Married    Family History  Problem Relation Age of Onset   Arthritis Mother    Lupus Mother    Autoimmune disease Mother    Crohn's disease Mother    Cancer Maternal Grandmother        breast   Heart disease Maternal Grandfather    Heart disease Paternal Grandfather    Hypertension Paternal Grandfather    Lupus Maternal Aunt

## 2022-09-19 ENCOUNTER — Encounter: Payer: Self-pay | Admitting: Obstetrics & Gynecology

## 2022-09-22 DIAGNOSIS — F411 Generalized anxiety disorder: Secondary | ICD-10-CM | POA: Diagnosis not present

## 2022-09-22 DIAGNOSIS — F422 Mixed obsessional thoughts and acts: Secondary | ICD-10-CM | POA: Diagnosis not present

## 2022-10-24 ENCOUNTER — Encounter (HOSPITAL_COMMUNITY): Payer: Medicaid Other

## 2022-10-26 ENCOUNTER — Ambulatory Visit: Admit: 2022-10-26 | Payer: Medicaid Other | Admitting: Obstetrics & Gynecology

## 2022-10-26 SURGERY — SALPINGECTOMY, BILATERAL, LAPAROSCOPIC
Anesthesia: General | Site: Vagina

## 2022-11-03 ENCOUNTER — Encounter: Payer: Medicaid Other | Admitting: Obstetrics & Gynecology

## 2022-11-10 DIAGNOSIS — F411 Generalized anxiety disorder: Secondary | ICD-10-CM | POA: Diagnosis not present

## 2022-11-10 DIAGNOSIS — F422 Mixed obsessional thoughts and acts: Secondary | ICD-10-CM | POA: Diagnosis not present

## 2022-11-10 DIAGNOSIS — Z559 Problems related to education and literacy, unspecified: Secondary | ICD-10-CM | POA: Diagnosis not present

## 2022-11-11 ENCOUNTER — Telehealth: Payer: Self-pay | Admitting: Obstetrics & Gynecology

## 2022-11-11 ENCOUNTER — Telehealth: Payer: Medicaid Other | Admitting: Obstetrics & Gynecology

## 2022-11-11 NOTE — Telephone Encounter (Signed)
Good morning, Tara Mcmahon called yesterday afternoon and wants to know if we can schedule her surgery for 11/23/22 , as her husband is off from 11/18/22 to 11/29/22 and would be able to watch the kids. Please send me new order so that I may get it scheduled. Thank you

## 2022-11-16 ENCOUNTER — Other Ambulatory Visit: Payer: Self-pay | Admitting: Obstetrics & Gynecology

## 2022-11-16 DIAGNOSIS — Z01818 Encounter for other preprocedural examination: Secondary | ICD-10-CM

## 2022-11-16 NOTE — Patient Instructions (Signed)
Tara Mcmahon  11/16/2022     @   Your procedure is scheduled on  11/23/2022.   Report to [facility] at  0915  A.M.   Call this number if you have problems the morning of surgery:  [phone]  If you experience any cold or flu symptoms such as cough, fever, chills, shortness of breath, etc. between now and your scheduled surgery, please notify us at the above number.   Remember:  Do not eat after midnight.  You may drink clear liquids until  0715 am on 11/23/2022.          Clear liquids allowed are:                    Water, Juice (No red color; non-citric and without pulp; diabetics please choose diet or no sugar options), Carbonated beverages (diabetics please choose diet or no sugar options), Clear Tea (No creamer, milk, or cream, including half & half and powdered creamer), Black Coffee Only (No creamer, milk or cream, including half & half and powdered creamer), Plain Jell-O Only (No red color; diabetics please choose no sugar options), Clear Sports drink (No red color; diabetics please choose diet or no sugar options), and Plain Popsicles Only (No red color; diabetics please choose no sugar options)           At 0715 on 11/23/2022 drink your carb drink. You can have nothing else to drink after this.   Take these medicines the morning of surgery with A SIP OF WATER                                                   None.     Do not wear jewelry, make-up or nail polish.  Do not wear lotions, powders, or perfumes, or deodorant.  Do not shave 48 hours prior to surgery.  Men may shave face and neck.  Do not bring valuables to the hospital.  [facility] is not responsible for any belongings or valuables.  Contacts, dentures or bridgework may not be worn into surgery.  Leave your suitcase in the car.  After surgery it may be brought to your room.  For patients admitted to the hospital, discharge time will be determined by your treatment  team.  Patients discharged the day of surgery will not be allowed to drive home and must have someone with them for 24 hours.    Special instructions:   DO NOT smoke tobacco or vape for 24 hrs before your procedure.  Please read over the following fact sheets that you were given. Pain Booklet, Surgical Site Infection Prevention, Anesthesia Post-op Instructions, and Care and Recovery After Surgery      Salpingectomy, Care After The following information offers guidance on how to care for yourself after your procedure. Your health care provider may also give you more specific instructions. If you have problems or questions, contact your health care provider. What can I expect after the procedure? After the procedure, it is common to have: Pain in your abdomen. Light vaginal bleeding (spotting) for a few days. Tiredness. Your recovery time will depend on which method was used for your surgery. Follow these instructions at home: Medicines Take over-the-counter and prescription medicines only as told by your health care provider. Ask  your health care provider if the medicine prescribed to you: Requires you to avoid driving or using machinery. Can cause constipation. You may need to take actions to prevent or treat constipation, such as: Drink enough fluid to keep your urine pale yellow. Take over-the-counter or prescription medicines. Eat foods that are high in fiber, such as beans, whole grains, and fresh fruits and vegetables. Limit foods that are high in fat and processed sugars, such as fried or sweet foods. Incision care  Follow instructions from your health care provider about how to take care of your incision or incisions. Make sure you: Wash your hands with soap and water for at least 20 seconds before and after you change your bandage (dressing). If soap and water are not available, use hand sanitizer. Change or remove your dressing as told by your health care provider. Leave  stitches (sutures), skin glue, staples, or adhesive strips in place. These skin closures may need to stay in place for 2 weeks or longer. If adhesive strip edges start to loosen and curl up, you may trim the loose edges. Do not remove adhesive strips completely unless your health care provider tells you to do that. Keep your dressing clean and dry. Check your incision area every day for signs of infection. Check for: Redness, swelling, or pain that gets worse. Fluid or blood. Warmth. Pus or a bad smell. Activity Rest as told by your health care provider. Avoid sitting for a long time without moving. Get up to take short walks every 1-2 hours. This is important to improve blood flow and breathing. Ask for help if you feel weak or unsteady. Return to your normal activities as told by your health care provider. Ask your health care provider what activities are safe for you. Do not drive until your health care provider says that it is safe. Do not lift anything that is heavier than 10 lb (4.5 kg), or the limit that you are told, until your health care provider says that it is safe. This may last for 2-6 weeks depending on your surgery. Do not douche, use tampons, or have sex until your health care provider approves. General instructions Do not use any products that contain nicotine or tobacco. These products include cigarettes, chewing tobacco, and vaping devices, such as e-cigarettes. These can delay healing after surgery. If you need help quitting, ask your health care provider. Wear compression stockings as told by your health care provider. These stockings help to prevent blood clots and reduce swelling in your legs. Do not take baths, swim, or use a hot tub until your health care provider approves. You may take showers. Keep all follow-up visits. This is important. Contact a health care provider if: You have pain when you urinate. You have redness, swelling, or more pain around an incision or  an incision feels warm to the touch. You have pus, fluid, blood, or a bad smell coming from an incision or an incision starts to open. You have a fever. You have abdominal pain that gets worse or does not get better with medicine. You have a rash. You feel light-headed, have nausea and vomiting, or both. Get help right away if: You have pain in your chest or leg. You develop shortness of breath. You faint. You have increased or heavy vaginal bleeding, such as soaking a sanitary napkin in an hour. These symptoms may represent a serious problem that is an emergency. Do not wait to see if the symptoms will go away.  Get medical help right away. Call your local emergency services (911 in the [place] Do not drive yourself to the hospital. Summary After the procedure, it is common to feel tired, have pain in your abdomen, and have light vaginal bleeding for a few days. Follow instructions from your health care provider about how to take care of your incision or incisions. Return to your normal activities as told by your health care provider. Ask your health care provider what activities are safe for you. Do not douche, use tampons, or have sex until your health care provider approves. Keep all follow-up visits. This is important. This information is not intended to replace advice given to you by your health care provider. Make sure you discuss any questions you have with your health care provider. Document Revised: 10/06/2020 Document Reviewed: 10/06/2020 Elsevier Patient Education  2023 Elsevier Inc. General Anesthesia, Adult, Care After The following information offers guidance on how to care for yourself after your procedure. Your health care provider may also give you more specific instructions. If you have problems or questions, contact your health care provider. What can I expect after the procedure? After the procedure, it is common for people to: Have pain or discomfort at the IV  site. Have nausea or vomiting. Have a sore throat or hoarseness. Have trouble concentrating. Feel cold or chills. Feel weak, sleepy, or tired (fatigue). Have soreness and body aches. These can affect parts of the body that were not involved in surgery. Follow these instructions at home: For the time period you were told by your health care provider:  Rest. Do not participate in activities where you could fall or become injured. Do not drive or use machinery. Do not drink alcohol. Do not take sleeping pills or medicines that cause drowsiness. Do not make important decisions or sign legal documents. Do not take care of children on your own. General instructions Drink enough fluid to keep your urine pale yellow. If you have sleep apnea, surgery and certain medicines can increase your risk for breathing problems. Follow instructions from your health care provider about wearing your sleep device: Anytime you are sleeping, including during daytime naps. While taking prescription pain medicines, sleeping medicines, or medicines that make you drowsy. Return to your normal activities as told by your health care provider. Ask your health care provider what activities are safe for you. Take over-the-counter and prescription medicines only as told by your health care provider. Do not use any products that contain nicotine or tobacco. These products include cigarettes, chewing tobacco, and vaping devices, such as e-cigarettes. These can delay incision healing after surgery. If you need help quitting, ask your health care provider. Contact a health care provider if: You have nausea or vomiting that does not get better with medicine. You vomit every time you eat or drink. You have pain that does not get better with medicine. You cannot urinate or have bloody urine. You develop a skin rash. You have a fever. Get help right away if: You have trouble breathing. You have chest pain. You vomit  blood. These symptoms may be an emergency. Get help right away. Call 911. Do not wait to see if the symptoms will go away. Do not drive yourself to the hospital. Summary After the procedure, it is common to have a sore throat, hoarseness, nausea, vomiting, or to feel weak, sleepy, or fatigue. For the time period you were told by your health care provider, do not drive or use machinery. Get help  right away if you have difficulty breathing, have chest pain, or vomit blood. These symptoms may be an emergency. This information is not intended to replace advice given to you by your health care provider. Make sure you discuss any questions you have with your health care provider. Document Revised: 02/11/2022 Document Reviewed: 02/11/2022 Elsevier Patient Education  2023 Elsevier Inc. How to Use Chlorhexidine Before Surgery Chlorhexidine gluconate (CHG) is a germ-killing (antiseptic) solution that is used to clean the skin. It can get rid of the bacteria that normally live on the skin and can keep them away for about 24 hours. To clean your skin with CHG, you may be given: A CHG solution to use in the shower or as part of a sponge bath. A prepackaged cloth that contains CHG. Cleaning your skin with CHG may help lower the risk for infection: While you are staying in the intensive care unit of the hospital. If you have a vascular access, such as a central line, to provide short-term or long-term access to your veins. If you have a catheter to drain urine from your bladder. If you are on a ventilator. A ventilator is a machine that helps you breathe by moving air in and out of your lungs. After surgery. What are the risks? Risks of using CHG include: A skin reaction. Hearing loss, if CHG gets in your ears and you have a perforated eardrum. Eye injury, if CHG gets in your eyes and is not rinsed out. The CHG product catching fire. Make sure that you avoid smoking and flames after applying CHG to your  skin. Do not use CHG: If you have a chlorhexidine allergy or have previously reacted to chlorhexidine. On babies younger than 38 months of age. How to use CHG solution Use CHG only as told by your health care provider, and follow the instructions on the label. Use the full amount of CHG as directed. Usually, this is one bottle. During a shower Follow these steps when using CHG solution during a shower (unless your health care provider gives you different instructions): Start the shower. Use your normal soap and shampoo to wash your face and hair. Turn off the shower or move out of the shower stream. Pour the CHG onto a clean washcloth. Do not use any type of brush or rough-edged sponge. Starting at your neck, lather your body down to your toes. Make sure you follow these instructions: If you will be having surgery, pay special attention to the part of your body where you will be having surgery. Scrub this area for at least 1 minute. Do not use CHG on your head or face. If the solution gets into your ears or eyes, rinse them well with water. Avoid your genital area. Avoid any areas of skin that have broken skin, cuts, or scrapes. Scrub your back and under your arms. Make sure to wash skin folds. Let the lather sit on your skin for 1-2 minutes or as long as told by your health care provider. Thoroughly rinse your entire body in the shower. Make sure that all body creases and crevices are rinsed well. Dry off with a clean towel. Do not put any substances on your body afterward--such as powder, lotion, or perfume--unless you are told to do so by your health care provider. Only use lotions that are recommended by the manufacturer. Put on clean clothes or pajamas. If it is the night before your surgery, sleep in clean sheets.  During a sponge bath Follow  these steps when using CHG solution during a sponge bath (unless your health care provider gives you different instructions): Use your normal  soap and shampoo to wash your face and hair. Pour the CHG onto a clean washcloth. Starting at your neck, lather your body down to your toes. Make sure you follow these instructions: If you will be having surgery, pay special attention to the part of your body where you will be having surgery. Scrub this area for at least 1 minute. Do not use CHG on your head or face. If the solution gets into your ears or eyes, rinse them well with water. Avoid your genital area. Avoid any areas of skin that have broken skin, cuts, or scrapes. Scrub your back and under your arms. Make sure to wash skin folds. Let the lather sit on your skin for 1-2 minutes or as long as told by your health care provider. Using a different clean, wet washcloth, thoroughly rinse your entire body. Make sure that all body creases and crevices are rinsed well. Dry off with a clean towel. Do not put any substances on your body afterward--such as powder, lotion, or perfume--unless you are told to do so by your health care provider. Only use lotions that are recommended by the manufacturer. Put on clean clothes or pajamas. If it is the night before your surgery, sleep in clean sheets. How to use CHG prepackaged cloths Only use CHG cloths as told by your health care provider, and follow the instructions on the label. Use the CHG cloth on clean, dry skin. Do not use the CHG cloth on your head or face unless your health care provider tells you to. When washing with the CHG cloth: Avoid your genital area. Avoid any areas of skin that have broken skin, cuts, or scrapes. Before surgery Follow these steps when using a CHG cloth to clean before surgery (unless your health care provider gives you different instructions): Using the CHG cloth, vigorously scrub the part of your body where you will be having surgery. Scrub using a back-and-forth motion for 3 minutes. The area on your body should be completely wet with CHG when you are done  scrubbing. Do not rinse. Discard the cloth and let the area air-dry. Do not put any substances on the area afterward, such as powder, lotion, or perfume. Put on clean clothes or pajamas. If it is the night before your surgery, sleep in clean sheets.  For general bathing Follow these steps when using CHG cloths for general bathing (unless your health care provider gives you different instructions). Use a separate CHG cloth for each area of your body. Make sure you wash between any folds of skin and between your fingers and toes. Wash your body in the following order, switching to a new cloth after each step: The front of your neck, shoulders, and chest. Both of your arms, under your arms, and your hands. Your stomach and groin area, avoiding the genitals. Your right leg and foot. Your left leg and foot. The back of your neck, your back, and your buttocks. Do not rinse. Discard the cloth and let the area air-dry. Do not put any substances on your body afterward--such as powder, lotion, or perfume--unless you are told to do so by your health care provider. Only use lotions that are recommended by the manufacturer. Put on clean clothes or pajamas. Contact a health care provider if: Your skin gets irritated after scrubbing. You have questions about using your solution or  cloth. You swallow any chlorhexidine. Call your local poison control center ([phone] in the [place] Get help right away if: Your eyes itch badly, or they become very red or swollen. Your skin itches badly and is red or swollen. Your hearing changes. You have trouble seeing. You have swelling or tingling in your mouth or throat. You have trouble breathing. These symptoms may represent a serious problem that is an emergency. Do not wait to see if the symptoms will go away. Get medical help right away. Call your local emergency services (911 in the [place] Do not drive yourself to the hospital. Summary Chlorhexidine  gluconate (CHG) is a germ-killing (antiseptic) solution that is used to clean the skin. Cleaning your skin with CHG may help to lower your risk for infection. You may be given CHG to use for bathing. It may be in a bottle or in a prepackaged cloth to use on your skin. Carefully follow your health care provider's instructions and the instructions on the product label. Do not use CHG if you have a chlorhexidine allergy. Contact your health care provider if your skin gets irritated after scrubbing. This information is not intended to replace advice given to you by your health care provider. Make sure you discuss any questions you have with your health care provider. Document Revised: 03/14/2022 Document Reviewed: 01/25/2021 Elsevier Patient Education  2023 [employer].

## 2022-11-17 ENCOUNTER — Encounter: Payer: Self-pay | Admitting: Obstetrics & Gynecology

## 2022-11-18 ENCOUNTER — Encounter (HOSPITAL_COMMUNITY)
Admission: RE | Admit: 2022-11-18 | Discharge: 2022-11-18 | Disposition: A | Discharge status: Home / Self Care | Payer: Medicaid Other | Source: Ambulatory Visit | Attending: Obstetrics & Gynecology | Admitting: Obstetrics & Gynecology

## 2022-11-18 ENCOUNTER — Other Ambulatory Visit (HOSPITAL_COMMUNITY): Payer: Medicaid Other

## 2022-11-18 ENCOUNTER — Encounter (HOSPITAL_COMMUNITY): Payer: Self-pay

## 2022-11-18 VITALS — BP 118/89 | HR 104 | Temp 97.8°F | Resp 20 | Ht 59.0 in | Wt 245.0 lb

## 2022-11-18 DIAGNOSIS — Z01818 Encounter for other preprocedural examination: Secondary | ICD-10-CM | POA: Insufficient documentation

## 2022-11-18 DIAGNOSIS — R638 Other symptoms and signs concerning food and fluid intake: Secondary | ICD-10-CM | POA: Diagnosis not present

## 2022-11-18 DIAGNOSIS — R9431 Abnormal electrocardiogram [ECG] [EKG]: Secondary | ICD-10-CM | POA: Insufficient documentation

## 2022-11-18 LAB — CBC
HCT: 41.7 % (ref 36.0–46.0)
Hemoglobin: 14.2 g/dL (ref 12.0–15.0)
MCH: 29.6 pg (ref 26.0–34.0)
MCHC: 34.1 g/dL (ref 30.0–36.0)
MCV: 86.9 fL (ref 80.0–100.0)
Platelets: 383 10*3/uL (ref 150–400)
RBC: 4.8 MIL/uL (ref 3.87–5.11)
RDW: 12.4 % (ref 11.5–15.5)
WBC: 8.4 10*3/uL (ref 4.0–10.5)
nRBC: 0 % (ref 0.0–0.2)

## 2022-11-18 LAB — URINALYSIS, ROUTINE W REFLEX MICROSCOPIC
Bilirubin Urine: NEGATIVE
Glucose, UA: NEGATIVE mg/dL
Ketones, ur: NEGATIVE mg/dL
Nitrite: NEGATIVE
Protein, ur: 30 mg/dL — AB
Specific Gravity, Urine: 1.023 (ref 1.005–1.030)
pH: 5 (ref 5.0–8.0)

## 2022-11-18 LAB — RAPID HIV SCREEN (HIV 1/2 AB+AG)
HIV 1/2 Antibodies: NONREACTIVE
HIV-1 P24 Antigen - HIV24: NONREACTIVE

## 2022-11-18 LAB — COMPREHENSIVE METABOLIC PANEL
ALT: 29 U/L (ref 0–44)
AST: 20 U/L (ref 15–41)
Albumin: 4 g/dL (ref 3.5–5.0)
Alkaline Phosphatase: 61 U/L (ref 38–126)
Anion gap: 9 (ref 5–15)
BUN: 14 mg/dL (ref 6–20)
CO2: 22 mmol/L (ref 22–32)
Calcium: 9.1 mg/dL (ref 8.9–10.3)
Chloride: 108 mmol/L (ref 98–111)
Creatinine, Ser: 0.96 mg/dL (ref 0.44–1.00)
GFR, Estimated: >60 mL/min (ref >60)
Glucose, Bld: 100 mg/dL — ABNORMAL HIGH (ref 70–99)
Potassium: 4.3 mmol/L (ref 3.5–5.1)
Sodium: 139 mmol/L (ref 135–145)
Total Bilirubin: 0.4 mg/dL (ref 0.3–1.2)
Total Protein: 7.5 g/dL (ref 6.5–8.1)

## 2022-11-18 LAB — HCG, QUANTITATIVE, PREGNANCY: hCG, Beta Chain, Quant, S: <1 m[IU]/mL (ref <5)

## 2022-11-23 ENCOUNTER — Encounter (HOSPITAL_COMMUNITY)
Admission: RE | Disposition: A | Discharge status: Home / Self Care | Payer: Self-pay | Source: Home / Self Care | Attending: Obstetrics & Gynecology

## 2022-11-23 ENCOUNTER — Ambulatory Visit (HOSPITAL_COMMUNITY): Payer: Medicaid Other | Admitting: Anesthesiology

## 2022-11-23 ENCOUNTER — Other Ambulatory Visit: Payer: Self-pay

## 2022-11-23 ENCOUNTER — Ambulatory Visit (HOSPITAL_BASED_OUTPATIENT_CLINIC_OR_DEPARTMENT_OTHER): Payer: Medicaid Other | Admitting: Anesthesiology

## 2022-11-23 ENCOUNTER — Encounter (HOSPITAL_COMMUNITY): Payer: Self-pay | Admitting: Obstetrics & Gynecology

## 2022-11-23 ENCOUNTER — Ambulatory Visit (HOSPITAL_COMMUNITY)
Admission: RE | Admit: 2022-11-23 | Discharge: 2022-11-23 | Disposition: A | Discharge status: Home / Self Care | Payer: Medicaid Other | Attending: Obstetrics & Gynecology | Admitting: Obstetrics & Gynecology

## 2022-11-23 ENCOUNTER — Encounter: Payer: Self-pay | Admitting: Obstetrics & Gynecology

## 2022-11-23 DIAGNOSIS — Z302 Encounter for sterilization: Secondary | ICD-10-CM | POA: Insufficient documentation

## 2022-11-23 DIAGNOSIS — Z4002 Encounter for prophylactic removal of ovary: Secondary | ICD-10-CM

## 2022-11-23 DIAGNOSIS — F418 Other specified anxiety disorders: Secondary | ICD-10-CM

## 2022-11-23 DIAGNOSIS — Z6841 Body Mass Index (BMI) 40.0 and over, adult: Secondary | ICD-10-CM | POA: Insufficient documentation

## 2022-11-23 DIAGNOSIS — Z79899 Other long term (current) drug therapy: Secondary | ICD-10-CM | POA: Diagnosis not present

## 2022-11-23 DIAGNOSIS — I10 Essential (primary) hypertension: Secondary | ICD-10-CM

## 2022-11-23 DIAGNOSIS — Z01818 Encounter for other preprocedural examination: Secondary | ICD-10-CM

## 2022-11-23 DIAGNOSIS — E039 Hypothyroidism, unspecified: Secondary | ICD-10-CM | POA: Diagnosis not present

## 2022-11-23 HISTORY — PX: LAPAROSCOPIC BILATERAL SALPINGECTOMY: SHX5889

## 2022-11-23 SURGERY — SALPINGECTOMY, BILATERAL, LAPAROSCOPIC
Anesthesia: General | Site: Abdomen

## 2022-11-23 MED ORDER — PROPOFOL 10 MG/ML IV BOLUS
INTRAVENOUS | Status: DC | PRN
  Administered 2022-11-23: 20 mg via INTRAVENOUS
  Administered 2022-11-23: 150 mg via INTRAVENOUS

## 2022-11-23 MED ORDER — SODIUM CHLORIDE 0.9 % IR SOLN
Status: DC | PRN
  Administered 2022-11-23: 1000 mL

## 2022-11-23 MED ORDER — HYDROMORPHONE HCL 1 MG/ML IJ SOLN: 0.2500 mg | INTRAMUSCULAR | Status: DC | PRN

## 2022-11-23 MED ORDER — HYDROCODONE-ACETAMINOPHEN 5-325 MG PO TABS: 1.0000 | ORAL_TABLET | Freq: Four times a day (QID) | ORAL | 0 refills | Status: DC | PRN

## 2022-11-23 MED ORDER — ONDANSETRON HCL 4 MG/2ML IJ SOLN
INTRAMUSCULAR | Status: AC
  Filled 2022-11-23: qty 4

## 2022-11-23 MED ORDER — MIDAZOLAM HCL 2 MG/2ML IJ SOLN
INTRAMUSCULAR | Status: AC
  Filled 2022-11-23: qty 2

## 2022-11-23 MED ORDER — FENTANYL CITRATE (PF) 100 MCG/2ML IJ SOLN
INTRAMUSCULAR | Status: DC | PRN
  Administered 2022-11-23 (×2): 50 ug via INTRAVENOUS

## 2022-11-23 MED ORDER — CEFAZOLIN SODIUM-DEXTROSE 2-4 GM/100ML-% IV SOLN
2.0000 g | INTRAVENOUS | Status: AC
  Administered 2022-11-23: 2 g via INTRAVENOUS
  Filled 2022-11-23: qty 100

## 2022-11-23 MED ORDER — ONDANSETRON HCL 4 MG/2ML IJ SOLN: 4.0000 mg | Freq: Once | INTRAMUSCULAR | Status: DC | PRN

## 2022-11-23 MED ORDER — SUGAMMADEX SODIUM 200 MG/2ML IV SOLN
INTRAVENOUS | Status: DC | PRN
  Administered 2022-11-23 (×2): 50 mg via INTRAVENOUS
  Administered 2022-11-23: 25 mg via INTRAVENOUS
  Administered 2022-11-23: 100 mg via INTRAVENOUS

## 2022-11-23 MED ORDER — DIPHENHYDRAMINE HCL 50 MG/ML IJ SOLN
INTRAMUSCULAR | Status: AC
  Filled 2022-11-23: qty 1

## 2022-11-23 MED ORDER — CHLORHEXIDINE GLUCONATE 0.12 % MT SOLN
15.0000 mL | Freq: Once | OROMUCOSAL | Status: AC
  Administered 2022-11-23: 15 mL via OROMUCOSAL
  Filled 2022-11-23: qty 15

## 2022-11-23 MED ORDER — DIPHENHYDRAMINE HCL 50 MG/ML IJ SOLN
INTRAMUSCULAR | Status: DC | PRN
  Administered 2022-11-23: 6.25 mg via INTRAVENOUS

## 2022-11-23 MED ORDER — LIDOCAINE 2% (20 MG/ML) 5 ML SYRINGE
INTRAMUSCULAR | Status: DC | PRN
  Administered 2022-11-23: 100 mg via INTRAVENOUS

## 2022-11-23 MED ORDER — METOCLOPRAMIDE HCL 5 MG/ML IJ SOLN
10.0000 mg | Freq: Four times a day (QID) | INTRAMUSCULAR | Status: AC | PRN
  Administered 2022-11-23: 10 mg via INTRAVENOUS
  Filled 2022-11-23: qty 2

## 2022-11-23 MED ORDER — BUPIVACAINE LIPOSOME 1.3 % IJ SUSP
INTRAMUSCULAR | Status: DC | PRN
  Administered 2022-11-23: 20 mL

## 2022-11-23 MED ORDER — FENTANYL CITRATE (PF) 100 MCG/2ML IJ SOLN
INTRAMUSCULAR | Status: AC
  Filled 2022-11-23: qty 2

## 2022-11-23 MED ORDER — MIDAZOLAM HCL 5 MG/5ML IJ SOLN
INTRAMUSCULAR | Status: DC | PRN
  Administered 2022-11-23: 2 mg via INTRAVENOUS

## 2022-11-23 MED ORDER — SUGAMMADEX SODIUM 500 MG/5ML IV SOLN
INTRAVENOUS | Status: AC
  Filled 2022-11-23: qty 5

## 2022-11-23 MED ORDER — DEXMEDETOMIDINE HCL IN NACL 80 MCG/20ML IV SOLN
INTRAVENOUS | Status: DC | PRN
  Administered 2022-11-23 (×2): 4 ug via BUCCAL

## 2022-11-23 MED ORDER — POVIDONE-IODINE 10 % EX SWAB: 2.0000 | Freq: Once | CUTANEOUS | Status: DC

## 2022-11-23 MED ORDER — KETOROLAC TROMETHAMINE 10 MG PO TABS: 10.0000 mg | ORAL_TABLET | Freq: Three times a day (TID) | ORAL | 0 refills | Status: DC | PRN

## 2022-11-23 MED ORDER — DEXAMETHASONE SODIUM PHOSPHATE 10 MG/ML IJ SOLN
INTRAMUSCULAR | Status: DC | PRN
  Administered 2022-11-23: 10 mg via INTRAVENOUS

## 2022-11-23 MED ORDER — LACTATED RINGERS IV SOLN: INTRAVENOUS | Status: DC

## 2022-11-23 MED ORDER — KETOROLAC TROMETHAMINE 30 MG/ML IJ SOLN
30.0000 mg | Freq: Once | INTRAMUSCULAR | Status: AC
  Administered 2022-11-23: 30 mg via INTRAVENOUS
  Filled 2022-11-23: qty 1

## 2022-11-23 MED ORDER — ROCURONIUM BROMIDE 100 MG/10ML IV SOLN
INTRAVENOUS | Status: DC | PRN
  Administered 2022-11-23: 60 mg via INTRAVENOUS

## 2022-11-23 MED ORDER — ONDANSETRON 8 MG PO TBDP: 8.0000 mg | ORAL_TABLET | Freq: Three times a day (TID) | ORAL | 0 refills | Status: DC | PRN

## 2022-11-23 MED ORDER — ORAL CARE MOUTH RINSE: 15.0000 mL | Freq: Once | OROMUCOSAL | Status: AC

## 2022-11-23 MED ORDER — ONDANSETRON HCL 4 MG/2ML IJ SOLN
INTRAMUSCULAR | Status: DC | PRN
  Administered 2022-11-23 (×2): 4 mg via INTRAVENOUS

## 2022-11-23 MED ORDER — BUPIVACAINE LIPOSOME 1.3 % IJ SUSP
20.0000 mL | Freq: Once | INTRAMUSCULAR | Status: DC
  Filled 2022-11-23: qty 20

## 2022-11-23 MED ORDER — BUPIVACAINE LIPOSOME 1.3 % IJ SUSP
INTRAMUSCULAR | Status: AC
  Filled 2022-11-23: qty 20

## 2022-11-23 MED ORDER — MEPERIDINE HCL 50 MG/ML IJ SOLN: 6.2500 mg | INTRAMUSCULAR | Status: DC | PRN

## 2022-11-23 MED ORDER — DEXAMETHASONE SODIUM PHOSPHATE 10 MG/ML IJ SOLN
INTRAMUSCULAR | Status: AC
  Filled 2022-11-23: qty 1

## 2022-11-23 SURGICAL SUPPLY — 40 items
ADH SKN CLS APL DERMABOND .7 (GAUZE/BANDAGES/DRESSINGS) ×1
APPLIER CLIP 5 13 M/L LIGAMAX5 (MISCELLANEOUS)
APR CLP MED LRG 5 ANG JAW (MISCELLANEOUS)
BAG HAMPER (MISCELLANEOUS) ×1 IMPLANT
BLADE SURG SZ11 CARB STEEL (BLADE) ×1 IMPLANT
CLIP APPLIE 5 13 M/L LIGAMAX5 (MISCELLANEOUS) IMPLANT
CLOTH BEACON ORANGE TIMEOUT ST (SAFETY) ×1 IMPLANT
COVER LIGHT HANDLE STERIS (MISCELLANEOUS) ×2 IMPLANT
DERMABOND ADVANCED .7 DNX12 (GAUZE/BANDAGES/DRESSINGS) ×1 IMPLANT
ELECT REM PT RETURN 9FT ADLT (ELECTROSURGICAL) ×1
ELECTRODE REM PT RTRN 9FT ADLT (ELECTROSURGICAL) ×1 IMPLANT
GAUZE 4X4 16PLY ~~LOC~~+RFID DBL (SPONGE) ×2 IMPLANT
GLOVE BIOGEL M 6.5 STRL (GLOVE) IMPLANT
GLOVE BIOGEL PI IND STRL 7.0 (GLOVE) ×2 IMPLANT
GLOVE BIOGEL PI IND STRL 8 (GLOVE) ×1 IMPLANT
GLOVE ECLIPSE 8.0 STRL XLNG CF (GLOVE) ×2 IMPLANT
GOWN STRL REUS W/TWL LRG LVL3 (GOWN DISPOSABLE) ×1 IMPLANT
GOWN STRL REUS W/TWL XL LVL3 (GOWN DISPOSABLE) ×1 IMPLANT
INST SET LAPROSCOPIC GYN AP (KITS) ×1 IMPLANT
KIT TURNOVER CYSTO (KITS) ×1 IMPLANT
NDL HYPO 18GX1.5 BLUNT FILL (NEEDLE) ×1 IMPLANT
NDL HYPO 21X1.5 SAFETY (NEEDLE) ×1 IMPLANT
NDL INSUFFLATION 14GA 120MM (NEEDLE) ×1 IMPLANT
NEEDLE HYPO 18GX1.5 BLUNT FILL (NEEDLE) ×1 IMPLANT
NEEDLE HYPO 21X1.5 SAFETY (NEEDLE) ×1 IMPLANT
NEEDLE INSUFFLATION 14GA 120MM (NEEDLE) ×1 IMPLANT
PACK PERI GYN (CUSTOM PROCEDURE TRAY) ×1 IMPLANT
PAD ARMBOARD 7.5X6 YLW CONV (MISCELLANEOUS) ×1 IMPLANT
SET BASIN LINEN APH (SET/KITS/TRAYS/PACK) ×1 IMPLANT
SET TUBE SMOKE EVAC HIGH FLOW (TUBING) ×1 IMPLANT
SHEARS HARMONIC ACE PLUS 36CM (ENDOMECHANICALS) ×1 IMPLANT
SLEEVE Z-THREAD 5X100MM (TROCAR) ×1 IMPLANT
SOL ANTI FOG 6CC (MISCELLANEOUS) ×1 IMPLANT
SUT VICRYL 0 UR6 27IN ABS (SUTURE) ×1 IMPLANT
SUT VICRYL AB 3-0 FS1 BRD 27IN (SUTURE) ×1 IMPLANT
SYR 10ML LL (SYRINGE) ×1 IMPLANT
SYR 20ML LL LF (SYRINGE) ×2 IMPLANT
TROCAR Z-THRD FIOS HNDL 11X100 (TROCAR) ×1 IMPLANT
TROCAR Z-THREAD FIOS 5X100MM (TROCAR) ×1 IMPLANT
WARMER LAPAROSCOPE (MISCELLANEOUS) ×1 IMPLANT

## 2022-11-23 NOTE — Anesthesia Postprocedure Evaluation (Signed)
Anesthesia Post Note  Patient: Tara Mcmahon  Procedure(s) Performed: LAPAROSCOPIC BILATERAL SALPINGECTOMY (Abdomen)  Patient location during evaluation: Phase II Anesthesia Type: General Level of consciousness: awake and alert and oriented Pain management: pain level controlled Vital Signs Assessment: post-procedure vital signs reviewed and stable Respiratory status: spontaneous breathing, nonlabored ventilation and respiratory function stable Cardiovascular status: blood pressure returned to baseline and stable Postop Assessment: no apparent nausea or vomiting Anesthetic complications: no  No notable events documented.   Last Vitals:  Vitals:   11/23/22 1400 11/23/22 1419  BP: 118/83 121/89  Pulse: 85 82  Resp: (!) 6 16  Temp:  36.7 C  SpO2: 100% 100%    Last Pain:  Vitals:   11/23/22 1419  TempSrc: Oral  PainSc: 4                  Shanna Strength C Seth Friedlander

## 2022-11-23 NOTE — H&P (Signed)
Preoperative History and Physical  Tara Mcmahon is a 26 y.o. [id number] with No LMP recorded. (Menstrual status: IUD). admitted for a laparoscopic bilateral salpingectomy for permanent sterilization.    PMH:    Past Medical History:  Diagnosis Date   Anemia    Anxiety    Cervical incompetence during pregnancy    Depression    Hypothyroidism    PCOS (polycystic ovarian syndrome)    Pregnancy induced hypertension    With second pregnancy    PSH:     Past Surgical History:  Procedure Laterality Date   CERVICAL CERCLAGE     CERVICAL CERCLAGE N/A 03/09/2016   Procedure: [facility] CERCLAGE PLACEMENT;  Surgeon: Lazaro Arms, MD;  Location: [facility];  Service: Gynecology;  Laterality: N/A;   CERVICAL CERCLAGE N/A 01/31/2018   Procedure: CERCLAGE CERVICAL;  Surgeon: Lazaro Arms, MD;  Location: [facility];  Service: Gynecology;  Laterality: N/A;   CERVICAL CERCLAGE     Placed in 2013, 2017, and 2019   CERVICAL CERCLAGE N/A 03/13/2020   Procedure: CERCLAGE CERVICAL ([place]);  Surgeon: Lazaro Arms, MD;  Location: [facility];  Service: Gynecology;  Laterality: N/A;   CESAREAN SECTION N/A 08/21/2020   Procedure: CESAREAN SECTION;  Surgeon: Adam Phenix, MD;  Location: [facility] LD [facility]S;  Service: Obstetrics;  Laterality: N/A;   labial reduction      POb/GynH:      OB History     Gravida  5   Para  4   Term  3   Preterm  1   AB  1   Living  4      SAB  1   IAB      Ectopic      Multiple  0   Live Births  4           SH:   Social History   Tobacco Use   Smoking status: Never   Smokeless tobacco: Never  Vaping Use   Vaping Use: Never used  Substance Use Topics   Alcohol use: Not Currently    Comment: occ; not now   Drug use: No    FH:    Family History  Problem Relation Age of Onset   Arthritis Mother    Lupus Mother    Autoimmune disease Mother    Crohn's disease Mother    Cancer Maternal Grandmother        breast   Heart disease Maternal Grandfather     Heart disease Paternal Grandfather    Hypertension Paternal Grandfather    Lupus Maternal Aunt      Allergies:  Allergies  Allergen Reactions   Shellfish Allergy Anaphylaxis and Swelling    Only Crawfish not allergic to any other shellfish     Medications:       Current Facility-Administered Medications:    bupivacaine liposome (EXPAREL) 1.3 % injection 266 mg, 20 mL, Infiltration, Once, Drakkar Medeiros, Amaryllis Dyke, MD   ceFAZolin (ANCEF) IVPB 2g/100 mL premix, 2 g, Intravenous, On Call to OR, Lazaro Arms, MD   lactated ringers infusion, , Intravenous, Continuous, Battula, Rajamani C, MD, Last Rate: 50 mL/hr at 11/23/22 1033, New Bag at 11/23/22 1033   povidone-iodine 10 % swab 2 Application, 2 Application, Topical, Once, Lazaro Arms, MD  Review of Systems:   Review of Systems  Constitutional: Negative for fever, chills, weight loss, malaise/fatigue and diaphoresis.  HENT: Negative for hearing loss, ear pain, nosebleeds, congestion, sore throat, neck pain,  tinnitus and ear discharge.   Eyes: Negative for blurred vision, double vision, photophobia, pain, discharge and redness.  Respiratory: Negative for cough, hemoptysis, sputum production, shortness of breath, wheezing and stridor.   Cardiovascular: Negative for chest pain, palpitations, orthopnea, claudication, leg swelling and PND.  Gastrointestinal: Positive for abdominal pain. Negative for heartburn, nausea, vomiting, diarrhea, constipation, blood in stool and melena.  Genitourinary: Negative for dysuria, urgency, frequency, hematuria and flank pain.  Musculoskeletal: Negative for myalgias, back pain, joint pain and falls.  Skin: Negative for itching and rash.  Neurological: Negative for dizziness, tingling, tremors, sensory change, speech change, focal weakness, seizures, loss of consciousness, weakness and headaches.  Endo/Heme/Allergies: Negative for environmental allergies and polydipsia. Does not bruise/bleed easily.   Psychiatric/Behavioral: Negative for depression, suicidal ideas, hallucinations, memory loss and substance abuse. The patient is not nervous/anxious and does not have insomnia.      PHYSICAL EXAM:  Blood pressure 130/79, pulse 97, temperature 98 F (36.7 C), temperature source Oral, resp. rate 18, SpO2 98 %.    Vitals reviewed. Constitutional: She is oriented to person, place, and time. She appears well-developed and well-nourished.  HENT:  Head: Normocephalic and atraumatic.  Right Ear: External ear normal.  Left Ear: External ear normal.  Nose: Nose normal.  Mouth/Throat: Oropharynx is clear and moist.  Eyes: Conjunctivae and EOM are normal. Pupils are equal, round, and reactive to light. Right eye exhibits no discharge. Left eye exhibits no discharge. No scleral icterus.  Neck: Normal range of motion. Neck supple. No tracheal deviation present. No thyromegaly present.  Cardiovascular: Normal rate, regular rhythm, normal heart sounds and intact distal pulses.  Exam reveals no gallop and no friction rub.   No murmur heard. Respiratory: Effort normal and breath sounds normal. No respiratory distress. She has no wheezes. She has no rales. She exhibits no tenderness.  GI: Soft. Bowel sounds are normal. She exhibits no distension and no mass. There is tenderness. There is no rebound and no guarding.  Genitourinary:       Vulva is normal without lesions Vagina is pink moist without discharge Cervix normal in appearance and pap is normal Uterus is normal size, contour, position, consistency, mobility, non-tender Adnexa is negative with normal sized ovaries by sonogram  Musculoskeletal: Normal range of motion. She exhibits no edema and no tenderness.  Neurological: She is alert and oriented to person, place, and time. She has normal reflexes. She displays normal reflexes. No cranial nerve deficit. She exhibits normal muscle tone. Coordination normal.  Skin: Skin is warm and dry. No rash  noted. No erythema. No pallor.  Psychiatric: She has a normal mood and affect. Her behavior is normal. Judgment and thought content normal.    Labs: Results for orders placed or performed during the hospital encounter of 11/18/22 (from the past 336 hour(s))  CBC   Collection Time: 11/18/22  3:11 PM  Result Value Ref Range   WBC 8.4 4.0 - 10.5 K/uL   RBC 4.80 3.87 - 5.11 MIL/uL   Hemoglobin 14.2 12.0 - 15.0 g/dL   HCT 03.5 46.5 - 68.1 %   MCV 86.9 80.0 - 100.0 fL   MCH 29.6 26.0 - 34.0 pg   MCHC 34.1 30.0 - 36.0 g/dL   RDW 27.5 17.0 - 01.7 %   Platelets 383 150 - 400 K/uL   nRBC 0.0 0.0 - 0.2 %  Rapid HIV screen (HIV 1/2 Ab+Ag)   Collection Time: 11/18/22  3:11 PM  Result Value Ref Range  HIV-1 P24 Antigen - HIV24 NON REACTIVE NON REACTIVE   HIV 1/2 Antibodies NON REACTIVE NON REACTIVE   Interpretation (HIV Ag Ab)      A non reactive test result means that HIV 1 or HIV 2 antibodies and HIV 1 p24 antigen were not detected in the specimen.  Comprehensive metabolic panel   Collection Time: 11/18/22  3:11 PM  Result Value Ref Range   Sodium 139 135 - 145 mmol/L   Potassium 4.3 3.5 - 5.1 mmol/L   Chloride 108 98 - 111 mmol/L   CO2 22 22 - 32 mmol/L   Glucose, Bld 100 (H) 70 - 99 mg/dL   BUN 14 6 - 20 mg/dL   Creatinine, Ser 2.97 0.44 - 1.00 mg/dL   Calcium 9.1 8.9 - 98.9 mg/dL   Total Protein 7.5 6.5 - 8.1 g/dL   Albumin 4.0 3.5 - 5.0 g/dL   AST 20 15 - 41 U/L   ALT 29 0 - 44 U/L   Alkaline Phosphatase 61 38 - 126 U/L   Total Bilirubin 0.4 0.3 - 1.2 mg/dL   GFR, Estimated >21 >19 mL/min   Anion gap 9 5 - 15  hCG, Quantitative, Pregnancy (serum - [facility])   Collection Time: 11/18/22  3:11 PM  Result Value Ref Range   hCG, Beta Chain, Quant, S <1 <5 mIU/mL  Urinalysis, Routine w reflex microscopic Urine, Clean Catch   Collection Time: 11/18/22  3:48 PM  Result Value Ref Range   Color, Urine YELLOW YELLOW   APPearance HAZY (A) CLEAR   Specific Gravity, Urine 1.023 1.005 -  1.030   pH 5.0 5.0 - 8.0   Glucose, UA NEGATIVE NEGATIVE mg/dL   Hgb urine dipstick SMALL (A) NEGATIVE   Bilirubin Urine NEGATIVE NEGATIVE   Ketones, ur NEGATIVE NEGATIVE mg/dL   Protein, ur 30 (A) NEGATIVE mg/dL   Nitrite NEGATIVE NEGATIVE   Leukocytes,Ua TRACE (A) NEGATIVE   RBC / HPF 0-5 0 - 5 RBC/hpf   WBC, UA 11-20 0 - 5 WBC/hpf   Bacteria, UA RARE (A) NONE SEEN   Squamous Epithelial / LPF 0-5 0 - 5   Mucus PRESENT     EKG: Orders placed or performed during the hospital encounter of 11/18/22   EKG 12-LEAD   EKG 12-LEAD    Imaging Studies: No results found.    Assessment: Multiparous female desires permanent sterilization, opts for salpingectomy for ovarian cancer prophylaxis  Plan: Laparoscopic bilateral salpingectomy for permanent sterilization  Lazaro Arms 11/23/2022 11:46 AM

## 2022-11-23 NOTE — Progress Notes (Signed)
Patient has a small split in the middle of her lower lip which could have came from tube placement. Patient is not complaining of any pain to her lip.

## 2022-11-23 NOTE — Transfer of Care (Signed)
Immediate Anesthesia Transfer of Care Note  Patient: Tara Mcmahon  Procedure(s) Performed: LAPAROSCOPIC BILATERAL SALPINGECTOMY (Abdomen)  Patient Location: PACU  Anesthesia Type:General  Level of Consciousness: awake and patient cooperative  Airway & Oxygen Therapy: Patient Spontanous Breathing  Post-op Assessment: Report given to RN and Post -op Vital signs reviewed and stable  Post vital signs: Reviewed and stable  Last Vitals:  Vitals Value Taken Time  BP 125/90 11/23/22 1330  Temp    Pulse 96 11/23/22 1332  Resp 16 11/23/22 1332  SpO2 100 % 11/23/22 1332  Vitals shown include unvalidated device data.  Last Pain:  Vitals:   11/23/22 1023  TempSrc: Oral  PainSc: 0-No pain         Complications: No notable events documented.

## 2022-11-23 NOTE — Anesthesia Procedure Notes (Signed)
Procedure Name: Intubation Date/Time: 11/23/2022 12:27 PM  Performed by: Gwyndolyn Saxon, CRNAPre-anesthesia Checklist: Patient identified, Emergency Drugs available, Suction available and Patient being monitored Patient Re-evaluated:Patient Re-evaluated prior to induction Oxygen Delivery Method: Circle system utilized Preoxygenation: Pre-oxygenation with 100% oxygen Induction Type: IV induction Ventilation: Mask ventilation without difficulty Laryngoscope Size: Mac and 3 Grade View: Grade II Tube type: Oral Number of attempts: 1 Airway Equipment and Method: Patient positioned with wedge pillow and Stylet Placement Confirmation: ETT inserted through vocal cords under direct vision, positive ETCO2 and breath sounds checked- equal and bilateral Secured at: 21 cm Tube secured with: Tape Dental Injury: Teeth and Oropharynx as per pre-operative assessment  Comments: Lips are dry and cracked prior to DL

## 2022-11-23 NOTE — Op Note (Signed)
Preoperative Diagnosis:  1.  Multiparous female desires permanent sterilization                                          2.  Elects to have bilateral salpingectomy for ovarian cancer prophylaxis  Postoperative Diagnosis:  Same as above  Procedure:  Laparoscopic Bilateral Salpingectomy for the purpose of permanent sterilization  Surgeon:  Rockne Coons MD  Anaesthesia: general  Findings:  Patient had normal pelvic anatomy and no intraperitoneal abnormalities.  Description of Operation:  Patient was taken to the OR and placed into supine position where she underwent general anaesthesia.   She was placed in the dorsal lithotomy position and prepped and draped in the usual sterile fashion.   An incision was made in the umbilicus and dissection taken down to the rectus fascia. A Veres needle was used to insufflate the periotneal cavity. An 11 mm non bladed video laparoscope trocar was then placed under direct visualization without difficulty.   The above noted findings were observed.   Two additional 5 mm non bladed trocars were placed in the right and left lower quadrants under direct visualization without difficulty.   The Harmonic scalpel was employed and a salpingectomy of both the right and left tubes was performed.   The tubes were removed from the peritoneal cavity and sent to pathology.   There was good hemostasis bilaterally.   The fascia, peritoneum and subcutaneous tissue were closed using 0 vicryl.   All 3 skin incisions were closed using 3-0 vicryl in a subcuticular fashion.  Exparel 266 mg 20 cc was injected in the 3 incisional/trocar sites.  The patient was awakened from anaesthesia and taken to the PACU with all counts being correct x 3.   The patient received  2 gram of ancef andToradol 30 mg IV preoperatively.  Tara Mcmahon 11/23/2022 1:20 PM

## 2022-11-23 NOTE — Anesthesia Preprocedure Evaluation (Addendum)
Anesthesia Evaluation  Patient identified by MRN, date of birth, ID band Patient awake    Reviewed: Allergy & Precautions, H&P , NPO status , Patient's Chart, lab work & pertinent test results  Airway Mallampati: I  TM Distance: >3 FB Neck ROM: Full    Dental  (+) Teeth Intact   Pulmonary neg pulmonary ROS   breath sounds clear to auscultation       Cardiovascular hypertension, Pt. on medications negative cardio ROS  Rhythm:Regular     Neuro/Psych  PSYCHIATRIC DISORDERS Anxiety Depression    negative neurological ROS     GI/Hepatic negative GI ROS, Neg liver ROS,,,  Endo/Other  Hypothyroidism  Morbid obesity  Renal/GU negative Renal ROS  negative genitourinary   Musculoskeletal negative musculoskeletal ROS (+)    Abdominal   Peds negative pediatric ROS (+)  Hematology  (+) Blood dyscrasia, anemia   Anesthesia Other Findings   Reproductive/Obstetrics negative OB ROS                             Anesthesia Physical Anesthesia Plan  ASA: 3  Anesthesia Plan: General   Post-op Pain Management:    Induction:   PONV Risk Score and Plan: 3 and Ondansetron and Dexamethasone  Airway Management Planned: Oral ETT  Additional Equipment:   Intra-op Plan:   Post-operative Plan: Extubation in OR  Informed Consent: I have reviewed the patients History and Physical, chart, labs and discussed the procedure including the risks, benefits and alternatives for the proposed anesthesia with the patient or authorized representative who has indicated his/her understanding and acceptance.     Dental advisory given  Plan Discussed with: CRNA and Surgeon  Anesthesia Plan Comments:         Anesthesia Quick Evaluation

## 2022-11-24 LAB — SURGICAL PATHOLOGY

## 2022-11-30 ENCOUNTER — Encounter (HOSPITAL_COMMUNITY): Payer: Self-pay | Admitting: Obstetrics & Gynecology

## 2022-11-30 NOTE — Addendum Note (Signed)
Addendum  created 11/30/22 1334 by Karna Dupes, CRNA   Intraprocedure Staff edited

## 2022-12-01 ENCOUNTER — Encounter: Payer: Medicaid Other | Admitting: Obstetrics & Gynecology

## 2022-12-02 ENCOUNTER — Encounter: Payer: Medicaid Other | Admitting: Obstetrics & Gynecology

## 2022-12-12 ENCOUNTER — Ambulatory Visit (INDEPENDENT_AMBULATORY_CARE_PROVIDER_SITE_OTHER): Payer: Self-pay | Admitting: Obstetrics & Gynecology

## 2022-12-12 ENCOUNTER — Encounter: Payer: Self-pay | Admitting: Obstetrics & Gynecology

## 2022-12-12 VITALS — BP 129/82 | HR 89 | Ht 59.0 in | Wt 258.0 lb

## 2022-12-12 DIAGNOSIS — Z9889 Other specified postprocedural states: Secondary | ICD-10-CM

## 2022-12-12 NOTE — Progress Notes (Signed)
  HPI: Patient returns for routine postoperative follow-up having undergone Laparoscopic bilateral salpingectomyt on 11/23/22.  The patient's immediate postoperative recovery has been unremarkable. Since hospital discharge the patient reports no problems, also wants IUD removed.   No current outpatient medications on file. No current facility-administered medications for this visit.    Blood pressure 129/82, pulse 89, height 4\' 11"  (1.499 m), weight 258 lb (117 kg).  Physical Exam: 3 incisions look godd umbilical a little irritated gentian violet placed  Diagnostic Tests:   Pathology: Benign  IUD removed without difficulty  Impression + Management plan:   ICD-10-CM   1. Post-operative state  Z98.890    IUD removed per patient request         Medications Prescribed this encounter: No orders of the defined types were placed in this encounter.     Follow up: No follow-ups on file.    Florian Buff, MD Attending Physician for the [facility] 12/12/2022 3:39 PM

## 2023-03-01 ENCOUNTER — Encounter: Payer: Self-pay | Admitting: Obstetrics & Gynecology

## 2023-03-01 MED ORDER — NORETHINDRONE 0.35 MG PO TABS: 1.0000 | ORAL_TABLET | Freq: Every day | ORAL | 11 refills | Status: AC

## 2023-03-13 MED ORDER — MEGESTROL ACETATE 40 MG PO TABS: ORAL_TABLET | ORAL | 3 refills | Status: AC

## 2023-03-13 NOTE — Addendum Note (Signed)
Addended by: Lazaro Arms on: 03/13/2023 11:10 AM   Modules accepted: Orders

## 2023-03-21 ENCOUNTER — Telehealth: Payer: Self-pay | Admitting: Physician Assistant

## 2023-03-21 DIAGNOSIS — R3989 Other symptoms and signs involving the genitourinary system: Secondary | ICD-10-CM

## 2023-03-21 MED ORDER — NITROFURANTOIN MONOHYD MACRO 100 MG PO CAPS: 100.0000 mg | ORAL_CAPSULE | Freq: Two times a day (BID) | ORAL | 0 refills | Status: DC

## 2023-03-21 MED ORDER — CEPHALEXIN 500 MG PO CAPS: 500.0000 mg | ORAL_CAPSULE | Freq: Two times a day (BID) | ORAL | 0 refills | Status: AC

## 2023-03-21 NOTE — Progress Notes (Signed)

## 2023-03-21 NOTE — Progress Notes (Signed)
I have spent 5 minutes in review of e-visit questionnaire, review and updating patient chart, medical decision making and response to patient.   Alecea Trego Cody Zera Markwardt, PA-C    

## 2023-03-21 NOTE — Addendum Note (Signed)
Addended by: Waldon Merl on: 03/21/2023 07:31 AM   Modules accepted: Orders

## 2023-04-03 ENCOUNTER — Telehealth: Payer: Medicaid Other | Admitting: Physician Assistant

## 2023-04-03 DIAGNOSIS — N390 Urinary tract infection, site not specified: Secondary | ICD-10-CM

## 2023-04-03 NOTE — Progress Notes (Signed)
Because you have failed a first-line treatment, I feel your condition warrants further evaluation and I recommend that you be seen in a face to face visit for a urine culture test to be obtained to determine if you have a resistant bacteria.   NOTE: There will be NO CHARGE for this eVisit   If you are having a true medical emergency please call 911.      For an urgent face to face visit, [facility] has eight urgent care centers for your convenience:   NEW!! [facility] Center at [facility] Get Driving Directions [phone] [address], [address] [place], [address]    [facility] Center at [facility] Get Driving Directions [phone] [address] [place], [place] [address]   [facility]gent Care Center [facility]) Get Driving Directions [phone] [address] [place], [place] [address]  [facility]gent [facility] Center [facility]) Get Driving Directions [phone] [address] [place],  [place]  [address]  [facility] Health Urgent Care Center [facility] - at [employer]  [phone] [address] [place],  [place] [address]   [facility] Health Urgent Care at [facility] Get Driving Directions [phone] [address], [address] [place], [place] [address]   [facility] Health Urgent Care at [facility] Get Driving Directions  [phone] [address] [place], [place] [address]   [facility] Health Urgent Care at [facility] Directions [phone] [address], [address] [place], [place] [address]  Your MyChart E-visit questionnaire answers were reviewed by a board certified advanced clinical practitioner to complete your personal care plan based on your specific symptoms.  Thank you for using e-Visits.   I have spent 5 minutes in review of e-visit questionnaire, review and updating patient chart, medical  decision making and response to patient.   Margaretann Loveless, PA-C

## 2023-04-04 ENCOUNTER — Ambulatory Visit: Payer: Medicaid Other

## 2023-04-11 ENCOUNTER — Other Ambulatory Visit: Payer: Medicaid Other

## 2023-04-13 ENCOUNTER — Other Ambulatory Visit (INDEPENDENT_AMBULATORY_CARE_PROVIDER_SITE_OTHER): Payer: Medicaid Other

## 2023-04-13 DIAGNOSIS — R300 Dysuria: Secondary | ICD-10-CM | POA: Diagnosis not present

## 2023-04-13 DIAGNOSIS — R8290 Unspecified abnormal findings in urine: Secondary | ICD-10-CM | POA: Diagnosis not present

## 2023-04-13 LAB — POCT URINALYSIS DIPSTICK OB
Glucose, UA: NEGATIVE
Ketones, UA: NEGATIVE
Nitrite, UA: NEGATIVE

## 2023-04-13 NOTE — Progress Notes (Signed)
   NURSE VISIT- UTI SYMPTOMS   SUBJECTIVE:  Tara Mcmahon is a 27 y.o. [phone] female here for UTI symptoms. She is a GYN patient. She reports cloudy urine, dysuria, and odor . Recently had an E-visit on 04/03/23 for similar symptoms and was prescribed Macrobid in which she took.  Symptoms seemed to get better but have returned.   OBJECTIVE:  There were no vitals taken for this visit.  Appears well, in no apparent distress  Results for orders placed or performed in visit on 04/13/23 (from the past 24 hour(s))  POC Urinalysis Dipstick OB   Collection Time: 04/13/23 11:41 AM  Result Value Ref Range   Color, UA     Clarity, UA     Glucose, UA Negative Negative   Bilirubin, UA     Ketones, UA neg    Spec Grav, UA     Blood, UA moderate    pH, UA     POC,PROTEIN,UA Small (1+) Negative, Trace, Small (1+), Moderate (2+), Large (3+), 4+   Urobilinogen, UA     Nitrite, UA negative    Leukocytes, UA Trace (A) Negative   Appearance     Odor      ASSESSMENT: GYN patient with UTI symptoms and negative nitrites  PLAN: Note routed to Cyril Mourning, AGNP   Rx sent by provider today: No Urine culture sent Call or return to clinic prn if these symptoms worsen or fail to improve as anticipated. Follow-up: as needed   Jobe Marker  04/13/2023 11:49 AM

## 2023-04-16 ENCOUNTER — Encounter: Payer: Self-pay | Admitting: Advanced Practice Midwife

## 2023-04-16 LAB — URINE CULTURE

## 2023-04-17 ENCOUNTER — Other Ambulatory Visit: Payer: Self-pay | Admitting: Adult Health

## 2023-04-17 MED ORDER — SULFAMETHOXAZOLE-TRIMETHOPRIM 800-160 MG PO TABS: 1.0000 | ORAL_TABLET | Freq: Two times a day (BID) | ORAL | 0 refills | Status: DC

## 2023-04-17 NOTE — Progress Notes (Signed)
+   E coli will rx septra ds

## 2023-05-10 ENCOUNTER — Encounter: Payer: Self-pay | Admitting: Obstetrics & Gynecology

## 2023-05-15 ENCOUNTER — Telehealth: Payer: Medicaid Other | Admitting: Physician Assistant

## 2023-05-15 DIAGNOSIS — N61 Mastitis without abscess: Secondary | ICD-10-CM

## 2023-05-15 MED ORDER — DOXYCYCLINE HYCLATE 100 MG PO TABS: 100.0000 mg | ORAL_TABLET | Freq: Two times a day (BID) | ORAL | 0 refills | Status: AC

## 2023-05-15 MED ORDER — CEPHALEXIN 500 MG PO CAPS: 500.0000 mg | ORAL_CAPSULE | Freq: Four times a day (QID) | ORAL | 0 refills | Status: DC

## 2023-05-15 NOTE — Progress Notes (Signed)
E Visit for Cellulitis  We are sorry that you are not feeling well. Here is how we plan to help!  Based on what you shared with me it looks like you have cellulitis.  Cellulitis looks like areas of skin redness, swelling, and warmth; it develops as a result of bacteria entering under the skin. Little red spots and/or bleeding can be seen in skin, and tiny surface sacs containing fluid can occur. Fever can be present. Cellulitis is almost always on one side of a body, and the lower limbs are the most common site of involvement.   I have prescribed:  Keflex 500mg take one by mouth four times a day for 5 days.  HOME CARE:  . Take your medications as ordered and take all of them, even if the skin irritation appears to be healing.   GET HELP RIGHT AWAY IF:  . Symptoms that don't begin to go away within 48 hours. . Severe redness persists or worsens . If the area turns color, spreads or swells. . If it blisters and opens, develops yellow-brown crust or bleeds. . You develop a fever or chills. . If the pain increases or becomes unbearable.  . Are unable to keep fluids and food down.  MAKE SURE YOU    Understand these instructions.  Will watch your condition.  Will get help right away if you are not doing well or get worse.  Thank you for choosing an e-visit. Your e-visit answers were reviewed by a board certified advanced clinical practitioner to complete your personal care plan. Depending upon the condition, your plan could have included both over the counter or prescription medications. Please review your pharmacy choice. Make sure the pharmacy is open so you can pick up prescription now. If there is a problem, you may contact your provider through MyChart messaging and have the prescription routed to another pharmacy. Your safety is important to us. If you have drug allergies check your prescription carefully.  For the next 24 hours you can use MyChart to ask questions about today's  visit, request a non-urgent call back, or ask for a work or school excuse. You will get an email in the next two days asking about your experience. I hope that your e-visit has been valuable and will speed your recovery.  

## 2023-05-15 NOTE — Progress Notes (Signed)
I have spent 5 minutes in review of e-visit questionnaire, review and updating patient chart, medical decision making and response to patient.   Rehaan Viloria Cody Cherrie Franca, PA-C    

## 2023-05-29 ENCOUNTER — Encounter: Payer: Self-pay | Admitting: Women's Health

## 2023-05-29 ENCOUNTER — Ambulatory Visit: Payer: Medicaid Other | Admitting: Women's Health

## 2023-05-29 ENCOUNTER — Other Ambulatory Visit (HOSPITAL_COMMUNITY)
Admission: RE | Admit: 2023-05-29 | Discharge: 2023-05-29 | Disposition: A | Discharge status: Home / Self Care | Payer: Medicaid Other | Source: Ambulatory Visit | Attending: Obstetrics & Gynecology | Admitting: Obstetrics & Gynecology

## 2023-05-29 VITALS — BP 127/86 | HR 83 | Ht 59.0 in | Wt 251.6 lb

## 2023-05-29 DIAGNOSIS — N923 Ovulation bleeding: Secondary | ICD-10-CM

## 2023-05-29 DIAGNOSIS — N921 Excessive and frequent menstruation with irregular cycle: Secondary | ICD-10-CM

## 2023-05-29 DIAGNOSIS — R102 Pelvic and perineal pain: Secondary | ICD-10-CM

## 2023-05-29 NOTE — Progress Notes (Signed)
GYN VISIT Patient name: Tara Mcmahon MRN [id number]  Date of birth: 1996-10-23 Chief Complaint:   Follow-up (Took megace for 1 month.doctor hope period reset, but period still lasting over two week but spotting)  History of Present Illness:   Tara Mcmahon is a 27 y.o. [phone] Caucasian female being seen today for irregular bleeding. Bilateral salpingectomy 11/23/22, Liletta IUD removed 12/12/22 per her request, then bled heavy x 3wks, megace stopped it. Bled heavy again 5/30-6/15, and passed big 'weird looking clot'-has picture (was decidual cast). Spotting and cramping w/ sex and w/o. Denies abnormal discharge, itching/odor/irritation.  Wants progesterone checked b/c heard can have a progesterone deficiency after having IUD removed. Mom has h/o fibroids.  Patient's last menstrual period was 04/27/2023. The current method of family planning is tubal ligation.  Last pap 06/10/21. Results were: NILM w/ HRHPV negative     06/10/2021    4:06 PM 06/05/2020   11:26 AM 02/25/2020    3:56 PM 12/13/2017   11:18 AM 06/23/2017   10:52 AM  Depression screen PHQ 2/9  Decreased Interest 0 1 0 1 0  Down, Depressed, Hopeless 1 0 0 1 0  PHQ - 2 Score 1 1 0 2 0  Altered sleeping 1 1  0   Tired, decreased energy 1 1 1 1    Change in appetite 0 0 0 0   Feeling bad or failure about yourself  0 0 0 0   Trouble concentrating 0 0 0 0   Moving slowly or fidgety/restless 0 0 0 1   Suicidal thoughts 0 0 0 0   PHQ-9 Score 3 3  4    Difficult doing work/chores  Not difficult at all Not difficult at all Not difficult at all         06/10/2021    4:06 PM 06/05/2020   11:27 AM 02/25/2020    3:57 PM  GAD 7 : Generalized Anxiety Score  Nervous, Anxious, on Edge 1 1 1   Control/stop worrying 0 1 1  Worry too much - different things 0 1 1  Trouble relaxing 1 1 1   Restless 0 0 0  Easily annoyed or irritable 1 0 0  Afraid - awful might happen 0 0 1  Total GAD 7 Score 3 4 5   Anxiety Difficulty  Somewhat  difficult      Review of Systems:   Pertinent items are noted in HPI Denies fever/chills, dizziness, headaches, visual disturbances, fatigue, shortness of breath, chest pain, abdominal pain, vomiting, abnormal vaginal discharge/itching/odor/irritation, problems with periods, bowel movements, urination, or intercourse unless otherwise stated above.  Pertinent History Reviewed:  Reviewed past medical,surgical, social, obstetrical and family history.  Reviewed problem list, medications and allergies. Physical Assessment:   Vitals:   05/29/23 1331  BP: 127/86  Pulse: 83  Weight: 251 lb 9.6 oz (114.1 kg)  Height: 4\' 11"  (1.499 m)  Body mass index is 50.82 kg/m.       Physical Examination:   General appearance: alert, well appearing, and in no distress  Mental status: alert, oriented to person, place, and time  Skin: warm & dry   Cardiovascular: normal heart rate noted  Respiratory: normal respiratory effort, no distress  Abdomen: soft, non-tender   Pelvic: VULVA: normal appearing vulva with no masses, tenderness or lesions, VAGINA: normal appearing vagina with normal color and discharge, no lesions, menstrual type discharge CERVIX: normal appearing cervix without discharge or lesions, UTERUS: exam limited by body habitus, no abnormalities ADNEXA: exam limited  by body habitus, no abnormalities  Extremities: no edema   Chaperone: Peggy Dones    No results found for this or any previous visit (from the past 24 hour(s)).  Assessment & Plan:  1) Irregular heavy periods w/ intermenstrual spotting and cramping> CV swab, if neg will get pelvic u/s. Pt requests progesterone be checked for progesterone deficiency s/p IUD removal- discussed this level fluctuates throughout month dependent on cycle  Meds: No orders of the defined types were placed in this encounter.   Orders Placed This Encounter  Procedures   Progesterone    Return for will notify of results via MyChart.  Cheral Marker CNM, [facility] 05/29/2023 2:11 PM

## 2023-05-30 LAB — PROGESTERONE: Progesterone: 0.3 ng/mL

## 2023-05-31 LAB — CERVICOVAGINAL ANCILLARY ONLY
Bacterial Vaginitis (gardnerella): NEGATIVE
Candida Glabrata: NEGATIVE
Candida Vaginitis: NEGATIVE
Chlamydia: NEGATIVE
Comment: NEGATIVE
Comment: NEGATIVE
Comment: NEGATIVE
Comment: NEGATIVE
Comment: NEGATIVE
Comment: NORMAL
Neisseria Gonorrhea: NEGATIVE
Trichomonas: NEGATIVE

## 2023-06-06 ENCOUNTER — Ambulatory Visit: Payer: Medicaid Other | Admitting: Obstetrics & Gynecology

## 2024-01-26 ENCOUNTER — Ambulatory Visit: Payer: Medicaid Other | Admitting: Obstetrics & Gynecology

## 2024-03-06 ENCOUNTER — Encounter: Payer: Self-pay | Admitting: Obstetrics & Gynecology

## 2024-03-08 ENCOUNTER — Ambulatory Visit: Payer: Medicaid Other | Admitting: Obstetrics & Gynecology

## 2024-03-22 ENCOUNTER — Ambulatory Visit: Admitting: Obstetrics and Gynecology

## 2024-03-22 ENCOUNTER — Encounter: Payer: Self-pay | Admitting: Obstetrics and Gynecology

## 2024-03-22 ENCOUNTER — Other Ambulatory Visit (HOSPITAL_COMMUNITY)
Admission: RE | Admit: 2024-03-22 | Discharge: 2024-03-22 | Disposition: A | Discharge status: Home / Self Care | Source: Ambulatory Visit | Attending: Obstetrics and Gynecology | Admitting: Obstetrics and Gynecology

## 2024-03-22 VITALS — BP 129/87 | HR 99 | Ht 59.0 in | Wt 261.0 lb

## 2024-03-22 DIAGNOSIS — E282 Polycystic ovarian syndrome: Secondary | ICD-10-CM

## 2024-03-22 DIAGNOSIS — L0292 Furuncle, unspecified: Secondary | ICD-10-CM

## 2024-03-22 DIAGNOSIS — Z124 Encounter for screening for malignant neoplasm of cervix: Secondary | ICD-10-CM | POA: Diagnosis present

## 2024-03-22 DIAGNOSIS — Z01419 Encounter for gynecological examination (general) (routine) without abnormal findings: Secondary | ICD-10-CM | POA: Diagnosis not present

## 2024-03-22 MED ORDER — CLINDAMYCIN PHOSPHATE 1 % EX SOLN: Freq: Two times a day (BID) | CUTANEOUS | 1 refills | Status: AC

## 2024-03-22 NOTE — Progress Notes (Signed)
 ANNUAL EXAM Patient name: Tara Mcmahon MRN [id number]  Date of birth: [birth year] Chief Complaint:   Gynecologic Exam (Last pap7-14-22 normal)  History of Present Illness:   Tara Mcmahon is a 28 y.o. [phone]  female being seen today for a routine annual exam.  Current complaints: annual exam s/p btl. Hx PCOS. Periods have changed after IUD removal, reports having heavy cycles , then prolonged,  now reports lighter than usual, 8-9 days.  Has had trouble losing weight after IUD removal. Reports she has always been active, used to weight lift, has been managing her diet as well. Desires A1c testing. Reports hx boils, has one one left abdomen that is resolving  No LMP recorded.   Upstream - 03/22/24 1016       Pregnancy Intention Screening   Does the patient want to become pregnant in the next year? No    Does the patient's partner want to become pregnant in the next year? No    Would the patient like to discuss contraceptive options today? No      Contraception Wrap Up   Current Method Female Sterilization    End Method Female Sterilization    Contraception Counseling Provided No            The pregnancy intention screening data noted above was reviewed. Potential methods of contraception were discussed. The patient elected to proceed with Female Sterilization.   Last pap 2025. Results were: NILM w/ HRHPV negative. H/O abnormal pap: yes Last mammogram: n/a. Results were: N/A. Family h/o breast cancer: yes grandmother, aunt Last colonoscopy: n/a. Results were: N/A. Family h/o colorectal cancer: no     03/22/2024   10:04 AM 06/10/2021    4:06 PM 06/05/2020   11:26 AM 02/25/2020    3:56 PM 12/13/2017   11:18 AM  Depression screen PHQ 2/9  Decreased Interest 1 0 1 0 1  Down, Depressed, Hopeless 1 1 0 0 1  PHQ - 2 Score 2 1 1  0 2  Altered sleeping 1 1 1   0  Tired, decreased energy 1 1 1 1 1   Change in appetite 0 0 0 0 0  Feeling bad or failure about yourself  0 0 0 0 0   Trouble concentrating 0 0 0 0 0  Moving slowly or fidgety/restless 0 0 0 0 1  Suicidal thoughts 0 0 0 0 0  PHQ-9 Score 4 3 3  4   Difficult doing work/chores   Not difficult at all Not difficult at all Not difficult at all        03/22/2024   10:04 AM 06/10/2021    4:06 PM 06/05/2020   11:27 AM 02/25/2020    3:57 PM  GAD 7 : Generalized Anxiety Score  Nervous, Anxious, on Edge 1 1 1 1   Control/stop worrying 0 0 1 1  Worry too much - different things 0 0 1 1  Trouble relaxing 1 1 1 1   Restless 0 0 0 0  Easily annoyed or irritable 0 1 0 0  Afraid - awful might happen 0 0 0 1  Total GAD 7 Score 2 3 4 5   Anxiety Difficulty   Somewhat difficult      Review of Systems:   Pertinent items are noted in HPI Denies any headaches, blurred vision, fatigue, shortness of breath, chest pain, abdominal pain, abnormal vaginal discharge/itching/odor/irritation, problems with periods, bowel movements, urination, or intercourse unless otherwise stated above. Pertinent History Reviewed:  Reviewed past medical,surgical, social  and family history.  Reviewed problem list, medications and allergies. Physical Assessment:   Vitals:   03/22/24 1001  BP: 129/87  Pulse: 99  Weight: 261 lb (118.4 kg)  Height: 4\' 11"  (1.499 m)  Body mass index is 52.72 kg/m.        Physical Examination:   General appearance - well appearing, and in no distress  Mental status - alert, oriented   Psych:  She has a normal mood and affect  Skin - warm and dry, normal color, evidence of healing boil, no drainage, mild erythema   Chest - effort normal, all lung fields clear to auscultation bilaterally  Heart - normal rate and regular rhythm  Neck:  midline trachea, no thyromegaly  Breasts - breasts appear normal, no suspicious masses, no skin or nipple changes or  axillary nodes  Abdomen - soft, nontender  Pelvic - VULVA: normal appearing vulva with no masses, tenderness or lesions  VAGINA: normal appearing vagina with  normal color and discharge, no lesions  CERVIX: normal appearing cervix without discharge or lesions, no CMT  Thin prep pap is done w HR HPV cotesting  UTERUS: uterus is felt to be normal size, shape, consistency and nontender   ADNEXA: No adnexal masses or tenderness noted.  Extremities:  No swelling or varicosities noted  Chaperone present for exam  Results for orders placed or performed in visit on 03/22/24 (from the past 24 hours)  HgB A1c   Collection Time: 03/22/24 10:57 AM  Result Value Ref Range   Hgb A1c MFr Bld 5.2 4.8 - 5.6 %   Est. average glucose Bld gHb Est-mCnc 103 mg/dL    Assessment & Plan:  1. Encounter for annual routine gynecological examination (Primary) Pap collected today  Encouraged breast exams  A1c today   2. Cervical cancer screening  - Cytology - PAP( [facility])  3. PCOS (polycystic ovarian syndrome) Discussed options for weight, has tried metformin previously without success. Discussed trial phentermine, no known HTN, continue nutritian and exercise efforts as well - HgB A1c  4. Boil Healing, trial clinda wash  - clindamycin (CLEOCIN T) 1 % external solution; Apply topically 2 (two) times daily.  Dispense: 30 mL; Refill: 1   Labs/procedures today:   Mammogram: @ 30yo, or sooner if problems Colonoscopy: @ 30yo, or sooner if problems  Orders Placed This Encounter  Procedures   HgB A1c    Meds:  Meds ordered this encounter  Medications   clindamycin (CLEOCIN T) 1 % external solution    Sig: Apply topically 2 (two) times daily.    Dispense:  30 mL    Refill:  1    Follow-up: Return in about 1 year (around 03/22/2025) for Zoanne Hinders, FNP

## 2024-03-23 LAB — HEMOGLOBIN A1C
Est. average glucose Bld gHb Est-mCnc: 103 mg/dL
Hgb A1c MFr Bld: 5.2 % (ref 4.8–5.6)

## 2024-03-31 LAB — CYTOLOGY - PAP
Comment: NEGATIVE
Diagnosis: NEGATIVE
High risk HPV: NEGATIVE

## 2024-04-03 ENCOUNTER — Encounter: Payer: Self-pay | Admitting: Physician Assistant
